# Patient Record
Sex: Female | Born: 1935 | Race: White | Hispanic: No | Smoking: Never smoker
Health system: Southern US, Community
[De-identification: ages and names within clinical notes are randomized; demographics above are authoritative.]

## PROBLEM LIST (undated history)

## (undated) DIAGNOSIS — I10 Essential (primary) hypertension: Secondary | ICD-10-CM

## (undated) DIAGNOSIS — C50919 Malignant neoplasm of unspecified site of unspecified female breast: Secondary | ICD-10-CM

## (undated) DIAGNOSIS — F329 Major depressive disorder, single episode, unspecified: Secondary | ICD-10-CM

## (undated) DIAGNOSIS — E049 Nontoxic goiter, unspecified: Secondary | ICD-10-CM

## (undated) DIAGNOSIS — K219 Gastro-esophageal reflux disease without esophagitis: Secondary | ICD-10-CM

## (undated) DIAGNOSIS — F32A Depression, unspecified: Secondary | ICD-10-CM

## (undated) DIAGNOSIS — M1990 Unspecified osteoarthritis, unspecified site: Secondary | ICD-10-CM

## (undated) DIAGNOSIS — I4891 Unspecified atrial fibrillation: Secondary | ICD-10-CM

## (undated) DIAGNOSIS — N952 Postmenopausal atrophic vaginitis: Secondary | ICD-10-CM

## (undated) DIAGNOSIS — N813 Complete uterovaginal prolapse: Secondary | ICD-10-CM

## (undated) DIAGNOSIS — G459 Transient cerebral ischemic attack, unspecified: Secondary | ICD-10-CM

## (undated) DIAGNOSIS — N8110 Cystocele, unspecified: Secondary | ICD-10-CM

## (undated) DIAGNOSIS — E785 Hyperlipidemia, unspecified: Secondary | ICD-10-CM

## (undated) DIAGNOSIS — E039 Hypothyroidism, unspecified: Secondary | ICD-10-CM

## (undated) DIAGNOSIS — N390 Urinary tract infection, site not specified: Secondary | ICD-10-CM

## (undated) DIAGNOSIS — IMO0001 Reserved for inherently not codable concepts without codable children: Secondary | ICD-10-CM

## (undated) DIAGNOSIS — M810 Age-related osteoporosis without current pathological fracture: Secondary | ICD-10-CM

## (undated) DIAGNOSIS — T8859XA Other complications of anesthesia, initial encounter: Secondary | ICD-10-CM

## (undated) DIAGNOSIS — J189 Pneumonia, unspecified organism: Secondary | ICD-10-CM

## (undated) DIAGNOSIS — Z923 Personal history of irradiation: Secondary | ICD-10-CM

## (undated) DIAGNOSIS — Z803 Family history of malignant neoplasm of breast: Secondary | ICD-10-CM

## (undated) DIAGNOSIS — K635 Polyp of colon: Secondary | ICD-10-CM

## (undated) DIAGNOSIS — I499 Cardiac arrhythmia, unspecified: Secondary | ICD-10-CM

## (undated) DIAGNOSIS — IMO0002 Reserved for concepts with insufficient information to code with codable children: Secondary | ICD-10-CM

## (undated) HISTORY — PX: BREAST SURGERY: SHX581

## (undated) HISTORY — DX: Polyp of colon: K63.5

## (undated) HISTORY — PX: CATARACT EXTRACTION W/ INTRAOCULAR LENS  IMPLANT, BILATERAL: SHX1307

## (undated) HISTORY — DX: Hyperlipidemia, unspecified: E78.5

## (undated) HISTORY — DX: Transient cerebral ischemic attack, unspecified: G45.9

## (undated) HISTORY — PX: APPENDECTOMY: SHX54

## (undated) HISTORY — DX: Complete uterovaginal prolapse: N81.3

## (undated) HISTORY — DX: Reserved for concepts with insufficient information to code with codable children: IMO0002

## (undated) HISTORY — DX: Urinary tract infection, site not specified: N39.0

## (undated) HISTORY — DX: Nontoxic goiter, unspecified: E04.9

## (undated) HISTORY — DX: Gastro-esophageal reflux disease without esophagitis: K21.9

## (undated) HISTORY — DX: Postmenopausal atrophic vaginitis: N95.2

## (undated) HISTORY — DX: Reserved for inherently not codable concepts without codable children: IMO0001

## (undated) HISTORY — DX: Cystocele, unspecified: N81.10

## (undated) HISTORY — PX: EYE SURGERY: SHX253

## (undated) HISTORY — DX: Family history of malignant neoplasm of breast: Z80.3

## (undated) HISTORY — PX: DILATION AND CURETTAGE OF UTERUS: SHX78

## (undated) HISTORY — PX: COLONOSCOPY: SHX174

---
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
## 2004-09-14 ENCOUNTER — Ambulatory Visit: Payer: Self-pay | Admitting: Internal Medicine

## 2004-10-17 DIAGNOSIS — Z923 Personal history of irradiation: Secondary | ICD-10-CM

## 2004-10-17 DIAGNOSIS — C50919 Malignant neoplasm of unspecified site of unspecified female breast: Secondary | ICD-10-CM

## 2004-10-17 HISTORY — DX: Malignant neoplasm of unspecified site of unspecified female breast: C50.919

## 2004-10-17 HISTORY — PX: BREAST BIOPSY: SHX20

## 2004-10-17 HISTORY — PX: BREAST LUMPECTOMY: SHX2

## 2004-10-17 HISTORY — DX: Personal history of irradiation: Z92.3

## 2004-12-15 ENCOUNTER — Ambulatory Visit: Payer: Self-pay | Admitting: Unknown Physician Specialty

## 2005-07-06 ENCOUNTER — Ambulatory Visit: Payer: Self-pay | Admitting: Internal Medicine

## 2005-07-14 ENCOUNTER — Ambulatory Visit: Payer: Self-pay | Admitting: Internal Medicine

## 2005-08-18 ENCOUNTER — Ambulatory Visit: Payer: Self-pay | Admitting: Internal Medicine

## 2005-09-06 ENCOUNTER — Ambulatory Visit: Payer: Self-pay | Admitting: Internal Medicine

## 2005-10-12 ENCOUNTER — Ambulatory Visit: Payer: Self-pay | Admitting: General Surgery

## 2005-10-27 ENCOUNTER — Ambulatory Visit: Payer: Self-pay | Admitting: Oncology

## 2005-11-02 ENCOUNTER — Ambulatory Visit: Payer: Self-pay | Admitting: General Surgery

## 2005-11-17 ENCOUNTER — Ambulatory Visit: Payer: Self-pay | Admitting: Radiation Oncology

## 2005-12-15 ENCOUNTER — Ambulatory Visit: Payer: Self-pay | Admitting: Radiation Oncology

## 2006-01-15 ENCOUNTER — Ambulatory Visit: Payer: Self-pay | Admitting: Radiation Oncology

## 2006-02-14 ENCOUNTER — Ambulatory Visit: Payer: Self-pay | Admitting: Radiation Oncology

## 2006-04-07 ENCOUNTER — Ambulatory Visit: Payer: Self-pay | Admitting: Unknown Physician Specialty

## 2006-04-10 ENCOUNTER — Ambulatory Visit: Payer: Self-pay | Admitting: General Surgery

## 2006-04-18 ENCOUNTER — Ambulatory Visit: Payer: Self-pay | Admitting: Unknown Physician Specialty

## 2006-04-26 ENCOUNTER — Ambulatory Visit: Payer: Self-pay | Admitting: Oncology

## 2006-07-05 ENCOUNTER — Ambulatory Visit: Payer: Self-pay | Admitting: Radiation Oncology

## 2006-09-22 ENCOUNTER — Ambulatory Visit: Payer: Self-pay | Admitting: Oncology

## 2006-09-29 ENCOUNTER — Ambulatory Visit: Payer: Self-pay | Admitting: Oncology

## 2006-10-17 ENCOUNTER — Ambulatory Visit: Payer: Self-pay | Admitting: Oncology

## 2006-11-17 ENCOUNTER — Ambulatory Visit: Payer: Self-pay | Admitting: Oncology

## 2007-03-18 ENCOUNTER — Ambulatory Visit: Payer: Self-pay | Admitting: Oncology

## 2007-03-23 ENCOUNTER — Ambulatory Visit: Payer: Self-pay | Admitting: General Surgery

## 2007-04-11 ENCOUNTER — Ambulatory Visit: Payer: Self-pay | Admitting: Oncology

## 2007-04-17 ENCOUNTER — Ambulatory Visit: Payer: Self-pay | Admitting: Oncology

## 2007-09-17 ENCOUNTER — Ambulatory Visit: Payer: Self-pay | Admitting: Oncology

## 2007-09-24 ENCOUNTER — Ambulatory Visit: Payer: Self-pay | Admitting: Oncology

## 2007-09-27 ENCOUNTER — Ambulatory Visit: Payer: Self-pay | Admitting: Oncology

## 2007-10-18 ENCOUNTER — Ambulatory Visit: Payer: Self-pay | Admitting: Oncology

## 2007-11-18 ENCOUNTER — Ambulatory Visit: Payer: Self-pay | Admitting: Oncology

## 2008-01-16 ENCOUNTER — Ambulatory Visit: Payer: Self-pay | Admitting: Oncology

## 2008-02-15 ENCOUNTER — Ambulatory Visit: Payer: Self-pay | Admitting: Oncology

## 2008-03-17 ENCOUNTER — Ambulatory Visit: Payer: Self-pay | Admitting: Oncology

## 2008-04-03 ENCOUNTER — Ambulatory Visit: Payer: Self-pay | Admitting: Oncology

## 2008-04-16 ENCOUNTER — Ambulatory Visit: Payer: Self-pay | Admitting: Oncology

## 2008-08-17 ENCOUNTER — Ambulatory Visit: Payer: Self-pay | Admitting: Oncology

## 2008-09-16 ENCOUNTER — Ambulatory Visit: Payer: Self-pay | Admitting: Oncology

## 2008-09-22 ENCOUNTER — Ambulatory Visit: Payer: Self-pay | Admitting: General Surgery

## 2008-10-02 ENCOUNTER — Ambulatory Visit: Payer: Self-pay | Admitting: Oncology

## 2008-10-17 ENCOUNTER — Ambulatory Visit: Payer: Self-pay | Admitting: Oncology

## 2009-01-12 ENCOUNTER — Ambulatory Visit: Payer: Self-pay | Admitting: Internal Medicine

## 2009-02-18 ENCOUNTER — Ambulatory Visit: Payer: Self-pay | Admitting: Unknown Physician Specialty

## 2009-03-17 ENCOUNTER — Ambulatory Visit: Payer: Self-pay | Admitting: Oncology

## 2009-04-06 ENCOUNTER — Ambulatory Visit: Payer: Self-pay | Admitting: Oncology

## 2009-04-16 ENCOUNTER — Ambulatory Visit: Payer: Self-pay | Admitting: Oncology

## 2009-09-16 ENCOUNTER — Ambulatory Visit: Payer: Self-pay | Admitting: Oncology

## 2009-09-23 ENCOUNTER — Ambulatory Visit: Payer: Self-pay | Admitting: Internal Medicine

## 2009-09-28 ENCOUNTER — Ambulatory Visit: Payer: Self-pay | Admitting: Oncology

## 2009-10-17 ENCOUNTER — Ambulatory Visit: Payer: Self-pay | Admitting: Oncology

## 2010-03-17 ENCOUNTER — Ambulatory Visit: Payer: Self-pay | Admitting: Oncology

## 2010-03-22 ENCOUNTER — Ambulatory Visit: Payer: Self-pay | Admitting: Oncology

## 2010-04-16 ENCOUNTER — Ambulatory Visit: Payer: Self-pay | Admitting: Oncology

## 2010-09-27 ENCOUNTER — Ambulatory Visit: Payer: Self-pay | Admitting: Internal Medicine

## 2010-12-20 ENCOUNTER — Ambulatory Visit: Payer: Self-pay | Admitting: Oncology

## 2011-01-16 ENCOUNTER — Ambulatory Visit: Payer: Self-pay | Admitting: Oncology

## 2011-07-21 ENCOUNTER — Ambulatory Visit: Payer: Self-pay | Admitting: Oncology

## 2011-07-22 LAB — CANCER ANTIGEN 27.29: CA 27.29: <3.5 U/mL (ref 0.0–38.6)

## 2011-08-18 ENCOUNTER — Ambulatory Visit: Payer: Self-pay | Admitting: Oncology

## 2011-10-31 ENCOUNTER — Ambulatory Visit: Payer: Self-pay | Admitting: Oncology

## 2011-11-15 ENCOUNTER — Ambulatory Visit: Payer: Self-pay | Admitting: Internal Medicine

## 2012-01-19 ENCOUNTER — Ambulatory Visit: Payer: Self-pay | Admitting: Oncology

## 2012-01-19 LAB — COMPREHENSIVE METABOLIC PANEL
Albumin: 3.5 g/dL (ref 3.4–5.0)
Alkaline Phosphatase: 89 U/L (ref 50–136)
BUN: 14 mg/dL (ref 7–18)
Bilirubin,Total: 0.3 mg/dL (ref 0.2–1.0)
Calcium, Total: 8.6 mg/dL (ref 8.5–10.1)
Creatinine: 1.01 mg/dL (ref 0.60–1.30)
EGFR (African American): >60
EGFR (Non-African Amer.): 56 — ABNORMAL LOW
Osmolality: 284 (ref 275–301)
Potassium: 4.5 mmol/L (ref 3.5–5.1)
SGOT(AST): 14 U/L — ABNORMAL LOW (ref 15–37)
SGPT (ALT): 20 U/L
Sodium: 143 mmol/L (ref 136–145)
Total Protein: 7.2 g/dL (ref 6.4–8.2)

## 2012-01-19 LAB — CBC CANCER CENTER
Basophil %: 0.7 %
Eosinophil #: 0.3 x10 3/mm (ref 0.0–0.7)
Eosinophil %: 5.2 %
Lymphocyte #: 1.3 x10 3/mm (ref 1.0–3.6)
Lymphocyte %: 27.2 %
MCH: 32.4 pg (ref 26.0–34.0)
MCHC: 33.7 g/dL (ref 32.0–36.0)
RDW: 14.2 % (ref 11.5–14.5)
WBC: 4.8 x10 3/mm (ref 3.6–11.0)

## 2012-01-20 LAB — CANCER ANTIGEN 27.29: CA 27.29: 4.1 U/mL (ref 0.0–38.6)

## 2012-02-15 ENCOUNTER — Ambulatory Visit: Payer: Self-pay | Admitting: Oncology

## 2012-11-09 ENCOUNTER — Ambulatory Visit: Payer: Self-pay

## 2012-12-25 ENCOUNTER — Ambulatory Visit: Payer: Self-pay | Admitting: General Practice

## 2012-12-25 ENCOUNTER — Emergency Department: Payer: Self-pay | Admitting: Emergency Medicine

## 2013-04-14 ENCOUNTER — Observation Stay: Payer: Self-pay | Admitting: Internal Medicine

## 2013-04-14 LAB — CBC
HCT: 44.2 % (ref 35.0–47.0)
HGB: 14.9 g/dL (ref 12.0–16.0)
MCH: 31.6 pg (ref 26.0–34.0)
MCHC: 33.8 g/dL (ref 32.0–36.0)
MCV: 94 fL (ref 80–100)
Platelet: 207 10*3/uL (ref 150–440)
RBC: 4.72 10*6/uL (ref 3.80–5.20)
RDW: 14 % (ref 11.5–14.5)
WBC: 6.6 10*3/uL (ref 3.6–11.0)

## 2013-04-14 LAB — COMPREHENSIVE METABOLIC PANEL
Albumin: 3.4 g/dL (ref 3.4–5.0)
Alkaline Phosphatase: 93 U/L (ref 50–136)
Anion Gap: 5 — ABNORMAL LOW (ref 7–16)
BUN: 15 mg/dL (ref 7–18)
Bilirubin,Total: 0.4 mg/dL (ref 0.2–1.0)
Calcium, Total: 9.5 mg/dL (ref 8.5–10.1)
Chloride: 108 mmol/L — ABNORMAL HIGH (ref 98–107)
SGPT (ALT): 16 U/L (ref 12–78)

## 2013-04-14 LAB — PROTIME-INR
INR: 0.9
Prothrombin Time: 12.7 secs (ref 11.5–14.7)

## 2013-04-14 LAB — TROPONIN I: Troponin-I: <0.02 ng/mL

## 2013-04-14 LAB — APTT: Activated PTT: 31.7 secs (ref 23.6–35.9)

## 2013-04-14 LAB — MAGNESIUM: Magnesium: 1.7 mg/dL — ABNORMAL LOW

## 2013-04-15 LAB — CBC WITH DIFFERENTIAL/PLATELET
Basophil #: 0.1 10*3/uL (ref 0.0–0.1)
Eosinophil %: 6.3 %
HGB: 11.9 g/dL — ABNORMAL LOW (ref 12.0–16.0)
Lymphocyte %: 42.5 %
MCH: 31.2 pg (ref 26.0–34.0)
MCV: 94 fL (ref 80–100)
Monocyte #: 0.4 x10 3/mm (ref 0.2–0.9)
Neutrophil #: 2 10*3/uL (ref 1.4–6.5)
RBC: 3.8 10*6/uL (ref 3.80–5.20)
RDW: 14.4 % (ref 11.5–14.5)
WBC: 4.7 10*3/uL (ref 3.6–11.0)

## 2013-04-15 LAB — BASIC METABOLIC PANEL
Calcium, Total: 7.9 mg/dL — ABNORMAL LOW (ref 8.5–10.1)
Co2: 25 mmol/L (ref 21–32)
EGFR (African American): >60
Potassium: 4 mmol/L (ref 3.5–5.1)
Sodium: 145 mmol/L (ref 136–145)

## 2013-04-15 LAB — LIPID PANEL
Cholesterol: 137 mg/dL (ref 0–200)
HDL Cholesterol: 38 mg/dL — ABNORMAL LOW (ref 40–60)
Ldl Cholesterol, Calc: 82 mg/dL (ref 0–100)
Triglycerides: 85 mg/dL (ref 0–200)
VLDL Cholesterol, Calc: 17 mg/dL (ref 5–40)

## 2013-04-15 LAB — CK TOTAL AND CKMB (NOT AT ARMC): CK, Total: 30 U/L (ref 21–215)

## 2013-11-21 ENCOUNTER — Ambulatory Visit: Payer: Self-pay

## 2014-02-28 DIAGNOSIS — Z8719 Personal history of other diseases of the digestive system: Secondary | ICD-10-CM | POA: Insufficient documentation

## 2014-04-01 DIAGNOSIS — M810 Age-related osteoporosis without current pathological fracture: Secondary | ICD-10-CM | POA: Insufficient documentation

## 2014-04-01 DIAGNOSIS — E010 Iodine-deficiency related diffuse (endemic) goiter: Secondary | ICD-10-CM | POA: Insufficient documentation

## 2014-04-01 DIAGNOSIS — C50919 Malignant neoplasm of unspecified site of unspecified female breast: Secondary | ICD-10-CM | POA: Insufficient documentation

## 2014-04-01 DIAGNOSIS — K219 Gastro-esophageal reflux disease without esophagitis: Secondary | ICD-10-CM | POA: Insufficient documentation

## 2014-04-01 DIAGNOSIS — E785 Hyperlipidemia, unspecified: Secondary | ICD-10-CM | POA: Insufficient documentation

## 2014-04-01 DIAGNOSIS — G939 Disorder of brain, unspecified: Secondary | ICD-10-CM | POA: Insufficient documentation

## 2014-04-01 DIAGNOSIS — I6782 Cerebral ischemia: Secondary | ICD-10-CM | POA: Insufficient documentation

## 2014-04-03 ENCOUNTER — Ambulatory Visit: Payer: Self-pay | Admitting: Unknown Physician Specialty

## 2014-04-08 LAB — PATHOLOGY REPORT

## 2014-11-24 ENCOUNTER — Ambulatory Visit: Payer: Self-pay | Admitting: Internal Medicine

## 2015-02-06 NOTE — H&P (Signed)
PATIENT NAME:  Sheryl Michael, Sheryl Michael MR#:  [id number] DATE OF BIRTH:  [birth year]  DATE OF ADMISSION:  04/14/2013  REFERRING PHYSICIAN:  Dr. Renard Hamper.    PRIMARY CARE PHYSICIAN: Dr.  Ola Spurr.   CHIEF COMPLAINT: Irregular heart rhythm.  HISTORY OF PRESENT ILLNESS:  The patient is a pleasant 81 year old female with hypothyroidism, who presents with above chief complaint. The patient stated that couple of weeks ago she had bronchitis symptoms with runny nose, itchy eyes, watery and productive cough and was on antibiotics for a week or so. Most of the symptoms are gone, but she still has a cough with occasional whitish sputum. She was doing fine until last night, where she developed a racy heart with palpitations and some chest pressure. The pressure did not radiate. The symptoms persisted and this morning, she came in. She was noted to be in atrial flutter with RVR, rate of 130s with variable AV block and some PVCs. The patient was given diltiazem 10 mg IV x 1 and then required another 5 mg of IV x 1 and  drip was ordered, but the patient converted to normal sinus rhythm. The patient has  no chest pressure. Of note, the patient also had positive for D-dimer and had a CT PE protocol done, which was negative. Hospitalist services were contacted for further evaluation and management. Her heart rate in the high 60s and low 70s and pressures were 110s.   PAST MEDICAL HISTORY: Hypothyroidism, history of breast cancer in remission.   PAST SURGICAL HISTORY: Hysterectomy.   FAMILY HISTORY: Breast cancer, a sister with myocardial infarction, another sister with diabetes and cancer.   SOCIAL HISTORY: No tobacco, alcohol or drug use.   OUTPATIENT MEDICATIONS: She takes Synthroid 50 mcg daily, Caltrate 600+ D, 1 tablet once a day.   ALLERGIES: CODEINE AND PENICILLIN.   REVIEW OF SYSTEMS:  CONSTITUTIONAL: No fever, had some fatigue and weakness earlier this morning.  EYES: No blurry vision or double vision.  ENT: No  tinnitus, hearing loss or discharge.  RESPIRATORY: Positive for a cough, which is biting with some whitish sputum at times. No wheezing and had some shortness of breath today.  CARDIOVASCULAR: Had chest  pressure. No orthopnea. Had a bout of possible palpitations three weeks ago, this resolve. No high blood pressure. No abdominal pain.  GASTROINTESTINAL: No nausea, vomiting, diarrhea, hematemesis, melena or ulcers.  GENITOURINARY: Denies dysuria, hematuria, or frequency.   HEMATOLOGY/LYMPHATICS: No anemia or easy bruising.  SKIN: No rashes.  MUSCULOSKELETAL: Denies arthritis or gout.  [place]:  Denies numbness or weakness. Has history of transient ischemic attack last year while she was vacationing in [place].  PSYCHIATRIC: No anxiety or insomnia.   PHYSICAL EXAMINATION:  VITAL SIGNS:  Temperature on arrival was not recorded, but pulse rate was 138, respiratory rate 22, blood pressure 127/62, oxygen saturation 96% on room air.  GENERAL: The patient is a Caucasian female lying in bed in no obvious distress, talking in full sentences.  HEENT: Normocephalic, atraumatic. Pupils are equal and reactive. Anicteric sclerae. Extraocular muscles intact. Moist mucous membranes.  NECK: Supple. No thyroid tenderness. No cervical lymphadenopathy. No JVD.  CARDIOVASCULAR: S1, S2 irregularly irregular. No murmurs, rubs or gallops.  LUNGS: Clear to auscultation without wheezing, rhonchi or rales.  ABDOMEN: Soft, nontender, nondistended. Positive bowel sounds in all quadrants.  EXTREMITIES: No significant lower extremity edema, but there are some varicosities bilateral lower extremities.  SKIN: No obvious rashes.   NEUROLOGIC: Cranial nerves II through XII grossly intact. Strength  is 5/5 in all extremities.  Sensation intact to light touch.  PSYCHIATRIC: Awake, alert, oriented x 3. Pleasant, cooperative.   LABORATORY AND RADIOLOGICAL DATA: D-dimer positive at 1.51, INR 0.9. WBCs at 6.6, hemoglobin 14.9,  platelets 207, TSH 2.5. Troponin negative. LFTs within normal limits. Magnesium 1.7, BUN 15, creatinine 1.02, sodium 142, potassium 4.   First EKG as above with a rate of 136 some nonspecific interventricular block. A second EKG after patient converted shows sinus tach, and under monitor, she was a normal sinus rhythm, rate is 71.   ASSESSMENT AND PLAN: We have a 81 year old female with a history of hypothyroidism with possible history of transient ischemic attack last year and an episode of palpitations about 3 weeks ago, presents with atrial flutter with rapid ventricular response, currently back to sinus rhythm. The patient likely has paroxysmal flutter/fibrillation. She has possibly experienced a transient ischemic attack last year involving the right upper extremity, which initially she did not state. The patient did convert with diltiazem IV x 2 doses and at this point, sinus rate 60s to 70s and blood pressure in the 110s. Admit her to telemetry for observation as she has converted. Given the heart rate on the lower side as well with blood pressure on lower side, I would consider adding low-dose beta blocker, if it tolerates later. I would start the patient on a full dose aspirin as patient deferred full anticoagulation at this point. I urged her that she had a history of transient ischemic attack in the past as well as palpitations, a few weeks ago, which could have been flutter/fibrillation slash. However, she will think about it. At this point, I would start the patient on a full dose aspirin and consider beta blocker lower dose  if her blood pressure tolerates. Obtain serial echocardiograms obtain serial EKGs and obtain a cardiology consult. Her TSH is within normal limits. She had a positive D-dimer, but has no pulmonary embolus per pulmonary embolus protocol CT. She has had a bout of bronchitis, which is biting and I do not think that is the cause. Her magnesium is on the lower side, and it has been  repleted. I will check in the morning. But continue the TSH, start her on heparin for deep vein thrombosis prophylaxis.   CODE STATUS:  The patient is a full code.   TOTAL TIME SPENT: 45 minutes.     ____________________________ Vivien Presto, MD sa:cc D: 04/14/2013 14:01:29 ET T: 04/14/2013 15:41:47 ET JOB#: [id number]  cc: Vivien Presto, MD, <Dictator> Cheral Marker. Ola Spurr, MD Karel Jarvis [facility] MD ELECTRONICALLY SIGNED 04/26/2013 15:05

## 2015-02-06 NOTE — Discharge Summary (Signed)
PATIENT NAME:  Sheryl Michael, Sheryl Michael MR#:  [id number] DATE OF BIRTH:  03/24/1931  DATE OF ADMISSION:  04/14/2013 DATE OF DISCHARGE:  04/15/2013  DISCHARGE DIAGNOSES: 1.  Atrial flutter.  2.  Hypothyroidism.  3.  History of breast cancer.   DISCHARGE MEDICATIONS: 1.  Synthroid 50 mcg daily.  2.  Aspirin 81 mg daily.  3.  Bisoprolol 2.5 mg daily.   REASON FOR ADMISSION: A 81 year old female who presents with a-flutter. Please see H and P for HPI, past medical history and physical exam.   HOSPITAL COURSE: The patient was admitted, converted into normal sinus rhythm with diltiazem IV x 1. She remained in normal rhythm. She is asymptomatic.  Chest CT negative for PE. Cardiac enzymes normal. It appears that she got volume depleted while working out in the garden and that is likely what triggered it. She has had this in the past. She will be on bisoprolol for now.  Ultimately could wean that off.  Taught Valsalva maneuver.  Echo pending. She has no interested in Coumadin or other anticoagulant therapy and thus agrees to take an aspirin daily. Follow up with Dr. Ola Spurr in 2 weeks.  ____________________________ Rusty Aus, MD mfm:sb D: 04/15/2013 07:37:17 ET T: 04/15/2013 08:05:50 ET JOB#: [id number]  cc: Rusty Aus, MD, <Dictator> Cyler Kappes Roselee Culver MD ELECTRONICALLY SIGNED 04/15/2013 8:15

## 2015-08-17 DIAGNOSIS — Z87898 Personal history of other specified conditions: Secondary | ICD-10-CM | POA: Insufficient documentation

## 2015-08-17 DIAGNOSIS — Z85828 Personal history of other malignant neoplasm of skin: Secondary | ICD-10-CM | POA: Insufficient documentation

## 2015-10-07 ENCOUNTER — Encounter: Payer: Self-pay | Admitting: Obstetrics and Gynecology

## 2015-10-07 ENCOUNTER — Ambulatory Visit (INDEPENDENT_AMBULATORY_CARE_PROVIDER_SITE_OTHER): Payer: Medicare Other | Admitting: Obstetrics and Gynecology

## 2015-10-07 VITALS — BP 90/53 | HR 80 | Ht 60.0 in | Wt 116.7 lb

## 2015-10-07 DIAGNOSIS — K222 Esophageal obstruction: Secondary | ICD-10-CM | POA: Insufficient documentation

## 2015-10-07 DIAGNOSIS — K635 Polyp of colon: Secondary | ICD-10-CM | POA: Insufficient documentation

## 2015-10-07 DIAGNOSIS — N813 Complete uterovaginal prolapse: Secondary | ICD-10-CM

## 2015-10-07 DIAGNOSIS — N8110 Cystocele, unspecified: Secondary | ICD-10-CM | POA: Diagnosis not present

## 2015-10-07 DIAGNOSIS — IMO0002 Reserved for concepts with insufficient information to code with codable children: Secondary | ICD-10-CM

## 2015-10-07 DIAGNOSIS — Q393 Congenital stenosis and stricture of esophagus: Secondary | ICD-10-CM | POA: Insufficient documentation

## 2015-10-07 NOTE — Progress Notes (Signed)
Chief complaint: 1.  Pelvic organ prolapse.  The patient is an 81 year old white female, menopausal, using no hormone replacement therapy, para 3003, status post spontaneous vaginal delivery 3, who has known third-degree cystocele and uterine procidentia.  Last year.  Pessary trial was attempted but unsuccessful; 2 done at pessaries and a #3 ring with support were not able to be maintained.  Gellhorn pessary was attempted as well, but was not successful due to patient discomfort and intolerance with insertion.  Patient is now interested in surgical repair if possible.  Surgical risk factors include history of TIAs/.  Patient has no significant cardiovascular disease.She does have a history of cancer.  Past medical history, past surgical history, problem list, medications, and allergies are reviewed.  OBJECTIVE: BP 90/53 mmHg  Pulse 80  Ht 5' (1.524 m)  Wt 116 lb 11.2 oz (52.935 kg)  BMI 22.79 kg/m2 Pleasant white female in no distress.  Alert and oriented. Abdomen: Soft, nontender, without organomegaly. Pelvic exam: External genitalia-normal BUS-normal. Vagina-third degree cystocele; no rectocele. Cervix-no motion tenderness; third-degree prolapse. Uterus-small, mobile, nontender, mid position, procidentia noted with Valsalva. Bimanual-no adnexal masses or tenderness. Rectovaginal-external exam normal.  ASSESSMENT: 1.  Uterine procidentia. 2.  Cystocele, third-degree. 3.  Failed extensive pessary trial.  PLAN: 1.  Recommend [facility] BSO with anterior colporrhaphy. 2.  Surgical comorbidity Risks were reviewed, Including DVT, section, cardiovascular risks, general anesthesia risks with neurologic function, etc.  Multiple questions were answered.  Patient understands she may have an option of having general anesthesia versus spinal. 3.  Patient will discuss surgery with family and will contact us if she would desires to proceed with surgical intervention.  Alanda Slim Josphine Laffey, MD  A  total of 25 minutes were spent face-to-face with the patient during this encounter and over half of that time involved counseling and coordination of care.  Note: This dictation was prepared with Dragon dictation along with smaller phrase technology. Any transcriptional errors that result from this process are unintentional.

## 2015-10-07 NOTE — Patient Instructions (Signed)
1.  Patient is to call us when she would like to schedule surgery.  Recommendation is for [facility] BSO with anterior colporrhaphy.  No mesh will be used in the surgical repair.

## 2015-10-20 ENCOUNTER — Telehealth: Payer: Self-pay | Admitting: Obstetrics and Gynecology

## 2015-10-20 NOTE — Telephone Encounter (Signed)
Patient called wanting to speak with you regarding something she states she forgot to discuss something at her last appointment. She can be reached at [phone]

## 2015-10-21 NOTE — Telephone Encounter (Signed)
Pt wanted her daughter Georgeanna Lea) to contact office with  Several questions about her surgery. Advised pt I would make a note in her chart that it was ok to discuss her information with daughter. Neoma Laming will call me back with her questions. Will send to mad if I can't answer.

## 2015-11-10 ENCOUNTER — Telehealth: Payer: Self-pay | Admitting: Obstetrics and Gynecology

## 2015-11-10 NOTE — Telephone Encounter (Signed)
Her mother is supposed to be having surgery and her daughter wants to ask some questions. She is coming to stay with her sister and wanted to ask a few things. She is having a hysterectomy and bladder sling.

## 2015-11-11 ENCOUNTER — Telehealth: Payer: Self-pay | Admitting: Obstetrics and Gynecology

## 2015-11-11 ENCOUNTER — Other Ambulatory Visit: Payer: Self-pay | Admitting: Internal Medicine

## 2015-11-11 DIAGNOSIS — Z1231 Encounter for screening mammogram for malignant neoplasm of breast: Secondary | ICD-10-CM

## 2015-11-11 NOTE — Telephone Encounter (Signed)
Pt said she was ready to set her surgery up and wanted to talk to you first.

## 2015-11-11 NOTE — Telephone Encounter (Signed)
Answers daughters questions. Advised to contact Kristal Cox to set up surgery date.

## 2015-11-17 ENCOUNTER — Telehealth: Payer: Self-pay | Admitting: Obstetrics and Gynecology

## 2015-11-17 NOTE — Telephone Encounter (Signed)
PT CALLED AND SHE IS REQUESTING A CALL BACK FROM YOU SHE WOULD LIKE TO TALK TO YOU ABOUT SOMETHING.

## 2015-11-18 NOTE — Telephone Encounter (Signed)
Pt wants to confirm her surgery is scheduled for 12/21/15- her daughter lives on [address] and she is booking her flight. Aware I will send message to [facility] to confirm.

## 2015-11-19 NOTE — Telephone Encounter (Signed)
Spoke with patient and gave her surgery date as well as pre op date and time. K[facility]

## 2015-11-26 ENCOUNTER — Other Ambulatory Visit: Payer: Self-pay | Admitting: Internal Medicine

## 2015-11-26 ENCOUNTER — Ambulatory Visit
Admission: RE | Admit: 2015-11-26 | Discharge: 2015-11-26 | Disposition: A | Discharge status: Home / Self Care | Payer: Medicare Other | Source: Ambulatory Visit | Attending: Internal Medicine | Admitting: Internal Medicine

## 2015-11-26 DIAGNOSIS — Z1231 Encounter for screening mammogram for malignant neoplasm of breast: Secondary | ICD-10-CM

## 2015-11-26 HISTORY — DX: Malignant neoplasm of unspecified site of unspecified female breast: C50.919

## 2015-12-15 ENCOUNTER — Encounter
Admission: RE | Admit: 2015-12-15 | Discharge: 2015-12-15 | Disposition: A | Discharge status: Home / Self Care | Payer: Medicare Other | Source: Ambulatory Visit | Attending: Obstetrics and Gynecology | Admitting: Obstetrics and Gynecology

## 2015-12-15 ENCOUNTER — Encounter: Payer: Self-pay | Admitting: Obstetrics and Gynecology

## 2015-12-15 ENCOUNTER — Ambulatory Visit (INDEPENDENT_AMBULATORY_CARE_PROVIDER_SITE_OTHER): Payer: Medicare Other | Admitting: Obstetrics and Gynecology

## 2015-12-15 VITALS — BP 139/62 | HR 57 | Ht 61.0 in | Wt 121.8 lb

## 2015-12-15 DIAGNOSIS — Z01818 Encounter for other preprocedural examination: Secondary | ICD-10-CM

## 2015-12-15 DIAGNOSIS — Z01810 Encounter for preprocedural cardiovascular examination: Secondary | ICD-10-CM | POA: Insufficient documentation

## 2015-12-15 DIAGNOSIS — IMO0002 Reserved for concepts with insufficient information to code with codable children: Secondary | ICD-10-CM

## 2015-12-15 DIAGNOSIS — N813 Complete uterovaginal prolapse: Secondary | ICD-10-CM

## 2015-12-15 DIAGNOSIS — Z01812 Encounter for preprocedural laboratory examination: Secondary | ICD-10-CM | POA: Insufficient documentation

## 2015-12-15 DIAGNOSIS — N8110 Cystocele, unspecified: Secondary | ICD-10-CM

## 2015-12-15 HISTORY — DX: Major depressive disorder, single episode, unspecified: F32.9

## 2015-12-15 HISTORY — DX: Depression, unspecified: F32.A

## 2015-12-15 LAB — ABO/RH: ABO/RH(D): B POS

## 2015-12-15 LAB — CBC WITH DIFFERENTIAL/PLATELET
BASOS PCT: 3 %
Basophils Absolute: 0.1 10*3/uL (ref 0–0.1)
EOS ABS: 0.2 10*3/uL (ref 0–0.7)
EOS PCT: 5 %
HCT: 43.7 % (ref 35.0–47.0)
Hemoglobin: 14.5 g/dL (ref 12.0–16.0)
LYMPHS ABS: 1 10*3/uL (ref 1.0–3.6)
Lymphocytes Relative: 23 %
MCH: 31.4 pg (ref 26.0–34.0)
MCHC: 33.1 g/dL (ref 32.0–36.0)
MCV: 95.1 fL (ref 80.0–100.0)
MONO ABS: 0.3 10*3/uL (ref 0.2–0.9)
MONOS PCT: 7 %
Neutro Abs: 2.7 10*3/uL (ref 1.4–6.5)
Neutrophils Relative %: 62 %
Platelets: 189 10*3/uL (ref 150–440)
RBC: 4.6 MIL/uL (ref 3.80–5.20)
RDW: 13.8 % (ref 11.5–14.5)
WBC: 4.4 10*3/uL (ref 3.6–11.0)

## 2015-12-15 LAB — BASIC METABOLIC PANEL
Anion gap: 8 (ref 5–15)
BUN: 15 mg/dL (ref 6–20)
CALCIUM: 9.5 mg/dL (ref 8.9–10.3)
CO2: 29 mmol/L (ref 22–32)
CREATININE: 0.85 mg/dL (ref 0.44–1.00)
Chloride: 104 mmol/L (ref 101–111)
GFR calc non Af Amer: >60 mL/min (ref >60)
GLUCOSE: 81 mg/dL (ref 65–99)
Potassium: 4.2 mmol/L (ref 3.5–5.1)
Sodium: 141 mmol/L (ref 135–145)

## 2015-12-15 LAB — RAPID HIV SCREEN (HIV 1/2 AB+AG)
HIV 1/2 ANTIBODIES: NONREACTIVE
HIV-1 P24 ANTIGEN - HIV24: NONREACTIVE

## 2015-12-15 LAB — TYPE AND SCREEN
ABO/RH(D): B POS
Antibody Screen: NEGATIVE

## 2015-12-15 NOTE — Patient Instructions (Signed)
  Your procedure is scheduled on: 12/21/15 Mon Report to Day Surgery.2nd floor medical  To find out your arrival time please call [phone] between 1PM - 3PM on 12/18/15 Fri  Remember: Instructions that are not followed completely may result in serious medical risk, up to and including death, or upon the discretion of your surgeon and anesthesiologist your surgery may need to be rescheduled.    __x__ 1. Do not eat food or drink liquids after midnight. No gum chewing or hard candies.     ____ 2. No Alcohol for 24 hours before or after surgery.   ____ 3. Bring all medications with you on the day of surgery if instructed.    _x___ 4. Notify your doctor if there is any change in your medical condition     (cold, fever, infections).     Do not wear jewelry, make-up, hairpins, clips or nail polish.  Do not wear lotions, powders, or perfumes. You may wear deodorant.  Do not shave 48 hours prior to surgery. Men may shave face and neck.  Do not bring valuables to the hospital.    [facility] is not responsible for any belongings or valuables.               Contacts, dentures or bridgework may not be worn into surgery.  Leave your suitcase in the car. After surgery it may be brought to your room.  For patients admitted to the hospital, discharge time is determined by your                treatment team.   Patients discharged the day of surgery will not be allowed to drive home.   Please read over the following fact sheets that you were given:      _x___ Take these medicines the morning of surgery with A SIP OF WATER:    1. escitalopram (LEXAPRO) 10 MG tablet  2. levothyroxine (SYNTHROID, LEVOTHROID) 50 MCG tablet  3. omeprazole (PRILOSEC) 20 MG capsule  4.  5.  6.  ____ Fleet Enema (as directed)   __x__ Use CHG Soap as directed  ____ Use inhalers on the day of surgery  ____ Stop metformin 2 days prior to surgery    ____ Take 1/2 of usual insulin dose the night before surgery and  none on the morning of surgery.   ____ Stop Coumadin/Plavix/aspirin on   __x__ Stop Anti-inflammatories on No ibuprofen or aspirin, etc 1 week before surgery may use Tylenol as needed   ____ Stop supplements until after surgery.    ____ Bring C-Pap to the hospital.

## 2015-12-15 NOTE — H&P (Signed)
Subjective: PREOPERATIVE HISTORY AND PHYSICAL    Date of surgery: 12/21/2015 Diagnoses: 1. Uterine procidentia 2. Third-degree cystocele   Patient is a 82 y.o. P3003.female scheduled for [facility] BSO with anterior colporrhaphy, and cystoscopy. Indications for procedure are uterine procidentia with third-degree cystocele Patient does not have incontinence symptoms. Multiple pessaries unsuccessful in managing symptomatology.   Pertinent Gynecological History: Menses: post-menopausal    Menstrual History: OB History    No data available      Menarche age:NA  No LMP recorded. Patient is postmenopausal.    Past Medical History  Diagnosis Date  . Recurrent UTI   . Female bladder prolapse   . TIA (transient ischemic attack)   . Goiter   . Reflux   . Colon polyp   . Cystocele   . Vaginal atrophy   . Procidentia of uterus   . Hyperlipemia   . Breast cancer [facility]) 2006    right breast ca with lumpectomy and rad tx    Past Surgical History  Procedure Laterality Date  . Dilation and curettage of uterus    . Appendectomy    . Breast surgery Left     OB History  No data available  P3003 SVD x 3  Social History   Social History  . Marital Status: Widowed    Spouse Name: N/A  . Number of Children: N/A  . Years of Education: N/A   Social History Main Topics  . Smoking status: Never Smoker   . Smokeless tobacco: None  . Alcohol Use: No  . Drug Use: No  . Sexual Activity: No   Other Topics Concern  . None   Social History Narrative    Family History  Problem Relation Age of Onset  . Diabetes Sister   . Breast cancer Sister     late 51's  . Diabetes Brother      (Not in a hospital admission)  Allergies  Allergen Reactions  . Iodinated Diagnostic Agents Anaphylaxis  . Codeine Nausea And Vomiting  . Penicillin V Potassium Other (See Comments)  . Phenobarbital Other (See Comments)    Review of Systems Constitutional: No recent  fever/chills/sweats Respiratory: No recent cough/bronchitis Cardiovascular: No chest pain Gastrointestinal: No recent nausea/vomiting/diarrhea Genitourinary: No UTI symptoms Hematologic/lymphatic:No history of coagulopathy or recent blood thinner use    Objective:    BP 139/62 mmHg  Pulse 57  Ht 5\' 1"  (1.549 m)  Wt 121 lb 12.8 oz (55.248 kg)  BMI 23.03 kg/m2  General:   Normal  Skin:   normal  HEENT:  Normal  Neck:  Supple without Adenopathy or Thyromegaly  Lungs:   Heart:              Breasts:   Abdomen:  Pelvis:  M/S   Extremeties:  Neuro:    clear to auscultation bilaterally   Normal without murmur   Not Examined   soft, non-tender; bowel sounds normal; no masses,  no organomegaly   Exam deferred to OR;   Pelvic exam: (10/07/2015) External genitalia-normal BUS-normal. Vagina-third degree cystocele; no rectocele. Cervix-no motion tenderness; third-degree prolapse. Uterus-small, mobile, nontender, mid position, procidentia noted with Valsalva. Bimanual-no adnexal masses or tenderness. Rectovaginal-external exam normal.    No CVAT  Warm/Dry   Normal          Assessment:    1. Uterine procidentia 2. Third degree cystocele 3. Failed extensive pessary trial   Plan:     1. [facility] BSO with anterior colporrhaphy and cystoscopy  Preoperative  counseling: The patient is to undergo [facility] BSO with anterior colporrhaphy and cystoscopy for management of uterine procidentia with third-degree cystocele. She is understanding of the planned procedures and is aware of and is accepting of all surgical risks which include but are not limited to bleeding, infection, pelvic organ injury with need for repair, blood clot disorders, anesthesia risks, etc. All questions have been answered. Informed consent is given. Patient is ready and willing to proceed with surgery as scheduled.  Brayton Mars, MD  Note: This dictation was prepared with Dragon dictation along with smaller  phrase technology. Any transcriptional errors that result from this process are unintentional.

## 2015-12-15 NOTE — Patient Instructions (Signed)
1. Return in 1 week after surgery for postop check 

## 2015-12-15 NOTE — Progress Notes (Signed)
Subjective: PREOPERATIVE HISTORY AND PHYSICAL    Date of surgery: 12/21/2015 Diagnoses: 1. Uterine procidentia 2. Third-degree cystocele   Patient is a 82 y.o. P3003.female scheduled for [facility] BSO with anterior colporrhaphy, and cystoscopy. Indications for procedure are uterine procidentia with third-degree cystocele Patient does not have incontinence symptoms. Multiple pessaries unsuccessful in managing symptomatology.   Pertinent Gynecological History: Menses: post-menopausal    Menstrual History: OB History    No data available      Menarche age:NA  No LMP recorded. Patient is postmenopausal.    Past Medical History  Diagnosis Date  . Recurrent UTI   . Female bladder prolapse   . TIA (transient ischemic attack)   . Goiter   . Reflux   . Colon polyp   . Cystocele   . Vaginal atrophy   . Procidentia of uterus   . Hyperlipemia   . Breast cancer [facility]) 2006    right breast ca with lumpectomy and rad tx    Past Surgical History  Procedure Laterality Date  . Dilation and curettage of uterus    . Appendectomy    . Breast surgery Left     OB History  No data available  P3003 SVD x 3  Social History   Social History  . Marital Status: Widowed    Spouse Name: N/A  . Number of Children: N/A  . Years of Education: N/A   Social History Main Topics  . Smoking status: Never Smoker   . Smokeless tobacco: None  . Alcohol Use: No  . Drug Use: No  . Sexual Activity: No   Other Topics Concern  . None   Social History Narrative    Family History  Problem Relation Age of Onset  . Diabetes Sister   . Breast cancer Sister     late 17's  . Diabetes Brother      (Not in a hospital admission)  Allergies  Allergen Reactions  . Iodinated Diagnostic Agents Anaphylaxis  . Codeine Nausea And Vomiting  . Penicillin V Potassium Other (See Comments)  . Phenobarbital Other (See Comments)    Review of Systems Constitutional: No recent  fever/chills/sweats Respiratory: No recent cough/bronchitis Cardiovascular: No chest pain Gastrointestinal: No recent nausea/vomiting/diarrhea Genitourinary: No UTI symptoms Hematologic/lymphatic:No history of coagulopathy or recent blood thinner use    Objective:    BP 139/62 mmHg  Pulse 57  Ht 5\' 1"  (1.549 m)  Wt 121 lb 12.8 oz (55.248 kg)  BMI 23.03 kg/m2  General:   Normal  Skin:   normal  HEENT:  Normal  Neck:  Supple without Adenopathy or Thyromegaly  Lungs:   Heart:              Breasts:   Abdomen:  Pelvis:  M/S   Extremeties:  Neuro:    clear to auscultation bilaterally   Normal without murmur   Not Examined   soft, non-tender; bowel sounds normal; no masses,  no organomegaly   Exam deferred to OR;   Pelvic exam: (10/07/2015) External genitalia-normal BUS-normal. Vagina-third degree cystocele; no rectocele. Cervix-no motion tenderness; third-degree prolapse. Uterus-small, mobile, nontender, mid position, procidentia noted with Valsalva. Bimanual-no adnexal masses or tenderness. Rectovaginal-external exam normal.    No CVAT  Warm/Dry   Normal          Assessment:    1. Uterine procidentia 2. Third degree cystocele 3. Failed extensive pessary trial   Plan:     1. [facility] BSO with anterior colporrhaphy and cystoscopy  Preoperative  counseling: The patient is to undergo [facility] BSO with anterior colporrhaphy and cystoscopy for management of uterine procidentia with third-degree cystocele. She is understanding of the planned procedures and is aware of and is accepting of all surgical risks which include but are not limited to bleeding, infection, pelvic organ injury with need for repair, blood clot disorders, anesthesia risks, etc. All questions have been answered. Informed consent is given. Patient is ready and willing to proceed with surgery as scheduled.  Brayton Mars, MD  Note: This dictation was prepared with Dragon dictation along with smaller  phrase technology. Any transcriptional errors that result from this process are unintentional.

## 2015-12-16 LAB — RPR: RPR: NONREACTIVE

## 2015-12-21 ENCOUNTER — Encounter
Admission: RE | Disposition: A | Discharge status: Home / Self Care | Payer: Self-pay | Source: Ambulatory Visit | Attending: Obstetrics and Gynecology

## 2015-12-21 ENCOUNTER — Ambulatory Visit: Payer: Medicare Other | Admitting: Certified Registered Nurse Anesthetist

## 2015-12-21 ENCOUNTER — Encounter: Payer: Self-pay | Admitting: *Deleted

## 2015-12-21 ENCOUNTER — Observation Stay
Admission: RE | Admit: 2015-12-21 | Discharge: 2015-12-22 | Disposition: A | Discharge status: Home / Self Care | Payer: Medicare Other | Source: Ambulatory Visit | Attending: Obstetrics and Gynecology | Admitting: Obstetrics and Gynecology

## 2015-12-21 DIAGNOSIS — Z90710 Acquired absence of both cervix and uterus: Secondary | ICD-10-CM

## 2015-12-21 DIAGNOSIS — IMO0002 Reserved for concepts with insufficient information to code with codable children: Secondary | ICD-10-CM

## 2015-12-21 DIAGNOSIS — Z87892 Personal history of anaphylaxis: Secondary | ICD-10-CM | POA: Diagnosis not present

## 2015-12-21 DIAGNOSIS — N839 Noninflammatory disorder of ovary, fallopian tube and broad ligament, unspecified: Secondary | ICD-10-CM

## 2015-12-21 DIAGNOSIS — Z780 Asymptomatic menopausal state: Secondary | ICD-10-CM | POA: Insufficient documentation

## 2015-12-21 DIAGNOSIS — Z853 Personal history of malignant neoplasm of breast: Secondary | ICD-10-CM | POA: Diagnosis not present

## 2015-12-21 DIAGNOSIS — N814 Uterovaginal prolapse, unspecified: Secondary | ICD-10-CM

## 2015-12-21 DIAGNOSIS — Z880 Allergy status to penicillin: Secondary | ICD-10-CM | POA: Diagnosis not present

## 2015-12-21 DIAGNOSIS — Z885 Allergy status to narcotic agent status: Secondary | ICD-10-CM | POA: Diagnosis not present

## 2015-12-21 DIAGNOSIS — N813 Complete uterovaginal prolapse: Principal | ICD-10-CM | POA: Insufficient documentation

## 2015-12-21 DIAGNOSIS — K219 Gastro-esophageal reflux disease without esophagitis: Secondary | ICD-10-CM | POA: Insufficient documentation

## 2015-12-21 DIAGNOSIS — Z833 Family history of diabetes mellitus: Secondary | ICD-10-CM | POA: Diagnosis not present

## 2015-12-21 DIAGNOSIS — Z803 Family history of malignant neoplasm of breast: Secondary | ICD-10-CM | POA: Diagnosis not present

## 2015-12-21 DIAGNOSIS — Z86010 Personal history of colonic polyps: Secondary | ICD-10-CM | POA: Diagnosis not present

## 2015-12-21 DIAGNOSIS — N800 Endometriosis of uterus: Secondary | ICD-10-CM | POA: Insufficient documentation

## 2015-12-21 DIAGNOSIS — N888 Other specified noninflammatory disorders of cervix uteri: Secondary | ICD-10-CM | POA: Diagnosis not present

## 2015-12-21 DIAGNOSIS — N838 Other noninflammatory disorders of ovary, fallopian tube and broad ligament: Secondary | ICD-10-CM | POA: Insufficient documentation

## 2015-12-21 DIAGNOSIS — Z9049 Acquired absence of other specified parts of digestive tract: Secondary | ICD-10-CM | POA: Diagnosis not present

## 2015-12-21 DIAGNOSIS — Z888 Allergy status to other drugs, medicaments and biological substances status: Secondary | ICD-10-CM | POA: Insufficient documentation

## 2015-12-21 DIAGNOSIS — Z9889 Other specified postprocedural states: Secondary | ICD-10-CM | POA: Insufficient documentation

## 2015-12-21 DIAGNOSIS — Z8673 Personal history of transient ischemic attack (TIA), and cerebral infarction without residual deficits: Secondary | ICD-10-CM | POA: Diagnosis not present

## 2015-12-21 DIAGNOSIS — Z91041 Radiographic dye allergy status: Secondary | ICD-10-CM | POA: Insufficient documentation

## 2015-12-21 DIAGNOSIS — E785 Hyperlipidemia, unspecified: Secondary | ICD-10-CM | POA: Insufficient documentation

## 2015-12-21 DIAGNOSIS — N8110 Cystocele, unspecified: Secondary | ICD-10-CM | POA: Diagnosis present

## 2015-12-21 HISTORY — PX: VAGINAL HYSTERECTOMY: SHX2639

## 2015-12-21 HISTORY — PX: CYSTOCELE REPAIR: SHX163

## 2015-12-21 SURGERY — HYSTERECTOMY, VAGINAL
Anesthesia: General | Site: Vagina | Wound class: Clean Contaminated

## 2015-12-21 MED ORDER — CEFAZOLIN SODIUM-DEXTROSE 2-3 GM-% IV SOLR
INTRAVENOUS | Status: AC
  Filled 2015-12-21: qty 50

## 2015-12-21 MED ORDER — KETOROLAC TROMETHAMINE 30 MG/ML IJ SOLN: 15.0000 mg | Freq: Four times a day (QID) | INTRAMUSCULAR | Status: DC

## 2015-12-21 MED ORDER — SIMETHICONE 80 MG PO CHEW: 80.0000 mg | CHEWABLE_TABLET | Freq: Four times a day (QID) | ORAL | Status: DC | PRN

## 2015-12-21 MED ORDER — BISACODYL 10 MG RE SUPP: 10.0000 mg | Freq: Every day | RECTAL | Status: DC | PRN

## 2015-12-21 MED ORDER — FENTANYL CITRATE (PF) 100 MCG/2ML IJ SOLN
25.0000 ug | INTRAMUSCULAR | Status: DC | PRN
  Administered 2015-12-21 (×2): 25 ug via INTRAVENOUS

## 2015-12-21 MED ORDER — FENTANYL CITRATE (PF) 100 MCG/2ML IJ SOLN
INTRAMUSCULAR | Status: AC
  Administered 2015-12-21: 25 ug via INTRAVENOUS
  Filled 2015-12-21: qty 2

## 2015-12-21 MED ORDER — ESTROGENS, CONJUGATED 0.625 MG/GM VA CREA
TOPICAL_CREAM | VAGINAL | Status: AC
  Filled 2015-12-21: qty 30

## 2015-12-21 MED ORDER — LIDOCAINE HCL (CARDIAC) 20 MG/ML IV SOLN
INTRAVENOUS | Status: DC | PRN
  Administered 2015-12-21: 60 mg via INTRAVENOUS

## 2015-12-21 MED ORDER — CLINDAMYCIN PHOSPHATE 900 MG/50ML IV SOLN
INTRAVENOUS | Status: AC
  Filled 2015-12-21: qty 50

## 2015-12-21 MED ORDER — BELLADONNA ALKALOIDS-OPIUM 16.2-60 MG RE SUPP
RECTAL | Status: AC
  Filled 2015-12-21: qty 1

## 2015-12-21 MED ORDER — FLUORESCEIN SODIUM 10 % IJ SOLN
INTRAMUSCULAR | Status: AC
  Filled 2015-12-21: qty 5

## 2015-12-21 MED ORDER — GLYCOPYRROLATE 0.2 MG/ML IJ SOLN
INTRAMUSCULAR | Status: DC | PRN
  Administered 2015-12-21: 0.1 mg via INTRAVENOUS

## 2015-12-21 MED ORDER — DEXAMETHASONE SODIUM PHOSPHATE 10 MG/ML IJ SOLN
INTRAMUSCULAR | Status: DC | PRN
  Administered 2015-12-21: 5 mg via INTRAVENOUS

## 2015-12-21 MED ORDER — LACTATED RINGERS IV SOLN
INTRAVENOUS | Status: DC
  Administered 2015-12-21 (×2): via INTRAVENOUS

## 2015-12-21 MED ORDER — ROCURONIUM BROMIDE 100 MG/10ML IV SOLN
INTRAVENOUS | Status: DC | PRN
  Administered 2015-12-21: 10 mg via INTRAVENOUS
  Administered 2015-12-21 (×2): 5 mg via INTRAVENOUS
  Administered 2015-12-21: 30 mg via INTRAVENOUS

## 2015-12-21 MED ORDER — ONDANSETRON HCL 4 MG/2ML IJ SOLN: 4.0000 mg | Freq: Once | INTRAMUSCULAR | Status: DC | PRN

## 2015-12-21 MED ORDER — OXYCODONE-ACETAMINOPHEN 5-325 MG PO TABS: 1.0000 | ORAL_TABLET | ORAL | Status: DC | PRN

## 2015-12-21 MED ORDER — GENTAMICIN SULFATE 40 MG/ML IJ SOLN: INTRAVENOUS | Status: DC

## 2015-12-21 MED ORDER — ONDANSETRON HCL 4 MG/2ML IJ SOLN
INTRAMUSCULAR | Status: DC | PRN
  Administered 2015-12-21: 4 mg via INTRAVENOUS

## 2015-12-21 MED ORDER — ACETAMINOPHEN 325 MG PO TABS: 650.0000 mg | ORAL_TABLET | ORAL | Status: DC | PRN

## 2015-12-21 MED ORDER — KETOROLAC TROMETHAMINE 30 MG/ML IJ SOLN
15.0000 mg | Freq: Four times a day (QID) | INTRAMUSCULAR | Status: DC
  Administered 2015-12-21 – 2015-12-22 (×4): 15 mg via INTRAVENOUS
  Filled 2015-12-21 (×4): qty 1

## 2015-12-21 MED ORDER — PHENYLEPHRINE HCL 10 MG/ML IJ SOLN
INTRAMUSCULAR | Status: DC | PRN
  Administered 2015-12-21: 100 ug via INTRAVENOUS

## 2015-12-21 MED ORDER — LACTATED RINGERS IV SOLN
INTRAVENOUS | Status: DC
Start: 2015-12-21 — End: 2015-12-22
  Administered 2015-12-21 – 2015-12-22 (×2): via INTRAVENOUS

## 2015-12-21 MED ORDER — LACTATED RINGERS IV SOLN: INTRAVENOUS | Status: DC

## 2015-12-21 MED ORDER — ACETAMINOPHEN 10 MG/ML IV SOLN
INTRAVENOUS | Status: DC | PRN
  Administered 2015-12-21: 1000 mg via INTRAVENOUS

## 2015-12-21 MED ORDER — ESTROGENS, CONJUGATED 0.625 MG/GM VA CREA
TOPICAL_CREAM | VAGINAL | Status: DC | PRN
  Administered 2015-12-21: 1 via VAGINAL

## 2015-12-21 MED ORDER — MORPHINE SULFATE (PF) 2 MG/ML IV SOLN: 1.0000 mg | INTRAVENOUS | Status: DC | PRN

## 2015-12-21 MED ORDER — CEFAZOLIN SODIUM-DEXTROSE 2-3 GM-% IV SOLR: 2.0000 g | INTRAVENOUS | Status: DC

## 2015-12-21 MED ORDER — PROPOFOL 10 MG/ML IV BOLUS
INTRAVENOUS | Status: DC | PRN
  Administered 2015-12-21: 100 mg via INTRAVENOUS

## 2015-12-21 MED ORDER — GENTAMICIN SULFATE 40 MG/ML IJ SOLN: 80.0000 mg | Freq: Once | INTRAVENOUS | Status: DC

## 2015-12-21 MED ORDER — FLUORESCEIN SODIUM 10 % IJ SOLN
INTRAMUSCULAR | Status: DC | PRN
  Administered 2015-12-21: .5 mL via INTRAVENOUS

## 2015-12-21 MED ORDER — FENTANYL CITRATE (PF) 100 MCG/2ML IJ SOLN
INTRAMUSCULAR | Status: DC | PRN
  Administered 2015-12-21: 25 ug via INTRAVENOUS
  Administered 2015-12-21: 50 ug via INTRAVENOUS
  Administered 2015-12-21: 25 ug via INTRAVENOUS
  Administered 2015-12-21: 50 ug via INTRAVENOUS

## 2015-12-21 MED ORDER — DOCUSATE SODIUM 100 MG PO CAPS
100.0000 mg | ORAL_CAPSULE | Freq: Two times a day (BID) | ORAL | Status: DC
  Administered 2015-12-21 – 2015-12-22 (×2): 100 mg via ORAL
  Filled 2015-12-21 (×2): qty 1

## 2015-12-21 MED ORDER — ACETAMINOPHEN 10 MG/ML IV SOLN
INTRAVENOUS | Status: AC
  Filled 2015-12-21: qty 100

## 2015-12-21 MED ORDER — CLINDAMYCIN PHOSPHATE 900 MG/50ML IV SOLN: 900.0000 mg | Freq: Once | INTRAVENOUS | Status: DC

## 2015-12-21 MED ORDER — SUCCINYLCHOLINE CHLORIDE 20 MG/ML IJ SOLN
INTRAMUSCULAR | Status: DC | PRN
  Administered 2015-12-21: 100 mg via INTRAVENOUS

## 2015-12-21 MED ORDER — SUGAMMADEX SODIUM 200 MG/2ML IV SOLN
INTRAVENOUS | Status: DC | PRN
  Administered 2015-12-21: 109.8 mg via INTRAVENOUS

## 2015-12-21 MED ORDER — GENTAMICIN IN SALINE 1.6-0.9 MG/ML-% IV SOLN
80.0000 mg | Freq: Once | INTRAVENOUS | Status: AC
  Administered 2015-12-21: 80 mg via INTRAVENOUS
  Filled 2015-12-21: qty 50

## 2015-12-21 MED ORDER — CLINDAMYCIN PHOSPHATE 900 MG/50ML IV SOLN
900.0000 mg | Freq: Once | INTRAVENOUS | Status: AC
  Administered 2015-12-21: 900 mg via INTRAVENOUS

## 2015-12-21 SURGICAL SUPPLY — 35 items
BAG URO DRAIN 2000ML W/SPOUT (MISCELLANEOUS) ×4 IMPLANT
BLADE CLIPPER SURG (BLADE) ×4 IMPLANT
CANISTER SUCT 1200ML W/VALVE (MISCELLANEOUS) ×4 IMPLANT
CATH FOLEY 2WAY  5CC 16FR (CATHETERS) ×2
CATH URTH 16FR FL 2W BLN LF (CATHETERS) ×2 IMPLANT
DRAPE PERI LITHO V/GYN (MISCELLANEOUS) ×4 IMPLANT
DRAPE SHEET LG 3/4 BI-LAMINATE (DRAPES) ×4 IMPLANT
DRAPE UNDER BUTTOCK W/FLU (DRAPES) ×4 IMPLANT
ELECT REM PT RETURN 9FT ADLT (ELECTROSURGICAL) ×4
ELECTRODE REM PT RTRN 9FT ADLT (ELECTROSURGICAL) ×2 IMPLANT
GAUZE PACK 2X3YD (MISCELLANEOUS) ×4 IMPLANT
GLOVE BIO SURGEON STRL SZ8 (GLOVE) ×16 IMPLANT
GLOVE INDICATOR 8.0 STRL GRN (GLOVE) ×16 IMPLANT
GOWN STRL REUS W/ TWL LRG LVL3 (GOWN DISPOSABLE) ×4 IMPLANT
GOWN STRL REUS W/ TWL XL LVL3 (GOWN DISPOSABLE) ×4 IMPLANT
GOWN STRL REUS W/TWL LRG LVL3 (GOWN DISPOSABLE) ×4
GOWN STRL REUS W/TWL XL LVL3 (GOWN DISPOSABLE) ×4
KIT RM TURNOVER CYSTO AR (KITS) ×4 IMPLANT
LABEL OR SOLS (LABEL) ×4 IMPLANT
NS IRRIG 500ML POUR BTL (IV SOLUTION) ×4 IMPLANT
PACK BASIN MINOR ARMC (MISCELLANEOUS) ×4 IMPLANT
PAD OB MATERNITY 4.3X12.25 (PERSONAL CARE ITEMS) ×4 IMPLANT
PAD PREP 24X41 OB/GYN DISP (PERSONAL CARE ITEMS) ×4 IMPLANT
SET CYSTO W/LG BORE CLAMP LF (SET/KITS/TRAYS/PACK) ×4 IMPLANT
SOL PREP PVP 2OZ (MISCELLANEOUS)
SOLUTION PREP PVP 2OZ (MISCELLANEOUS) IMPLANT
SUT CHROMIC 0 CT 1 (SUTURE) ×8 IMPLANT
SUT CHROMIC 1-0 (SUTURE) ×4 IMPLANT
SUT CHROMIC 2 0 CT 1 (SUTURE) ×28 IMPLANT
SUT VIC AB 0 CT1 27 (SUTURE) ×6
SUT VIC AB 0 CT1 27XCR 8 STRN (SUTURE) ×6 IMPLANT
SUT VIC AB 0 CT1 36 (SUTURE) ×8 IMPLANT
SUT VIC AB 0 CT2 27 (SUTURE) ×8 IMPLANT
SUT VIC AB 2-0 UR6 27 (SUTURE) ×8 IMPLANT
SYRINGE 10CC LL (SYRINGE) ×4 IMPLANT

## 2015-12-21 NOTE — Transfer of Care (Signed)
Immediate Anesthesia Transfer of Care Note  Patient: Sheryl Michael  Procedure(s) Performed: Procedure(s): [facility] BSO (Bilateral) ANTERIOR REPAIR (CYSTOCELE) (N/A)  Patient Location: PACU  Anesthesia Type:General  Level of Consciousness: sedated  Airway & Oxygen Therapy: Patient Spontanous Breathing and Patient connected to face mask oxygen  Post-op Assessment: Report given to RN and Post -op Vital signs reviewed and stable  Post vital signs: Reviewed and stable  Last Vitals:  Filed Vitals:   12/21/15 0612 12/21/15 0956  BP: 163/61 153/86  Pulse: 62 75  Temp: 36.6 C 36.5 C  Resp: 16 15    Complications: No apparent anesthesia complications

## 2015-12-21 NOTE — OR Nursing (Signed)
Ice applied to IV site attempts on left hand and wrist - bruising noted. Pt and family aware

## 2015-12-21 NOTE — Anesthesia Preprocedure Evaluation (Signed)
Anesthesia Evaluation  Patient identified by MRN, date of birth, ID band Patient awake    Reviewed: Allergy & Precautions, H&P , NPO status , Patient's Chart, lab work & pertinent test results, reviewed documented beta blocker date and time   Airway Mallampati: III  TM Distance: >3 FB Neck ROM: full    Dental  (+) Teeth Intact   Pulmonary neg pulmonary ROS,    Pulmonary exam normal        Cardiovascular negative cardio ROS Normal cardiovascular exam Rhythm:regular Rate:Normal     Neuro/Psych PSYCHIATRIC DISORDERS TIAnegative neurological ROS  negative psych ROS   GI/Hepatic negative GI ROS, Neg liver ROS, GERD  ,  Endo/Other  negative endocrine ROS  Renal/GU negative Renal ROS  negative genitourinary   Musculoskeletal   Abdominal   Peds  Hematology negative hematology ROS (+)   Anesthesia Other Findings Past Medical History:   Recurrent UTI                                                Female bladder prolapse                                      TIA (transient ischemic attack)                              Goiter                                                       Reflux                                                       Colon polyp                                                  Cystocele                                                    Vaginal atrophy                                              Procidentia of uterus                                        Hyperlipemia  Breast cancer ([place])                             2006           Comment:right breast ca with lumpectomy and rad tx   Depression                                                 Past Surgical History:   DILATION AND CURETTAGE OF UTERUS                              APPENDECTOMY                                                  BREAST SURGERY                                  Right             BMI    Body Mass Index   22.87 kg/m 2     Reproductive/Obstetrics negative OB ROS                             Anesthesia Physical Anesthesia Plan  ASA: III  Anesthesia Plan: General ETT   Post-op Pain Management:    Induction:   Airway Management Planned:   Additional Equipment:   Intra-op Plan:   Post-operative Plan:   Informed Consent: I have reviewed the patients History and Physical, chart, labs and discussed the procedure including the risks, benefits and alternatives for the proposed anesthesia with the patient or authorized representative who has indicated his/her understanding and acceptance.   Dental Advisory Given  Plan Discussed with: CRNA  Anesthesia Plan Comments:         Anesthesia Quick Evaluation

## 2015-12-21 NOTE — Interval H&P Note (Signed)
History and Physical Interval Note:  12/21/2015 7:28 AM  Sheryl Michael  has presented today for surgery, with the diagnosis of UTERINE PROCIDENTIA,CYSTOCELE  The various methods of treatment have been discussed with the patient and family. After consideration of risks, benefits and other options for treatment, the patient has consented to  Procedure(s): TVH BSO (Bilateral) ANTERIOR REPAIR (CYSTOCELE) (N/A) as a surgical intervention .  The patient's history has been reviewed, patient examined, no change in status, stable for surgery.  I have reviewed the patient's chart and labs.  Questions were answered to the patient's satisfaction.     Hassell Done A Lindaann Gradilla

## 2015-12-21 NOTE — H&P (View-Only) (Signed)
Subjective: PREOPERATIVE HISTORY AND PHYSICAL    Date of surgery: 12/21/2015 Diagnoses: 1. Uterine procidentia 2. Third-degree cystocele   Patient is a 82 y.o. P3003.female scheduled for [facility] BSO with anterior colporrhaphy, and cystoscopy. Indications for procedure are uterine procidentia with third-degree cystocele Patient does not have incontinence symptoms. Multiple pessaries unsuccessful in managing symptomatology.   Pertinent Gynecological History: Menses: post-menopausal    Menstrual History: OB History    No data available      Menarche age:NA  No LMP recorded. Patient is postmenopausal.    Past Medical History  Diagnosis Date  . Recurrent UTI   . Female bladder prolapse   . TIA (transient ischemic attack)   . Goiter   . Reflux   . Colon polyp   . Cystocele   . Vaginal atrophy   . Procidentia of uterus   . Hyperlipemia   . Breast cancer [facility]) 2006    right breast ca with lumpectomy and rad tx    Past Surgical History  Procedure Laterality Date  . Dilation and curettage of uterus    . Appendectomy    . Breast surgery Left     OB History  No data available  P3003 SVD x 3  Social History   Social History  . Marital Status: Widowed    Spouse Name: N/A  . Number of Children: N/A  . Years of Education: N/A   Social History Main Topics  . Smoking status: Never Smoker   . Smokeless tobacco: None  . Alcohol Use: No  . Drug Use: No  . Sexual Activity: No   Other Topics Concern  . None   Social History Narrative    Family History  Problem Relation Age of Onset  . Diabetes Sister   . Breast cancer Sister     late 99's  . Diabetes Brother      (Not in a hospital admission)  Allergies  Allergen Reactions  . Iodinated Diagnostic Agents Anaphylaxis  . Codeine Nausea And Vomiting  . Penicillin V Potassium Other (See Comments)  . Phenobarbital Other (See Comments)    Review of Systems Constitutional: No recent  fever/chills/sweats Respiratory: No recent cough/bronchitis Cardiovascular: No chest pain Gastrointestinal: No recent nausea/vomiting/diarrhea Genitourinary: No UTI symptoms Hematologic/lymphatic:No history of coagulopathy or recent blood thinner use    Objective:    BP 139/62 mmHg  Pulse 57  Ht 5\' 1"  (1.549 m)  Wt 121 lb 12.8 oz (55.248 kg)  BMI 23.03 kg/m2  General:   Normal  Skin:   normal  HEENT:  Normal  Neck:  Supple without Adenopathy or Thyromegaly  Lungs:   Heart:              Breasts:   Abdomen:  Pelvis:  M/S   Extremeties:  Neuro:    clear to auscultation bilaterally   Normal without murmur   Not Examined   soft, non-tender; bowel sounds normal; no masses,  no organomegaly   Exam deferred to OR;   Pelvic exam: (10/07/2015) External genitalia-normal BUS-normal. Vagina-third degree cystocele; no rectocele. Cervix-no motion tenderness; third-degree prolapse. Uterus-small, mobile, nontender, mid position, procidentia noted with Valsalva. Bimanual-no adnexal masses or tenderness. Rectovaginal-external exam normal.    No CVAT  Warm/Dry   Normal          Assessment:    1. Uterine procidentia 2. Third degree cystocele 3. Failed extensive pessary trial   Plan:     1. TVH BSO with anterior colporrhaphy and cystoscopy  Preoperative  counseling: The patient is to undergo TVH BSO with anterior colporrhaphy and cystoscopy for management of uterine procidentia with third-degree cystocele. She is understanding of the planned procedures and is aware of and is accepting of all surgical risks which include but are not limited to bleeding, infection, pelvic organ injury with need for repair, blood clot disorders, anesthesia risks, etc. All questions have been answered. Informed consent is given. Patient is ready and willing to proceed with surgery as scheduled.  Brayton Mars, MD  Note: This dictation was prepared with Dragon dictation along with smaller  phrase technology. Any transcriptional errors that result from this process are unintentional.

## 2015-12-21 NOTE — Anesthesia Procedure Notes (Signed)
Procedure Name: Intubation Performed by: Rai Sinagra Pre-anesthesia Checklist: Patient identified, Patient being monitored, Timeout performed, Emergency Drugs available and Suction available Patient Re-evaluated:Patient Re-evaluated prior to inductionOxygen Delivery Method: Circle system utilized Preoxygenation: Pre-oxygenation with 100% oxygen Intubation Type: IV induction Ventilation: Mask ventilation without difficulty Laryngoscope Size: Mac and 3 Grade View: Grade II Tube type: Oral Tube size: 7.0 mm Number of attempts: 1 Airway Equipment and Method: Stylet Placement Confirmation: ETT inserted through vocal cords under direct vision,  positive ETCO2 and breath sounds checked- equal and bilateral Secured at: 21 cm Tube secured with: Tape Dental Injury: Teeth and Oropharynx as per pre-operative assessment        

## 2015-12-21 NOTE — Op Note (Signed)
OPERATIVE NOTE:  ZELENE WHITBY PROCEDURE DATE: 12/21/2015   PREOPERATIVE DIAGNOSIS:  1. Third-degree cystocele 2. Uterine prolapse POSTOPERATIVE DIAGNOSIS:  1. Third-degree cystocele 2. Uterine prolapse 3. Patent ureters on cystoscopy PROCEDURE:  1. Transvaginal hysterectomy left salpingo-oophorectomy 2. Anterior colporrhaphy 3. Cystoscopy  SURGEON:  Brayton Mars, MD ASSISTANTS: Dr. Marcelline Mates and PA-S Olene Floss  ANESTHESIA: General INDICATIONS: 82 y.o. No obstetric history on file. Patient has symptomatic third-degree cystocele and uterine prolapse who desires definitive surgical intervention. Pessary trial was declined.  FINDINGS:   1. Normal-appearing uterus; atrophic left tube and ovary; right ovary and tube were not visualized 2. Third degree cystocele 3. Cystoscopy demonstrated patent ureters bilaterally   I/O's: Total I/O In: 1000 [I.V.:1000] Out: 650 [Urine:500; Blood:150] COUNTS:  YES SPECIMENS: Cervix uterus and left fallopian tube and ovary ANTIBIOTIC PROPHYLAXIS:Gentamicin and Cleocin COMPLICATIONS: None immediate  PROCEDURE IN DETAIL: Patient was brought to the operating room versus placed in supine position general endotracheal anesthesia was induced without difficulty. She was placed in the dorsal lithotomy position using the candycane stirrups. A Hibiclens perineal intravaginal prep and drape performed in standard fashion. Foley catheter was placed and was draining clear yellow urine. Weighted speculum was placed in the vagina. Double-tooth tenaculum was placed on the cervix. Posterior colpotomy was made with Mayo scissors. Uterosacral ligaments were clamped cut and stick tied using 0 Vicryl suture. The posterior cuff was run using 0 chromic suture in a simple running baseball stitch. The anterior vaginal mucosa was then circumcised with the Bovie cautery. The vagina and bladder was dissected off of the cervix through sharp and blunt dissection. Eventually  the anterior cul-de-sac was entered. The cardinal broad ligament complexes were then sequentially clamped cut and stick tied using 0 Vicryl suture. Eventually the utero-ovarian ligaments were clamped cut and stick tied using 0 Vicryl suture. These were tagged. The left adnexal structures were then visualized. The infundibulopelvic ligament was clamped cut and stick tied using 0 Vicryl suture. The right adnexal structures could not be visualized as they were atrophic and high within the abdominal cavity. Decision was made to not remove tube and ovary. The peritoneum was then reapproximated using 0 Vicryl suture in a pursestring manner. Anterior colporrhaphy was then performed in standard fashion. The angles of the vagina and bladder were grasped with Geraldine Contras clamps. The vagina was undermined with scissors and incised in the midline. This was carried out sequentially up to within 3 cm of the urethral meatus. Allis Adair clamps were used to help facilitate exposure. The perivesical fascia was dissected from the vagina through sharp and blunt dissection. Once the bladder was adequately mobilized vertical mattress sutures of 2-0 Vicryl were used to reduce the cystocele. Once complete, intravaginal pessary was trimmed. The vagina was then reapproximated in the midline using 2-0 chromic simple interrupted sutures. Following completion of the anterior colporrhaphy cystoscopy was performed. 30 scope was used along with lactated Ringer's as irrigant. 0.5 cc of fluorescein dye was injected intravenously. Inspection of the bladder demonstrated normal-appearing bladder mucosa without evidence of suture or laceration. The ureters were noted to have appropriate reflux of fluorescein dye bilaterally. Upon completion of the procedure the cystoscope was removed, the vagina was packed with Kerlix gauze with Premarin cream. The Foley catheter was placed. Patient was then awakened extubated and taken in satisfactory  condition  Estimated blood loss 100 cc IV fluids-see anesthesia flow sheet Urine output-see anesthesia flow sheet WAS needles and sponge counts were verified as correct.  Hassell Done  Carron Curie, MD, ACOG [facility]. Has. The adenoids

## 2015-12-22 DIAGNOSIS — N813 Complete uterovaginal prolapse: Secondary | ICD-10-CM | POA: Diagnosis not present

## 2015-12-22 DIAGNOSIS — Z90710 Acquired absence of both cervix and uterus: Secondary | ICD-10-CM

## 2015-12-22 LAB — HEMOGLOBIN: Hemoglobin: 11.8 g/dL — ABNORMAL LOW (ref 12.0–16.0)

## 2015-12-22 LAB — SURGICAL PATHOLOGY

## 2015-12-22 MED ORDER — IBUPROFEN 600 MG PO TABS: 600.0000 mg | ORAL_TABLET | Freq: Three times a day (TID) | ORAL | Status: DC

## 2015-12-22 MED ORDER — LEVOTHYROXINE SODIUM 50 MCG PO TABS
50.0000 ug | ORAL_TABLET | Freq: Every day | ORAL | Status: DC
  Administered 2015-12-22: 50 ug via ORAL
  Filled 2015-12-22: qty 1

## 2015-12-22 MED ORDER — ESCITALOPRAM OXALATE 10 MG PO TABS
10.0000 mg | ORAL_TABLET | Freq: Every day | ORAL | Status: DC
  Administered 2015-12-22: 10 mg via ORAL
  Filled 2015-12-22: qty 1

## 2015-12-22 MED ORDER — OXYCODONE-ACETAMINOPHEN 5-325 MG PO TABS: 1.0000 | ORAL_TABLET | ORAL | Status: DC | PRN

## 2015-12-22 MED ORDER — DOCUSATE SODIUM 100 MG PO CAPS: 100.0000 mg | ORAL_CAPSULE | Freq: Two times a day (BID) | ORAL | Status: DC

## 2015-12-22 NOTE — Discharge Summary (Signed)
Physician Discharge Summary  Patient ID: Sheryl Michael MRN: [id number] DOB/AGE: [birth year] 80 y.o.  Admit date: 12/21/2015 Discharge date: 12/22/2015  Admission Diagnoses: Pelvic Organ Prolapse  Discharge Diagnoses:  Pelvic Organ Prolapse  Operative Procedures: [facility] anterior Repair Hospital Course: Uncomplicated .   Significant Diagnostic Studies:  Lab Results  Component Value Date   HGB 11.8* 12/22/2015   HGB 14.5 12/15/2015   HGB 11.9* 04/15/2013   Lab Results  Component Value Date   HCT 43.7 12/15/2015   HCT 35.7 04/15/2013   HCT 44.2 04/14/2013   CBC Latest Ref Rng 12/22/2015 12/15/2015 04/15/2013  WBC 3.6 - 11.0 K/uL - 4.4 4.7  Hemoglobin 12.0 - 16.0 g/dL 11.8(L) 14.5 11.9(L)  Hematocrit 35.0 - 47.0 % - 43.7 35.7  Platelets 150 - 440 K/uL - 189 163     Discharged Condition: good  Discharge Exam: Blood pressure 98/40, pulse 66, temperature 98.2 F (36.8 C), temperature source Oral, resp. rate 18, height 5\' 1"  (1.549 m), weight 121 lb (54.885 kg), SpO2 95 %. Incision/Wound: None  Disposition: Final discharge disposition not confirmed  Discharge Instructions    Discharge patient    Complete by:  As directed             Medication List    TAKE these medications        calcium carbonate 1500 (600 Ca) MG Tabs tablet  Commonly known as:  OSCAL  Take by mouth 2 (two) times daily with a meal.     docusate sodium 100 MG capsule  Commonly known as:  COLACE  Take 1 capsule (100 mg total) by mouth 2 (two) times daily.     escitalopram 10 MG tablet  Commonly known as:  LEXAPRO  Take 10 mg by mouth.     ibuprofen 600 MG tablet  Commonly known as:  ADVIL,MOTRIN  Take 1 tablet (600 mg total) by mouth 3 (three) times daily.     levothyroxine 50 MCG tablet  Commonly known as:  SYNTHROID, LEVOTHROID  Take 50 mcg by mouth daily before breakfast.     omeprazole 20 MG capsule  Commonly known as:  PRILOSEC  Take 20 mg by mouth daily.     oxyCODONE-acetaminophen  5-325 MG tablet  Commonly known as:  PERCOCET/ROXICET  Take 1-2 tablets by mouth every 4 (four) hours as needed (moderate to severe pain (when tolerating fluids)).           Follow-up Information    Follow up with Brayton Mars, MD In 1 week.   Specialties:  Obstetrics and Gynecology, Radiology   Why:  Post Op Check   Contact information:   [address] [address] [phone]       Signed: Alanda Slim Kevia Zaucha 12/22/2015, 2:02 PM

## 2015-12-22 NOTE — Anesthesia Postprocedure Evaluation (Signed)
Anesthesia Post Note  Patient: Sheryl Michael  Procedure(s) Performed: Procedure(s) (LRB): TVH BSO (Bilateral) ANTERIOR REPAIR (CYSTOCELE) (N/A)  Patient location during evaluation: PACU Anesthesia Type: General Level of consciousness: awake and alert Pain management: pain level controlled Vital Signs Assessment: post-procedure vital signs reviewed and stable Respiratory status: spontaneous breathing, nonlabored ventilation, respiratory function stable and patient connected to nasal cannula oxygen Cardiovascular status: blood pressure returned to baseline and stable Postop Assessment: no signs of nausea or vomiting Anesthetic complications: no    Last Vitals:  Filed Vitals:   12/22/15 0932 12/22/15 1119  BP: 127/80 98/40  Pulse: 78 66  Temp:  36.7 C  Resp:  18    Last Pain:  Filed Vitals:   12/22/15 1427  PainSc: 0-No pain                 Molli Barrows

## 2015-12-22 NOTE — Progress Notes (Signed)
Carlene Coria, RN of bp

## 2015-12-22 NOTE — Progress Notes (Signed)
D/C order from MD.  Reviewed d/c instructions and prescriptions with patient and answered any questions.  Patient d/c home via wheelchair by nursing/auxillary. 

## 2015-12-22 NOTE — Progress Notes (Signed)
1 Day Post-Op Procedure(s) (LRB): TVHLSO ANTERIOR REPAIR (CYSTOCELE) (N/A)  Subjective: Patient reports tolerating PO and + flatus.    Objective: I have reviewed patient's vital signs, intake and output, medications and labs.  General: alert and cooperative Resp: clear to auscultation bilaterally Cardio: regular rate and rhythm, S1, S2 normal, no murmur, click, rub or gallop GI: soft, non-tender; bowel sounds normal; no masses,  no organomegaly Extremities: extremities normal, atraumatic, no cyanosis or edema Vaginal Bleeding: minimal  Assessment:  1. Postop day 1 status post TVH LSO anterior colporrhaphy-normal  Plan: Advance diet Encourage ambulation Advance to PO medication Discharge home later today     Hassell Done A Erienne Spelman 12/22/2015, 8:20 AM

## 2015-12-23 ENCOUNTER — Telehealth: Payer: Self-pay | Admitting: Obstetrics and Gynecology

## 2015-12-23 NOTE — Telephone Encounter (Signed)
She wants to change her pharmacy in her chart to [facility] from now on. She is going to pick up what Dr Tennis Must [facility] to [facility] this one time but change everyhting else as of 3/8 to [facility].

## 2015-12-23 NOTE — Telephone Encounter (Signed)
Pharmacy changed to [facility].

## 2015-12-29 ENCOUNTER — Ambulatory Visit (INDEPENDENT_AMBULATORY_CARE_PROVIDER_SITE_OTHER): Payer: Medicare Other | Admitting: Obstetrics and Gynecology

## 2015-12-29 ENCOUNTER — Encounter: Payer: Self-pay | Admitting: Obstetrics and Gynecology

## 2015-12-29 VITALS — BP 121/62 | HR 63 | Ht 61.0 in | Wt 121.0 lb

## 2015-12-29 DIAGNOSIS — B029 Zoster without complications: Secondary | ICD-10-CM

## 2015-12-29 DIAGNOSIS — Z09 Encounter for follow-up examination after completed treatment for conditions other than malignant neoplasm: Secondary | ICD-10-CM

## 2015-12-29 MED ORDER — VALACYCLOVIR HCL 1 G PO TABS: 1000.0000 mg | ORAL_TABLET | Freq: Three times a day (TID) | ORAL | Status: DC

## 2015-12-29 NOTE — Progress Notes (Signed)
GYN ENCOUNTER NOTE  Subjective:       Sheryl Michael is a 82 y.o. No obstetric history on file. female is here for gynecologic evaluation of the following issues:  1. Vesicular Rash  2. 1 week post [facility] and anterior repair  82 year old female presents 1 week post op TVHLSO with an anterior repair. Denies urinary symptoms. Having good bowel movements. Eating and drinking ok. Has taken 2 Aleve after surgery for pain, no narcotic use. Denies fevers. Presents with healing vesicular rash on left mons pubis x 3 days.    Gynecologic History No LMP recorded. Patient is postmenopausal. Contraception: post menopausal status   Obstetric History OB History  No data available    Past Medical History  Diagnosis Date  . Recurrent UTI   . Female bladder prolapse   . TIA (transient ischemic attack)   . Goiter   . Reflux   . Colon polyp   . Cystocele   . Vaginal atrophy   . Procidentia of uterus   . Hyperlipemia   . Breast cancer ([place]) 2006    right breast ca with lumpectomy and rad tx  . Depression     Past Surgical History  Procedure Laterality Date  . Dilation and curettage of uterus    . Appendectomy    . Breast surgery Right   . Vaginal hysterectomy Bilateral 12/21/2015    Procedure: TVH BSO;  Surgeon: Brayton Mars, MD;  Location: [facility] ORS;  Service: Gynecology;  Laterality: Bilateral;  . Cystocele repair N/A 12/21/2015    Procedure: ANTERIOR REPAIR (CYSTOCELE);  Surgeon: Brayton Mars, MD;  Location: [facility] ORS;  Service: Gynecology;  Laterality: N/A;    Current Outpatient Prescriptions on File Prior to Visit  Medication Sig Dispense Refill  . calcium carbonate (OSCAL) 1500 (600 CA) MG TABS tablet Take by mouth 2 (two) times daily with a meal.    . docusate sodium (COLACE) 100 MG capsule Take 1 capsule (100 mg total) by mouth 2 (two) times daily. 10 capsule 0  . escitalopram (LEXAPRO) 10 MG tablet Take 10 mg by mouth.    Marland Kitchen ibuprofen (ADVIL,MOTRIN) 600 MG  tablet Take 1 tablet (600 mg total) by mouth 3 (three) times daily. 30 tablet 0  . levothyroxine (SYNTHROID, LEVOTHROID) 50 MCG tablet Take 50 mcg by mouth daily before breakfast.    . omeprazole (PRILOSEC) 20 MG capsule Take 20 mg by mouth daily.     No current facility-administered medications on file prior to visit.    Allergies  Allergen Reactions  . Iodinated Diagnostic Agents Anaphylaxis  . Codeine Nausea And Vomiting  . Phenobarbital Other (See Comments)  . Penicillin V Potassium Itching and Rash    Social History   Social History  . Marital Status: Widowed    Spouse Name: N/A  . Number of Children: N/A  . Years of Education: N/A   Occupational History  . Not on file.   Social History Main Topics  . Smoking status: Never Smoker   . Smokeless tobacco: Not on file  . Alcohol Use: No  . Drug Use: No  . Sexual Activity: No   Other Topics Concern  . Not on file   Social History Narrative    Family History  Problem Relation Age of Onset  . Diabetes Sister   . Breast cancer Sister     late 53's  . Diabetes Brother     The following portions of the patient's  history were reviewed and updated as appropriate: allergies, current medications, past family history, past medical history, past social history, past surgical history and problem list.  Review of Systems Review of Systems - Dermatological ROS: positive for rash Review of Systems - General ROS: negative for - chills, fatigue, fever, hot flashes, malaise or night sweats Hematological and Lymphatic ROS: negative for - bleeding problems or swollen lymph nodes Gastrointestinal ROS: negative for - abdominal pain, blood in stools, change in bowel habits and nausea/vomiting Musculoskeletal ROS: negative for - joint pain, muscle pain or muscular weakness Genito-Urinary ROS: negative for - change in menstrual cycle, dysmenorrhea, dyspareunia, dysuria, genital discharge, genital ulcers, hematuria, incontinence,  irregular/heavy menses, nocturia or pelvic pain  Objective:   BP 121/62 mmHg  Pulse 63  Ht 5\' 1"  (1.549 m)  Wt 121 lb (54.885 kg)  BMI 22.87 kg/m2 CONSTITUTIONAL: Well-developed, well-nourished female in no acute distress.  PELVIC:  External Genitalia: Healing, erythematous, crusting, vesicular rash on left mons consistent with Herpes Zoster  Pathology: Showed Adenomyosis     Assessment:   1. Shingles Erythematous vesicular rash on Left Mons Pubis  2. Postop check Status post TVH LSO with anterior colporrhaphy     Plan:   1. Valtrex 1g TID x 7 days  2. Follow- Up in 5 weeks  3.  Continue with routine postop precautions  Luz Lex PA-S Brayton Mars, MD   I have seen, interviewed, and examined the patient in conjunction with the [facility].A. student and affirm the diagnosis and management plan. [name], MD, FACOG   Note: This dictation was prepared with Dragon dictation along with smaller phrase technology. Any transcriptional errors that result from this process are unintentional.

## 2015-12-29 NOTE — Patient Instructions (Signed)
1. Valtrex 1 g 3 times a day for 7 days to treat shingles 2. Return in 5 weeks for final postop check 3. Continue with limited lifting postoperatively as discussed

## 2016-01-08 ENCOUNTER — Telehealth: Payer: Self-pay

## 2016-01-08 NOTE — Telephone Encounter (Signed)
Pt states she is constipated. No fever. No N/V/D. Taking colace bid. Currently not taking any pain meds. Advised she may want to add a third stool softener. Increase h20, fruits, and veggies. Try apple juice and prune juice. May want to add suppository for immediate relief if needed. If pt develops fever or n/v she will need to let us know.

## 2016-03-09 ENCOUNTER — Ambulatory Visit: Payer: Medicare Other | Admitting: Obstetrics and Gynecology

## 2016-03-15 ENCOUNTER — Encounter: Payer: Self-pay | Admitting: Obstetrics and Gynecology

## 2016-03-15 ENCOUNTER — Ambulatory Visit (INDEPENDENT_AMBULATORY_CARE_PROVIDER_SITE_OTHER): Payer: Medicare Other | Admitting: Obstetrics and Gynecology

## 2016-03-15 VITALS — BP 127/67 | HR 72 | Ht 61.0 in | Wt 125.9 lb

## 2016-03-15 DIAGNOSIS — N8110 Cystocele, unspecified: Secondary | ICD-10-CM

## 2016-03-15 DIAGNOSIS — N813 Complete uterovaginal prolapse: Secondary | ICD-10-CM

## 2016-03-15 DIAGNOSIS — Z09 Encounter for follow-up examination after completed treatment for conditions other than malignant neoplasm: Secondary | ICD-10-CM

## 2016-03-15 DIAGNOSIS — Z90710 Acquired absence of both cervix and uterus: Secondary | ICD-10-CM

## 2016-03-15 DIAGNOSIS — IMO0002 Reserved for concepts with insufficient information to code with codable children: Secondary | ICD-10-CM

## 2016-03-15 NOTE — Progress Notes (Signed)
Chief complaint: 1. Final postop check 2. 6 weeks status post TVH LSO anterior colporrhaphy  Patient is doing well postoperatively. Bowel and bladder function are normal. She is not experiencing any pelvic pain. No vaginal bleeding or discharge have been noted.  OBJECTIVE: BP 127/67 mmHg  Pulse 72  Ht 5\' 1"  (1.549 m)  Wt 125 lb 14.4 oz (57.108 kg)  BMI 23.80 kg/m2 Pleasant white female in no acute distress. Normal ambulation. Abdomen: Soft, nontender without organomegaly Pelvic exam: Surgeon Terry-normal BUS-normal Vagina-well-healed incisions; first to second-degree cystocele; vaginal cuff intact Cervix-surgically absent Uterus-surgically absent Bimanual-no palpable masses or tenderness Rectovaginal-normal external exam  ASSESSMENT: 1. Normal 6 week postop check status post TVH LSO and anterior colporrhaphy for uterine procidentia and third-degree cystocele  PLAN: 1. Resume all activities without restriction 2. Return in 1 year for follow-up or as needed  Brayton Mars, MD  Note: This dictation was prepared with Dragon dictation along with smaller phrase technology. Any transcriptional errors that result from this process are unintentional.

## 2016-03-15 NOTE — Patient Instructions (Signed)
1.  Resume all activities without restriction 2.  Return in 1 year for follow-up 

## 2016-10-13 ENCOUNTER — Other Ambulatory Visit: Payer: Self-pay | Admitting: Internal Medicine

## 2016-10-13 ENCOUNTER — Ambulatory Visit
Admission: RE | Admit: 2016-10-13 | Discharge: 2016-10-13 | Disposition: A | Discharge status: Home / Self Care | Payer: Medicare Other | Source: Ambulatory Visit | Attending: Internal Medicine | Admitting: Internal Medicine

## 2016-10-13 DIAGNOSIS — R42 Dizziness and giddiness: Secondary | ICD-10-CM

## 2016-10-13 DIAGNOSIS — R270 Ataxia, unspecified: Secondary | ICD-10-CM

## 2016-10-19 ENCOUNTER — Other Ambulatory Visit: Payer: Self-pay | Admitting: Internal Medicine

## 2016-10-19 DIAGNOSIS — Z1231 Encounter for screening mammogram for malignant neoplasm of breast: Secondary | ICD-10-CM

## 2016-11-28 ENCOUNTER — Ambulatory Visit
Admission: RE | Admit: 2016-11-28 | Discharge: 2016-11-28 | Disposition: A | Discharge status: Home / Self Care | Payer: Medicare Other | Source: Ambulatory Visit | Attending: Internal Medicine | Admitting: Internal Medicine

## 2016-11-28 DIAGNOSIS — Z1231 Encounter for screening mammogram for malignant neoplasm of breast: Secondary | ICD-10-CM | POA: Insufficient documentation

## 2016-12-05 DIAGNOSIS — M7581 Other shoulder lesions, right shoulder: Secondary | ICD-10-CM | POA: Insufficient documentation

## 2016-12-20 ENCOUNTER — Encounter: Payer: Self-pay | Admitting: Physical Therapy

## 2016-12-20 ENCOUNTER — Ambulatory Visit: Payer: Medicare Other | Attending: Surgery | Admitting: Physical Therapy

## 2016-12-20 VITALS — BP 121/52 | HR 55

## 2016-12-20 DIAGNOSIS — G8929 Other chronic pain: Secondary | ICD-10-CM

## 2016-12-20 DIAGNOSIS — M25511 Pain in right shoulder: Secondary | ICD-10-CM | POA: Diagnosis not present
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
## 2016-12-22 ENCOUNTER — Encounter: Payer: Self-pay | Admitting: Physical Therapy

## 2016-12-22 ENCOUNTER — Ambulatory Visit: Payer: Medicare Other | Admitting: Physical Therapy

## 2016-12-22 VITALS — BP 113/59 | HR 63

## 2016-12-22 DIAGNOSIS — M25511 Pain in right shoulder: Principal | ICD-10-CM

## 2016-12-22 DIAGNOSIS — G8929 Other chronic pain: Secondary | ICD-10-CM
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
## 2016-12-22 NOTE — Patient Instructions (Addendum)
Pectoral Stretch, Supine on Foam Roller    Lie on back and place rolled up towel between your shoulder blades and allow arms to spread wide. Hold 2 minutes. Do 2 sessions per day.  This should be a painfree stretch to open your chest, discontinue this stretch if it becomes painful.  Copyright  VHI. All rights reserved.

## 2016-12-27 ENCOUNTER — Encounter: Payer: Self-pay | Admitting: Physical Therapy

## 2016-12-27 ENCOUNTER — Ambulatory Visit: Payer: Medicare Other | Admitting: Physical Therapy

## 2016-12-27 DIAGNOSIS — M25511 Pain in right shoulder: Secondary | ICD-10-CM | POA: Diagnosis not present

## 2016-12-27 DIAGNOSIS — G8929 Other chronic pain: Secondary | ICD-10-CM
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
## 2016-12-27 NOTE — Patient Instructions (Addendum)
(  Clinic) Retraction: Row - Bilateral ([place])    Facing pulley, arms reaching forward, pull hands toward stomach, pinching shoulder blades together. Repeat ____ times per set. Do ____ sets per session. Do ____ sessions per week. Use ____ lb weights.  Copyright  VHI. All rights reserved.

## 2016-12-29 ENCOUNTER — Ambulatory Visit: Payer: Medicare Other | Admitting: Physical Therapy

## 2016-12-29 ENCOUNTER — Encounter: Payer: Self-pay | Admitting: Physical Therapy

## 2016-12-29 DIAGNOSIS — M25511 Pain in right shoulder: Secondary | ICD-10-CM | POA: Diagnosis not present

## 2016-12-29 DIAGNOSIS — G8929 Other chronic pain: Secondary | ICD-10-CM
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
## 2017-01-03 ENCOUNTER — Encounter: Payer: Self-pay | Admitting: Physical Therapy

## 2017-01-03 ENCOUNTER — Ambulatory Visit: Payer: Medicare Other | Admitting: Physical Therapy

## 2017-01-03 DIAGNOSIS — M25511 Pain in right shoulder: Secondary | ICD-10-CM | POA: Diagnosis not present

## 2017-01-03 DIAGNOSIS — G8929 Other chronic pain: Secondary | ICD-10-CM
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
## 2017-01-05 ENCOUNTER — Encounter: Payer: Self-pay | Admitting: Physical Therapy

## 2017-01-05 ENCOUNTER — Ambulatory Visit: Payer: Medicare Other | Admitting: Physical Therapy

## 2017-01-05 DIAGNOSIS — G8929 Other chronic pain: Secondary | ICD-10-CM

## 2017-01-05 DIAGNOSIS — M25511 Pain in right shoulder: Principal | ICD-10-CM
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
## 2017-01-10 ENCOUNTER — Encounter: Payer: Self-pay | Admitting: Physical Therapy

## 2017-01-10 ENCOUNTER — Ambulatory Visit: Payer: Medicare Other | Admitting: Physical Therapy

## 2017-01-10 DIAGNOSIS — M25511 Pain in right shoulder: Principal | ICD-10-CM

## 2017-01-10 DIAGNOSIS — G8929 Other chronic pain: Secondary | ICD-10-CM
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
## 2017-01-12 ENCOUNTER — Encounter: Payer: Self-pay | Admitting: Physical Therapy

## 2017-01-12 ENCOUNTER — Ambulatory Visit: Payer: Medicare Other | Admitting: Physical Therapy

## 2017-01-12 DIAGNOSIS — M25511 Pain in right shoulder: Principal | ICD-10-CM

## 2017-01-12 DIAGNOSIS — G8929 Other chronic pain: Secondary | ICD-10-CM
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
## 2017-01-16 ENCOUNTER — Encounter: Payer: Self-pay | Admitting: Physical Therapy

## 2017-01-16 ENCOUNTER — Ambulatory Visit: Payer: Medicare Other | Attending: Surgery | Admitting: Physical Therapy

## 2017-01-16 DIAGNOSIS — M25511 Pain in right shoulder: Secondary | ICD-10-CM | POA: Diagnosis not present

## 2017-01-16 DIAGNOSIS — G8929 Other chronic pain: Secondary | ICD-10-CM | POA: Insufficient documentation
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
## 2017-01-18 ENCOUNTER — Ambulatory Visit: Payer: Medicare Other | Admitting: Physical Therapy

## 2017-01-18 ENCOUNTER — Encounter: Payer: Self-pay | Admitting: Physical Therapy

## 2017-01-18 DIAGNOSIS — M25511 Pain in right shoulder: Secondary | ICD-10-CM | POA: Diagnosis not present

## 2017-01-18 DIAGNOSIS — G8929 Other chronic pain: Secondary | ICD-10-CM
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
## 2017-01-23 ENCOUNTER — Ambulatory Visit: Payer: Medicare Other | Attending: Surgery | Admitting: Physical Therapy

## 2017-01-23 ENCOUNTER — Encounter: Payer: Self-pay | Admitting: Physical Therapy

## 2017-01-23 DIAGNOSIS — G8929 Other chronic pain: Secondary | ICD-10-CM | POA: Insufficient documentation

## 2017-01-23 DIAGNOSIS — M25511 Pain in right shoulder: Secondary | ICD-10-CM | POA: Insufficient documentation
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
## 2017-01-25 ENCOUNTER — Ambulatory Visit: Payer: Medicare Other | Admitting: Physical Therapy

## 2017-01-25 ENCOUNTER — Encounter: Payer: Self-pay | Admitting: Physical Therapy

## 2017-01-25 DIAGNOSIS — M25511 Pain in right shoulder: Principal | ICD-10-CM

## 2017-01-25 DIAGNOSIS — G8929 Other chronic pain: Secondary | ICD-10-CM

## 2017-01-25 NOTE — Therapy (Addendum)
[place] [facility] MAIN REHAB SERVICES [address] [place], [place], [address] Phone: [phone]   Fax:  [phone]  Physical Therapy Treatment  Patient Details  Name: Sheryl Michael MRN: [id number] Date of Birth: 02/21/1931 Referring Provider: Dr. Poggi  Encounter Date: 01/25/2017      PT End of Session - 01/25/17 1023    Visit Number 12   Number of Visits 17   Date for PT Re-Evaluation 02/14/17   Authorization Type g codes 12/10   PT Start Time 1015   PT Stop Time 1100   PT Time Calculation (min) 45 min   Activity Tolerance Patient tolerated treatment well;No increased pain;Patient limited by fatigue   Behavior During Therapy [facility] for tasks assessed/performed      Past Medical History:  Diagnosis Date  . Breast cancer ([facility]) 2006   right breast ca with lumpectomy and rad tx  . Colon polyp   . Cystocele   . Depression   . Female bladder prolapse   . Goiter   . Hyperlipemia   . Procidentia of uterus   . Recurrent UTI   . Reflux   . TIA (transient ischemic attack)   . Vaginal atrophy     Past Surgical History:  Procedure Laterality Date  . APPENDECTOMY    . BREAST BIOPSY Right 2006   breast cancer  . BREAST SURGERY Right   . CYSTOCELE REPAIR N/A 12/21/2015   Procedure: ANTERIOR REPAIR (CYSTOCELE);  Surgeon: Martin A Defrancesco, MD;  Location: [facility] ORS;  Service: Gynecology;  Laterality: N/A;  . DILATION AND CURETTAGE OF UTERUS    . VAGINAL HYSTERECTOMY Bilateral 12/21/2015   Procedure: TVH BSO;  Surgeon: Martin A Defrancesco, MD;  Location: [facility] ORS;  Service: Gynecology;  Laterality: Bilateral;    There were no vitals filed for this visit.      Subjective Assessment - 01/25/17 1018    Subjective Patient arrives with no pain in her right arm unless she puts weight bearing on it or pressure to  use it.    Currently in Pain? No/denies   Pain Score 0-No pain   Multiple Pain Sites No        Therapeutic exercise: UBE x 5  minutes ( not billed), MH to thoracic musculature , cues for correct technique and posture AROM right shoulder flex, abd , IR/ER, horizontal add x 10,cues for correct technique and posture Scapula retraction x 20 x 2 with YTB,cues for correct technique and posture Scapula protraction x 20 x 2 with YTB,cues for correct technique and posture Scapula depression x 20 x 2,cues for correct technique and posture Standing arm ranger flex x 40, abd x 40 , horizontal abd x 40,cues for correct technique and posture Finger ladder x 10 Ball over the doorway x 10,cues for correct technique and posture Matrix scapula PNF D1, D2 with 7. 5 lbs x 10 x 2,cues for correct technique and posture    Treatment: STM to right shoulder scapula musculature including upper trap, supraspinatus, infraspinatus , teres minor  PA glides grade 2 and 3 T1-T12 x 30 bouts x 3  Patient has no reports of pain following therapy.                      PT Education - 01/25/17 1023    Education provided Yes   Education Details heat for shoulder and exercise progression   Person(s) Educated Patient   Methods Explanation   Comprehension Verbalized understanding               PT Long Term Goals - 01/18/17 1028      PT LONG TERM GOAL #1   Title Pt will report no pain with daily activities (i.e. reaching up to cabinet, showering, etc)   Baseline intermittent pain and no pain at times   Time 8   Period Weeks   Status Partially Met     PT LONG TERM GOAL #2   Title Pt will improve Quick Dash score by at least 8 pts to demonstrate improved functional use of RUE   Baseline 27.27   Time 8   Period Weeks   Status Partially Met     PT LONG TERM GOAL #3   Title Pt will improve R shoulder strength to 5/5 throughout with MMT to demonstrate improved strength and functoinal use of RUE   Baseline 4+/5   Time 8   Period Weeks   Status Partially Met     PT LONG TERM GOAL #4   Title Pt will demonstrate negative  Neers, Hawkins-Kennedy, and Horizontal Adduction tests for improved functional use of RUE and decreased pain   Baseline negative neers, + hawkins -kennedy and + horozontal add test   Time 8   Status Partially Met     PT LONG TERM GOAL #5   Title Pt's R shoulder AROM will be painfree and WNL to demonstrate improved functional use of RUE   Baseline painfree AROM right shoulder   Time 8   Period Weeks   Status Achieved               Plan - 01/25/17 1024    Clinical Impression Statement Patient demonstrates difficulty with right UE exercises that have weight bearing or a larger weight due to pain. She is able to perform moderate weights in open chain and closed chain patterns without increased pain.Strength of Damien Fusi is 4+/5.  Patient will continue to benefit from skilled PT to improve strength and decrease pain in right shoulder.    Rehab Potential Good   Clinical Impairments Affecting Rehab Potential (-) Chronic nature of RUE pain (+) positive results from prior therapy   PT Frequency 2x / week   PT Duration 8 weeks   PT Treatment/Interventions ADLs/Self Care Home Management;Aquatic Therapy;Cryotherapy;Electrical Stimulation;Iontophoresis 11m/ml Dexamethasone;Moist Heat;Ultrasound;Functional mobility training;Therapeutic activities;Therapeutic exercise;Neuromuscular re-education;Patient/family education;Manual techniques;Passive range of motion;Dry needling;Taping   PT Next Visit Plan Initiate RUE strengthening program; Assess HEP and progress mobility and posture   PT Home Exercise Plan UT stretch in sitting; repeated seated thoracic extension   Consulted and Agree with Plan of Care Patient      Patient will benefit from skilled therapeutic intervention in order to improve the following deficits and impairments:  Decreased range of motion, Decreased strength, Hypomobility, Increased fascial restricitons, Increased muscle spasms, Impaired perceived functional ability, Impaired  flexibility, Pain, Impaired UE functional use, Improper body mechanics, Postural dysfunction  Visit Diagnosis: Chronic right shoulder pain     Problem List Patient Active Problem List   Diagnosis Date Noted  . Shingles 12/29/2015  . S/P VH (vaginal hysterectomy) 12/22/2015  . Cystocele 12/21/2015  . Colon polyp 10/07/2015  . Bladder cystocele 10/07/2015  . Lower esophageal ring 10/07/2015  . H/O neoplasm 08/17/2015  . Malignant neoplasm of breast ([place] 04/01/2014  . Acid reflux 04/01/2014  . Big thyroid 04/01/2014  . HLD (hyperlipidemia) 04/01/2014  . OP (osteoporosis) 04/01/2014  . Temporary cerebral vascular dysfunction 04/01/2014  . H/O gastrointestinal disease 02/28/2014   KAlanson Puls PT, DPT MCall[place] KMinette HeadlandS  01/25/2017, 10:27 AM  [place] MAIN [facility] SERVICES [address] [place], [place], [address] Phone: [phone]   Fax:  [phone]  Name: Sheryl Michael MRN: [id number] Date of Birth: 1931/05/15

## 2017-01-30 ENCOUNTER — Encounter: Payer: Self-pay | Admitting: Physical Therapy

## 2017-01-30 ENCOUNTER — Ambulatory Visit: Payer: Medicare Other | Admitting: Physical Therapy

## 2017-01-30 DIAGNOSIS — M25511 Pain in right shoulder: Principal | ICD-10-CM

## 2017-01-30 DIAGNOSIS — G8929 Other chronic pain: Secondary | ICD-10-CM
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
## 2017-02-01 ENCOUNTER — Ambulatory Visit: Payer: Medicare Other | Attending: Surgery | Admitting: Physical Therapy

## 2017-02-01 ENCOUNTER — Encounter: Payer: Self-pay | Admitting: Physical Therapy

## 2017-02-01 DIAGNOSIS — M25511 Pain in right shoulder: Secondary | ICD-10-CM | POA: Insufficient documentation

## 2017-02-01 DIAGNOSIS — G8929 Other chronic pain: Secondary | ICD-10-CM | POA: Diagnosis present
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
## 2017-02-06 ENCOUNTER — Encounter: Payer: Self-pay | Admitting: Physical Therapy

## 2017-02-06 ENCOUNTER — Ambulatory Visit: Payer: Medicare Other | Admitting: Physical Therapy

## 2017-02-06 DIAGNOSIS — G8929 Other chronic pain: Secondary | ICD-10-CM

## 2017-02-06 DIAGNOSIS — M25511 Pain in right shoulder: Secondary | ICD-10-CM | POA: Diagnosis not present
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
## 2017-02-08 ENCOUNTER — Encounter: Payer: Self-pay | Admitting: Physical Therapy

## 2017-02-08 ENCOUNTER — Ambulatory Visit: Payer: Medicare Other | Admitting: Physical Therapy

## 2017-02-08 DIAGNOSIS — M25511 Pain in right shoulder: Secondary | ICD-10-CM | POA: Diagnosis not present

## 2017-02-08 DIAGNOSIS — G8929 Other chronic pain: Secondary | ICD-10-CM
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
## 2017-03-07 ENCOUNTER — Other Ambulatory Visit: Payer: Self-pay | Admitting: Internal Medicine

## 2017-03-07 DIAGNOSIS — N644 Mastodynia: Secondary | ICD-10-CM

## 2017-03-10 ENCOUNTER — Other Ambulatory Visit: Payer: Self-pay | Admitting: Internal Medicine

## 2017-03-10 ENCOUNTER — Ambulatory Visit
Admission: RE | Admit: 2017-03-10 | Discharge: 2017-03-10 | Disposition: A | Discharge status: Home / Self Care | Payer: Medicare Other | Source: Ambulatory Visit | Attending: Internal Medicine | Admitting: Internal Medicine

## 2017-03-10 DIAGNOSIS — N644 Mastodynia: Secondary | ICD-10-CM

## 2017-04-14 ENCOUNTER — Encounter: Payer: Self-pay | Admitting: Emergency Medicine

## 2017-04-14 ENCOUNTER — Emergency Department: Payer: Medicare Other

## 2017-04-14 ENCOUNTER — Emergency Department
Admission: EM | Admit: 2017-04-14 | Discharge: 2017-04-14 | Disposition: A | Discharge status: Home / Self Care | Payer: Medicare Other | Attending: Emergency Medicine | Admitting: Emergency Medicine

## 2017-04-14 DIAGNOSIS — Z853 Personal history of malignant neoplasm of breast: Secondary | ICD-10-CM | POA: Insufficient documentation

## 2017-04-14 DIAGNOSIS — R55 Syncope and collapse: Secondary | ICD-10-CM | POA: Insufficient documentation

## 2017-04-14 DIAGNOSIS — R609 Edema, unspecified: Secondary | ICD-10-CM

## 2017-04-14 DIAGNOSIS — Z79899 Other long term (current) drug therapy: Secondary | ICD-10-CM | POA: Insufficient documentation

## 2017-04-14 DIAGNOSIS — R2242 Localized swelling, mass and lump, left lower limb: Secondary | ICD-10-CM | POA: Insufficient documentation

## 2017-04-14 LAB — CBC
HEMATOCRIT: 42.5 % (ref 35.0–47.0)
Hemoglobin: 14.2 g/dL (ref 12.0–16.0)
MCH: 32.3 pg (ref 26.0–34.0)
MCHC: 33.3 g/dL (ref 32.0–36.0)
MCV: 97 fL (ref 80.0–100.0)
PLATELETS: 186 10*3/uL (ref 150–440)
RBC: 4.38 MIL/uL (ref 3.80–5.20)
RDW: 14.5 % (ref 11.5–14.5)
WBC: 6.1 10*3/uL (ref 3.6–11.0)

## 2017-04-14 LAB — BASIC METABOLIC PANEL
Anion gap: 7 (ref 5–15)
BUN: 21 mg/dL — AB (ref 6–20)
CO2: 29 mmol/L (ref 22–32)
Calcium: 8.9 mg/dL (ref 8.9–10.3)
Chloride: 106 mmol/L (ref 101–111)
Creatinine, Ser: 1.21 mg/dL — ABNORMAL HIGH (ref 0.44–1.00)
GFR calc Af Amer: 47 mL/min — ABNORMAL LOW (ref >60)
GFR, EST NON AFRICAN AMERICAN: 40 mL/min — AB (ref >60)
Glucose, Bld: 71 mg/dL (ref 65–99)
POTASSIUM: 4.4 mmol/L (ref 3.5–5.1)
SODIUM: 142 mmol/L (ref 135–145)

## 2017-04-14 LAB — TROPONIN I

## 2017-04-14 MED ORDER — SODIUM CHLORIDE 0.9 % IV BOLUS (SEPSIS)
500.0000 mL | Freq: Once | INTRAVENOUS | Status: AC
  Administered 2017-04-14: 500 mL via INTRAVENOUS

## 2017-04-14 NOTE — ED Provider Notes (Signed)
[facility] Emergency Department Provider Note   ____________________________________________   I have reviewed the triage vital signs and the nursing notes.   HISTORY  Chief Complaint Loss of Consciousness   History limited by: Not Limited   HPI Sheryl Michael is a 83 y.o. female who presents to the emergency department today after having a syncopal episode. Patient is also complaining of left ankle cramping and swelling. The patient states she has a history of intermittent left ankle cramping. This has been going on for quite some time. However today she noticed some associated swelling to her left ankle. The cramping was bad this morning. She went to go to the kitchen when she started feeling lightheaded. She felt like her vision was going back and she sat down. Her head on the table. She then passed out. She is unsure how long she was out for. The patient denies any concurrent chest pain, palpitations or skipped beats. Patient denies any recent illness.   Past Medical History:  Diagnosis Date  . Breast cancer ([place]) 2006   right breast ca with lumpectomy and rad tx  . Colon polyp   . Cystocele   . Depression   . Female bladder prolapse   . Goiter   . Hyperlipemia   . Procidentia of uterus   . Recurrent UTI   . Reflux   . TIA (transient ischemic attack)   . Vaginal atrophy     Patient Active Problem List   Diagnosis Date Noted  . Shingles 12/29/2015  . S/P VH (vaginal hysterectomy) 12/22/2015  . Cystocele 12/21/2015  . Colon polyp 10/07/2015  . Bladder cystocele 10/07/2015  . Lower esophageal ring 10/07/2015  . H/O neoplasm 08/17/2015  . Malignant neoplasm of breast ([place]) 04/01/2014  . Acid reflux 04/01/2014  . Big thyroid 04/01/2014  . HLD (hyperlipidemia) 04/01/2014  . OP (osteoporosis) 04/01/2014  . Temporary cerebral vascular dysfunction 04/01/2014  . H/O gastrointestinal disease 02/28/2014    Past Surgical History:  Procedure  Laterality Date  . APPENDECTOMY    . BREAST BIOPSY Right 2006   breast cancer  . BREAST SURGERY Right   . CYSTOCELE REPAIR N/A 12/21/2015   Procedure: ANTERIOR REPAIR (CYSTOCELE);  Surgeon: Brayton Mars, MD;  Location: [facility] ORS;  Service: Gynecology;  Laterality: N/A;  . DILATION AND CURETTAGE OF UTERUS    . VAGINAL HYSTERECTOMY Bilateral 12/21/2015   Procedure: TVH BSO;  Surgeon: Brayton Mars, MD;  Location: [facility] ORS;  Service: Gynecology;  Laterality: Bilateral;    Prior to Admission medications   Medication Sig Start Date End Date Taking? Authorizing Provider  calcium carbonate (OSCAL) 1500 (600 CA) MG TABS tablet Take by mouth 2 (two) times daily with a meal.    [provider]  escitalopram (LEXAPRO) 10 MG tablet Take 10 mg by mouth.    [provider]  levothyroxine (SYNTHROID, LEVOTHROID) 50 MCG tablet Take 50 mcg by mouth daily before breakfast.    [provider]  omeprazole (PRILOSEC) 20 MG capsule Take 20 mg by mouth daily.    [provider]    Allergies Iodinated diagnostic agents; Codeine; Phenobarbital; and Penicillin v potassium  Family History  Problem Relation Age of Onset  . Diabetes Sister   . Breast cancer Sister        late 23's  . Diabetes Brother     Social History Social History  Substance Use Topics  . Smoking status: Never Smoker  . Smokeless tobacco: Never  Used  . Alcohol use No    Review of Systems Constitutional: No fever/chills Eyes: No visual changes. ENT: No sore throat. Cardiovascular: Denies chest pain. Respiratory: Denies shortness of breath. Gastrointestinal: No abdominal pain.  No nausea, no vomiting.  No diarrhea.   Genitourinary: Negative for dysuria. Musculoskeletal: Positive for left ankle cramping and swelling. Skin: Negative for rash. Neurological: Negative for headaches, focal weakness or numbness.  ____________________________________________   PHYSICAL EXAM:  VITAL  SIGNS: ED Triage Vitals  Enc Vitals Group     BP 04/14/17 1233 (!) 116/41     Pulse Rate 04/14/17 1233 (!) 59     Resp 04/14/17 1233 16     Temp 04/14/17 1233 98.5 F (36.9 C)     Temp Source 04/14/17 1233 Oral     SpO2 04/14/17 1233 99 %     Weight 04/14/17 1229 125 lb (56.7 kg)   Constitutional: Alert and oriented. Well appearing and in no distress. Eyes: Conjunctivae are normal.  ENT   Head: Normocephalic and atraumatic.   Nose: No congestion/rhinnorhea.   Mouth/Throat: Mucous membranes are moist.   Neck: No stridor. Hematological/Lymphatic/Immunilogical: No cervical lymphadenopathy. Cardiovascular: Normal rate, regular rhythm.  No murmurs, rubs, or gallops.  Respiratory: Normal respiratory effort without tachypnea nor retractions. Breath sounds are clear and equal bilaterally. No wheezes/rales/rhonchi. Gastrointestinal: Soft and non tender. No rebound. No guarding.  Genitourinary: Deferred Musculoskeletal: Normal range of motion in all extremities. No lower extremity edema. Neurologic:  Normal speech and language. No gross focal neurologic deficits are appreciated.  Skin:  Skin is warm, dry and intact. No rash noted. Psychiatric: Mood and affect are normal. Speech and behavior are normal. Patient exhibits appropriate insight and judgment.  ____________________________________________    LABS (pertinent positives/negatives)  Labs Reviewed  BASIC METABOLIC PANEL - Abnormal; Notable for the following:       Result Value   BUN 21 (*)    Creatinine, Ser 1.21 (*)    GFR calc non Af Amer 40 (*)    GFR calc Af Amer 47 (*)    All other components within normal limits  CBC  TROPONIN I  TROPONIN I  URINALYSIS, COMPLETE (UACMP) WITH MICROSCOPIC  CBG MONITORING, ED     ____________________________________________   EKG  I, Nance Pear, attending physician, personally viewed and interpreted this EKG  EKG Time: 1236 Rate: 59 Rhythm: sinus  bradycardia Axis: normal Intervals: qtc 453 QRS: narrow ST changes: no st elevation Impression: abnormal ekg  ____________________________________________    RADIOLOGY  [place] LE left  IMPRESSION: No evidence of DVT within the left lower extremity.  ____________________________________________   PROCEDURES  Procedures  ____________________________________________   INITIAL IMPRESSION / ASSESSMENT AND PLAN / ED COURSE  Pertinent labs & imaging results that were available during my care of the patient were reviewed by me and considered in my medical decision making (see chart for details).  ----------------------------------------- 3:00 PM on 04/14/2017 -----------------------------------------  Patient presented to the emergency department today because of concerns for syncopal episode. Also some concerns for left ankle cramping and swelling. Given history of syncopal episodes would have some concern for ACS. Additionally would have concerns for possible electrolyte abnormality or potentially given left leg swelling and pain PE. Will get ultrasound to evaluate for DVT. Will plan on initial troponin likely secondary to initial negative.  OBSERVATION CARE: This patient is being placed under observation care for the following reasons: Syncope with repeat testing to rule out ischemia    ----------------------------------------- 4:28  PM on 04/14/2017 -----------------------------------------  Patient states that she continues to feel okay. No new pain.  ----------------------------------------- 6:45 PM on 04/14/2017 -----------------------------------------   END OF OBSERVATION STATUS: After an appropriate period of observation, this patient is being discharged due to the following reason(s):  Repeat negative troponin. Patient continues to feel well. Discussed return precautions with patient and importance of following up with primary care physician.    ____________________________________________   FINAL CLINICAL IMPRESSION(S) / ED DIAGNOSES  Final diagnoses:  Swelling  Syncope, unspecified syncope type     Note: This dictation was prepared with Dragon dictation. Any transcriptional errors that result from this process are unintentional     Nance Pear, MD 04/14/17 1845

## 2017-04-14 NOTE — Discharge Instructions (Signed)
Please seek medical attention for any high fevers, chest pain, shortness of breath, change in behavior, persistent vomiting, bloody stool or any other new or concerning symptoms.  

## 2017-04-14 NOTE — ED Triage Notes (Signed)
Pt to ED via [place] with daughter. Pt states that this morning when she woke up she was having cramps in her left leg, when she went into the kitchen to get something for the cramps, she started feeling sick on her stomach and then everything started going black. Pt states that she sat down and when she came to what she was holding in her hand had fallen in the floor.  Pt denies any other symptoms. States that she feels ok now. Has never had a hx/o of syncope.

## 2017-04-14 NOTE — ED Notes (Signed)
Waiting on U/S

## 2017-04-14 NOTE — ED Notes (Signed)
No complaints. Dr. Archie Balboa into see patient, spoke with patient and daughter

## 2017-05-17 ENCOUNTER — Ambulatory Visit: Payer: Medicare Other | Attending: Surgery | Admitting: Physical Therapy

## 2017-05-17 ENCOUNTER — Encounter: Payer: Self-pay | Admitting: Physical Therapy

## 2017-05-17 DIAGNOSIS — M6281 Muscle weakness (generalized): Secondary | ICD-10-CM | POA: Insufficient documentation

## 2017-05-17 DIAGNOSIS — M25511 Pain in right shoulder: Secondary | ICD-10-CM | POA: Insufficient documentation

## 2017-05-17 DIAGNOSIS — G8929 Other chronic pain: Secondary | ICD-10-CM | POA: Diagnosis present

## 2017-05-17 NOTE — Therapy (Addendum)
[place] MAIN [facility] SERVICES [address] [place], [place], [address] Phone: [phone]   Fax:  [phone]  Physical Therapy Evaluation  Patient Details  Name: Sheryl Michael MRN: [id number] Date of Birth: [birth year] Referring Provider: Corky Mull   Encounter Date: 05/17/80      PT End of Session - 05/17/17 0762    Visit Number 1   Number of Visits 17   Date for PT Re-Evaluation 06/17/17   Authorization Type 1/10 g codes   PT Start Time 0800   PT Stop Time 0900   PT Time Calculation (min) 60 min   Activity Tolerance Patient limited by pain   Behavior During Therapy [facility] for tasks assessed/performed      Past Medical History:  Diagnosis Date  . Breast cancer ([place]) 2006   right breast ca with lumpectomy and rad tx  . Colon polyp   . Cystocele   . Depression   . Female bladder prolapse   . Goiter   . Hyperlipemia   . Procidentia of uterus   . Recurrent UTI   . Reflux   . TIA (transient ischemic attack)   . Vaginal atrophy     Past Surgical History:  Procedure Laterality Date  . APPENDECTOMY    . BREAST BIOPSY Right 2006   breast cancer  . BREAST SURGERY Right   . CYSTOCELE REPAIR N/A 12/21/2015   Procedure: ANTERIOR REPAIR (CYSTOCELE);  Surgeon: Brayton Mars, MD;  Location: [facility] ORS;  Service: Gynecology;  Laterality: N/A;  . DILATION AND CURETTAGE OF UTERUS    . VAGINAL HYSTERECTOMY Bilateral 12/21/2015   Procedure: TVH BSO;  Surgeon: Brayton Mars, MD;  Location: [facility] ORS;  Service: Gynecology;  Laterality: Bilateral;    There were no vitals filed for this visit.       Subjective Assessment - 05/17/17 0806    Subjective Patient describes aching/throbbing 2/10 pain in R shoulder that increases with movement to 8/10 pain.     Pertinent History is experiencing pain and stiffness in her R shoulder due to falling 2-3 weeks ago. The MD visit resulted in referral for physical therapy after x ray was  negative for fractures. patient states her R shoulder is feeling worse; history of R rotator cuff tendonitis that was resolved with 2 months of physical therapy earlier this year. Patient completed PT for R shoulder pain from 3/18 - 4/18 with good results and describes 0/10 pain once discharged.  Patient recalls falling on her R shoulder 2-3 weeks ago when walking her daughter's dog due to foot getting caught on cement threshold. Patient describes hitting her head but suffered no further injuries; R shoulder is now worse than before. Patient describes tingling when R shoulder is over-worked and complains of occasional aching 1/10 pain in R upper trap and neck. Patient went to MD and MD referred her to physical therapy.    Limitations Reading;Sitting   How long can you sit comfortably? Unlimited   How long can you stand comfortably? Unlimted   How long can you walk comfortably? Unlimited   Diagnostic tests X-ray: negative for fractures   Patient Stated Goals Decrease pain, to be able to raise R hand above head, and return to working in the yard   Currently in Pain? Yes   Pain Score 7    Pain Location Shoulder   Pain Orientation Right   Pain Descriptors / Indicators Aching;Throbbing   Pain Type Acute pain  Pain Radiating Towards R tricep   Pain Onset 1 to 4 weeks ago   Pain Frequency Constant   Aggravating Factors  Lifting, reaching, raising arm overhead   Pain Relieving Factors rest and heating pad   Effect of Pain on Daily Activities Unable to perform recreational activities and reaching overhead   Multiple Pain Sites Yes   Pain Score 1   Pain Location Neck   Pain Orientation Right   Pain Descriptors / Indicators Aching;Throbbing      PAIN:  2/10 in R shoulder during rest. 8/10 with overhead movement Pain with flexion, abduction, scaption, and IR    Palpation: Moderate trigger point in R upper trap and insertion point of supraspinatus,  Moderate stiffness in anterior direction of  C4-C7 and T2-T5; tenderness decreased with more mobs. No significant tenderness in paraspinals of thoracic and cervical region, rhomboids, bicep/bicep tendon, and infraspinatus.  POSTURE: Rounded shoulders bilaterally and forward head. Easily corrected    AROM: L Shoulder:  flex 145 degrees abd 150 degrees ER with elbow flexed 90 degrees WFL Ext WFL  R Shoulder: flex 100 degrees; pain at end range abd 5 degrees; pain ER with elbow flexed 90 degrees WFL; no pain Ext WFL; no pain  Cervical AROM: Lat flexion bilaterally 5 degrees Ext= 50 degrees Flex = 60 degrees Rot bilaterally WFL    PROM: R:  flex: 145; pain at end range abd 90; pain throughout motion Inferior glide = moderately stiff; no pain Posterior glide = mildly stiff; no pain   STRENGTH:  Graded on a 0-5 scale   Muscle Group Left Right  Shoulder flex 4+/5 3/5  Shoulder Abd 4+/5 2/5  Shoulder Ext 4+/5 4+/5  Shoulder IR/ER 4+/5 3+/5 4+/5  Elbow Flex 4-/5 Flex 4-/5  Wrist/hand 4+/5 4+/5                                       SENSATION: Intact bilaterally   SPECIAL TESTS: R shoulder Hawkins positive Empty can negative: unable to fully test due to pain with scaption  Sacpular assist test: negative (still painful)   FUNCTIONAL MOBILITY: Unable to perform hand behind back. Cannot lift overhead past 90 degrees   OUTCOME MEASURES: TEST Outcome Interpretation      Quick DASH 68% 68% disabled : 0% is WNL                            [facility] PT Assessment - 05/17/17 0818      Assessment   Medical Diagnosis R shoulder pain   Referring Provider Corky Mull    Onset Date/Surgical Date 05/17/17   Hand Dominance Right   Prior Therapy yes, R shoulder tendonitis     Precautions   Precautions None     Balance Screen   Has the patient fallen in the past 6 months Yes   How many times? 2   Has the patient had a decrease in activity level because of a fear of falling?  Yes   Is the patient  reluctant to leave their home because of a fear of falling?  Yes     [place] residence   Living Arrangements Alone   Available Help at Discharge Family;Friend(s)   Type of [place] to enter   Entrance Stairs-Number of Steps 3  Entrance Stairs-Rails Can reach both   Home Layout One level   Home Equipment None     Prior Function   Level of Independence Independent   Vocation Retired   Leisure gardening, exercise, walker      Cognition   Overall Cognitive Status Within Functional Limits for tasks assessed      Treatment: Cervical stretching upper traps bilaterally with 30 sec hold x 3 reps No reports of increased pain following treatment      .                  PT Education - 05/17/17 [phone]    Education provided Yes   Education Details On symptom reduction, POC, and decreasing neck stiffness   Person(s) Educated Patient   Methods Explanation;Demonstration   Comprehension Verbalized understanding;Returned demonstration             PT Long Term Goals - 05/17/17 0941      PT LONG TERM GOAL #1   Title Pt will report no pain with daily activities (i.e. reaching up to cabinet, showering, etc)   Baseline 8/10 pain when reaching overhead   Time 8   Period Weeks   Status New   Target Date 07/12/17     PT LONG TERM GOAL #2   Title Pt will improve Quick Dash score by at least 8 pts to demonstrate improved functional use of RUE   Baseline 68%    Time 8   Period Weeks   Status New   Target Date 07/12/17     PT LONG TERM GOAL #3   Title Pt will improve R shoulder strength to 5/5 throughout with MMT to demonstrate improved strength and functoinal use of RUE   Baseline Abduction = 2/5  flexion = 3/5 IR = 3+/5   Time 8   Period Weeks   Status New   Target Date 07/12/17     PT LONG TERM GOAL #4   Title Pt's R shoulder AROM will be painfree and WNL to demonstrate improved functional use of  RUE   Baseline R flexion painful = 100 degrees (L = 145 degrees) R abduction painful = 5 degrees (L abduction = 150 degrees)   Time 8   Period Weeks   Status New   Target Date 07/12/17     PT LONG TERM GOAL #5   Title --   Baseline --   Time --   Period --   Status --   Target Date --                Plan - 05/17/17 0921    Clinical Impression Statement Patient is an 83 year old female who is experiencing pain and stiffness in her R shoulder due to falling 2-3 weeks ago.  Patient has constant aching 2/10 pain that increases to 8/10 with overhead movement; especially during eccentric lowering.  Patient shows severe limitations in right shoulder AROM abduction and flexion  due to pain in rotator cuff. Patient has + Hawkins test in right shoulder, and has increased R scapular winging when resting and during scapular retraction. ROM reveals lateral flexion of cervical spine severely limited with trigger point in R upper trap.  Patient has moderate stiffness and pain during passive acessory motions in cervical and thoracic spine.  Patient would benefit from benefit from skilled physical therapy to improve ROM and reduce pain in R shoulder to increase ability to perform overhead activities without difficulty.    History  and Personal Factors relevant to plan of care: This patient presents with 2 personal factors/ comorbidities; age and falls and 3 body elements including body structures and functions, activity limitations and or participation restrictions; decreased ROM, increased pain, decreased strength. Patient's condition is evolving.   Clinical Presentation Evolving   Clinical Presentation due to: R shoulder pain getting worse and pain with all overhead movements   Clinical Decision Making Moderate   Rehab Potential Good   Clinical Impairments Affecting Rehab Potential Decreased ROM and decreased strength   PT Frequency 2x / week   PT Duration 8 weeks   PT Treatment/Interventions  ADLs/Self Care Home Management;Biofeedback;Cryotherapy;Electrical Stimulation;Moist Heat;Gait training;Stair training;Functional mobility training;Therapeutic activities;Therapeutic exercise;Neuromuscular re-education;Patient/family education;Energy conservation;Manual techniques;Passive range of motion;Dry needling   PT Next Visit Plan Implement ROM exercises and manual therapy   PT Home Exercise Plan UT stretch and avoiding aggravating factors   Consulted and Agree with Plan of Care Patient      Patient will benefit from skilled therapeutic intervention in order to improve the following deficits and impairments:  Decreased range of motion, Impaired tone, Decreased activity tolerance, Pain, Hypomobility, Impaired flexibility, Improper body mechanics, Decreased mobility, Decreased strength  Visit Diagnosis: Chronic right shoulder pain      G-Codes - 31-May-2017 1153    Functional Limitation Mobility: Walking and moving around   Mobility: Walking and Moving Around Current Status [phone]) At least 60 percent but less than 80 percent impaired, limited or restricted   Mobility: Walking and Moving Around Goal Status [phone]) At least 40 percent but less than 60 percent impaired, limited or restricted       Problem List Patient Active Problem List   Diagnosis Date Noted  . Shingles 12/29/2015  . S/P VH (vaginal hysterectomy) 12/22/2015  . Cystocele 12/21/2015  . Colon polyp 10/07/2015  . Bladder cystocele 10/07/2015  . Lower esophageal ring 10/07/2015  . H/O neoplasm 08/17/2015  . Malignant neoplasm of breast ([place]) 04/01/2014  . Acid reflux 04/01/2014  . Big thyroid 04/01/2014  . HLD (hyperlipidemia) 04/01/2014  . OP (osteoporosis) 04/01/2014  . Temporary cerebral vascular dysfunction 04/01/2014  . H/O gastrointestinal disease 02/28/2014  This entire session was performed under direct supervision and direction of a licensed Chiropractor . I have personally read, edited  and approve of the note as written. [facility] [address], [place], [place] DPT 05-31-17, 12:08 PM  [place] MAIN [facility] SERVICES [address] [place], [place], [address] Phone: [phone]   Fax:  [phone]  Name: MILAGROS MIDDENDORF MRN: [id number] Date of Birth: Mar 04, 1931

## 2017-05-22 ENCOUNTER — Encounter: Payer: Self-pay | Admitting: Physical Therapy

## 2017-05-22 ENCOUNTER — Ambulatory Visit: Payer: Medicare Other | Admitting: Physical Therapy

## 2017-05-22 DIAGNOSIS — M25511 Pain in right shoulder: Secondary | ICD-10-CM | POA: Diagnosis not present

## 2017-05-22 DIAGNOSIS — M6281 Muscle weakness (generalized): Secondary | ICD-10-CM

## 2017-05-22 DIAGNOSIS — G8929 Other chronic pain: Secondary | ICD-10-CM

## 2017-05-22 NOTE — Therapy (Signed)
[place] MAIN [facility] SERVICES [address] [place], [place], [address] Phone: [phone]   Fax:  [phone]  Physical Therapy Treatment  Patient Details  Name: Sheryl Michael MRN: [id number] Date of Birth: 1931-06-22 Referring Provider: Corky Mull   Encounter Date: 05/22/2017      PT End of Session - 05/22/17 0855    Visit Number 2   Number of Visits 17   Date for PT Re-Evaluation 06/17/17   Authorization Type 2/10 g codes   PT Start Time 0845   PT Stop Time 0930   PT Time Calculation (min) 45 min   Activity Tolerance Patient limited by pain   Behavior During Therapy [facility] for tasks assessed/performed      Past Medical History:  Diagnosis Date  . Breast cancer ([place]) 2006   right breast ca with lumpectomy and rad tx  . Colon polyp   . Cystocele   . Depression   . Female bladder prolapse   . Goiter   . Hyperlipemia   . Procidentia of uterus   . Recurrent UTI   . Reflux   . TIA (transient ischemic attack)   . Vaginal atrophy     Past Surgical History:  Procedure Laterality Date  . APPENDECTOMY    . BREAST BIOPSY Right 2006   breast cancer  . BREAST SURGERY Right   . CYSTOCELE REPAIR N/A 12/21/2015   Procedure: ANTERIOR REPAIR (CYSTOCELE);  Surgeon: Brayton Mars, MD;  Location: [facility] ORS;  Service: Gynecology;  Laterality: N/A;  . DILATION AND CURETTAGE OF UTERUS    . VAGINAL HYSTERECTOMY Bilateral 12/21/2015   Procedure: TVH BSO;  Surgeon: Brayton Mars, MD;  Location: [facility] ORS;  Service: Gynecology;  Laterality: Bilateral;    There were no vitals filed for this visit.      Subjective Assessment - 05/22/17 0852    Subjective Patient says her shoulder still hurts with movement and his having more discomfot in her R upper trap/neck.   Pertinent History is experiencing pain and stiffness in her R shoulder due to falling 2-3 weeks ago. The MD visit resulted in referral for physical therapy after x ray was  negative for fractures. patient states her R shoulder is feeling worse; history of R rotator cuff tendonitis that was resolved with 2 months of physical therapy earlier this year. Patient completed PT for R shoulder pain from 3/18 - 4/18 with good results and describes 0/10 pain once discharged.  Patient recalls falling on her R shoulder 2-3 weeks ago when walking her daughter's dog due to foot getting caught on cement threshold. Patient describes hitting her head but suffered no further injuries; R shoulder is now worse than before. Patient describes tingling when R shoulder is over-worked and complains of occasional aching 1/10 pain in R upper trap and neck. Patient went to MD and MD referred her to physical therapy.    Limitations Reading;Sitting   How long can you sit comfortably? Unlimited   How long can you stand comfortably? Unlimted   How long can you walk comfortably? Unlimited   Diagnostic tests X-ray: negative for fractures   Patient Stated Goals Decrease pain, to be able to raise R hand above head, and return to working in the yard   Currently in Pain? No/denies   Pain Score 0-No pain       Treatment:  Heat pack on R shoulder in sitting  STM and contract-relax to R upper trap in supine  8 minutes. STM to distal pec major fibers with shoulder at ~90 degrees abduction 5 minutes. Significant trigger points.  PROM abduction and flexion 15 repetitions in pain-free ROM. Patient able to get to ~145 degrees of flexion and ~90 degrees of abduction AAROM flexion with stick 10x. Painful at beginning of ROM and end of ROM.  PNF for scapular retraction and depression in sidelying. Isometric and eccentric control of scapula; resulted in good fatigue of scapular retractors. AAROM with stick retested with scap cueing and resulted in good pain relief.   Pec doorway stretch 3 x 30 seconds. Patient instructed to relax pec muscles and to "open up the collar bones". Moderate stretch felt. Postual  correction (anatomical position) against door 10 x 10 seconds. Good fatigue of posterior musculature and mild stretch of pec major/minor.  HEP given; doorway pec stretch and postural correction understood well. Good pain relief and significant gains in ROM today after STM and scapular re-training.                            PT Education - 05/22/17 0854    Education provided Yes   Education Details Continue to move shoulder with good mechanics   Person(s) Educated Patient   Methods Explanation   Comprehension Verbalized understanding             PT Long Term Goals - 05/17/17 0941      PT LONG TERM GOAL #1   Title Pt will report no pain with daily activities (i.e. reaching up to cabinet, showering, etc)   Baseline 8/10 pain when reaching overhead   Time 8   Period Weeks   Status New   Target Date 07/12/17     PT LONG TERM GOAL #2   Title Pt will improve Quick Dash score by at least 8 pts to demonstrate improved functional use of RUE   Baseline 68%    Time 8   Period Weeks   Status New   Target Date 07/12/17     PT LONG TERM GOAL #3   Title Pt will improve R shoulder strength to 5/5 throughout with MMT to demonstrate improved strength and functoinal use of RUE   Baseline Abduction = 2/5  flexion = 3/5 IR = 3+/5   Time 8   Period Weeks   Status New   Target Date 07/12/17     PT LONG TERM GOAL #4   Title Pt's R shoulder AROM will be painfree and WNL to demonstrate improved functional use of RUE   Baseline R flexion painful = 100 degrees (L = 145 degrees) R abduction painful = 5 degrees (L abduction = 150 degrees)   Time 8   Period Weeks   Status New   Target Date 07/12/17     PT LONG TERM GOAL #5   Title --   Baseline --   Time --   Period --   Status --   Target Date --               Plan - 05/22/17 6767    Clinical Impression Statement Patient displays impaired scapulo-humeral rhythm that is causing impingement symptoms with  overhead ROM. Patient was instructed on proper scapular stabilization techniques and resulted in increased ROM and decreased pain.  Patient described good fatigue in middle trap and rhomboids with exercises. Sginificant trigger points in R upper trap and R pec major reduced with STM and amnual techniques. Good  increase in ROM and decreased pain today. Patient will continue to benefit from skilled physical therapy to increase ROM of R shoulder to improve functional mobility to reduce difficulty with overhead movements.    Rehab Potential Good   Clinical Impairments Affecting Rehab Potential Decreased ROM and decreased strength   PT Frequency 2x / week   PT Duration 8 weeks   PT Treatment/Interventions ADLs/Self Care Home Management;Biofeedback;Cryotherapy;Electrical Stimulation;Moist Heat;Gait training;Stair training;Functional mobility training;Therapeutic activities;Therapeutic exercise;Neuromuscular re-education;Patient/family education;Energy conservation;Manual techniques;Passive range of motion;Dry needling   PT Next Visit Plan manual therapy, scap stability, and ROM   PT Home Exercise Plan pec doorway stretch and postural correction exercise   Consulted and Agree with Plan of Care Patient      Patient will benefit from skilled therapeutic intervention in order to improve the following deficits and impairments:  Decreased range of motion, Impaired tone, Decreased activity tolerance, Pain, Hypomobility, Impaired flexibility, Improper body mechanics, Decreased mobility, Decreased strength  Visit Diagnosis: Chronic right shoulder pain  Muscle weakness (generalized)     Problem List Patient Active Problem List   Diagnosis Date Noted  . Shingles 12/29/2015  . S/P VH (vaginal hysterectomy) 12/22/2015  . Cystocele 12/21/2015  . Colon polyp 10/07/2015  . Bladder cystocele 10/07/2015  . Lower esophageal ring 10/07/2015  . H/O neoplasm 08/17/2015  . Malignant neoplasm of breast ([place])  04/01/2014  . Acid reflux 04/01/2014  . Big thyroid 04/01/2014  . HLD (hyperlipidemia) 04/01/2014  . OP (osteoporosis) 04/01/2014  . Temporary cerebral vascular dysfunction 04/01/2014  . H/O gastrointestinal disease 02/28/2014  This entire session was performed under direct supervision and direction of a licensed Chiropractor . I have personally read, edited and approve of the note as written. Sheliah Plane SPT  [place], [place], DPT 05/22/2017, 9:48 AM  [place] MAIN [facility] SERVICES [address] [place], [place], [address] Phone: [phone]   Fax:  [phone]  Name: Sheryl Michael MRN: [id number] Date of Birth: 03/25/1931

## 2017-05-24 ENCOUNTER — Ambulatory Visit: Payer: Medicare Other

## 2017-05-24 DIAGNOSIS — M25511 Pain in right shoulder: Principal | ICD-10-CM

## 2017-05-24 DIAGNOSIS — G8929 Other chronic pain: Secondary | ICD-10-CM

## 2017-05-24 DIAGNOSIS — M6281 Muscle weakness (generalized): Secondary | ICD-10-CM

## 2017-05-24 NOTE — Therapy (Signed)
[place] MAIN [facility] SERVICES [address] [place], [place], [address] Phone: [phone]   Fax:  [phone]  Physical Therapy Treatment  Patient Details  Name: Sheryl Michael MRN: [id number] Date of Birth: [birth year] Referring Provider: Corky Mull   Encounter Date: 05/24/2017      PT End of Session - 05/24/17 0935    Visit Number 3   Number of Visits 17   Date for PT Re-Evaluation 07/12/17   Authorization Type 3/10 g codes  07/03/2017 revuew goals`   PT Start Time 0900   PT Stop Time 0945   PT Time Calculation (min) 45 min   Activity Tolerance Patient limited by pain   Behavior During Therapy [facility] for tasks assessed/performed      Past Medical History:  Diagnosis Date  . Breast cancer ([place]) 2006   right breast ca with lumpectomy and rad tx  . Colon polyp   . Cystocele   . Depression   . Female bladder prolapse   . Goiter   . Hyperlipemia   . Procidentia of uterus   . Recurrent UTI   . Reflux   . TIA (transient ischemic attack)   . Vaginal atrophy     Past Surgical History:  Procedure Laterality Date  . APPENDECTOMY    . BREAST BIOPSY Right 2006   breast cancer  . BREAST SURGERY Right   . CYSTOCELE REPAIR N/A 12/21/2015   Procedure: ANTERIOR REPAIR (CYSTOCELE);  Surgeon: Brayton Mars, MD;  Location: [facility] ORS;  Service: Gynecology;  Laterality: N/A;  . DILATION AND CURETTAGE OF UTERUS    . VAGINAL HYSTERECTOMY Bilateral 12/21/2015   Procedure: TVH BSO;  Surgeon: Brayton Mars, MD;  Location: [facility] ORS;  Service: Gynecology;  Laterality: Bilateral;    There were no vitals filed for this visit.      Subjective Assessment - 05/24/17 0901    Subjective Patient is now having difficulty falling asleep now due to pain   Pertinent History is experiencing pain and stiffness in her R shoulder due to falling 2-3 weeks ago. The MD visit resulted in referral for physical therapy after x ray was negative for fractures.  patient states her R shoulder is feeling worse; history of R rotator cuff tendonitis that was resolved with 2 months of physical therapy earlier this year. Patient completed PT for R shoulder pain from 3/18 - 4/18 with good results and describes 0/10 pain once discharged.  Patient recalls falling on her R shoulder 2-3 weeks ago when walking her daughter's dog due to foot getting caught on cement threshold. Patient describes hitting her head but suffered no further injuries; R shoulder is now worse than before. Patient describes tingling when R shoulder is over-worked and complains of occasional aching 1/10 pain in R upper trap and neck. Patient went to MD and MD referred her to physical therapy.    Limitations Reading;Sitting   How long can you sit comfortably? Unlimited   How long can you stand comfortably? Unlimted   How long can you walk comfortably? Unlimited   Diagnostic tests X-ray: negative for fractures   Patient Stated Goals Decrease pain, to be able to raise R hand above head, and return to working in the yard   Currently in Pain? Yes   Pain Score 2    Pain Location Shoulder   Pain Orientation Right   Pain Descriptors / Indicators Aching   Pain Type Acute pain   Pain Onset 1  to 4 weeks ago   Pain Frequency Constant     Heat pack on R shoulder in sitting   manual Cervical L side bending with PT overpressure at shoulder and head to promote prolonged cervical stretch 2x60 seconds STM to subscap and distal pec major fibers with hand on forehead 6 minutes   PROM abduction and flexion 8 repetitions x 20 second holds in pain-free ROM. Patient able to get to ~148 degrees of flexion and ~130 degrees of abduction Grade I AP, PA, and inferior mobilizations 3x30 seconds each position, relieved pain especially AP  TherEx AAROM flexion with stick 10x. Required cues to "turn off" right musculature to allow R arm " along for the ride" PNF for scapular retraction and depression in sidelying.  Isometric and eccentric control of scapula; resulted in good fatigue of scapular retractors. Supine robber stretch , 2x 30 seconds  Seated posture: scapular retractions against towel 10x Pec doorway stretch 3 x 30 seconds. Patient instructed to relax pec muscles and to "open up the collar bones". Moderate stretch felt. Cues to maintain stretch by counting abc's Postural correction (anatomical position) against door 3 x 20 seconds. Good fatigue of posterior musculature and mild stretch of pec major/minor.   Next session: upper and middle trap STM requested by pt.             PT Education - 05/24/17 0906    Education provided Yes   Education Details utilizing ice after exercise when soreness/pain is increased   Person(s) Educated Patient   Methods Explanation   Comprehension Verbalized understanding             PT Long Term Goals - 05/17/17 0941      PT LONG TERM GOAL #1   Title Pt will report no pain with daily activities (i.e. reaching up to cabinet, showering, etc)   Baseline 8/10 pain when reaching overhead   Time 8   Period Weeks   Status New   Target Date 07/12/17     PT LONG TERM GOAL #2   Title Pt will improve Quick Dash score by at least 8 pts to demonstrate improved functional use of RUE   Baseline 68%    Time 8   Period Weeks   Status New   Target Date 07/12/17     PT LONG TERM GOAL #3   Title Pt will improve R shoulder strength to 5/5 throughout with MMT to demonstrate improved strength and functoinal use of RUE   Baseline Abduction = 2/5  flexion = 3/5 IR = 3+/5   Time 8   Period Weeks   Status New   Target Date 07/12/17     PT LONG TERM GOAL #4   Title Pt's R shoulder AROM will be painfree and WNL to demonstrate improved functional use of RUE   Baseline R flexion painful = 100 degrees (L = 145 degrees) R abduction painful = 5 degrees (L abduction = 150 degrees)   Time 8   Period Weeks   Status New   Target Date 07/12/17     PT LONG TERM GOAL  #5   Title --   Baseline --   Time --   Period --   Status --   Target Date --               Plan - 05/24/17 1309    Clinical Impression Statement Patient is challenged by [facility] due to difficulty controlling UE musculature. Pain elicited when anterior  musculature is contracted. Tight pectoral region musculature and subscap musculature relieved with STM and decreased discomfort of shoulder. Patient will continue to benefit from skilled physical therapy to increase ROM of R shoulder to improve functional mobility to reduce difficulty with overhead movement.    Rehab Potential Good   Clinical Impairments Affecting Rehab Potential Decreased ROM and decreased strength   PT Frequency 2x / week   PT Duration 8 weeks   PT Treatment/Interventions ADLs/Self Care Home Management;Biofeedback;Cryotherapy;Electrical Stimulation;Moist Heat;Gait training;Stair training;Functional mobility training;Therapeutic activities;Therapeutic exercise;Neuromuscular re-education;Patient/family education;Energy conservation;Manual techniques;Passive range of motion;Dry needling   PT Next Visit Plan manual therapy, scap stability, and ROM   PT Home Exercise Plan pec doorway stretch and postural correction exercise   Consulted and Agree with Plan of Care Patient      Patient will benefit from skilled therapeutic intervention in order to improve the following deficits and impairments:  Decreased range of motion, Impaired tone, Decreased activity tolerance, Pain, Hypomobility, Impaired flexibility, Improper body mechanics, Decreased mobility, Decreased strength  Visit Diagnosis: Chronic right shoulder pain  Muscle weakness (generalized)     Problem List Patient Active Problem List   Diagnosis Date Noted  . Shingles 12/29/2015  . S/P VH (vaginal hysterectomy) 12/22/2015  . Cystocele 12/21/2015  . Colon polyp 10/07/2015  . Bladder cystocele 10/07/2015  . Lower esophageal ring 10/07/2015  . H/O neoplasm  08/17/2015  . Malignant neoplasm of breast ([place]) 04/01/2014  . Acid reflux 04/01/2014  . Big thyroid 04/01/2014  . HLD (hyperlipidemia) 04/01/2014  . OP (osteoporosis) 04/01/2014  . Temporary cerebral vascular dysfunction 04/01/2014  . H/O gastrointestinal disease 02/28/2014   Janna Arch, PT, DPT   Janna Arch 05/24/2017, 1:10 PM  [place] MAIN [facility] SERVICES [address] [place], [place], [address] Phone: [phone]   Fax:  [phone]  Name: Sheryl Michael MRN: [id number] Date of Birth: [birth year]

## 2017-05-29 ENCOUNTER — Encounter: Payer: Self-pay | Admitting: Physical Therapy

## 2017-05-29 ENCOUNTER — Ambulatory Visit: Payer: Medicare Other | Admitting: Physical Therapy

## 2017-05-29 DIAGNOSIS — G8929 Other chronic pain: Secondary | ICD-10-CM

## 2017-05-29 DIAGNOSIS — M25511 Pain in right shoulder: Secondary | ICD-10-CM | POA: Diagnosis not present

## 2017-05-29 DIAGNOSIS — M6281 Muscle weakness (generalized): Secondary | ICD-10-CM

## 2017-05-29 NOTE — Therapy (Signed)
[place] MAIN [facility] SERVICES [address] [place], [place], [address] Phone: [phone]   Fax:  [phone]  Physical Therapy Treatment  Patient Details  Name: Sheryl Michael MRN: [id number] Date of Birth: September 02, 1931 Referring Provider: Corky Mull   Encounter Date: 05/29/2017      PT End of Session - 05/29/17 0857    Visit Number 4   Number of Visits 17   Date for PT Re-Evaluation 07/12/17   Authorization Type 3/10 g codes  06-19-2017 review goals`   PT Start Time 0845   PT Stop Time 0930   PT Time Calculation (min) 45 min   Activity Tolerance Patient limited by pain   Behavior During Therapy [facility] for tasks assessed/performed      Past Medical History:  Diagnosis Date  . Breast cancer ([place]) 2006   right breast ca with lumpectomy and rad tx  . Colon polyp   . Cystocele   . Depression   . Female bladder prolapse   . Goiter   . Hyperlipemia   . Procidentia of uterus   . Recurrent UTI   . Reflux   . TIA (transient ischemic attack)   . Vaginal atrophy     Past Surgical History:  Procedure Laterality Date  . APPENDECTOMY    . BREAST BIOPSY Right 2006   breast cancer  . BREAST SURGERY Right   . CYSTOCELE REPAIR N/A 12/21/2015   Procedure: ANTERIOR REPAIR (CYSTOCELE);  Surgeon: Brayton Mars, MD;  Location: [facility] ORS;  Service: Gynecology;  Laterality: N/A;  . DILATION AND CURETTAGE OF UTERUS    . VAGINAL HYSTERECTOMY Bilateral 12/21/2015   Procedure: TVH BSO;  Surgeon: Brayton Mars, MD;  Location: [facility] ORS;  Service: Gynecology;  Laterality: Bilateral;    There were no vitals filed for this visit.      Subjective Assessment - 05/29/17 0856    Subjective Patient describes more pain in her upper back and neck area   Pertinent History is experiencing pain and stiffness in her R shoulder due to falling 2-3 weeks ago. The MD visit resulted in referral for physical therapy after x ray was negative for fractures.  patient states her R shoulder is feeling worse; history of R rotator cuff tendonitis that was resolved with 2 months of physical therapy earlier this year. Patient completed PT for R shoulder pain from 3/18 - 4/18 with good results and describes 0/10 pain once discharged.  Patient recalls falling on her R shoulder 2-3 weeks ago when walking her daughter's dog due to foot getting caught on cement threshold. Patient describes hitting her head but suffered no further injuries; R shoulder is now worse than before. Patient describes tingling when R shoulder is over-worked and complains of occasional aching 1/10 pain in R upper trap and neck. Patient went to MD and MD referred her to physical therapy.    Limitations Reading;Sitting   How long can you sit comfortably? Unlimited   How long can you stand comfortably? Unlimted   How long can you walk comfortably? Unlimited   Diagnostic tests X-ray: negative for fractures   Patient Stated Goals Decrease pain, to be able to raise R hand above head, and return to working in the yard   Currently in Pain? Yes   Pain Score 3    Pain Location Shoulder   Pain Orientation Right   Pain Descriptors / Indicators Aching   Pain Type Acute pain   Pain Radiating Towards  R tricep   Pain Onset 1 to 4 weeks ago   Pain Frequency Constant   Multiple Pain Sites No         Treatment:  Hot pack to posterior R shoulder, R upper trap, and R scapular muscles in prone 8 minutes. Patient describes moderate muscle pain relief afterwards. STM to R upper trap and middle trap in prone 8 minutes. Patient describes her shoulder feeling much more mobile after STM. Grade ll inferior glides with flexion bias 2 x 45 seconds. Grade ll inferior glides with abduction bias 2 x 45 seconds.  PROM R shoulder flexion 10 x 5 seconds. Patient is able to get 155 degrees. PROM R shoulder abduction 10 x 5 seconds. Patient is able to get 115 degrees.  AAROM pulleys 10 x 10 seconds. Patient is  able to get comparable ROM to L shoulder without pain. Instruction to perform eccentric control was well understood and patient did not experience pain during exercise.                         PT Education - 05/29/17 0857    Education provided Yes   Education Details Continuing to improve ROM to improve ability to raise R hand overhead   Person(s) Educated Patient   Methods Explanation   Comprehension Verbalized understanding             PT Long Term Goals - 05/17/17 0941      PT LONG TERM GOAL #1   Title Pt will report no pain with daily activities (i.e. reaching up to cabinet, showering, etc)   Baseline 8/10 pain when reaching overhead   Time 8   Period Weeks   Status New   Target Date 07/12/17     PT LONG TERM GOAL #2   Title Pt will improve Quick Dash score by at least 8 pts to demonstrate improved functional use of RUE   Baseline 68%    Time 8   Period Weeks   Status New   Target Date 07/12/17     PT LONG TERM GOAL #3   Title Pt will improve R shoulder strength to 5/5 throughout with MMT to demonstrate improved strength and functoinal use of RUE   Baseline Abduction = 2/5  flexion = 3/5 IR = 3+/5   Time 8   Period Weeks   Status New   Target Date 07/12/17     PT LONG TERM GOAL #4   Title Pt's R shoulder AROM will be painfree and WNL to demonstrate improved functional use of RUE   Baseline R flexion painful = 100 degrees (L = 145 degrees) R abduction painful = 5 degrees (L abduction = 150 degrees)   Time 8   Period Weeks   Status New   Target Date 07/12/17     PT LONG TERM GOAL #5   Title --   Baseline --   Time --   Period --   Status --   Target Date --               Plan - 05/29/17 1216    Clinical Impression Statement Patient struggles with muscle guarding during PROM but improvement noted since last session.  STM to middle trap and R upper trap significantly decreased R shoulder stiffness and pain.  Mobilizations  resulted in mild improvement in ROM and patient described them feeling good.  Patient instructed on AAROM pulley exercise and was able to  achieve full flexion ROM.  Patient will continue to benefit from skilled physical therapy to improve ROM of R shoulder and increase stability of rotator cuff to perform ADLs without difficulty.    Rehab Potential Good   Clinical Impairments Affecting Rehab Potential Decreased ROM and decreased strength   PT Frequency 2x / week   PT Duration 8 weeks   PT Treatment/Interventions ADLs/Self Care Home Management;Biofeedback;Cryotherapy;Electrical Stimulation;Moist Heat;Gait training;Stair training;Functional mobility training;Therapeutic activities;Therapeutic exercise;Neuromuscular re-education;Patient/family education;Energy conservation;Manual techniques;Passive range of motion;Dry needling   PT Next Visit Plan manual therapy, scap stability, and ROM   PT Home Exercise Plan Pulleys, pec doorway, and postural correction   Consulted and Agree with Plan of Care Patient      Patient will benefit from skilled therapeutic intervention in order to improve the following deficits and impairments:  Decreased range of motion, Impaired tone, Decreased activity tolerance, Pain, Hypomobility, Impaired flexibility, Improper body mechanics, Decreased mobility, Decreased strength  Visit Diagnosis: Chronic right shoulder pain  Muscle weakness (generalized)     Problem List Patient Active Problem List   Diagnosis Date Noted  . Shingles 12/29/2015  . S/P VH (vaginal hysterectomy) 12/22/2015  . Cystocele 12/21/2015  . Colon polyp 10/07/2015  . Bladder cystocele 10/07/2015  . Lower esophageal ring 10/07/2015  . H/O neoplasm 08/17/2015  . Malignant neoplasm of breast ([place]) 04/01/2014  . Acid reflux 04/01/2014  . Big thyroid 04/01/2014  . HLD (hyperlipidemia) 04/01/2014  . OP (osteoporosis) 04/01/2014  . Temporary cerebral vascular dysfunction 04/01/2014  . H/O  gastrointestinal disease 02/28/2014  This entire session was performed under direct supervision and direction of a licensed Chiropractor . I have personally read, edited and approve of the note as written. [facility] [address], [place], [place] DPT 05/29/2017, 1:48 PM  [place] MAIN [facility] SERVICES [address] [place], [place], [address] Phone: [phone]   Fax:  [phone]  Name: LYVIA MONDESIR MRN: [id number] Date of Birth: Apr 14, 1931

## 2017-05-30 ENCOUNTER — Observation Stay
Admission: EM | Admit: 2017-05-30 | Discharge: 2017-05-31 | Disposition: A | Discharge status: Home / Self Care | Payer: Medicare Other | Attending: Internal Medicine | Admitting: Internal Medicine

## 2017-05-30 ENCOUNTER — Emergency Department: Payer: Medicare Other

## 2017-05-30 DIAGNOSIS — Z923 Personal history of irradiation: Secondary | ICD-10-CM | POA: Diagnosis not present

## 2017-05-30 DIAGNOSIS — R7989 Other specified abnormal findings of blood chemistry: Secondary | ICD-10-CM

## 2017-05-30 DIAGNOSIS — K219 Gastro-esophageal reflux disease without esophagitis: Secondary | ICD-10-CM | POA: Insufficient documentation

## 2017-05-30 DIAGNOSIS — Z853 Personal history of malignant neoplasm of breast: Secondary | ICD-10-CM | POA: Diagnosis not present

## 2017-05-30 DIAGNOSIS — Z79899 Other long term (current) drug therapy: Secondary | ICD-10-CM | POA: Insufficient documentation

## 2017-05-30 DIAGNOSIS — Z8673 Personal history of transient ischemic attack (TIA), and cerebral infarction without residual deficits: Secondary | ICD-10-CM | POA: Diagnosis not present

## 2017-05-30 DIAGNOSIS — I4891 Unspecified atrial fibrillation: Secondary | ICD-10-CM | POA: Diagnosis not present

## 2017-05-30 DIAGNOSIS — R002 Palpitations: Secondary | ICD-10-CM

## 2017-05-30 DIAGNOSIS — R0600 Dyspnea, unspecified: Secondary | ICD-10-CM | POA: Diagnosis not present

## 2017-05-30 DIAGNOSIS — E039 Hypothyroidism, unspecified: Secondary | ICD-10-CM | POA: Insufficient documentation

## 2017-05-30 DIAGNOSIS — M7989 Other specified soft tissue disorders: Secondary | ICD-10-CM | POA: Diagnosis not present

## 2017-05-30 DIAGNOSIS — R42 Dizziness and giddiness: Secondary | ICD-10-CM | POA: Insufficient documentation

## 2017-05-30 DIAGNOSIS — I499 Cardiac arrhythmia, unspecified: Secondary | ICD-10-CM | POA: Diagnosis present

## 2017-05-30 DIAGNOSIS — F329 Major depressive disorder, single episode, unspecified: Secondary | ICD-10-CM | POA: Insufficient documentation

## 2017-05-30 DIAGNOSIS — R000 Tachycardia, unspecified: Secondary | ICD-10-CM | POA: Diagnosis present

## 2017-05-30 LAB — TSH
TSH: 1.999 u[IU]/mL (ref 0.350–4.500)
TSH: 3.625 u[IU]/mL (ref 0.350–4.500)

## 2017-05-30 LAB — URINALYSIS, COMPLETE (UACMP) WITH MICROSCOPIC
BILIRUBIN URINE: NEGATIVE
Bacteria, UA: NONE SEEN
GLUCOSE, UA: NEGATIVE mg/dL
HGB URINE DIPSTICK: NEGATIVE
KETONES UR: NEGATIVE mg/dL
LEUKOCYTES UA: NEGATIVE
Nitrite: NEGATIVE
PROTEIN: NEGATIVE mg/dL
RBC / HPF: NONE SEEN RBC/hpf (ref 0–5)
Specific Gravity, Urine: 1.004 — ABNORMAL LOW (ref 1.005–1.030)
pH: 7 (ref 5.0–8.0)

## 2017-05-30 LAB — COMPREHENSIVE METABOLIC PANEL
ALT: 19 U/L (ref 14–54)
ANION GAP: 11 (ref 5–15)
AST: 26 U/L (ref 15–41)
Albumin: 3.8 g/dL (ref 3.5–5.0)
Alkaline Phosphatase: 68 U/L (ref 38–126)
BUN: 15 mg/dL (ref 6–20)
CALCIUM: 9.4 mg/dL (ref 8.9–10.3)
CHLORIDE: 106 mmol/L (ref 101–111)
CO2: 25 mmol/L (ref 22–32)
CREATININE: 0.91 mg/dL (ref 0.44–1.00)
GFR, EST NON AFRICAN AMERICAN: 56 mL/min — AB (ref >60)
Glucose, Bld: 79 mg/dL (ref 65–99)
Potassium: 3.6 mmol/L (ref 3.5–5.1)
Sodium: 142 mmol/L (ref 135–145)
Total Bilirubin: 0.8 mg/dL (ref 0.3–1.2)
Total Protein: 7.4 g/dL (ref 6.5–8.1)

## 2017-05-30 LAB — TROPONIN I
Troponin I: <0.03 ng/mL (ref <0.03)
Troponin I: <0.03 ng/mL (ref <0.03)

## 2017-05-30 LAB — CBC
HCT: 44.8 % (ref 35.0–47.0)
Hemoglobin: 15.1 g/dL (ref 12.0–16.0)
MCH: 32 pg (ref 26.0–34.0)
MCHC: 33.7 g/dL (ref 32.0–36.0)
MCV: 95.1 fL (ref 80.0–100.0)
PLATELETS: 219 10*3/uL (ref 150–440)
RBC: 4.71 MIL/uL (ref 3.80–5.20)
RDW: 14 % (ref 11.5–14.5)
WBC: 6.8 10*3/uL (ref 3.6–11.0)

## 2017-05-30 LAB — LIPASE, BLOOD: LIPASE: 52 U/L — AB (ref 11–51)

## 2017-05-30 LAB — FIBRIN DERIVATIVES D-DIMER (ARMC ONLY): Fibrin derivatives D-dimer (ARMC): 826.67 — ABNORMAL HIGH (ref 0.00–499.00)

## 2017-05-30 MED ORDER — DOCUSATE SODIUM 100 MG PO CAPS
100.0000 mg | ORAL_CAPSULE | Freq: Two times a day (BID) | ORAL | Status: DC
  Administered 2017-05-30 – 2017-05-31 (×2): 100 mg via ORAL
  Filled 2017-05-30 (×2): qty 1

## 2017-05-30 MED ORDER — DILTIAZEM HCL 25 MG/5ML IV SOLN
15.0000 mg | Freq: Once | INTRAVENOUS | Status: AC
  Administered 2017-05-30: 15 mg via INTRAVENOUS
  Filled 2017-05-30: qty 5

## 2017-05-30 MED ORDER — ACETAMINOPHEN 325 MG PO TABS: 650.0000 mg | ORAL_TABLET | Freq: Four times a day (QID) | ORAL | Status: DC | PRN

## 2017-05-30 MED ORDER — SODIUM CHLORIDE 0.9 % IV SOLN: 250.0000 mL | INTRAVENOUS | Status: DC | PRN

## 2017-05-30 MED ORDER — APIXABAN 2.5 MG PO TABS
2.5000 mg | ORAL_TABLET | Freq: Two times a day (BID) | ORAL | Status: DC
Start: 2017-05-30 — End: 2017-05-31
  Administered 2017-05-30 – 2017-05-31 (×2): 2.5 mg via ORAL
  Filled 2017-05-30 (×2): qty 1

## 2017-05-30 MED ORDER — SODIUM CHLORIDE 0.9% FLUSH: 3.0000 mL | INTRAVENOUS | Status: DC | PRN

## 2017-05-30 MED ORDER — SODIUM CHLORIDE 0.9 % IV BOLUS (SEPSIS)
1000.0000 mL | Freq: Once | INTRAVENOUS | Status: AC
  Administered 2017-05-30: 1000 mL via INTRAVENOUS

## 2017-05-30 MED ORDER — LEVOTHYROXINE SODIUM 50 MCG PO TABS
50.0000 ug | ORAL_TABLET | Freq: Every day | ORAL | Status: DC
  Administered 2017-05-31: 50 ug via ORAL
  Filled 2017-05-30: qty 1

## 2017-05-30 MED ORDER — ONDANSETRON HCL 4 MG PO TABS: 4.0000 mg | ORAL_TABLET | Freq: Four times a day (QID) | ORAL | Status: DC | PRN

## 2017-05-30 MED ORDER — APIXABAN 5 MG PO TABS
ORAL_TABLET | ORAL | Status: AC
  Administered 2017-05-30: 2.5 mg via ORAL
  Filled 2017-05-30: qty 1

## 2017-05-30 MED ORDER — ONDANSETRON HCL 4 MG/2ML IJ SOLN: 4.0000 mg | Freq: Four times a day (QID) | INTRAMUSCULAR | Status: DC | PRN

## 2017-05-30 MED ORDER — SODIUM CHLORIDE 0.9% FLUSH: 3.0000 mL | Freq: Two times a day (BID) | INTRAVENOUS | Status: DC

## 2017-05-30 MED ORDER — ASPIRIN 81 MG PO CHEW
81.0000 mg | CHEWABLE_TABLET | Freq: Every day | ORAL | Status: DC
Start: 2017-05-30 — End: 2017-05-31
  Administered 2017-05-31: 81 mg via ORAL
  Filled 2017-05-30: qty 1

## 2017-05-30 MED ORDER — PANTOPRAZOLE SODIUM 40 MG PO TBEC
40.0000 mg | DELAYED_RELEASE_TABLET | Freq: Every day | ORAL | Status: DC
  Administered 2017-05-31: 40 mg via ORAL
  Filled 2017-05-30: qty 1

## 2017-05-30 MED ORDER — ESCITALOPRAM OXALATE 10 MG PO TABS
10.0000 mg | ORAL_TABLET | Freq: Every day | ORAL | Status: DC
  Filled 2017-05-30: qty 1

## 2017-05-30 MED ORDER — SODIUM CHLORIDE 0.9% FLUSH
3.0000 mL | Freq: Two times a day (BID) | INTRAVENOUS | Status: DC
  Administered 2017-05-30 – 2017-05-31 (×2): 3 mL via INTRAVENOUS

## 2017-05-30 MED ORDER — DILTIAZEM HCL ER COATED BEADS 120 MG PO CP24
120.0000 mg | ORAL_CAPSULE | Freq: Every day | ORAL | Status: DC
  Administered 2017-05-31: 120 mg via ORAL
  Filled 2017-05-30: qty 1

## 2017-05-30 MED ORDER — ACETAMINOPHEN 650 MG RE SUPP: 650.0000 mg | Freq: Four times a day (QID) | RECTAL | Status: DC | PRN

## 2017-05-30 NOTE — ED Notes (Signed)
Pt up to the bathroom without assistance. Pt denies dizziness. No distress noted.

## 2017-05-30 NOTE — H&P (Signed)
[place] [facility] [place] NAME: Sheryl Michael    MR#:  [id number]  DATE OF BIRTH:  [birth year]  DATE OF ADMISSION:  05/30/2017  PRIMARY CARE PHYSICIAN: Idelle Crouch, MD   REQUESTING/REFERRING PHYSICIAN:   CHIEF COMPLAINT:   Chief Complaint  Patient presents with  . Tachycardia    HISTORY OF PRESENT ILLNESS: Sheryl Michael  is a 83 y.o. female with a known history of Hypothyroidism, breast carcinoma, bladder prolapse, hyperlipidemia, osteoporosis, reflux, stroke, who presented to the hospital with complaints of palpitations. In emergency room, she was noted to have new onset atrial fibrillation, heart rate was 160. She was given 15 mg of intravenous diltiazem, after which her heart rate slowed down to 90s and now converted into sinus rhythm. She admitted to weakness for the past one week, feeling somewhat dizzy and short of breath. She thought it was panic attacks. When the patient's granddaughter, she hasn't felt well for the past 3 weeks, she fell down 2 weeks ago injured her right shoulder for which he is undergoing physical therapy. Of the past one week. She is be more short of breath, weak and somewhat dizzy, presyncopal. She thought she had indigestion on and off today. Her cardiac enzymes remained stable.   PAST MEDICAL HISTORY:   Past Medical History:  Diagnosis Date  . Breast cancer ([place]) 2006   right breast ca with lumpectomy and rad tx  . Colon polyp   . Cystocele   . Depression   . Female bladder prolapse   . Goiter   . Hyperlipemia   . Procidentia of uterus   . Recurrent UTI   . Reflux   . TIA (transient ischemic attack)   . Vaginal atrophy     PAST SURGICAL HISTORY: Past Surgical History:  Procedure Laterality Date  . APPENDECTOMY    . BREAST BIOPSY Right 2006   breast cancer  . BREAST SURGERY Right   . CYSTOCELE REPAIR N/A 12/21/2015   Procedure: ANTERIOR REPAIR (CYSTOCELE);  Surgeon: Brayton Mars, MD;   Location: [facility] ORS;  Service: Gynecology;  Laterality: N/A;  . DILATION AND CURETTAGE OF UTERUS    . VAGINAL HYSTERECTOMY Bilateral 12/21/2015   Procedure: TVH BSO;  Surgeon: Brayton Mars, MD;  Location: [facility] ORS;  Service: Gynecology;  Laterality: Bilateral;    SOCIAL HISTORY:  Social History  Substance Use Topics  . Smoking status: Never Smoker  . Smokeless tobacco: Never Used  . Alcohol use No    FAMILY HISTORY:  Family History  Problem Relation Age of Onset  . Diabetes Sister   . Breast cancer Sister        late 50's  . Diabetes Brother     DRUG ALLERGIES:  Allergies  Allergen Reactions  . Iodinated Diagnostic Agents Anaphylaxis  . Codeine Nausea And Vomiting  . Phenobarbital Other (See Comments)  . Penicillin V Potassium Itching and Rash    Has patient had a PCN reaction causing immediate rash, facial/tongue/throat swelling, SOB or lightheadedness with hypotension: No Has patient had a PCN reaction causing severe rash involving mucus membranes or skin necrosis: No Has patient had a PCN reaction that required hospitalization: No Has patient had a PCN reaction occurring within the last 10 years: No If all of the above answers are "NO", then may proceed with Cephalosporin use.     Review of Systems  Constitutional: Positive for malaise/fatigue. Negative for chills, fever and weight loss.  HENT:  Negative for congestion.   Eyes: Negative for blurred vision and double vision.  Respiratory: Positive for shortness of breath. Negative for cough, sputum production and wheezing.   Cardiovascular: Positive for chest pain, palpitations and leg swelling. Negative for orthopnea and PND.  Gastrointestinal: Negative for abdominal pain, blood in stool, constipation, diarrhea, nausea and vomiting.  Genitourinary: Negative for dysuria, frequency, hematuria and urgency.  Musculoskeletal: Positive for joint pain. Negative for falls.  Neurological: Positive for weakness. Negative  for dizziness, tremors, focal weakness and headaches.  Endo/Heme/Allergies: Does not bruise/bleed easily.  Psychiatric/Behavioral: Negative for depression. The patient does not have insomnia.     MEDICATIONS AT HOME:  Prior to Admission medications   Medication Sig Start Date End Date Taking? Authorizing Provider  aspirin 81 MG chewable tablet Chew by mouth daily.   Yes [provider]  calcium carbonate (OSCAL) 1500 (600 CA) MG TABS tablet Take by mouth 2 (two) times daily with a meal.   Yes [provider]  escitalopram (LEXAPRO) 10 MG tablet Take 10 mg by mouth.   Yes [provider]  levothyroxine (SYNTHROID, LEVOTHROID) 50 MCG tablet Take 50 mcg by mouth daily before breakfast.   Yes [provider]  omeprazole (PRILOSEC) 20 MG capsule Take 20 mg by mouth daily.   Yes [provider]      PHYSICAL EXAMINATION:   VITAL SIGNS: Blood pressure 103/62, pulse 84, temperature 97.8 F (36.6 C), temperature source Oral, resp. rate 13, height 5\' 1"  (1.549 m), weight 56.7 kg (125 lb), SpO2 100 %.  GENERAL:  83 y.o.-year-old patient lying in the bed with no acute distress.  EYES: Pupils equal, round, reactive to light and accommodation. No scleral icterus. Extraocular muscles intact.  HEENT: Head atraumatic, normocephalic. Oropharynx and nasopharynx clear.  NECK:  Supple, no jugular venous distention. No thyroid enlargement, no tenderness.  LUNGS: Normal breath sounds bilaterally, no wheezing, rales,rhonchi or crepitation. No use of accessory muscles of respiration.  CARDIOVASCULAR: S1, S2 , Irregularly irregular. No murmurs, rubs, or gallops.  ABDOMEN: Soft, nontender, nondistended. Bowel sounds present. No organomegaly or mass.  EXTREMITIES: No pedal edema, cyanosis, or clubbing.  NEUROLOGIC: Cranial nerves II through XII are intact. Muscle strength 5/5 in all extremities. Sensation intact. Gait not checked.  PSYCHIATRIC: The patient is alert and  oriented x 3.  SKIN: No obvious rash, lesion, or ulcer.   LABORATORY PANEL:   CBC  Recent Labs Lab 05/30/17 1532  WBC 6.8  HGB 15.1  HCT 44.8  PLT 219  MCV 95.1  MCH 32.0  MCHC 33.7  RDW 14.0   ------------------------------------------------------------------------------------------------------------------  Chemistries   Recent Labs Lab 05/30/17 1532  NA 142  K 3.6  CL 106  CO2 25  GLUCOSE 79  BUN 15  CREATININE 0.91  CALCIUM 9.4  AST 26  ALT 19  ALKPHOS 68  BILITOT 0.8   ------------------------------------------------------------------------------------------------------------------  Cardiac Enzymes  Recent Labs Lab 05/30/17 1532  TROPONINI <0.03   ------------------------------------------------------------------------------------------------------------------  RADIOLOGY: Dg Chest Portable 1 View  Result Date: 05/30/2017 CLINICAL DATA:  Tachycardia EXAM: PORTABLE CHEST 1 VIEW COMPARISON:  04/14/2013 FINDINGS: The heart size and mediastinal contours are within normal limits. Both lungs are clear. The visualized skeletal structures are unremarkable. IMPRESSION: No active disease. Electronically Signed   By: Inez Catalina M.D.   On: 05/30/2017 15:51    EKG: Orders placed or performed during the hospital encounter of 05/30/17  . EKG 12-Lead  . EKG 12-Lead  . EKG 12-Lead  .  EKG 12-Lead   EKG in the emergency room revealed A. fib at rate of 157 bpm, right bundle branch block, nonspecific ST-T changes. Repeated EKG revealed A. fib at rate of 99 bpm, premature ventricular complexes, prolonged QTC to 555 ms, no acute ST-T changes   IMPRESSION AND PLAN:  Active Problems:   Atrial fibrillation with RVR (HCC)   Dyspnea  #1. A. fib, RVR, converted to sinus rhythm on Cardizem, continue Cardizem CD, follow cardiac enzymes 3, get TSH, cardiology consultation, get echocardiogram, initiate patient on Eliquis #2. Dyspnea of unclear etiology, could be related  to her facial fibrillation, RVR, however, cannot rule out pulmonary embolism in view of prior history of breast carcinoma, get d-dimer, CT angiogram of the chest if needed, chest x-ray revealed no active disease #3. Lower extremity swelling, get Doppler ultrasound to rule out DVT #4. History of hypothyroidism, check TSH, continue Synthroid  All the records are reviewed and case discussed with ED provider. Management plans discussed with the patient, family and they are in agreement.  CODE STATUS: Code Status History    Date Active Date Inactive Code Status Order ID Comments User Context   12/21/2015 11:32 AM 12/22/2015  6:07 PM Full Code [id number]  Defrancesco, Alanda Slim, MD Inpatient       TOTAL TIME TAKING CARE OF THIS PATIENT: 55 minutes.    Theodoro Grist M.D on 05/30/2017 at 6:50 PM  Between 7am to 6pm - Pager - [phone] After 6pm go to [email or web address] - password [email or web address] [facility]  Office  [phone]  CC: Primary care physician; Idelle Crouch, MD

## 2017-05-30 NOTE — ED Notes (Signed)
MD at bedside with pt and family. Pt is alert and oriented x 4.

## 2017-05-30 NOTE — ED Triage Notes (Signed)
Pt here from [facility] for rapid HR, reports she thought she was having an anxiety attack, went to PCP for check. Pt with no history of afib.

## 2017-05-30 NOTE — ED Provider Notes (Signed)
[facility] Emergency Department Provider Note   ____________________________________________   I have reviewed the triage vital signs and the nursing notes.   HISTORY  Chief Complaint Tachycardia   History limited by: Not Limited   HPI Sheryl Michael is a 83 y.o. female who presents to the emergency department today because of concerns for palpitations and feeling flushed. She states the symptoms been going on for 2 days. She says she did have a similar episode not too long ago however was much shorter lived. Today it has been constant. She has not had any chest pain with this. She has had shortness of breath. States that a number of years ago she was having issues with abnormal heart rhythms but it resolved on her own. She states that she has recently come off her Lexapro but denies any other medication changes. She denies any recent illness.   Past Medical History:  Diagnosis Date  . Breast cancer ([place]) 2006   right breast ca with lumpectomy and rad tx  . Colon polyp   . Cystocele   . Depression   . Female bladder prolapse   . Goiter   . Hyperlipemia   . Procidentia of uterus   . Recurrent UTI   . Reflux   . TIA (transient ischemic attack)   . Vaginal atrophy     Patient Active Problem List   Diagnosis Date Noted  . Shingles 12/29/2015  . S/P VH (vaginal hysterectomy) 12/22/2015  . Cystocele 12/21/2015  . Colon polyp 10/07/2015  . Bladder cystocele 10/07/2015  . Lower esophageal ring 10/07/2015  . H/O neoplasm 08/17/2015  . Malignant neoplasm of breast ([place]) 04/01/2014  . Acid reflux 04/01/2014  . Big thyroid 04/01/2014  . HLD (hyperlipidemia) 04/01/2014  . OP (osteoporosis) 04/01/2014  . Temporary cerebral vascular dysfunction 04/01/2014  . H/O gastrointestinal disease 02/28/2014    Past Surgical History:  Procedure Laterality Date  . APPENDECTOMY    . BREAST BIOPSY Right 2006   breast cancer  . BREAST SURGERY Right   . CYSTOCELE  REPAIR N/A 12/21/2015   Procedure: ANTERIOR REPAIR (CYSTOCELE);  Surgeon: Brayton Mars, MD;  Location: [facility] ORS;  Service: Gynecology;  Laterality: N/A;  . DILATION AND CURETTAGE OF UTERUS    . VAGINAL HYSTERECTOMY Bilateral 12/21/2015   Procedure: TVH BSO;  Surgeon: Brayton Mars, MD;  Location: [facility] ORS;  Service: Gynecology;  Laterality: Bilateral;    Prior to Admission medications   Medication Sig Start Date End Date Taking? Authorizing Provider  calcium carbonate (OSCAL) 1500 (600 CA) MG TABS tablet Take by mouth 2 (two) times daily with a meal.    [provider]  escitalopram (LEXAPRO) 10 MG tablet Take 10 mg by mouth.    [provider]  levothyroxine (SYNTHROID, LEVOTHROID) 50 MCG tablet Take 50 mcg by mouth daily before breakfast.    [provider]  omeprazole (PRILOSEC) 20 MG capsule Take 20 mg by mouth daily.    [provider]    Allergies Iodinated diagnostic agents; Codeine; Phenobarbital; and Penicillin v potassium  Family History  Problem Relation Age of Onset  . Diabetes Sister   . Breast cancer Sister        late 27's  . Diabetes Brother     Social History Social History  Substance Use Topics  . Smoking status: Never Smoker  . Smokeless tobacco: Never Used  . Alcohol use No    Review of Systems Constitutional: No fever/chills Eyes:  No visual changes. ENT: No sore throat. Cardiovascular: Denies chest pain. Positive for palpitations Respiratory: Positive for shortness of breath. Gastrointestinal: No abdominal pain.  No nausea, no vomiting.  No diarrhea.   Genitourinary: Negative for dysuria. Musculoskeletal: Negative for back pain. Skin: Negative for rash. Neurological: Negative for headaches, focal weakness or numbness.  ____________________________________________   PHYSICAL EXAM:  VITAL SIGNS: ED Triage Vitals  Enc Vitals Group     BP 05/30/17 1531 124/76     Pulse Rate 05/30/17 1531 (!) 160      Resp 05/30/17 1531 16     Temp 05/30/17 1531 97.8 F (36.6 C)     Temp Source 05/30/17 1531 Oral     SpO2 05/30/17 1531 98 %     Weight 05/30/17 1532 125 lb (56.7 kg)     Height 05/30/17 1532 5\' 1"  (1.549 m)     Head Circumference --      Peak Flow --     Constitutional: Alert and oriented. Well appearing and in no distress. Eyes: Conjunctivae are normal.  ENT   Head: Normocephalic and atraumatic.   Nose: No congestion/rhinnorhea.   Mouth/Throat: Mucous membranes are moist.   Neck: No stridor. Hematological/Lymphatic/Immunilogical: No cervical lymphadenopathy. Cardiovascular: Tachycardic, irregularly irregular rhythm.  No murmurs, rubs, or gallops.  Respiratory: Normal respiratory effort without tachypnea nor retractions. Breath sounds are clear and equal bilaterally. No wheezes/rales/rhonchi. Gastrointestinal: Soft and non tender. No rebound. No guarding.  Genitourinary: Deferred Musculoskeletal: Normal range of motion in all extremities. No lower extremity edema. Neurologic:  Normal speech and language. No gross focal neurologic deficits are appreciated.  Skin:  Skin is warm, dry and intact. No rash noted. Psychiatric: Mood and affect are normal. Speech and behavior are normal. Patient exhibits appropriate insight and judgment.  ____________________________________________    LABS (pertinent positives/negatives)  Labs Reviewed  LIPASE, BLOOD - Abnormal; Notable for the following:       Result Value   Lipase 52 (*)    All other components within normal limits  COMPREHENSIVE METABOLIC PANEL - Abnormal; Notable for the following:    GFR calc non Af Amer 56 (*)    All other components within normal limits  URINALYSIS, COMPLETE (UACMP) WITH MICROSCOPIC - Abnormal; Notable for the following:    Color, Urine STRAW (*)    APPearance CLEAR (*)    Specific Gravity, Urine 1.004 (*)    Squamous Epithelial / LPF 0-5 (*)    All other components within normal limits   CBC  TROPONIN I  TSH     ____________________________________________   EKG  I, Nance Pear, attending physician, personally viewed and interpreted this EKG  EKG Time: 1524 Rate: 157 Rhythm: atrial fibrillation with RVR Axis: normal Intervals: qtc 452 QRS: RBBB ST changes: no st elevation Impression: abnormal ekg  I, Nance Pear, attending physician, personally viewed and interpreted this EKG  EKG Time: 1605 Rate: 99 Rhythm: atrial fibrillation Axis: normal Intervals: qtc 555 QRS: narrow ST changes: no st elevation Impression: abnormal ekg   ____________________________________________    RADIOLOGY  CXR IMPRESSION: No active disease.   ____________________________________________   PROCEDURES  Procedures  CRITICAL CARE Performed by: Nance Pear   Total critical care time: 30 minutes  Critical care time was exclusive of separately billable procedures and treating other patients.  Critical care was necessary to treat or prevent imminent or life-threatening deterioration.  Critical care was time spent personally by me on the following activities: development of treatment plan with patient  and/or surrogate as well as nursing, discussions with consultants, evaluation of patient's response to treatment, examination of patient, obtaining history from patient or surrogate, ordering and performing treatments and interventions, ordering and review of laboratory studies, ordering and review of radiographic studies, pulse oximetry and re-evaluation of patient's condition.  ____________________________________________   INITIAL IMPRESSION / ASSESSMENT AND PLAN / ED COURSE  Pertinent labs & imaging results that were available during my care of the patient were reviewed by me and considered in my medical decision making (see chart for details).  Patient presented to the emergency department today because of concerns for palpitations. Initial  heart rate was in the 150s and 160s. This was atrial fibrillation with RVR. Patient without any history of atrial fibrillation. Patient's heart rate did improve after IV diltiazem. Initial blood work without concerning findings. Given new onset A. fib will plan on admission to hospital service for further workup and evaluation.  ____________________________________________   FINAL CLINICAL IMPRESSION(S) / ED DIAGNOSES  Final diagnoses:  Palpitations  Atrial fibrillation with RVR ([place])     Note: This dictation was prepared with Dragon dictation. Any transcriptional errors that result from this process are unintentional     Nance Pear, MD 05/30/17 [phone]

## 2017-05-30 NOTE — ED Notes (Signed)
Admitting MD at bedside with pt and pts family.  

## 2017-05-30 NOTE — ED Notes (Signed)
Pts family out getting dinner at this time. Pt requested waiting for food to return in order to take pill with food.

## 2017-05-31 ENCOUNTER — Inpatient Hospital Stay
Admit: 2017-05-31 | Discharge: 2017-05-31 | Disposition: A | Discharge status: Home / Self Care | Payer: Medicare Other | Attending: Internal Medicine | Admitting: Internal Medicine

## 2017-05-31 ENCOUNTER — Inpatient Hospital Stay: Payer: Medicare Other

## 2017-05-31 ENCOUNTER — Ambulatory Visit: Payer: Medicare Other | Admitting: Physical Therapy

## 2017-05-31 DIAGNOSIS — I499 Cardiac arrhythmia, unspecified: Secondary | ICD-10-CM | POA: Diagnosis present

## 2017-05-31 LAB — BASIC METABOLIC PANEL
Anion gap: 8 (ref 5–15)
BUN: 14 mg/dL (ref 6–20)
CALCIUM: 8.9 mg/dL (ref 8.9–10.3)
CO2: 26 mmol/L (ref 22–32)
CREATININE: 0.86 mg/dL (ref 0.44–1.00)
Chloride: 107 mmol/L (ref 101–111)
Glucose, Bld: 89 mg/dL (ref 65–99)
Potassium: 4.1 mmol/L (ref 3.5–5.1)
SODIUM: 141 mmol/L (ref 135–145)

## 2017-05-31 LAB — CBC
HCT: 40.7 % (ref 35.0–47.0)
Hemoglobin: 13.6 g/dL (ref 12.0–16.0)
MCH: 32 pg (ref 26.0–34.0)
MCHC: 33.4 g/dL (ref 32.0–36.0)
MCV: 95.9 fL (ref 80.0–100.0)
PLATELETS: 192 10*3/uL (ref 150–440)
RBC: 4.24 MIL/uL (ref 3.80–5.20)
RDW: 14.2 % (ref 11.5–14.5)
WBC: 5.5 10*3/uL (ref 3.6–11.0)

## 2017-05-31 LAB — TROPONIN I: Troponin I: <0.03 ng/mL (ref <0.03)

## 2017-05-31 LAB — ECHOCARDIOGRAM COMPLETE
HEIGHTINCHES: 60 in
Weight: 1992.96 oz

## 2017-05-31 LAB — GLUCOSE, CAPILLARY: GLUCOSE-CAPILLARY: 190 mg/dL — AB (ref 65–99)

## 2017-05-31 MED ORDER — DILTIAZEM HCL ER COATED BEADS 120 MG PO CP24: 120.0000 mg | ORAL_CAPSULE | Freq: Every day | ORAL | 0 refills | Status: DC

## 2017-05-31 MED ORDER — APIXABAN 2.5 MG PO TABS: 2.5000 mg | ORAL_TABLET | Freq: Two times a day (BID) | ORAL | 0 refills | Status: DC

## 2017-05-31 NOTE — Consult Note (Signed)
Reason for Consult: Atrial fibrillation tachycardia Referring Physician: Georgie Chard primary, Dr Desma Maxim Sheryl Michael is an 83 y.o. female.  HPI: 82 year old white female history of hypothyroidism previous breast cancer hyperlipidemia osteoporosis reportedly had an episode of palpitations tachycardia over the last several months. Symptoms have been waxing and waning she's had episodes of syncope lightheadedness vertigo and what she thought were panic attacks. The day of admission she started having racing of her heart and what she felt was probably contacts patient and her vitals taken and realized that her heart rate was fast and a blood pressure hemolysis she was brought to the emergency room and found to be in rapid atrial fibrillation. Denied any chest pain had some shortness of breath denies any previous history of atrial fibrillation does have a history of CVA 3 years ago. The patient subsequently converted to sinus rhythm after Cardizem was given. Patient was placed on Eliquis 2.5 mg twice a day and his coagulation  Past Medical History:  Diagnosis Date  . Breast cancer ([place]) 2006   right breast ca with lumpectomy and rad tx  . Colon polyp   . Cystocele   . Depression   . Female bladder prolapse   . Goiter   . Hyperlipemia   . Procidentia of uterus   . Recurrent UTI   . Reflux   . TIA (transient ischemic attack)   . Vaginal atrophy     Past Surgical History:  Procedure Laterality Date  . APPENDECTOMY    . BREAST BIOPSY Right 2006   breast cancer  . BREAST SURGERY Right   . CYSTOCELE REPAIR N/A 12/21/2015   Procedure: ANTERIOR REPAIR (CYSTOCELE);  Surgeon: Brayton Mars, MD;  Location: [facility] ORS;  Service: Gynecology;  Laterality: N/A;  . DILATION AND CURETTAGE OF UTERUS    . VAGINAL HYSTERECTOMY Bilateral 12/21/2015   Procedure: TVH BSO;  Surgeon: Brayton Mars, MD;  Location: [facility] ORS;  Service: Gynecology;  Laterality: Bilateral;    Family History  Problem  Relation Age of Onset  . Diabetes Sister   . Breast cancer Sister        late 58's  . Diabetes Brother     Social History:  reports that she has never smoked. She has never used smokeless tobacco. She reports that she does not drink alcohol or use drugs.  Allergies:  Allergies  Allergen Reactions  . Iodinated Diagnostic Agents Anaphylaxis  . Codeine Nausea And Vomiting  . Phenobarbital Other (See Comments)  . Penicillin V Potassium Itching and Rash    Has patient had a PCN reaction causing immediate rash, facial/tongue/throat swelling, SOB or lightheadedness with hypotension: No Has patient had a PCN reaction causing severe rash involving mucus membranes or skin necrosis: No Has patient had a PCN reaction that required hospitalization: No Has patient had a PCN reaction occurring within the last 10 years: No If all of the above answers are "NO", then may proceed with Cephalosporin use.     Medications: I have reviewed the patient's current medications.  Results for orders placed or performed during the hospital encounter of 05/30/17 (from the past 48 hour(s))  Lipase, blood     Status: Abnormal   Collection Time: 05/30/17  3:32 PM  Result Value Ref Range   Lipase 52 (H) 11 - 51 U/L  Comprehensive metabolic panel     Status: Abnormal   Collection Time: 05/30/17  3:32 PM  Result Value Ref Range   Sodium 142 135 -  145 mmol/L   Potassium 3.6 3.5 - 5.1 mmol/L   Chloride 106 101 - 111 mmol/L   CO2 25 22 - 32 mmol/L   Glucose, Bld 79 65 - 99 mg/dL   BUN 15 6 - 20 mg/dL   Creatinine, Ser 0.91 0.44 - 1.00 mg/dL   Calcium 9.4 8.9 - 10.3 mg/dL   Total Protein 7.4 6.5 - 8.1 g/dL   Albumin 3.8 3.5 - 5.0 g/dL   AST 26 15 - 41 U/L   ALT 19 14 - 54 U/L   Alkaline Phosphatase 68 38 - 126 U/L   Total Bilirubin 0.8 0.3 - 1.2 mg/dL   GFR calc non Af Amer 56 (L) >60 mL/min   GFR calc Af Amer >60 >60 mL/min    Comment: (NOTE) The eGFR has been calculated using the CKD EPI equation. This  calculation has not been validated in all clinical situations. eGFR's persistently <60 mL/min signify possible Chronic Kidney Disease.    Anion gap 11 5 - 15  CBC     Status: None   Collection Time: 05/30/17  3:32 PM  Result Value Ref Range   WBC 6.8 3.6 - 11.0 K/uL   RBC 4.71 3.80 - 5.20 MIL/uL   Hemoglobin 15.1 12.0 - 16.0 g/dL   HCT 44.8 35.0 - 47.0 %   MCV 95.1 80.0 - 100.0 fL   MCH 32.0 26.0 - 34.0 pg   MCHC 33.7 32.0 - 36.0 g/dL   RDW 14.0 11.5 - 14.5 %   Platelets 219 150 - 440 K/uL  Urinalysis, Complete w Microscopic     Status: Abnormal   Collection Time: 05/30/17  3:32 PM  Result Value Ref Range   Color, Urine STRAW (A) YELLOW   APPearance CLEAR (A) CLEAR   Specific Gravity, Urine 1.004 (L) 1.005 - 1.030   pH 7.0 5.0 - 8.0   Glucose, UA NEGATIVE NEGATIVE mg/dL   Hgb urine dipstick NEGATIVE NEGATIVE   Bilirubin Urine NEGATIVE NEGATIVE   Ketones, ur NEGATIVE NEGATIVE mg/dL   Protein, ur NEGATIVE NEGATIVE mg/dL   Nitrite NEGATIVE NEGATIVE   Leukocytes, UA NEGATIVE NEGATIVE   RBC / HPF NONE SEEN 0 - 5 RBC/hpf   WBC, UA 0-5 0 - 5 WBC/hpf   Bacteria, UA NONE SEEN NONE SEEN   Squamous Epithelial / LPF 0-5 (A) NONE SEEN  Troponin I     Status: None   Collection Time: 05/30/17  3:32 PM  Result Value Ref Range   Troponin I <0.03 <0.03 ng/mL  TSH     Status: None   Collection Time: 05/30/17  3:32 PM  Result Value Ref Range   TSH 1.999 0.350 - 4.500 uIU/mL    Comment: Performed by a 3rd Generation assay with a functional sensitivity of <=0.01 uIU/mL.  Fibrin derivatives D-Dimer ([facility] only)     Status: Abnormal   Collection Time: 05/30/17  3:32 PM  Result Value Ref Range   Fibrin derivatives D-dimer ([facility]) 826.67 (H) 0.00 - 499.00    Comment: (NOTE) <> Exclusion of Venous Thromboembolism (VTE) - OUTPATIENT ONLY   (Emergency Department or [place])   0-499 ng/ml (FEU): With a low to intermediate pretest probability                      for VTE this test result excludes  the diagnosis                      of  VTE.   >499 ng/ml (FEU) : VTE not excluded; additional work up for VTE is                      required. <> Testing on Inpatients and Evaluation of Disseminated Intravascular   Coagulation ([facility]) Reference Range:   0-499 ng/ml (FEU)   TSH     Status: None   Collection Time: 05/30/17  8:48 PM  Result Value Ref Range   TSH 3.625 0.350 - 4.500 uIU/mL    Comment: Performed by a 3rd Generation assay with a functional sensitivity of <=0.01 uIU/mL.  Troponin I     Status: None   Collection Time: 05/30/17  8:48 PM  Result Value Ref Range   Troponin I <0.03 <0.03 ng/mL  Troponin I     Status: None   Collection Time: 05/31/17  2:29 AM  Result Value Ref Range   Troponin I <0.03 <0.03 ng/mL  Troponin I     Status: None   Collection Time: 05/31/17  8:36 AM  Result Value Ref Range   Troponin I <0.03 <0.03 ng/mL  Basic metabolic panel     Status: None   Collection Time: 05/31/17  8:36 AM  Result Value Ref Range   Sodium 141 135 - 145 mmol/L   Potassium 4.1 3.5 - 5.1 mmol/L   Chloride 107 101 - 111 mmol/L   CO2 26 22 - 32 mmol/L   Glucose, Bld 89 65 - 99 mg/dL   BUN 14 6 - 20 mg/dL   Creatinine, Ser 0.86 0.44 - 1.00 mg/dL   Calcium 8.9 8.9 - 10.3 mg/dL   GFR calc non Af Amer >60 >60 mL/min   GFR calc Af Amer >60 >60 mL/min    Comment: (NOTE) The eGFR has been calculated using the CKD EPI equation. This calculation has not been validated in all clinical situations. eGFR's persistently <60 mL/min signify possible Chronic Kidney Disease.    Anion gap 8 5 - 15  CBC     Status: None   Collection Time: 05/31/17  8:36 AM  Result Value Ref Range   WBC 5.5 3.6 - 11.0 K/uL   RBC 4.24 3.80 - 5.20 MIL/uL   Hemoglobin 13.6 12.0 - 16.0 g/dL   HCT 40.7 35.0 - 47.0 %   MCV 95.9 80.0 - 100.0 fL   MCH 32.0 26.0 - 34.0 pg   MCHC 33.4 32.0 - 36.0 g/dL   RDW 14.2 11.5 - 14.5 %   Platelets 192 150 - 440 K/uL  Glucose, capillary     Status: Abnormal   Collection  Time: 05/31/17  9:09 AM  Result Value Ref Range   Glucose-Capillary 190 (H) 65 - 99 mg/dL    Dg Chest Portable 1 View  Result Date: 05/30/2017 CLINICAL DATA:  Tachycardia EXAM: PORTABLE CHEST 1 VIEW COMPARISON:  04/14/2013 FINDINGS: The heart size and mediastinal contours are within normal limits. Both lungs are clear. The visualized skeletal structures are unremarkable. IMPRESSION: No active disease. Electronically Signed   By: Inez Catalina M.D.   On: 05/30/2017 15:51    Review of Systems  Constitutional: Positive for malaise/fatigue.  HENT: Positive for congestion.   Eyes: Negative.   Respiratory: Positive for shortness of breath.   Cardiovascular: Positive for palpitations.  Gastrointestinal: Negative.   Genitourinary: Negative.   Musculoskeletal: Positive for myalgias.  Skin: Negative.   Neurological: Positive for dizziness, loss of consciousness and weakness.  Endo/Heme/Allergies: Negative.   Psychiatric/Behavioral: Negative.  Blood pressure (!) 146/50, pulse 63, temperature 97.9 F (36.6 C), temperature source Oral, resp. rate 17, height 5' (1.524 m), weight 56.5 kg (124 lb 9 oz), SpO2 100 %. Physical Exam  Nursing note and vitals reviewed. Constitutional: She is oriented to person, place, and time. She appears well-developed and well-nourished.  HENT:  Head: Normocephalic and atraumatic.  Eyes: Pupils are equal, round, and reactive to light. Conjunctivae and EOM are normal.  Neck: Normal range of motion. Neck supple.  Cardiovascular: Normal rate and regular rhythm.   Murmur heard. Respiratory: Effort normal and breath sounds normal.  GI: Soft. Bowel sounds are normal.  Musculoskeletal: Normal range of motion.  Neurological: She is alert and oriented to person, place, and time. She has normal reflexes.  Skin: Skin is warm and dry.  Psychiatric: She has a normal mood and affect.    Assessment/Plan: Atrial fibrillation Lightheaded Vertigo History of CVA Shortness  of breath History of breast cancer Thyroid disease . PLAN Agree with admission to telemetry Rule out for myocardial infarction with enzymes and EKG Agree with Cardizem for rate control Eliquis for anticoagulation  Low-dose aspirin 81 mg a day in conjunction with Eliquis for anticoagulation  Agree with echocardiogram for assessment of LV function and valvular structures Recommend functional study probably Myoview can be done as an outpatient Follow-up with cardiology one to 2 weeks  Garland Hincapie D Scarleth Brame 05/31/2017, 11:56 AM

## 2017-05-31 NOTE — Care Management (Signed)
Provided patient with Eliquis 30 day trial coupon. Contacted [employer] for her pharmacy benefit for this medication.  Patient does not think she has met her out of pocket deductible for medications. Informed copay will be will be 25% of the cost of the medication which could be between 50 - 70 dollars

## 2017-05-31 NOTE — Care Management CC44 (Signed)
Condition Code 44 Documentation Completed  Patient Details  Name: Sheryl Michael MRN: [id number] Date of Birth: 1930/12/18   Condition Code 44 given:  Yes Patient signature on Condition Code 44 notice:  Yes Documentation of 2 MD's agreement:  Yes Code 44 added to claim:  Yes    Katrina Stack, RN 05/31/2017, 1:51 PM

## 2017-05-31 NOTE — Discharge Instructions (Signed)
Heart healthy diet. °Fall precaution. °

## 2017-05-31 NOTE — Care Management Obs Status (Signed)
[place] NOTIFICATION   Patient Details  Name: Sheryl Michael MRN: [id number] Date of Birth: 04-Sep-1931   Medicare Observation Status Notification Given:  Yes Notice signed, one given to patient and the other to HIM for scanning   Katrina Stack, RN 05/31/2017, 1:51 PM

## 2017-05-31 NOTE — Care Management (Addendum)
Informed patient is to discharge home on new Eliquis due to new onset atrial fib.  Left message for primary nurse to notify CM when patient arrives back on unit as CM must speak with patient before she discharges

## 2017-05-31 NOTE — Discharge Summary (Signed)
[facility] NAME: Joletta Manner    MR#:  [id number]  DATE OF BIRTH:  Mar 15, 1931  DATE OF ADMISSION:  05/30/2017   ADMITTING PHYSICIAN: Theodoro Grist, MD  DATE OF DISCHARGE: 05/31/2017  1:53 PM  PRIMARY CARE PHYSICIAN: Idelle Crouch, MD   ADMISSION DIAGNOSIS:  Palpitations [R00.2] Atrial fibrillation with RVR ([place]) [I48.91] DISCHARGE DIAGNOSIS:  Active Problems:   Atrial fibrillation with RVR ([place])   Dyspnea   Arrhythmia  SECONDARY DIAGNOSIS:   Past Medical History:  Diagnosis Date  . Breast cancer ([place]) 2006   right breast ca with lumpectomy and rad tx  . Colon polyp   . Cystocele   . Depression   . Female bladder prolapse   . Goiter   . Hyperlipemia   . Procidentia of uterus   . Recurrent UTI   . Reflux   . TIA (transient ischemic attack)   . Vaginal atrophy    HOSPITAL COURSE:   #1. A. fib, RVR, converted to sinus rhythm on Cardizem, started Eliquis. continue Cardizem CD and Eliquis and ASA, follow-up as outpatient in 1 week per Dr. Clayborn Bigness.  Echocardiogram is unremarkable.  #2. Dyspnea, could be related to her facial fibrillation, RVR, improved. Mild elevated d-dimer, the patient is allergy to contrast, unable to get CT angiogram of the chest. Venous ultrasound of lower extremity doesn't show any DVT.  #3. Lower extremity swelling, get Doppler ultrasound: no DVT #4. History of hypothyroidism, continue Synthroid  DISCHARGE CONDITIONS:  Stable, discharged to home today. CONSULTS OBTAINED:  Treatment Team:  Yolonda Kida, MD DRUG ALLERGIES:   Allergies  Allergen Reactions  . Iodinated Diagnostic Agents Anaphylaxis  . Codeine Nausea And Vomiting  . Phenobarbital Other (See Comments)  . Penicillin V Potassium Itching and Rash    Has patient had a PCN reaction causing immediate rash, facial/tongue/throat swelling, SOB or lightheadedness with hypotension: No Has patient had a PCN reaction  causing severe rash involving mucus membranes or skin necrosis: No Has patient had a PCN reaction that required hospitalization: No Has patient had a PCN reaction occurring within the last 10 years: No If all of the above answers are "NO", then may proceed with Cephalosporin use.    DISCHARGE MEDICATIONS:   Allergies as of 05/31/2017      Reactions   Iodinated Diagnostic Agents Anaphylaxis   Codeine Nausea And Vomiting   Phenobarbital Other (See Comments)   Penicillin V Potassium Itching, Rash   Has patient had a PCN reaction causing immediate rash, facial/tongue/throat swelling, SOB or lightheadedness with hypotension: No Has patient had a PCN reaction causing severe rash involving mucus membranes or skin necrosis: No Has patient had a PCN reaction that required hospitalization: No Has patient had a PCN reaction occurring within the last 10 years: No If all of the above answers are "NO", then may proceed with Cephalosporin use.      Medication List    TAKE these medications   apixaban 2.5 MG Tabs tablet Commonly known as:  ELIQUIS Take 1 tablet (2.5 mg total) by mouth 2 (two) times daily.   aspirin 81 MG chewable tablet Chew by mouth daily.   calcium carbonate 1500 (600 Ca) MG Tabs tablet Commonly known as:  OSCAL Take by mouth 2 (two) times daily with a meal.   diltiazem 120 MG 24 hr capsule Commonly known as:  CARDIZEM CD Take 1 capsule (120 mg total) by mouth daily.   escitalopram  10 MG tablet Commonly known as:  LEXAPRO Take 10 mg by mouth.   levothyroxine 50 MCG tablet Commonly known as:  SYNTHROID, LEVOTHROID Take 50 mcg by mouth daily before breakfast.   omeprazole 20 MG capsule Commonly known as:  PRILOSEC Take 20 mg by mouth daily.        DISCHARGE INSTRUCTIONS:  See AVS.  If you experience worsening of your admission symptoms, develop shortness of breath, life threatening emergency, suicidal or homicidal thoughts you must seek medical attention  immediately by calling 911 or calling your MD immediately  if symptoms less severe.  You Must read complete instructions/literature along with all the possible adverse reactions/side effects for all the Medicines you take and that have been prescribed to you. Take any new Medicines after you have completely understood and accpet all the possible adverse reactions/side effects.   Please note  You were cared for by a hospitalist during your hospital stay. If you have any questions about your discharge medications or the care you received while you were in the hospital after you are discharged, you can call the unit and asked to speak with the hospitalist on call if the hospitalist that took care of you is not available. Once you are discharged, your primary care physician will handle any further medical issues. Please note that NO REFILLS for any discharge medications will be authorized once you are discharged, as it is imperative that you return to your primary care physician (or establish a relationship with a primary care physician if you do not have one) for your aftercare needs so that they can reassess your need for medications and monitor your lab values.    On the day of Discharge:  VITAL SIGNS:  Blood pressure (!) 146/50, pulse 63, temperature 97.9 F (36.6 C), temperature source Oral, resp. rate 17, height 5' (1.524 m), weight 124 lb 9 oz (56.5 kg), SpO2 100 %. PHYSICAL EXAMINATION:  GENERAL:  83 y.o.-year-old patient lying in the bed with no acute distress.  EYES: Pupils equal, round, reactive to light and accommodation. No scleral icterus. Extraocular muscles intact.  HEENT: Head atraumatic, normocephalic. Oropharynx and nasopharynx clear.  NECK:  Supple, no jugular venous distention. No thyroid enlargement, no tenderness.  LUNGS: Normal breath sounds bilaterally, no wheezing, rales,rhonchi or crepitation. No use of accessory muscles of respiration.  CARDIOVASCULAR: S1, S2 normal. No  murmurs, rubs, or gallops.  ABDOMEN: Soft, non-tender, non-distended. Bowel sounds present. No organomegaly or mass.  EXTREMITIES: No pedal edema, cyanosis, or clubbing.  NEUROLOGIC: Cranial nerves II through XII are intact. Muscle strength 5/5 in all extremities. Sensation intact. Gait not checked.  PSYCHIATRIC: The patient is alert and oriented x 3.  SKIN: No obvious rash, lesion, or ulcer.  DATA REVIEW:   CBC  Recent Labs Lab 05/31/17 0836  WBC 5.5  HGB 13.6  HCT 40.7  PLT 192    Chemistries   Recent Labs Lab 05/30/17 1532 05/31/17 0836  NA 142 141  K 3.6 4.1  CL 106 107  CO2 25 26  GLUCOSE 79 89  BUN 15 14  CREATININE 0.91 0.86  CALCIUM 9.4 8.9  AST 26  --   ALT 19  --   ALKPHOS 68  --   BILITOT 0.8  --      Microbiology Results  No results found for this or any previous visit.  RADIOLOGY:  US Venous Img Lower Bilateral  Result Date: 05/31/2017 CLINICAL DATA:  Left ankle swelling for the past  month. The patient fell approximately 3 weeks ago. History of breast malignancy and anticoagulant therapy. Positive D-dimer. EXAM: BILATERAL LOWER EXTREMITY VENOUS DOPPLER ULTRASOUND TECHNIQUE: Gray-scale sonography with graded compression, as well as color Doppler and duplex ultrasound were performed to evaluate the lower extremity deep venous systems from the level of the common femoral vein and including the common femoral, femoral, profunda femoral, popliteal and calf veins including the posterior tibial, peroneal and gastrocnemius veins when visible. The superficial great saphenous vein was also interrogated. Spectral Doppler was utilized to evaluate flow at rest and with distal augmentation maneuvers in the common femoral, femoral and popliteal veins. COMPARISON:  None. FINDINGS: RIGHT LOWER EXTREMITY Common Femoral Vein: No evidence of thrombus. Normal compressibility, respiratory phasicity and response to augmentation. Saphenofemoral Junction: No evidence of thrombus.  Normal compressibility and flow on color Doppler imaging. Profunda Femoral Vein: No evidence of thrombus. Normal compressibility and flow on color Doppler imaging. Femoral Vein: No evidence of thrombus. Normal compressibility, respiratory phasicity and response to augmentation. Popliteal Vein: No evidence of thrombus. Normal compressibility, respiratory phasicity and response to augmentation. Calf Veins: No evidence of thrombus. Normal compressibility and flow on color Doppler imaging. Superficial Great Saphenous Vein: No evidence of thrombus. Normal compressibility and flow on color Doppler imaging. Venous Reflux:  None. Other Findings:  None. LEFT LOWER EXTREMITY Common Femoral Vein: No evidence of thrombus. Normal compressibility, respiratory phasicity and response to augmentation. Saphenofemoral Junction: No evidence of thrombus. Normal compressibility and flow on color Doppler imaging. Profunda Femoral Vein: No evidence of thrombus. Normal compressibility and flow on color Doppler imaging. Femoral Vein: No evidence of thrombus. Normal compressibility, respiratory phasicity and response to augmentation. Popliteal Vein: No evidence of thrombus. Normal compressibility, respiratory phasicity and response to augmentation. Calf Veins: No evidence of thrombus. Normal compressibility and flow on color Doppler imaging. Superficial Great Saphenous Vein: No evidence of thrombus. Normal compressibility and flow on color Doppler imaging. Venous Reflux:  None. Other Findings:  None. IMPRESSION: No evidence of DVT within either lower extremity. Electronically Signed   By: David  Martinique M.D.   On: 05/31/2017 12:03   Dg Chest Portable 1 View  Result Date: 05/30/2017 CLINICAL DATA:  Tachycardia EXAM: PORTABLE CHEST 1 VIEW COMPARISON:  04/14/2013 FINDINGS: The heart size and mediastinal contours are within normal limits. Both lungs are clear. The visualized skeletal structures are unremarkable. IMPRESSION: No active disease.  Electronically Signed   By: Inez Catalina M.D.   On: 05/30/2017 15:51     Management plans discussed with the patient, her grandson and they are in agreement.  CODE STATUS: Full Code   TOTAL TIME TAKING CARE OF THIS PATIENT: 33 minutes.    Demetrios Loll M.D on 05/31/2017 at 3:24 PM  Between 7am to 6pm - Pager - [phone]  After 6pm go to [email or web address] - Proofreader  [facility]  Office  [phone]  CC: Primary care physician; Idelle Crouch, MD   Note: This dictation was prepared with Dragon dictation along with smaller phrase technology. Any transcriptional errors that result from this process are unintentional.

## 2017-05-31 NOTE — Progress Notes (Signed)
*  PRELIMINARY RESULTS* Echocardiogram 2D Echocardiogram has been performed.  Sherrie Sport 05/31/2017, 9:30 AM

## 2017-06-06 ENCOUNTER — Ambulatory Visit: Payer: Medicare Other | Admitting: Physical Therapy

## 2017-06-06 ENCOUNTER — Encounter: Payer: Self-pay | Admitting: Physical Therapy

## 2017-06-06 DIAGNOSIS — M25511 Pain in right shoulder: Principal | ICD-10-CM

## 2017-06-06 DIAGNOSIS — G8929 Other chronic pain: Secondary | ICD-10-CM

## 2017-06-06 DIAGNOSIS — M6281 Muscle weakness (generalized): Secondary | ICD-10-CM

## 2017-06-06 NOTE — Therapy (Signed)
[place] MAIN [facility] SERVICES [address] [place], [place], [address] Phone: [phone]   Fax:  [phone]  Physical Therapy Treatment  Patient Details  Name: Sheryl Michael MRN: [id number] Date of Birth: 06-13-1931 Referring Provider: Corky Mull   Encounter Date: 06/06/2017      PT End of Session - 06/06/17 0812    Visit Number 5   Number of Visits 17   Date for PT Re-Evaluation 07/12/17   Authorization Type 4/10 g codes  06/20/17 review goals`   PT Start Time 0800   PT Stop Time 0845   PT Time Calculation (min) 45 min   Activity Tolerance Patient limited by pain   Behavior During Therapy [facility] for tasks assessed/performed      Past Medical History:  Diagnosis Date  . Breast cancer ([place]) 2006   right breast ca with lumpectomy and rad tx  . Colon polyp   . Cystocele   . Depression   . Female bladder prolapse   . Goiter   . Hyperlipemia   . Procidentia of uterus   . Recurrent UTI   . Reflux   . TIA (transient ischemic attack)   . Vaginal atrophy     Past Surgical History:  Procedure Laterality Date  . APPENDECTOMY    . BREAST BIOPSY Right 2006   breast cancer  . BREAST SURGERY Right   . CYSTOCELE REPAIR N/A 12/21/2015   Procedure: ANTERIOR REPAIR (CYSTOCELE);  Surgeon: Brayton Mars, MD;  Location: [facility] ORS;  Service: Gynecology;  Laterality: N/A;  . DILATION AND CURETTAGE OF UTERUS    . VAGINAL HYSTERECTOMY Bilateral 12/21/2015   Procedure: TVH BSO;  Surgeon: Brayton Mars, MD;  Location: [facility] ORS;  Service: Gynecology;  Laterality: Bilateral;    There were no vitals filed for this visit.      Subjective Assessment - 06/06/17 0810    Subjective Pain states that the STM to her R upper trap and middle trap/rhomboid really helps decrease shoulder pain. Patient denies pain this morning but is nervous from recent hospital trip for heart condition   Pertinent History is experiencing pain and stiffness in her R  shoulder due to falling 2-3 weeks ago. The MD visit resulted in referral for physical therapy after x ray was negative for fractures. patient states her R shoulder is feeling worse; history of R rotator cuff tendonitis that was resolved with 2 months of physical therapy earlier this year. Patient completed PT for R shoulder pain from 3/18 - 4/18 with good results and describes 0/10 pain once discharged.  Patient recalls falling on her R shoulder 2-3 weeks ago when walking her daughter's dog due to foot getting caught on cement threshold. Patient describes hitting her head but suffered no further injuries; R shoulder is now worse than before. Patient describes tingling when R shoulder is over-worked and complains of occasional aching 1/10 pain in R upper trap and neck. Patient went to MD and MD referred her to physical therapy.    Limitations Reading;Sitting   How long can you sit comfortably? Unlimited   How long can you stand comfortably? Unlimted   How long can you walk comfortably? Unlimited   Diagnostic tests X-ray: negative for fractures   Patient Stated Goals Decrease pain, to be able to raise R hand above head, and return to working in the yard   Currently in Pain? No/denies   Pain Score 0-No pain   Multiple Pain Sites No  Treatment:  Hot pack to posterior R shoulder in prone  Ther-ex Pec stretch in doorway 5 x 15 seconds. Patient shows good recall of exercise; able to retract scapulae together to increase stretch PROM abduction with swiss ball 10 x 10 seconds. Patient sit on stool adjacent to table with swiss ball on it. Patient instructed to put R hand on top of ball and to lean forward to challenge abduction. Good stretch felt here. PROM of ER in supine 10 x 10 seconds. ER challenged in scaption; almost able to reach full ROM without pain. Serratus punches in supine 15x. Patient is able to perform good ROM here without increasing pain; good fatigue felt here Pulleys 10 x 10  seconds. Patient is able to perform full ROM pain-free.  Neuromuscular Re-education Scapular PNF in sidelying 10x. Patient is able to demonstrate good ability to retract and depress scapula against resistance   Manual Therapy  STM to R upper trap, R rhomboids and R levator scap 8 minutes. Mild decrease in muscular tone afterwards. Grade ll-lll inferior mobs with abduction bias 3 x 45. Patient is able to tolerate mobs well and states, "It feels good".                       PT Education - 06/06/17 [phone]    Education provided Yes   Education Details Continue to improve ROM by decreasing muscle gaurding    Person(s) Educated Patient;Child(ren)   Methods Explanation   Comprehension Verbalized understanding             PT Long Term Goals - 05/17/17 0941      PT LONG TERM GOAL #1   Title Pt will report no pain with daily activities (i.e. reaching up to cabinet, showering, etc)   Baseline 8/10 pain when reaching overhead   Time 8   Period Weeks   Status New   Target Date 07/12/17     PT LONG TERM GOAL #2   Title Pt will improve Quick Dash score by at least 8 pts to demonstrate improved functional use of RUE   Baseline 68%    Time 8   Period Weeks   Status New   Target Date 07/12/17     PT LONG TERM GOAL #3   Title Pt will improve R shoulder strength to 5/5 throughout with MMT to demonstrate improved strength and functoinal use of RUE   Baseline Abduction = 2/5  flexion = 3/5 IR = 3+/5   Time 8   Period Weeks   Status New   Target Date 07/12/17     PT LONG TERM GOAL #4   Title Pt's R shoulder AROM will be painfree and WNL to demonstrate improved functional use of RUE   Baseline R flexion painful = 100 degrees (L = 145 degrees) R abduction painful = 5 degrees (L abduction = 150 degrees)   Time 8   Period Weeks   Status New   Target Date 07/12/17     PT LONG TERM GOAL #5   Title --   Baseline --   Time --   Period --   Status --   Target Date --                Plan - 06/06/17 2992    Clinical Impression Statement Patient shows good improvement in muscle tone and ability to avoid muscle gaurding during R shoulder movements.  Patient is able to achieve full flexion pain-free AROM  but still experiences pain at ~90 degrees of abduction.  Patient will continue to benefit from skilled physical therapy to improve ROM of R shoulder and increase stability of rotator cuff musculature to perform ADLs without difficulty.   Rehab Potential Good   Clinical Impairments Affecting Rehab Potential Decreased ROM and decreased strength   PT Frequency 2x / week   PT Duration 8 weeks   PT Treatment/Interventions ADLs/Self Care Home Management;Biofeedback;Cryotherapy;Electrical Stimulation;Moist Heat;Gait training;Stair training;Functional mobility training;Therapeutic activities;Therapeutic exercise;Neuromuscular re-education;Patient/family education;Energy conservation;Manual techniques;Passive range of motion;Dry needling   PT Next Visit Plan Challenge abduction ROM    PT Home Exercise Plan doorway pec stretch and PROM abduction   Consulted and Agree with Plan of Care Patient      Patient will benefit from skilled therapeutic intervention in order to improve the following deficits and impairments:  Decreased range of motion, Impaired tone, Decreased activity tolerance, Pain, Hypomobility, Impaired flexibility, Improper body mechanics, Decreased mobility, Decreased strength  Visit Diagnosis: Chronic right shoulder pain  Muscle weakness (generalized)     Problem List Patient Active Problem List   Diagnosis Date Noted  . Arrhythmia 05/31/2017  . Atrial fibrillation with RVR ([place]) 05/30/2017  . Dyspnea 05/30/2017  . Shingles 12/29/2015  . S/P VH (vaginal hysterectomy) 12/22/2015  . Cystocele 12/21/2015  . Colon polyp 10/07/2015  . Bladder cystocele 10/07/2015  . Lower esophageal ring 10/07/2015  . H/O neoplasm 08/17/2015  . Malignant  neoplasm of breast ([place]) 04/01/2014  . Acid reflux 04/01/2014  . Big thyroid 04/01/2014  . HLD (hyperlipidemia) 04/01/2014  . OP (osteoporosis) 04/01/2014  . Temporary cerebral vascular dysfunction 04/01/2014  . H/O gastrointestinal disease 02/28/2014  This entire session was performed under direct supervision and direction of a licensed Chiropractor . I have personally read, edited and approve of the note as written. [facility] [address] [place], [place] DPT 06/06/2017, [place] MAIN [facility] SERVICES [address] [place], [place], [address] Phone: [phone]   Fax:  [phone]  Name: Sheryl Michael MRN: [id number] Date of Birth: 17-Jun-1931

## 2017-06-08 ENCOUNTER — Encounter: Payer: Self-pay | Admitting: Physical Therapy

## 2017-06-08 ENCOUNTER — Ambulatory Visit: Payer: Medicare Other | Admitting: Physical Therapy

## 2017-06-08 DIAGNOSIS — M25511 Pain in right shoulder: Principal | ICD-10-CM

## 2017-06-08 DIAGNOSIS — G8929 Other chronic pain: Secondary | ICD-10-CM

## 2017-06-08 DIAGNOSIS — M6281 Muscle weakness (generalized): Secondary | ICD-10-CM

## 2017-06-08 NOTE — Therapy (Signed)
[place] MAIN [facility] SERVICES [address] [place], [place], [address] Phone: [phone]   Fax:  [phone]  Physical Therapy Treatment  Patient Details  Name: Sheryl Michael MRN: [id number] Date of Birth: 01/27/1931 Referring Provider: Corky Mull   Encounter Date: 06/08/2017      PT End of Session - 06/08/17 0813    Visit Number 6   Number of Visits 17   Date for PT Re-Evaluation 07/12/17   Authorization Type 5/10 g codes  2017/06/24 review goals`   PT Start Time 0800   PT Stop Time 0845   PT Time Calculation (min) 45 min   Activity Tolerance Patient limited by pain   Behavior During Therapy [facility] for tasks assessed/performed      Past Medical History:  Diagnosis Date  . Breast cancer ([place]) 2006   right breast ca with lumpectomy and rad tx  . Colon polyp   . Cystocele   . Depression   . Female bladder prolapse   . Goiter   . Hyperlipemia   . Procidentia of uterus   . Recurrent UTI   . Reflux   . TIA (transient ischemic attack)   . Vaginal atrophy     Past Surgical History:  Procedure Laterality Date  . APPENDECTOMY    . BREAST BIOPSY Right 2006   breast cancer  . BREAST SURGERY Right   . CYSTOCELE REPAIR N/A 12/21/2015   Procedure: ANTERIOR REPAIR (CYSTOCELE);  Surgeon: Brayton Mars, MD;  Location: [facility] ORS;  Service: Gynecology;  Laterality: N/A;  . DILATION AND CURETTAGE OF UTERUS    . VAGINAL HYSTERECTOMY Bilateral 12/21/2015   Procedure: TVH BSO;  Surgeon: Brayton Mars, MD;  Location: [facility] ORS;  Service: Gynecology;  Laterality: Bilateral;    There were no vitals filed for this visit.      Subjective Assessment - 06/08/17 0812    Subjective Patient states being very sore in her R arm yesterday and did not have time to perform HEP. She feels good today.   Pertinent History is experiencing pain and stiffness in her R shoulder due to falling 2-3 weeks ago. The MD visit resulted in referral for physical  therapy after x ray was negative for fractures. patient states her R shoulder is feeling worse; history of R rotator cuff tendonitis that was resolved with 2 months of physical therapy earlier this year. Patient completed PT for R shoulder pain from 3/18 - 4/18 with good results and describes 0/10 pain once discharged.  Patient recalls falling on her R shoulder 2-3 weeks ago when walking her daughter's dog due to foot getting caught on cement threshold. Patient describes hitting her head but suffered no further injuries; R shoulder is now worse than before. Patient describes tingling when R shoulder is over-worked and complains of occasional aching 1/10 pain in R upper trap and neck. Patient went to MD and MD referred her to physical therapy.    Limitations Reading;Sitting   How long can you sit comfortably? Unlimited   How long can you stand comfortably? Unlimted   How long can you walk comfortably? Unlimited   Diagnostic tests X-ray: negative for fractures   Patient Stated Goals Decrease pain, to be able to raise R hand above head, and return to working in the yard   Currently in Pain? No/denies   Pain Score 0-No pain   Multiple Pain Sites No     Treatment:  Manual Therapy STM to R  levator scap and R upper trap 8 minutes. Significant reduction in muscle tone of R upper trap palpated today. Trigger point on R levator scap reduced; good pain relief  Hot pack to posterior shoulder in prone 8 minutes  Ther-ex Scapular retractions in prone 10x. Patient instructed to bring scapulae down and back while elevating B shoulders off of the table. Patient demonstrates good control once cue  Unilateral rows in prone 2 lbs 10x. Patient shows good sequencing of muscle control: scapular retracts before rowing motion begins. 2 lbs dumbbell tolerated well.  Pulleys for flexion 10x. Patient displays full ROM in flexion here; instructed to lean trunk forward for increased stretch. PROM abduction with swiss ball  on table 10x. Patient instructed to place R arm on swiss ball and to lean forward to challenge abduction  PROM internal rotation with strap across R shoulder 10x. Patient showed increased AROM after this exercise. Mild discomfort here t end ROM. Wall lift-offs bilaterally 10x.  Patient instructed to stand close to wall and slide hands up wall with thumb back. Once top ROM was reached, patient elevated hands off of the wall and held for 5 seconds to challenge scapular and shoulder stability. Mild elevation of R scap observed; mild success with scap depression after VCs and tactile cues.                              PT Education - 06/08/17 [phone]    Education provided Yes   Education Details Continue working on scapular control and muscle gaurding    Person(s) Educated Patient;Child(ren)   Methods Explanation   Comprehension Verbalized understanding             PT Long Term Goals - 05/17/17 0941      PT LONG TERM GOAL #1   Title Pt will report no pain with daily activities (i.e. reaching up to cabinet, showering, etc)   Baseline 8/10 pain when reaching overhead   Time 8   Period Weeks   Status New   Target Date 07/12/17     PT LONG TERM GOAL #2   Title Pt will improve Quick Dash score by at least 8 pts to demonstrate improved functional use of RUE   Baseline 68%    Time 8   Period Weeks   Status New   Target Date 07/12/17     PT LONG TERM GOAL #3   Title Pt will improve R shoulder strength to 5/5 throughout with MMT to demonstrate improved strength and functoinal use of RUE   Baseline Abduction = 2/5  flexion = 3/5 IR = 3+/5   Time 8   Period Weeks   Status New   Target Date 07/12/17     PT LONG TERM GOAL #4   Title Pt's R shoulder AROM will be painfree and WNL to demonstrate improved functional use of RUE   Baseline R flexion painful = 100 degrees (L = 145 degrees) R abduction painful = 5 degrees (L abduction = 150 degrees)   Time 8   Period  Weeks   Status New   Target Date 07/12/17     PT LONG TERM GOAL #5   Title --   Baseline --   Time --   Period --   Status --   Target Date --               Plan - 06/08/17 0845    Clinical  Impression Statement Patient continues to show improvement with scapular control and R shoulder mobility. Significant reduction of R upper trap tone palpated today; patient still has trigger points on R levator scap. New therex were tolerated well with minimal discomfort, but no pain.  Patient educated on soreness being normal with good understanding. Patient will continue to benefit from skilled physical therapy to improve R shoulder mobility and stability to increase ability to perform overhead ability.    Rehab Potential Good   Clinical Impairments Affecting Rehab Potential Decreased ROM and decreased strength   PT Frequency 2x / week   PT Duration 8 weeks   PT Treatment/Interventions ADLs/Self Care Home Management;Biofeedback;Cryotherapy;Electrical Stimulation;Moist Heat;Gait training;Stair training;Functional mobility training;Therapeutic activities;Therapeutic exercise;Neuromuscular re-education;Patient/family education;Energy conservation;Manual techniques;Passive range of motion;Dry needling   PT Next Visit Plan Continue to challenge abduction ROM and overall strength of shoulder girdle   PT Home Exercise Plan doorway pec stretch and PROM abduction   Consulted and Agree with Plan of Care Patient      Patient will benefit from skilled therapeutic intervention in order to improve the following deficits and impairments:  Decreased range of motion, Impaired tone, Decreased activity tolerance, Pain, Hypomobility, Impaired flexibility, Improper body mechanics, Decreased mobility, Decreased strength  Visit Diagnosis: Chronic right shoulder pain  Muscle weakness (generalized)     Problem List Patient Active Problem List   Diagnosis Date Noted  . Arrhythmia 05/31/2017  . Atrial  fibrillation with RVR ([place]) 05/30/2017  . Dyspnea 05/30/2017  . Shingles 12/29/2015  . S/P VH (vaginal hysterectomy) 12/22/2015  . Cystocele 12/21/2015  . Colon polyp 10/07/2015  . Bladder cystocele 10/07/2015  . Lower esophageal ring 10/07/2015  . H/O neoplasm 08/17/2015  . Malignant neoplasm of breast ([place]) 04/01/2014  . Acid reflux 04/01/2014  . Big thyroid 04/01/2014  . HLD (hyperlipidemia) 04/01/2014  . OP (osteoporosis) 04/01/2014  . Temporary cerebral vascular dysfunction 04/01/2014  . H/O gastrointestinal disease 02/28/2014  This entire session was performed under direct supervision and direction of a licensed Chiropractor . I have personally read, edited and approve of the note as written. [facility] [address] [place], [place] DPT 06/08/2017, 1:35 PM  [place] MAIN [facility] SERVICES [address] [place], [place], [address] Phone: [phone]   Fax:  [phone]  Name: Sheryl Michael MRN: [id number] Date of Birth: April 22, 1931

## 2017-06-13 ENCOUNTER — Encounter: Payer: Self-pay | Admitting: Physical Therapy

## 2017-06-13 ENCOUNTER — Ambulatory Visit: Payer: Medicare Other | Admitting: Physical Therapy

## 2017-06-13 DIAGNOSIS — M25511 Pain in right shoulder: Secondary | ICD-10-CM | POA: Diagnosis not present

## 2017-06-13 DIAGNOSIS — M6281 Muscle weakness (generalized): Secondary | ICD-10-CM

## 2017-06-13 DIAGNOSIS — G8929 Other chronic pain: Secondary | ICD-10-CM

## 2017-06-13 NOTE — Therapy (Signed)
Sheryl Michael [facility] SERVICES [address] [place], [place], [address] Phone: [phone]   Fax:  [phone]  Physical Therapy Treatment  Patient Details  Name: Sheryl Michael MRN: [id number] Date of Birth: 12/26/1930 Referring Provider: Corky Mull   Encounter Date: 06/13/2017      PT End of Session - 06/13/17 0808    Visit Number 7   Number of Visits 17   Date for PT Re-Evaluation 07/12/17   Authorization Type 7/10 g codes  07-10-2017 review goals`   PT Start Time 0800   PT Stop Time 0845   PT Time Calculation (min) 45 min   Activity Tolerance Patient limited by pain   Behavior During Therapy [facility] for tasks assessed/performed      Past Medical History:  Diagnosis Date  . Breast cancer ([place]) 2006   right breast ca with lumpectomy and rad tx  . Colon polyp   . Cystocele   . Depression   . Female bladder prolapse   . Goiter   . Hyperlipemia   . Procidentia of uterus   . Recurrent UTI   . Reflux   . TIA (transient ischemic attack)   . Vaginal atrophy     Past Surgical History:  Procedure Laterality Date  . APPENDECTOMY    . BREAST BIOPSY Right 2006   breast cancer  . BREAST SURGERY Right   . CYSTOCELE REPAIR N/A 12/21/2015   Procedure: ANTERIOR REPAIR (CYSTOCELE);  Surgeon: Brayton Mars, MD;  Location: [facility] ORS;  Service: Gynecology;  Laterality: N/A;  . DILATION AND CURETTAGE OF UTERUS    . VAGINAL HYSTERECTOMY Bilateral 12/21/2015   Procedure: TVH BSO;  Surgeon: Brayton Mars, MD;  Location: [facility] ORS;  Service: Gynecology;  Laterality: Bilateral;    There were no vitals filed for this visit.      Subjective Assessment - 06/13/17 0807    Subjective Patient states that she is doing her exercises once in a while.. She feels good today, no pain.    Pertinent History is experiencing pain and stiffness in her R shoulder due to falling 2-3 weeks ago. The MD visit resulted in referral for physical therapy after x  ray was negative for fractures. patient states her R shoulder is feeling worse; history of R rotator cuff tendonitis that was resolved with 2 months of physical therapy earlier this year. Patient completed PT for R shoulder pain from 3/18 - 4/18 with good results and describes 0/10 pain once discharged.  Patient recalls falling on her R shoulder 2-3 weeks ago when walking her daughter's dog due to foot getting caught on cement threshold. Patient describes hitting her head but suffered no further injuries; R shoulder is now worse than before. Patient describes tingling when R shoulder is over-worked and complains of occasional aching 1/10 pain in R upper trap and neck. Patient went to MD and MD referred her to physical therapy.    Limitations Reading;Sitting   How long can you sit comfortably? Unlimited   How long can you stand comfortably? Unlimted   How long can you walk comfortably? Unlimited   Diagnostic tests X-ray: negative for fractures   Patient Stated Goals Decrease pain, to be able to raise R hand above head, and return to working in the yard   Currently in Pain? No/denies   Pain Score 0-No pain   Pain Onset 1 to 4 weeks ago   Multiple Pain Sites No  Treatment: Ther-ex Scapular retractions in prone 10x. Patient instructed to bring scapulae down and back while elevating B shoulders off of the table. Patient demonstrates good control with cue   Unilateral rows in prone 2 lbs 10x. Patient shows good sequencing of muscle control: scapular retracts before rowing motion begins. 2 lbs dumbbell tolerated well.   Bench press with 1 lb x 20  sidelying ER with pillow underarm for 30 deg abd and 2 lbs x 20 x 2  sidelying abd without weight x 20   Pulleys for flexion 10x. Patient displays full ROM in flexion here; instructed to lean trunk forward for increased stretch.  PROM abduction with swiss ball on table 10x. Patient instructed to place R arm on swiss ball and to lean forward to  challenge abduction   Wall lift-offs bilaterally 10x.  Patient instructed to stand close to wall and slide hands up wall with thumb back. Once top ROM was reached, patient elevated hands off of the wall and held for 5 seconds to challenge scapular and shoulder stability. Mild elevation of R scap observed; mild success with scap depression after VCs and tactile cues.   Patient has no pain following therapy today                          PT Education - 06/13/17 0808    Education provided Yes   Education Details exercise technique   Person(s) Educated Patient   Methods Explanation;Demonstration   Comprehension Verbalized understanding;Returned demonstration             PT Long Term Goals - 06/13/17 0811      PT LONG TERM GOAL #1   Title Pt will report no pain with daily activities (i.e. reaching up to cabinet, showering, etc)   Baseline 2/10 pain when reaching overhead   Time 8   Period Weeks   Status New     PT LONG TERM GOAL #2   Title Pt will improve Quick Dash score by at least 8 pts to demonstrate improved functional use of RUE   Baseline 68%    Time 8   Period Weeks   Status On-going     PT LONG TERM GOAL #3   Title Pt will improve R shoulder strength to 5/5 throughout with MMT to demonstrate improved strength and functoinal use of RUE   Baseline Abduction = 2/5  flexion = 3/5 IR = 3+/5, abd 3/5, flex 3+/5, IR -4/5   Time 8   Period Weeks   Status Partially Met     PT LONG TERM GOAL #4   Title Pt's R shoulder AROM will be painfree and WNL to demonstrate improved functional use of RUE   Baseline R flexion painful = 140 degrees (L = 145 degrees) R abduction painful = 142 degrees (L abduction = 150 degrees)   Time 8   Period Weeks   Status Partially Met     PT LONG TERM GOAL #5   Title Pt's R shoulder AROM will be painfree and WNL to demonstrate improved functional use of RUE   Baseline stiffness with AROM   Time 8   Period Weeks   Status  Partially Met               Plan - 06/13/17 0809    Clinical Impression Statement Patient struggles with muscle guarding during PROM but improvement noted since last session.  STM to middle trap and R upper trap  significantly decreased R shoulder stiffness and pain.  Mobilizations resulted in mild improvement in ROM and patient described them feeling good.  Patient instructed on AAROM pulley exercise and was able to achieve full flexion ROM.  Patient will continue to benefit from skilled physical therapy to improve ROM of R shoulder and increase stability of rotator cuff to perform ADLs without difficulty.   Rehab Potential Good   Clinical Impairments Affecting Rehab Potential Decreased ROM and decreased strength   PT Frequency 2x / week   PT Duration 8 weeks   PT Treatment/Interventions ADLs/Self Care Home Management;Biofeedback;Cryotherapy;Electrical Stimulation;Moist Heat;Gait training;Stair training;Functional mobility training;Therapeutic activities;Therapeutic exercise;Neuromuscular re-education;Patient/family education;Energy conservation;Manual techniques;Passive range of motion;Dry needling   PT Next Visit Plan Continue to challenge abduction ROM and overall strength of shoulder girdle   PT Home Exercise Plan doorway pec stretch and PROM abduction   Consulted and Agree with Plan of Care Patient      Patient will benefit from skilled therapeutic intervention in order to improve the following deficits and impairments:  Decreased range of motion, Impaired tone, Decreased activity tolerance, Pain, Hypomobility, Impaired flexibility, Improper body mechanics, Decreased mobility, Decreased strength  Visit Diagnosis: Chronic right shoulder pain  Muscle weakness (generalized)     Problem List Patient Active Problem List   Diagnosis Date Noted  . Arrhythmia 05/31/2017  . Atrial fibrillation with RVR ([place]) 05/30/2017  . Dyspnea 05/30/2017  . Shingles 12/29/2015  . S/P VH  (vaginal hysterectomy) 12/22/2015  . Cystocele 12/21/2015  . Colon polyp 10/07/2015  . Bladder cystocele 10/07/2015  . Lower esophageal ring 10/07/2015  . H/O neoplasm 08/17/2015  . Malignant neoplasm of breast ([place]) 04/01/2014  . Acid reflux 04/01/2014  . Big thyroid 04/01/2014  . HLD (hyperlipidemia) 04/01/2014  . OP (osteoporosis) 04/01/2014  . Temporary cerebral vascular dysfunction 04/01/2014  . H/O gastrointestinal disease 02/28/2014    Alanson Puls, PT DPT 06/13/2017, 8:32 AM  [facility] SERVICES [address] [place], [place], [address] Phone: [phone]   Fax:  [phone]  Name: Sheryl Michael MRN: [id number] Date of Birth: 1931-08-29

## 2017-06-15 ENCOUNTER — Ambulatory Visit: Payer: Medicare Other | Admitting: Physical Therapy

## 2017-06-20 ENCOUNTER — Encounter: Payer: Self-pay | Admitting: Physical Therapy

## 2017-06-20 ENCOUNTER — Ambulatory Visit: Payer: Medicare Other | Attending: Surgery | Admitting: Physical Therapy

## 2017-06-20 DIAGNOSIS — G8929 Other chronic pain: Secondary | ICD-10-CM

## 2017-06-20 DIAGNOSIS — M6281 Muscle weakness (generalized): Secondary | ICD-10-CM | POA: Insufficient documentation

## 2017-06-20 DIAGNOSIS — M25511 Pain in right shoulder: Secondary | ICD-10-CM | POA: Diagnosis not present

## 2017-06-20 NOTE — Therapy (Signed)
[place] MAIN [facility] SERVICES [address] [place], [place], [address] Phone: [phone]   Fax:  [phone]  Physical Therapy Treatment  Patient Details  Name: Sheryl Michael MRN: [id number] Date of Birth: Aug 16, 1931 Referring Provider: Corky Mull   Encounter Date: 06/20/2017      PT End of Session - 06/20/17 0931    Visit Number 8   Number of Visits 17   Date for PT Re-Evaluation 07/12/17   Authorization Type 8/10 g codes  07-12-2017 review goals`   PT Start Time 0920   PT Stop Time 0945   PT Time Calculation (min) 25 min   Activity Tolerance Patient limited by pain;Patient tolerated treatment well   Behavior During Therapy [facility] for tasks assessed/performed      Past Medical History:  Diagnosis Date  . Breast cancer ([place]) 2006   right breast ca with lumpectomy and rad tx  . Colon polyp   . Cystocele   . Depression   . Female bladder prolapse   . Goiter   . Hyperlipemia   . Procidentia of uterus   . Recurrent UTI   . Reflux   . TIA (transient ischemic attack)   . Vaginal atrophy     Past Surgical History:  Procedure Laterality Date  . APPENDECTOMY    . BREAST BIOPSY Right 2006   breast cancer  . BREAST SURGERY Right   . CYSTOCELE REPAIR N/A 12/21/2015   Procedure: ANTERIOR REPAIR (CYSTOCELE);  Surgeon: Brayton Mars, MD;  Location: [facility] ORS;  Service: Gynecology;  Laterality: N/A;  . DILATION AND CURETTAGE OF UTERUS    . VAGINAL HYSTERECTOMY Bilateral 12/21/2015   Procedure: TVH BSO;  Surgeon: Brayton Mars, MD;  Location: [facility] ORS;  Service: Gynecology;  Laterality: Bilateral;    There were no vitals filed for this visit.      Subjective Assessment - 06/20/17 0927    Subjective Patient states that she  is doing so much better and she is able to perform more activities and she has some miminal pain when she moves it above her shoulder.    Pertinent History is experiencing pain and stiffness in her R  shoulder due to falling 2-3 weeks ago. The MD visit resulted in referral for physical therapy after x ray was negative for fractures. patient states her R shoulder is feeling worse; history of R rotator cuff tendonitis that was resolved with 2 months of physical therapy earlier this year. Patient completed PT for R shoulder pain from 3/18 - 4/18 with good results and describes 0/10 pain once discharged.  Patient recalls falling on her R shoulder 2-3 weeks ago when walking her daughter's dog due to foot getting caught on cement threshold. Patient describes hitting her head but suffered no further injuries; R shoulder is now worse than before. Patient describes tingling when R shoulder is over-worked and complains of occasional aching 1/10 pain in R upper trap and neck. Patient went to MD and MD referred her to physical therapy.    Limitations Reading;Sitting   How long can you sit comfortably? Unlimited   How long can you stand comfortably? Unlimted   How long can you walk comfortably? Unlimited   Diagnostic tests X-ray: negative for fractures   Patient Stated Goals Decrease pain, to be able to raise R hand above head, and return to working in the yard   Currently in Pain? Yes   Pain Score 3    Pain Location  Shoulder   Pain Orientation Right   Pain Descriptors / Indicators Aching   Pain Type Chronic pain   Pain Onset 1 to 4 weeks ago   Pain Frequency Intermittent   Aggravating Factors  lifting, reaching, raising arm above shoulder height   Pain Relieving Factors rest    Effect of Pain on Daily Activities difficult to perform yard work   Multiple Pain Sites No      Treatment: Ther-ex Scapular retractions in prone 10x.with 2 lbs  Patient instructed to bring scapulae down and back while elevating B shoulders off of the table. Patient demonstrates good control with cue   Unilateral rows in prone 2 lbs 10x. Patient shows good sequencing of muscle control: scapular retracts before rowing motion  begins. 2 lbs dumbbell tolerated well.   Bench press with 2 lb x 20  sidelying ER with pillow underarm for 30 deg abd and 2 lbs x 20 x 2  sidelying abd without weight x 20 , with weight x 2 lbs x 20   Wall lift-offs bilaterally 10x. Patient instructed to stand close to wall and slide hands up wall with thumb back.   Matrix with 2. 5  lbs x 20 scapula retraction   Matrix with 2. 5  lbs x 20 D1, D2 x 20  Patient has no pain following therapy today                           PT Education - 06/20/17 0930    Education provided Yes   Education Details exercise technique   Person(s) Educated Patient   Methods Explanation;Demonstration   Comprehension Verbalized understanding;Returned demonstration;Verbal cues required             PT Long Term Goals - 06/13/17 0811      PT LONG TERM GOAL #1   Title Pt will report no pain with daily activities (i.e. reaching up to cabinet, showering, etc)   Baseline 2/10 pain when reaching overhead   Time 8   Period Weeks   Status New     PT LONG TERM GOAL #2   Title Pt will improve Quick Dash score by at least 8 pts to demonstrate improved functional use of RUE   Baseline 68%    Time 8   Period Weeks   Status On-going     PT LONG TERM GOAL #3   Title Pt will improve R shoulder strength to 5/5 throughout with MMT to demonstrate improved strength and functoinal use of RUE   Baseline Abduction = 2/5  flexion = 3/5 IR = 3+/5, abd 3/5, flex 3+/5, IR -4/5   Time 8   Period Weeks   Status Partially Met     PT LONG TERM GOAL #4   Title Pt's R shoulder AROM will be painfree and WNL to demonstrate improved functional use of RUE   Baseline R flexion painful = 140 degrees (L = 145 degrees) R abduction painful = 142 degrees (L abduction = 150 degrees)   Time 8   Period Weeks   Status Partially Met     PT LONG TERM GOAL #5   Title Pt's R shoulder AROM will be painfree and WNL to demonstrate improved functional use  of RUE   Baseline stiffness with AROM   Time 8   Period Weeks   Status Partially Met               Plan - 06/20/17  3979    Clinical Impression Statement Patient continues to have some pain in end range of right shoulder abd and right shoulder flexion. She is able to perform AROM and RROM with light 1 and 2 lb weights She has WFL AROM and will continue to benefit from skiilled PT to improve strength , decrease pain and improve quailty of life.    Rehab Potential Good   Clinical Impairments Affecting Rehab Potential Decreased ROM and decreased strength   PT Frequency 2x / week   PT Duration 8 weeks   PT Treatment/Interventions ADLs/Self Care Home Management;Biofeedback;Cryotherapy;Electrical Stimulation;Moist Heat;Gait training;Stair training;Functional mobility training;Therapeutic activities;Therapeutic exercise;Neuromuscular re-education;Patient/family education;Energy conservation;Manual techniques;Passive range of motion;Dry needling   PT Next Visit Plan Continue to challenge abduction ROM and overall strength of shoulder girdle   PT Home Exercise Plan doorway pec stretch and PROM abduction   Consulted and Agree with Plan of Care Patient      Patient will benefit from skilled therapeutic intervention in order to improve the following deficits and impairments:  Decreased range of motion, Impaired tone, Decreased activity tolerance, Pain, Hypomobility, Impaired flexibility, Improper body mechanics, Decreased mobility, Decreased strength  Visit Diagnosis: Chronic right shoulder pain  Muscle weakness (generalized)     Problem List Patient Active Problem List   Diagnosis Date Noted  . Arrhythmia 05/31/2017  . Atrial fibrillation with RVR ([place]) 05/30/2017  . Dyspnea 05/30/2017  . Shingles 12/29/2015  . S/P VH (vaginal hysterectomy) 12/22/2015  . Cystocele 12/21/2015  . Colon polyp 10/07/2015  . Bladder cystocele 10/07/2015  . Lower esophageal ring 10/07/2015  . H/O  neoplasm 08/17/2015  . Malignant neoplasm of breast ([place]) 04/01/2014  . Acid reflux 04/01/2014  . Big thyroid 04/01/2014  . HLD (hyperlipidemia) 04/01/2014  . OP (osteoporosis) 04/01/2014  . Temporary cerebral vascular dysfunction 04/01/2014  . H/O gastrointestinal disease 02/28/2014    Alanson Puls, PT DPt 06/20/2017, 9:47 AM  [place] MAIN [facility] SERVICES [address] [place], [place], [address] Phone: [phone]   Fax:  [phone]  Name: Sheryl Michael MRN: [id number] Date of Birth: 03/14/1931

## 2017-06-22 ENCOUNTER — Encounter: Payer: Self-pay | Admitting: Physical Therapy

## 2017-06-22 ENCOUNTER — Ambulatory Visit: Payer: Medicare Other | Admitting: Physical Therapy

## 2017-06-22 DIAGNOSIS — M25511 Pain in right shoulder: Secondary | ICD-10-CM | POA: Diagnosis not present

## 2017-06-22 DIAGNOSIS — G8929 Other chronic pain: Secondary | ICD-10-CM

## 2017-06-22 DIAGNOSIS — M6281 Muscle weakness (generalized): Secondary | ICD-10-CM

## 2017-06-22 NOTE — Therapy (Addendum)
[place] MAIN [facility] SERVICES [address] [place], [place], [address] Phone: [phone]   Fax:  [phone]  Physical Therapy Treatment  Patient Details  Name: Sheryl Michael MRN: [id number] Date of Birth: 11/09/1930 Referring Provider: Corky Mull   Encounter Date: 06/22/2017      PT End of Session - 06/22/17 0927    Visit Number 9   Number of Visits 17   Date for PT Re-Evaluation 07/12/17   Authorization Type 9/10 g codes  07-16-17 review goals`   PT Start Time 0916   PT Stop Time 1000   PT Time Calculation (min) 44 min   Activity Tolerance Patient limited by pain;Patient tolerated treatment well   Behavior During Therapy [facility] for tasks assessed/performed      Past Medical History:  Diagnosis Date  . Breast cancer ([place]) 2006   right breast ca with lumpectomy and rad tx  . Colon polyp   . Cystocele   . Depression   . Female bladder prolapse   . Goiter   . Hyperlipemia   . Procidentia of uterus   . Recurrent UTI   . Reflux   . TIA (transient ischemic attack)   . Vaginal atrophy     Past Surgical History:  Procedure Laterality Date  . APPENDECTOMY    . BREAST BIOPSY Right 2006   breast cancer  . BREAST SURGERY Right   . CYSTOCELE REPAIR N/A 12/21/2015   Procedure: ANTERIOR REPAIR (CYSTOCELE);  Surgeon: Brayton Mars, MD;  Location: [facility] ORS;  Service: Gynecology;  Laterality: N/A;  . DILATION AND CURETTAGE OF UTERUS    . VAGINAL HYSTERECTOMY Bilateral 12/21/2015   Procedure: TVH BSO;  Surgeon: Brayton Mars, MD;  Location: [facility] ORS;  Service: Gynecology;  Laterality: Bilateral;    There were no vitals filed for this visit.      Subjective Assessment - 06/22/17 0926    Subjective Patient states that she  is doing so much better and she is able to perform more activities and she has no pain when she moves it above her shoulder.    Pertinent History is experiencing pain and stiffness in her R shoulder due to  falling 2-3 weeks ago. The MD visit resulted in referral for physical therapy after x ray was negative for fractures. patient states her R shoulder is feeling worse; history of R rotator cuff tendonitis that was resolved with 2 months of physical therapy earlier this year. Patient completed PT for R shoulder pain from 3/18 - 4/18 with good results and describes 0/10 pain once discharged.  Patient recalls falling on her R shoulder 2-3 weeks ago when walking her daughter's dog due to foot getting caught on cement threshold. Patient describes hitting her head but suffered no further injuries; R shoulder is now worse than before. Patient describes tingling when R shoulder is over-worked and complains of occasional aching 1/10 pain in R upper trap and neck. Patient went to MD and MD referred her to physical therapy.    Limitations Reading;Sitting   How long can you sit comfortably? Unlimited   How long can you stand comfortably? Unlimted   How long can you walk comfortably? Unlimited   Diagnostic tests X-ray: negative for fractures   Patient Stated Goals Decrease pain, to be able to raise R hand above head, and return to working in the yard   Currently in Pain? No/denies   Pain Score 0-No pain   Pain Onset 1  to 4 weeks ago   Multiple Pain Sites No      Treatment:  Scapular retractions in prone 10x.with 2 lbs  Patient instructed to bring scapulae down and back while elevating B shoulders off of the table. Patient demonstrates good control withcue   Unilateral rows in prone 2 lbs 10x. Patient shows good sequencing of muscle control: scapular retracts before rowing motion begins. 2 lbs dumbbell tolerated well.   Bench press with 3 lb x 20  sidelying ER with pillow underarm for 30 deg abd and 3 lbs x 20 x 2  sidelying abd without weight x 20 , with weight x 3 lbs x 20   Wall lift-offs bilaterally 10x. Patient instructed to stand close to wall and slide hands up wall with thumb back.    Matrix with 2. 5  lbs x 20 scapula retraction   Matrix with 2. 5  lbs x 20 D1, D2 x 20  Patient has no pain following therapy today                           PT Education - 06/22/17 0927    Education provided Yes   Education Details HEP progression   Person(s) Educated Patient   Methods Explanation;Demonstration   Comprehension Verbalized understanding;Returned demonstration             PT Long Term Goals - 06/13/17 0811      PT LONG TERM GOAL #1   Title Pt will report no pain with daily activities (i.e. reaching up to cabinet, showering, etc)   Baseline 2/10 pain when reaching overhead   Time 8   Period Weeks   Status New     PT LONG TERM GOAL #2   Title Pt will improve Quick Dash score by at least 8 pts to demonstrate improved functional use of RUE   Baseline 68%    Time 8   Period Weeks   Status On-going     PT LONG TERM GOAL #3   Title Pt will improve R shoulder strength to 5/5 throughout with MMT to demonstrate improved strength and functoinal use of RUE   Baseline Abduction = 2/5  flexion = 3/5 IR = 3+/5, abd 3/5, flex 3+/5, IR -4/5   Time 8   Period Weeks   Status Partially Met     PT LONG TERM GOAL #4   Title Pt's R shoulder AROM will be painfree and WNL to demonstrate improved functional use of RUE   Baseline R flexion painful = 140 degrees (L = 145 degrees) R abduction painful = 142 degrees (L abduction = 150 degrees)   Time 8   Period Weeks   Status Partially Met     PT LONG TERM GOAL #5   Title Pt's R shoulder AROM will be painfree and WNL to demonstrate improved functional use of RUE   Baseline stiffness with AROM   Time 8   Period Weeks   Status Partially Met               Plan - 06/22/17 8016    Clinical Impression Statement Patient shows good improvement in muscle tone and ability to avoid muscle gaurding during R shoulder movements.  Patient is able to achieve full flexion pain-free AROM without pain  at >90 degrees of abduction.  Patient will continue to benefit from skilled physical therapy to improve ROM of R shoulder and increase stability of rotator cuff musculature  to perform ADLs without difficulty.   Rehab Potential Good   Clinical Impairments Affecting Rehab Potential Decreased ROM and decreased strength   PT Frequency 2x / week   PT Duration 8 weeks   PT Treatment/Interventions ADLs/Self Care Home Management;Biofeedback;Cryotherapy;Electrical Stimulation;Moist Heat;Gait training;Stair training;Functional mobility training;Therapeutic activities;Therapeutic exercise;Neuromuscular re-education;Patient/family education;Energy conservation;Manual techniques;Passive range of motion;Dry needling   PT Next Visit Plan Continue to challenge abduction ROM and overall strength of shoulder girdle   PT Home Exercise Plan doorway pec stretch and PROM abduction   Consulted and Agree with Plan of Care Patient      Patient will benefit from skilled therapeutic intervention in order to improve the following deficits and impairments:  Decreased range of motion, Impaired tone, Decreased activity tolerance, Pain, Hypomobility, Impaired flexibility, Improper body mechanics, Decreased mobility, Decreased strength  Visit Diagnosis: No diagnosis found.     Problem List Patient Active Problem List   Diagnosis Date Noted  . Arrhythmia 05/31/2017  . Atrial fibrillation with RVR ([place]) 05/30/2017  . Dyspnea 05/30/2017  . Shingles 12/29/2015  . S/P VH (vaginal hysterectomy) 12/22/2015  . Cystocele 12/21/2015  . Colon polyp 10/07/2015  . Bladder cystocele 10/07/2015  . Lower esophageal ring 10/07/2015  . H/O neoplasm 08/17/2015  . Malignant neoplasm of breast ([place]) 04/01/2014  . Acid reflux 04/01/2014  . Big thyroid 04/01/2014  . HLD (hyperlipidemia) 04/01/2014  . OP (osteoporosis) 04/01/2014  . Temporary cerebral vascular dysfunction 04/01/2014  . H/O gastrointestinal disease 02/28/2014     Alanson Puls, PT DPt 06/22/2017, 9:29 AM  [place] MAIN [facility] SERVICES [address] [place], [place], [address] Phone: [phone]   Fax:  [phone]  Name: Sheryl Michael MRN: [id number] Date of Birth: Aug 03, 1931

## 2017-06-27 ENCOUNTER — Ambulatory Visit: Payer: Medicare Other | Admitting: Physical Therapy

## 2017-06-27 ENCOUNTER — Encounter: Payer: Self-pay | Admitting: Physical Therapy

## 2017-06-27 DIAGNOSIS — G8929 Other chronic pain: Secondary | ICD-10-CM

## 2017-06-27 DIAGNOSIS — M6281 Muscle weakness (generalized): Secondary | ICD-10-CM

## 2017-06-27 DIAGNOSIS — M25511 Pain in right shoulder: Principal | ICD-10-CM

## 2017-06-27 NOTE — Therapy (Signed)
[place] MAIN [facility] SERVICES [address] [place], [place], [address] Phone: [phone]   Fax:  [phone]  Physical Therapy Treatment  Patient Details  Name: Sheryl Michael MRN: [id number] Date of Birth: 09/03/1931 Referring Provider: Corky Mull   Encounter Date: 06/27/2017      PT End of Session - 06/27/17 0943    Visit Number 10   Number of Visits 17   Date for PT Re-Evaluation 07/12/17   Authorization Type 10/10 g codes  Jul 06, 2017 review goals`   PT Start Time 0937   PT Stop Time 1015   PT Time Calculation (min) 38 min   Activity Tolerance Patient limited by pain;Patient tolerated treatment well   Behavior During Therapy [facility] for tasks assessed/performed      Past Medical History:  Diagnosis Date  . Breast cancer ([place]) 2006   right breast ca with lumpectomy and rad tx  . Colon polyp   . Cystocele   . Depression   . Female bladder prolapse   . Goiter   . Hyperlipemia   . Procidentia of uterus   . Recurrent UTI   . Reflux   . TIA (transient ischemic attack)   . Vaginal atrophy     Past Surgical History:  Procedure Laterality Date  . APPENDECTOMY    . BREAST BIOPSY Right 2006   breast cancer  . BREAST SURGERY Right   . CYSTOCELE REPAIR N/A 12/21/2015   Procedure: ANTERIOR REPAIR (CYSTOCELE);  Surgeon: Brayton Mars, MD;  Location: [facility] ORS;  Service: Gynecology;  Laterality: N/A;  . DILATION AND CURETTAGE OF UTERUS    . VAGINAL HYSTERECTOMY Bilateral 12/21/2015   Procedure: TVH BSO;  Surgeon: Brayton Mars, MD;  Location: [facility] ORS;  Service: Gynecology;  Laterality: Bilateral;    There were no vitals filed for this visit.      Subjective Assessment - 06/27/17 0942    Subjective Patient states that she  is doing so much better and she is able to perform more activities and she has no pain when she moves it above her shoulder.    Pertinent History is experiencing pain and stiffness in her R shoulder due  to falling 2-3 weeks ago. The MD visit resulted in referral for physical therapy after x ray was negative for fractures. patient states her R shoulder is feeling worse; history of R rotator cuff tendonitis that was resolved with 2 months of physical therapy earlier this year. Patient completed PT for R shoulder pain from 3/18 - 4/18 with good results and describes 0/10 pain once discharged.  Patient recalls falling on her R shoulder 2-3 weeks ago when walking her daughter's dog due to foot getting caught on cement threshold. Patient describes hitting her head but suffered no further injuries; R shoulder is now worse than before. Patient describes tingling when R shoulder is over-worked and complains of occasional aching 1/10 pain in R upper trap and neck. Patient went to MD and MD referred her to physical therapy.    Limitations Reading;Sitting   How long can you sit comfortably? Unlimited   How long can you stand comfortably? Unlimted   How long can you walk comfortably? Unlimited   Diagnostic tests X-ray: negative for fractures   Patient Stated Goals Decrease pain, to be able to raise R hand above head, and return to working in the yard   Currently in Pain? No/denies   Pain Score 0-No pain   Pain Onset 1  to 4 weeks ago   Multiple Pain Sites No      Treatment:  UBE x 5 mins L1  Standing scapula retraction 7. 5 lbs x 20   Unilateral rows in prone 2 lbs 20x. Marland Kitchen   Bench press with 3lb x 20  Shoulder protraction with 3 lbs x 20  sidelying flex with 1 lb x 20  Supine 3 lbs horizontal abd./add x 20   Supine flex with 1 lbs bar weight x 20 , (unable to perform with 3 lbs due to increased right shoulder pain )  sidelying ER with pillow underarm for 30 deg abd and 2 lbs x 20 x 2  Prone BUE extension with palms up x 20   sidelying abd x 20 , with weight x 1 lbs x 20   Wall lift-offs bilaterally 10x. Patient instructed to stand close to wall and slide hands up wall with thumb back.    Matrix with 2. 5 lbs x 20 D1, D2 x 20  Patient has no pain following therapy today                           PT Education - 06/27/17 0942    Education provided Yes   Education Details HEP Progression   Person(s) Educated Patient   Methods Explanation;Demonstration;Verbal cues   Comprehension Verbalized understanding;Returned demonstration;Verbal cues required             PT Long Term Goals - 06/27/17 0953      PT LONG TERM GOAL #1   Title Pt will report no pain with daily activities (i.e. reaching up to cabinet, showering, etc)   Baseline 2/10 pain when reaching overhead   Time 8   Period Weeks   Status Partially Met     PT LONG TERM GOAL #2   Title Pt will improve Quick Dash score by at least 8 pts to demonstrate improved functional use of RUE   Baseline 68% , 15. 9 % 06/27/17   Time 8   Period Weeks   Status Partially Met     PT LONG TERM GOAL #3   Title Pt will improve R shoulder strength to 5/5 throughout with MMT to demonstrate improved strength and functoinal use of RUE   Baseline Abduction = 2/5  flexion = 3/5 IR = 3+/5, abd 3/5, flex 3+/5, IR -4/5   Time 8   Period Weeks   Status Partially Met     PT LONG TERM GOAL #4   Title Pt's R shoulder AROM will be painfree and WNL to demonstrate improved functional use of RUE   Baseline R flexion painful = 140 degrees (L = 145 degrees) R abduction painful = 142 degrees (L abduction = 150 degrees)   Time 8   Period Weeks   Status Partially Met     PT LONG TERM GOAL #5   Title Pt's R shoulder AROM will be painfree and WNL to demonstrate improved functional use of RUE   Baseline stiffness with AROM   Time 8   Period Weeks   Status Partially Met               Plan - 06/27/17 0950    Clinical Impression Statement Patient continues to show improvement with ROM and strength of right shoulder. She is having pain during reaching activities above her shoulder that have resistance. She  performs all her exercises with increased wieghts and no pain following  therapy. She will continue to benefit from skilled PT to improve RUE strength and improve ADL.    Rehab Potential Good   Clinical Impairments Affecting Rehab Potential Decreased ROM and decreased strength   PT Frequency 2x / week   PT Duration 8 weeks   PT Treatment/Interventions ADLs/Self Care Home Management;Biofeedback;Cryotherapy;Electrical Stimulation;Moist Heat;Gait training;Stair training;Functional mobility training;Therapeutic activities;Therapeutic exercise;Neuromuscular re-education;Patient/family education;Energy conservation;Manual techniques;Passive range of motion;Dry needling   PT Next Visit Plan Continue to challenge abduction ROM and overall strength of shoulder girdle   PT Home Exercise Plan doorway pec stretch and PROM abduction   Consulted and Agree with Plan of Care Patient      Patient will benefit from skilled therapeutic intervention in order to improve the following deficits and impairments:  Decreased range of motion, Impaired tone, Decreased activity tolerance, Pain, Hypomobility, Impaired flexibility, Improper body mechanics, Decreased mobility, Decreased strength  Visit Diagnosis: Chronic right shoulder pain  Muscle weakness (generalized)       G-Codes - 2017-07-18 0954    Functional Assessment Tool Used (Outpatient Only) quick dash, clinical judgement   Functional Limitation Mobility: Walking and moving around   Mobility: Walking and Moving Around Current Status ([id number]) At least 60 percent but less than 80 percent impaired, limited or restricted   Mobility: Walking and Moving Around Goal Status [phone]) At least 20 percent but less than 40 percent impaired, limited or restricted      Problem List Patient Active Problem List   Diagnosis Date Noted  . Arrhythmia 05/31/2017  . Atrial fibrillation with RVR ([place]) 05/30/2017  . Dyspnea 05/30/2017  . Shingles 12/29/2015  . S/P VH (vaginal  hysterectomy) 12/22/2015  . Cystocele 12/21/2015  . Colon polyp 10/07/2015  . Bladder cystocele 10/07/2015  . Lower esophageal ring 10/07/2015  . H/O neoplasm 08/17/2015  . Malignant neoplasm of breast ([place]) 04/01/2014  . Acid reflux 04/01/2014  . Big thyroid 04/01/2014  . HLD (hyperlipidemia) 04/01/2014  . OP (osteoporosis) 04/01/2014  . Temporary cerebral vascular dysfunction 04/01/2014  . H/O gastrointestinal disease 02/28/2014    Alanson Puls, PT DPT 2017/07/18, 9:57 AM  [place] MAIN [facility] SERVICES [address] [place], [place], [address] Phone: [phone]   Fax:  [phone]  Name: TINYA CADOGAN MRN: [id number] Date of Birth: 23-Sep-1931

## 2017-06-29 ENCOUNTER — Ambulatory Visit: Payer: Medicare Other | Admitting: Physical Therapy

## 2017-06-29 ENCOUNTER — Encounter: Payer: Self-pay | Admitting: Physical Therapy

## 2017-06-29 DIAGNOSIS — M6281 Muscle weakness (generalized): Secondary | ICD-10-CM

## 2017-06-29 DIAGNOSIS — M25511 Pain in right shoulder: Secondary | ICD-10-CM | POA: Diagnosis not present

## 2017-06-29 DIAGNOSIS — G8929 Other chronic pain: Secondary | ICD-10-CM

## 2017-06-29 NOTE — Therapy (Signed)
[place] MAIN [facility] SERVICES [address] [place], [place], [address] Phone: [phone]   Fax:  [phone]  Physical Therapy Treatment  Patient Details  Name: Sheryl Michael MRN: [id number] Date of Birth: March 19, 1931 Referring Provider: Corky Mull   Encounter Date: 06/29/2017      PT End of Session - 06/29/17 0955    Visit Number 11   Number of Visits 17   Date for PT Re-Evaluation 07/12/17   Authorization Type 10/10 g codes  2017/07/10 review goals`   PT Start Time 0938   PT Stop Time 1018   PT Time Calculation (min) 40 min   Activity Tolerance Patient limited by pain;Patient tolerated treatment well   Behavior During Therapy [facility] for tasks assessed/performed      Past Medical History:  Diagnosis Date  . Breast cancer ([place]) 2006   right breast ca with lumpectomy and rad tx  . Colon polyp   . Cystocele   . Depression   . Female bladder prolapse   . Goiter   . Hyperlipemia   . Procidentia of uterus   . Recurrent UTI   . Reflux   . TIA (transient ischemic attack)   . Vaginal atrophy     Past Surgical History:  Procedure Laterality Date  . APPENDECTOMY    . BREAST BIOPSY Right 2006   breast cancer  . BREAST SURGERY Right   . CYSTOCELE REPAIR N/A 12/21/2015   Procedure: ANTERIOR REPAIR (CYSTOCELE);  Surgeon: Brayton Mars, MD;  Location: [facility] ORS;  Service: Gynecology;  Laterality: N/A;  . DILATION AND CURETTAGE OF UTERUS    . VAGINAL HYSTERECTOMY Bilateral 12/21/2015   Procedure: TVH BSO;  Surgeon: Brayton Mars, MD;  Location: [facility] ORS;  Service: Gynecology;  Laterality: Bilateral;    There were no vitals filed for this visit.      Subjective Assessment - 06/29/17 0952    Subjective Patient states that she  is doing so much better and she is able to perform more activities and she has no pain when she moves it above her shoulder. She is able to raise her arm wihtout pain.    Pertinent History is experiencing  pain and stiffness in her R shoulder due to falling 2-3 weeks ago. The MD visit resulted in referral for physical therapy after x ray was negative for fractures. patient states her R shoulder is feeling worse; history of R rotator cuff tendonitis that was resolved with 2 months of physical therapy earlier this year. Patient completed PT for R shoulder pain from 3/18 - 4/18 with good results and describes 0/10 pain once discharged.  Patient recalls falling on her R shoulder 2-3 weeks ago when walking her daughter's dog due to foot getting caught on cement threshold. Patient describes hitting her head but suffered no further injuries; R shoulder is now worse than before. Patient describes tingling when R shoulder is over-worked and complains of occasional aching 1/10 pain in R upper trap and neck. Patient went to MD and MD referred her to physical therapy.    Limitations Reading;Sitting   How long can you sit comfortably? Unlimited   How long can you stand comfortably? Unlimted   How long can you walk comfortably? Unlimited   Diagnostic tests X-ray: negative for fractures   Patient Stated Goals Decrease pain, to be able to raise R hand above head, and return to working in the yard   Currently in Pain? No/denies  Pain Score 0-No pain   Pain Onset 1 to 4 weeks ago   Multiple Pain Sites No      Treatment:  UBE x 5 mins L1  Standing scapula retraction 12.5 lbs x 20   Unilateral rows in standing trunk flex  2 lbs 20x. Marland Kitchen   Bench press with 3lb x 20  Shoulder protraction with 3 lbs x 20  sidelying flex with 1 lb x 20, patient has pain returning to initial position  Supine 3 lbs horizontal abd./add x 20   Supine flex with 1 lbs bar weight x 20 , (unable to perform with 3 lbs due to increased right shoulder pain )  sidelying ER with pillow underarm for 30 deg abd and 2lbs x 20 x 2  sidelying abd x 20 , with weight x 1lbs x 20   Wall lift-offs bilaterally 10x. Patient  instructed to stand close to wall and slide hands up wall with thumb back.   Matrix with 2. 5 lbs x 20 D1, D2 x 20  theraball around the door frame x 10 BUE  Small rainbow ball over head throw x 10  Patient has no pain following therapy today. She needs cues for correct technique and positioning.                             PT Education - 06/29/17 0954    Education provided Yes   Education Details HEP   Person(s) Educated Patient   Methods Explanation;Demonstration   Comprehension Verbalized understanding;Returned demonstration             PT Long Term Goals - 06/27/17 0953      PT LONG TERM GOAL #1   Title Pt will report no pain with daily activities (i.e. reaching up to cabinet, showering, etc)   Baseline 2/10 pain when reaching overhead   Time 8   Period Weeks   Status Partially Met     PT LONG TERM GOAL #2   Title Pt will improve Quick Dash score by at least 8 pts to demonstrate improved functional use of RUE   Baseline 68% , 15. 9 % 06/27/17   Time 8   Period Weeks   Status Partially Met     PT LONG TERM GOAL #3   Title Pt will improve R shoulder strength to 5/5 throughout with MMT to demonstrate improved strength and functoinal use of RUE   Baseline Abduction = 2/5  flexion = 3/5 IR = 3+/5, abd 3/5, flex 3+/5, IR -4/5   Time 8   Period Weeks   Status Partially Met     PT LONG TERM GOAL #4   Title Pt's R shoulder AROM will be painfree and WNL to demonstrate improved functional use of RUE   Baseline R flexion painful = 140 degrees (L = 145 degrees) R abduction painful = 142 degrees (L abduction = 150 degrees)   Time 8   Period Weeks   Status Partially Met     PT LONG TERM GOAL #5   Title Pt's R shoulder AROM will be painfree and WNL to demonstrate improved functional use of RUE   Baseline stiffness with AROM   Time 8   Period Weeks   Status Partially Met               Plan - 06/29/17 0957    Clinical Impression  Statement Patient continues to show improvement with ROM  and strength of right shoulder. She is having pain during reaching activities above her shoulder that have resistance and pain with end range that gets better as she performs increased repetitions.  She performs all her exercises with increased wieghts and no pain following therapy. She will continue to benefit from skilled PT to improve RUE strength and improve ADL.   Rehab Potential Good   Clinical Impairments Affecting Rehab Potential Decreased ROM and decreased strength   PT Frequency 2x / week   PT Duration 8 weeks   PT Treatment/Interventions ADLs/Self Care Home Management;Biofeedback;Cryotherapy;Electrical Stimulation;Moist Heat;Gait training;Stair training;Functional mobility training;Therapeutic activities;Therapeutic exercise;Neuromuscular re-education;Patient/family education;Energy conservation;Manual techniques;Passive range of motion;Dry needling   PT Next Visit Plan Continue to challenge abduction ROM and overall strength of shoulder girdle   PT Home Exercise Plan doorway pec stretch and PROM abduction   Consulted and Agree with Plan of Care Patient      Patient will benefit from skilled therapeutic intervention in order to improve the following deficits and impairments:  Decreased range of motion, Impaired tone, Decreased activity tolerance, Pain, Hypomobility, Impaired flexibility, Improper body mechanics, Decreased mobility, Decreased strength  Visit Diagnosis: Chronic right shoulder pain  Muscle weakness (generalized)     Problem List Patient Active Problem List   Diagnosis Date Noted  . Arrhythmia 05/31/2017  . Atrial fibrillation with RVR ([place]) 05/30/2017  . Dyspnea 05/30/2017  . Shingles 12/29/2015  . S/P VH (vaginal hysterectomy) 12/22/2015  . Cystocele 12/21/2015  . Colon polyp 10/07/2015  . Bladder cystocele 10/07/2015  . Lower esophageal ring 10/07/2015  . H/O neoplasm 08/17/2015  . Malignant  neoplasm of breast ([place]) 04/01/2014  . Acid reflux 04/01/2014  . Big thyroid 04/01/2014  . HLD (hyperlipidemia) 04/01/2014  . OP (osteoporosis) 04/01/2014  . Temporary cerebral vascular dysfunction 04/01/2014  . H/O gastrointestinal disease 02/28/2014    Alanson Puls, PT DPT 06/29/2017, 10:13 AM  [place] MAIN [facility] SERVICES [address] [place], [place], [address] Phone: [phone]   Fax:  [phone]  Name: SAIDA LONON MRN: [id number] Date of Birth: Jul 05, 1931

## 2017-07-04 ENCOUNTER — Ambulatory Visit: Payer: Medicare Other | Admitting: Physical Therapy

## 2017-07-04 ENCOUNTER — Encounter: Payer: Self-pay | Admitting: Physical Therapy

## 2017-07-04 DIAGNOSIS — M6281 Muscle weakness (generalized): Secondary | ICD-10-CM

## 2017-07-04 DIAGNOSIS — G8929 Other chronic pain: Secondary | ICD-10-CM

## 2017-07-04 DIAGNOSIS — M25511 Pain in right shoulder: Principal | ICD-10-CM

## 2017-07-04 NOTE — Therapy (Signed)
[place] MAIN [facility] SERVICES [address] [place], [place], [address] Phone: [phone]   Fax:  [phone]  Physical Therapy Treatment  Patient Details  Name: Sheryl Michael MRN: [id number] Date of Birth: 20-Sep-1931 Referring Provider: Corky Mull   Encounter Date: 07/04/2017      PT End of Session - 07/04/17 0945    Visit Number 12   Number of Visits 17   Date for PT Re-Evaluation 07/12/17   Authorization Type 2/10 g codes  2017-07-15 review goals`   PT Start Time 0932   PT Stop Time 1015   PT Time Calculation (min) 43 min   Activity Tolerance Patient limited by pain;Patient tolerated treatment well   Behavior During Therapy [facility] for tasks assessed/performed      Past Medical History:  Diagnosis Date  . Breast cancer ([place]) 2006   right breast ca with lumpectomy and rad tx  . Colon polyp   . Cystocele   . Depression   . Female bladder prolapse   . Goiter   . Hyperlipemia   . Procidentia of uterus   . Recurrent UTI   . Reflux   . TIA (transient ischemic attack)   . Vaginal atrophy     Past Surgical History:  Procedure Laterality Date  . APPENDECTOMY    . BREAST BIOPSY Right 2006   breast cancer  . BREAST SURGERY Right   . CYSTOCELE REPAIR N/A 12/21/2015   Procedure: ANTERIOR REPAIR (CYSTOCELE);  Surgeon: Brayton Mars, MD;  Location: [facility] ORS;  Service: Gynecology;  Laterality: N/A;  . DILATION AND CURETTAGE OF UTERUS    . VAGINAL HYSTERECTOMY Bilateral 12/21/2015   Procedure: TVH BSO;  Surgeon: Brayton Mars, MD;  Location: [facility] ORS;  Service: Gynecology;  Laterality: Bilateral;    There were no vitals filed for this visit.      Subjective Assessment - 07/04/17 0944    Subjective Patient states that she  is doing so much better and she is able to perform all her home activities wihtout increasing pain.    Pertinent History is experiencing pain and stiffness in her R shoulder due to falling 2-3 weeks ago.  The MD visit resulted in referral for physical therapy after x ray was negative for fractures. patient states her R shoulder is feeling worse; history of R rotator cuff tendonitis that was resolved with 2 months of physical therapy earlier this year. Patient completed PT for R shoulder pain from 3/18 - 4/18 with good results and describes 0/10 pain once discharged.  Patient recalls falling on her R shoulder 2-3 weeks ago when walking her daughter's dog due to foot getting caught on cement threshold. Patient describes hitting her head but suffered no further injuries; R shoulder is now worse than before. Patient describes tingling when R shoulder is over-worked and complains of occasional aching 1/10 pain in R upper trap and neck. Patient went to MD and MD referred her to physical therapy.    Limitations Reading;Sitting   How long can you sit comfortably? Unlimited   How long can you stand comfortably? Unlimted   How long can you walk comfortably? Unlimited   Diagnostic tests X-ray: negative for fractures   Patient Stated Goals Decrease pain, to be able to raise R hand above head, and return to working in the yard   Currently in Pain? No/denies   Pain Score 0-No pain   Pain Onset 1 to 4 weeks ago   Multiple  Pain Sites No       Treatment:  UBE x 5 mins L1,  2.5 mins fwd/ 2.5 mis bwd  Standing scapula retraction 20 lbs x 20 x 2  Unilateral rows in standing trunk flex  2 lbs 20x.2 .   Bench press with 3lb x 20  Shoulder protraction with 3 lbs x 20  sidelying flex with 1 lb x 20, patient has pain returning to initial position  Supine 3 lbs horizontal abd./add x 20   Supine flex with 1 lbs bar weight x 20 , (unable to perform with 3 lbs due to increased right shoulder pain )  sidelying ER with pillow underarm for 30 deg abd and 2lbs x 20 x 2  sidelying abd x 10 , with weight x 1lbs x 10 , patient has pain returning to initial position  Wall lift-offs bilaterally 10x.  Patient instructed to stand close to wall and slide hands up wall with thumb back.   Matrix with 7. 5 lbs x 20 D1, D2 x 20  theraball around the door frame x 10 BUE  Small rainbow ball over head throw x 10  Patient has no pain following therapy today. She needs cues for correct technique and positioning.                            PT Education - 07/04/17 0945    Education provided Yes   Education Details HEP   Person(s) Educated Patient   Methods Explanation;Demonstration   Comprehension Verbalized understanding;Returned demonstration             PT Long Term Goals - 06/27/17 0953      PT LONG TERM GOAL #1   Title Pt will report no pain with daily activities (i.e. reaching up to cabinet, showering, etc)   Baseline 2/10 pain when reaching overhead   Time 8   Period Weeks   Status Partially Met     PT LONG TERM GOAL #2   Title Pt will improve Quick Dash score by at least 8 pts to demonstrate improved functional use of RUE   Baseline 68% , 15. 9 % 06/27/17   Time 8   Period Weeks   Status Partially Met     PT LONG TERM GOAL #3   Title Pt will improve R shoulder strength to 5/5 throughout with MMT to demonstrate improved strength and functoinal use of RUE   Baseline Abduction = 2/5  flexion = 3/5 IR = 3+/5, abd 3/5, flex 3+/5, IR -4/5   Time 8   Period Weeks   Status Partially Met     PT LONG TERM GOAL #4   Title Pt's R shoulder AROM will be painfree and WNL to demonstrate improved functional use of RUE   Baseline R flexion painful = 140 degrees (L = 145 degrees) R abduction painful = 142 degrees (L abduction = 150 degrees)   Time 8   Period Weeks   Status Partially Met     PT LONG TERM GOAL #5   Title Pt's R shoulder AROM will be painfree and WNL to demonstrate improved functional use of RUE   Baseline stiffness with AROM   Time 8   Period Weeks   Status Partially Met               Plan - 07/04/17 0946    Clinical  Impression Statement Patient is progressing with her strength in RUE  and is able to perform ADL's and chores at home without increasing her pain to her right shoulder. She has WNL AROM right shoulder and 3+/5 strength R shoulder abd and flex. She will continue to benefit from skilled PT to imrpove strength without pain behaviors.    Rehab Potential Good   Clinical Impairments Affecting Rehab Potential Decreased ROM and decreased strength   PT Frequency 2x / week   PT Duration 8 weeks   PT Treatment/Interventions ADLs/Self Care Home Management;Biofeedback;Cryotherapy;Electrical Stimulation;Moist Heat;Gait training;Stair training;Functional mobility training;Therapeutic activities;Therapeutic exercise;Neuromuscular re-education;Patient/family education;Energy conservation;Manual techniques;Passive range of motion;Dry needling   PT Next Visit Plan Continue to challenge abduction ROM and overall strength of shoulder girdle   PT Home Exercise Plan doorway pec stretch and PROM abduction   Consulted and Agree with Plan of Care Patient      Patient will benefit from skilled therapeutic intervention in order to improve the following deficits and impairments:  Decreased range of motion, Impaired tone, Decreased activity tolerance, Pain, Hypomobility, Impaired flexibility, Improper body mechanics, Decreased mobility, Decreased strength  Visit Diagnosis: Chronic right shoulder pain  Muscle weakness (generalized)     Problem List Patient Active Problem List   Diagnosis Date Noted  . Arrhythmia 05/31/2017  . Atrial fibrillation with RVR ([place]) 05/30/2017  . Dyspnea 05/30/2017  . Shingles 12/29/2015  . S/P VH (vaginal hysterectomy) 12/22/2015  . Cystocele 12/21/2015  . Colon polyp 10/07/2015  . Bladder cystocele 10/07/2015  . Lower esophageal ring 10/07/2015  . H/O neoplasm 08/17/2015  . Malignant neoplasm of breast ([place]) 04/01/2014  . Acid reflux 04/01/2014  . Big thyroid 04/01/2014  . HLD  (hyperlipidemia) 04/01/2014  . OP (osteoporosis) 04/01/2014  . Temporary cerebral vascular dysfunction 04/01/2014  . H/O gastrointestinal disease 02/28/2014    Alanson Puls, PT DPT 07/04/2017, 10:09 AM  [place] MAIN [facility] SERVICES [address] [place], [place], [address] Phone: [phone]   Fax:  [phone]  Name: Sheryl Michael MRN: [id number] Date of Birth: [birth year]

## 2017-07-06 ENCOUNTER — Encounter: Payer: Self-pay | Admitting: Physical Therapy

## 2017-07-06 ENCOUNTER — Ambulatory Visit: Payer: Medicare Other | Admitting: Physical Therapy

## 2017-07-06 DIAGNOSIS — M25511 Pain in right shoulder: Principal | ICD-10-CM

## 2017-07-06 DIAGNOSIS — G8929 Other chronic pain: Secondary | ICD-10-CM

## 2017-07-06 DIAGNOSIS — M6281 Muscle weakness (generalized): Secondary | ICD-10-CM

## 2017-07-06 NOTE — Therapy (Signed)
[place] MAIN [facility] SERVICES [address] [place], [place], [address] Phone: [phone]   Fax:  [phone]  Physical Therapy Treatment  Patient Details  Name: Sheryl Michael MRN: [id number] Date of Birth: 02/14/1931 Referring Provider: Corky Mull   Encounter Date: 07/06/2017      PT End of Session - 07/06/17 0932    Visit Number 13   Number of Visits 17   Date for PT Re-Evaluation 07/12/17   Authorization Type 3/10 g codes  2017/07/13 review goals`   PT Start Time 0928   PT Stop Time 1010   PT Time Calculation (min) 42 min   Activity Tolerance Patient tolerated treatment well;No increased pain   Behavior During Therapy WFL for tasks assessed/performed      Past Medical History:  Diagnosis Date  . Breast cancer ([place]) 2006   right breast ca with lumpectomy and rad tx  . Colon polyp   . Cystocele   . Depression   . Female bladder prolapse   . Goiter   . Hyperlipemia   . Procidentia of uterus   . Recurrent UTI   . Reflux   . TIA (transient ischemic attack)   . Vaginal atrophy     Past Surgical History:  Procedure Laterality Date  . APPENDECTOMY    . BREAST BIOPSY Right 2006   breast cancer  . BREAST SURGERY Right   . CYSTOCELE REPAIR N/A 12/21/2015   Procedure: ANTERIOR REPAIR (CYSTOCELE);  Surgeon: Brayton Mars, MD;  Location: [facility] ORS;  Service: Gynecology;  Laterality: N/A;  . DILATION AND CURETTAGE OF UTERUS    . VAGINAL HYSTERECTOMY Bilateral 12/21/2015   Procedure: TVH BSO;  Surgeon: Brayton Mars, MD;  Location: [facility] ORS;  Service: Gynecology;  Laterality: Bilateral;    There were no vitals filed for this visit.      Subjective Assessment - 07/06/17 0930    Subjective Pt reports that she feels good today and denies any pain, just mild soreness of R shoulder.  Pt states that she was able to clean her bathroom and clean floors in her home without increase in pain.     Pertinent History is experiencing  pain and stiffness in her R shoulder due to falling 2-3 weeks ago. The MD visit resulted in referral for physical therapy after x ray was negative for fractures. patient states her R shoulder is feeling worse; history of R rotator cuff tendonitis that was resolved with 2 months of physical therapy earlier this year. Patient completed PT for R shoulder pain from 3/18 - 4/18 with good results and describes 0/10 pain once discharged.  Patient recalls falling on her R shoulder 2-3 weeks ago when walking her daughter's dog due to foot getting caught on cement threshold. Patient describes hitting her head but suffered no further injuries; R shoulder is now worse than before. Patient describes tingling when R shoulder is over-worked and complains of occasional aching 1/10 pain in R upper trap and neck. Patient went to MD and MD referred her to physical therapy.    Limitations Reading;Sitting   How long can you sit comfortably? Unlimited   How long can you stand comfortably? Unlimted   How long can you walk comfortably? Unlimited   Diagnostic tests X-ray: negative for fractures   Patient Stated Goals Decrease pain, to be able to raise R hand above head, and return to working in the yard   Currently in Pain? No/denies   Pain  Score 0-No pain   Pain Location Shoulder   Pain Orientation Right   Pain Descriptors / Indicators Sore   Pain Onset 1 to 4 weeks ago      Treatment:  SciFit warmup x 5 min  Supine chest press with 3# rod x 20 reps  Supine scapular protraction press with 3# rod x 20 reps  Supine Horizontal ABD/ADD with 3# rod x 20 reps  Supine shoulder flexion with 1# rod x 20 reps    Sidelying shoulder ABD R UE - 20 reps with no weight, 20 reps with 1#  Sidelying shoulder flexion x 20 R UE with 1#  Sidelying R shoulder ER x 20 with 2# - cues for form and proper shoulder alignment  Scapular retraction on Matrix with 12.5 lbs x 20 - cues for form and speed  D1 pattern with 7.5lbs on  Matrix x 20 reps R Ue - cues for form  D2 pattern with 7.5lbs on Matrix x 20 reps   Tricep pull downs with 7.5lbs on Matrix using tricep rope attachment x 20 reps - cues for technique  Scapular stabilization at wall using foam ball with R UE: 20 reps counterclockwise, clockwise, up/down, side-to-side   UE wall steps with red theraband (Shar[place]ova's) x 20 leading with R UE and 20 reps leading with L UE - cues for form and proper exercise sequence  Lower trap lift offs from wall using red theraband x 20 reps - cues for form and to keep tension in theraband for proper muscle activation   Wall wipes with green swissball x 10 reps each direction - cues for proper sequence                             PT Education - 07/06/17 0932    Education provided Yes   Education Details use of ice/heat and rest to help reduce soreness after housework   Person(s) Educated Patient   Methods Explanation   Comprehension Verbalized understanding             PT Long Term Goals - 06/27/17 0953      PT LONG TERM GOAL #1   Title Pt will report no pain with daily activities (i.e. reaching up to cabinet, showering, etc)   Baseline 2/10 pain when reaching overhead   Time 8   Period Weeks   Status Partially Met     PT LONG TERM GOAL #2   Title Pt will improve Quick Dash score by at least 8 pts to demonstrate improved functional use of RUE   Baseline 68% , 15. 9 % 06/27/17   Time 8   Period Weeks   Status Partially Met     PT LONG TERM GOAL #3   Title Pt will improve R shoulder strength to 5/5 throughout with MMT to demonstrate improved strength and functoinal use of RUE   Baseline Abduction = 2/5  flexion = 3/5 IR = 3+/5, abd 3/5, flex 3+/5, IR -4/5   Time 8   Period Weeks   Status Partially Met     PT LONG TERM GOAL #4   Title Pt's R shoulder AROM will be painfree and WNL to demonstrate improved functional use of RUE   Baseline R flexion painful = 140 degrees (L = 145  degrees) R abduction painful = 142 degrees (L abduction = 150 degrees)   Time 8   Period Weeks   Status Partially Met  PT LONG TERM GOAL #5   Title Pt's R shoulder AROM will be painfree and WNL to demonstrate improved functional use of RUE   Baseline stiffness with AROM   Time 8   Period Weeks   Status Partially Met               Plan - 07/06/17 1009    Clinical Impression Statement Pt continues to progress well through all strength and mobility activities.  New exercises were added today in order to target scapular stabilization muscles in order to further increase overall strength and stability of the R shoulder girdle.  Pt tolerated all exercises well, with decreased need for cues and no increase in pain.  Pt would continue to benefit from skilled therapy services in order to continue strengthening R shoulder musculature and improve overall mobility without increased fatigue and pain.   Rehab Potential Good   Clinical Impairments Affecting Rehab Potential Decreased ROM and decreased strength   PT Frequency 2x / week   PT Duration 8 weeks   PT Treatment/Interventions ADLs/Self Care Home Management;Biofeedback;Cryotherapy;Electrical Stimulation;Moist Heat;Gait training;Stair training;Functional mobility training;Therapeutic activities;Therapeutic exercise;Neuromuscular re-education;Patient/family education;Energy conservation;Manual techniques;Passive range of motion;Dry needling   PT Next Visit Plan Continue to challenge abduction ROM and overall strength of shoulder girdle   PT Home Exercise Plan doorway pec stretch and PROM abduction   Consulted and Agree with Plan of Care Patient      Patient will benefit from skilled therapeutic intervention in order to improve the following deficits and impairments:  Decreased range of motion, Impaired tone, Decreased activity tolerance, Pain, Hypomobility, Impaired flexibility, Improper body mechanics, Decreased mobility, Decreased  strength  Visit Diagnosis: Chronic right shoulder pain  Muscle weakness (generalized)     Problem List Patient Active Problem List   Diagnosis Date Noted  . Arrhythmia 05/31/2017  . Atrial fibrillation with RVR ([place]) 05/30/2017  . Dyspnea 05/30/2017  . Shingles 12/29/2015  . S/P VH (vaginal hysterectomy) 12/22/2015  . Cystocele 12/21/2015  . Colon polyp 10/07/2015  . Bladder cystocele 10/07/2015  . Lower esophageal ring 10/07/2015  . H/O neoplasm 08/17/2015  . Malignant neoplasm of breast ([place]) 04/01/2014  . Acid reflux 04/01/2014  . Big thyroid 04/01/2014  . HLD (hyperlipidemia) 04/01/2014  . OP (osteoporosis) 04/01/2014  . Temporary cerebral vascular dysfunction 04/01/2014  . H/O gastrointestinal disease 02/28/2014   This entire session was performed under direct supervision and direction of a licensed Chiropractor . I have personally read, edited and approve of the note as written. Netta Corrigan, SPT Alanson Puls, PT, DPT 07/06/2017, 10:12 AM  [place] MAIN [facility] SERVICES [address] [place], [place], [address] Phone: [phone]   Fax:  [phone]  Name: Sheryl Michael MRN: [id number] Date of Birth: 10/17/1931

## 2017-07-10 ENCOUNTER — Other Ambulatory Visit: Payer: Self-pay | Admitting: Internal Medicine

## 2017-07-10 DIAGNOSIS — E039 Hypothyroidism, unspecified: Secondary | ICD-10-CM | POA: Insufficient documentation

## 2017-07-10 DIAGNOSIS — R05 Cough: Secondary | ICD-10-CM

## 2017-07-10 DIAGNOSIS — R059 Cough, unspecified: Secondary | ICD-10-CM

## 2017-07-11 ENCOUNTER — Ambulatory Visit: Payer: Medicare Other | Admitting: Physical Therapy

## 2017-07-11 ENCOUNTER — Encounter: Payer: Self-pay | Admitting: Physical Therapy

## 2017-07-11 DIAGNOSIS — G8929 Other chronic pain: Secondary | ICD-10-CM

## 2017-07-11 DIAGNOSIS — M25511 Pain in right shoulder: Principal | ICD-10-CM

## 2017-07-11 DIAGNOSIS — M6281 Muscle weakness (generalized): Secondary | ICD-10-CM

## 2017-07-11 NOTE — Therapy (Addendum)
[place] MAIN [facility] SERVICES [address] [place], [place], [address] Phone: [phone]   Fax:  [phone]  Physical Therapy Treatment  Patient Details  Name: Sheryl Michael MRN: [id number] Date of Birth: [birth year] Referring Provider: Corky Mull   Encounter Date: 07/11/2017      PT End of Session - 07/11/17 0938    Visit Number 14   Number of Visits 17   Date for PT Re-Evaluation 07/12/17   Authorization Type 4/10 g codes  06-25-2017 review goals`   PT Start Time 0930   PT Stop Time 1008   PT Time Calculation (min) 38 min   Activity Tolerance Patient tolerated treatment well;No increased pain   Behavior During Therapy WFL for tasks assessed/performed      Past Medical History:  Diagnosis Date  . Breast cancer ([place]) 2006   right breast ca with lumpectomy and rad tx  . Colon polyp   . Cystocele   . Depression   . Female bladder prolapse   . Goiter   . Hyperlipemia   . Procidentia of uterus   . Recurrent UTI   . Reflux   . TIA (transient ischemic attack)   . Vaginal atrophy     Past Surgical History:  Procedure Laterality Date  . APPENDECTOMY    . BREAST BIOPSY Right 2006   breast cancer  . BREAST SURGERY Right   . CYSTOCELE REPAIR N/A 12/21/2015   Procedure: ANTERIOR REPAIR (CYSTOCELE);  Surgeon: Brayton Mars, MD;  Location: [facility] ORS;  Service: Gynecology;  Laterality: N/A;  . DILATION AND CURETTAGE OF UTERUS    . VAGINAL HYSTERECTOMY Bilateral 12/21/2015   Procedure: TVH BSO;  Surgeon: Brayton Mars, MD;  Location: [facility] ORS;  Service: Gynecology;  Laterality: Bilateral;    There were no vitals filed for this visit.      Subjective Assessment - 07/11/17 0937    Subjective Pt reports that she feels good today and denies any pain, She feels that she is able to do more at her home with less pain.    Pertinent History is experiencing pain and stiffness in her R shoulder due to falling 2-3 weeks ago. The MD  visit resulted in referral for physical therapy after x ray was negative for fractures. patient states her R shoulder is feeling worse; history of R rotator cuff tendonitis that was resolved with 2 months of physical therapy earlier this year. Patient completed PT for R shoulder pain from 3/18 - 4/18 with good results and describes 0/10 pain once discharged.  Patient recalls falling on her R shoulder 2-3 weeks ago when walking her daughter's dog due to foot getting caught on cement threshold. Patient describes hitting her head but suffered no further injuries; R shoulder is now worse than before. Patient describes tingling when R shoulder is over-worked and complains of occasional aching 1/10 pain in R upper trap and neck. Patient went to MD and MD referred her to physical therapy.    Limitations Reading;Sitting   How long can you sit comfortably? Unlimited   How long can you stand comfortably? Unlimted   How long can you walk comfortably? Unlimited   Diagnostic tests X-ray: negative for fractures   Patient Stated Goals Decrease pain, to be able to raise R hand above head, and return to working in the yard   Currently in Pain? No/denies   Pain Score 0-No pain   Pain Onset 1 to 4 weeks ago  Multiple Pain Sites No      Treatment:   UBE warmup x 5 min  Window washes with pillowcase with flex and abd/add x 20 and flex ,cues for proper sequence  Scapular retraction on Matrix with 12.5 lbs x 20 - cues for form and speed  D1 pattern with 7.5lbs on Matrix x 20 reps R Ue - cues for form  D2 pattern with 7.5lbs on Matrix x 20 reps   Tricep pull downs with 7.5lbs on Matrix using tricep rope attachment x 20 reps - cues for technique  Scapular stabilization at wall using thera ball with R UE: 20 reps counterclockwise, clockwise,   UE wall steps with red theraband (Sharapova's) x 20 leading with R UE and 20 reps leading with L UE - cues for form and proper exercise  sequence                                PT Education - 07/11/17 0938    Education provided Yes   Education Details HEP    Person(s) Educated Patient   Methods Explanation   Comprehension Verbalized understanding;Returned demonstration             PT Long Term Goals - 06/27/17 0953      PT LONG TERM GOAL #1   Title Pt will report no pain with daily activities (i.e. reaching up to cabinet, showering, etc)   Baseline 2/10 pain when reaching overhead   Time 8   Period Weeks   Status Partially Met     PT LONG TERM GOAL #2   Title Pt will improve Quick Dash score by at least 8 pts to demonstrate improved functional use of RUE   Baseline 68% , 15. 9 % 06/27/17   Time 8   Period Weeks   Status Partially Met     PT LONG TERM GOAL #3   Title Pt will improve R shoulder strength to 5/5 throughout with MMT to demonstrate improved strength and functoinal use of RUE   Baseline Abduction = 2/5  flexion = 3/5 IR = 3+/5, abd 3/5, flex 3+/5, IR -4/5   Time 8   Period Weeks   Status Partially Met     PT LONG TERM GOAL #4   Title Pt's R shoulder AROM will be painfree and WNL to demonstrate improved functional use of RUE   Baseline R flexion painful = 140 degrees (L = 145 degrees) R abduction painful = 142 degrees (L abduction = 150 degrees)   Time 8   Period Weeks   Status Partially Met     PT LONG TERM GOAL #5   Title Pt's R shoulder AROM will be painfree and WNL to demonstrate improved functional use of RUE   Baseline stiffness with AROM   Time 8   Period Weeks   Status Partially Met               Plan - 07/11/17 0939    Clinical Impression Statement Patient reports no pain and ability to perform more exercises without pain behaviors during treatment. She performs resisted exerciswith weights  and machines with improved ROM and no end range pain. She will continue to benefit from skilled PT to progress HEP and continue to reach goals.    Rehab  Potential Good   PT Frequency 2x / week   PT Duration 8 weeks   PT Treatment/Interventions ADLs/Self Care Home Management;Biofeedback;Cryotherapy;Dealer  Stimulation;Moist Heat;Gait training;Stair training;Functional mobility training;Therapeutic activities;Therapeutic exercise;Neuromuscular re-education;Patient/family education;Energy conservation;Manual techniques;Passive range of motion;Dry needling   PT Next Visit Plan Continue to challenge abduction ROM and overall strength of shoulder girdle   PT Home Exercise Plan doorway pec stretch and PROM abduction   Consulted and Agree with Plan of Care Patient      Patient will benefit from skilled therapeutic intervention in order to improve the following deficits and impairments:  Decreased range of motion, Impaired tone, Decreased activity tolerance, Pain, Hypomobility, Impaired flexibility, Improper body mechanics, Decreased mobility, Decreased strength  Visit Diagnosis: Chronic right shoulder pain  Muscle weakness (generalized)     Problem List Patient Active Problem List   Diagnosis Date Noted  . Arrhythmia 05/31/2017  . Atrial fibrillation with RVR ([place]) 05/30/2017  . Dyspnea 05/30/2017  . Shingles 12/29/2015  . S/P VH (vaginal hysterectomy) 12/22/2015  . Cystocele 12/21/2015  . Colon polyp 10/07/2015  . Bladder cystocele 10/07/2015  . Lower esophageal ring 10/07/2015  . H/O neoplasm 08/17/2015  . Malignant neoplasm of breast ([place]) 04/01/2014  . Acid reflux 04/01/2014  . Big thyroid 04/01/2014  . HLD (hyperlipidemia) 04/01/2014  . OP (osteoporosis) 04/01/2014  . Temporary cerebral vascular dysfunction 04/01/2014  . H/O gastrointestinal disease 02/28/2014    Arelia Sneddon S,PT DPT 07/11/2017, 10:15 AM  [place] MAIN [facility] SERVICES [address] [place], [place], [address] Phone: [phone]   Fax:  [phone]  Name: Sheryl Michael MRN: [id number] Date of Birth:  [birth year]

## 2017-07-13 ENCOUNTER — Ambulatory Visit: Payer: Medicare Other | Admitting: Physical Therapy

## 2017-07-13 ENCOUNTER — Encounter: Payer: Self-pay | Admitting: Physical Therapy

## 2017-07-13 DIAGNOSIS — M6281 Muscle weakness (generalized): Secondary | ICD-10-CM

## 2017-07-13 DIAGNOSIS — G8929 Other chronic pain: Secondary | ICD-10-CM

## 2017-07-13 DIAGNOSIS — M25511 Pain in right shoulder: Secondary | ICD-10-CM | POA: Diagnosis not present

## 2017-07-13 NOTE — Therapy (Signed)
[place] MAIN [facility] SERVICES [address] [place], [place], [address] Phone: [phone]   Fax:  [phone]  Physical Therapy Treatment/ Discharge Summary  Patient Details  Name: Sheryl Michael MRN: [id number] Date of Birth: [birth year] Referring Provider: Corky Mull   Encounter Date: 07/13/2017      PT End of Session - 07/13/17 0950    Visit Number 15   Number of Visits 17   Date for PT Re-Evaluation 07/12/17   Authorization Type 5/10 g codes  2017/06/28 review goals`   PT Start Time 0930   PT Stop Time 1010   PT Time Calculation (min) 40 min   Activity Tolerance Patient tolerated treatment well;No increased pain   Behavior During Therapy WFL for tasks assessed/performed      Past Medical History:  Diagnosis Date  . Breast cancer ([place]) 2006   right breast ca with lumpectomy and rad tx  . Colon polyp   . Cystocele   . Depression   . Female bladder prolapse   . Goiter   . Hyperlipemia   . Procidentia of uterus   . Recurrent UTI   . Reflux   . TIA (transient ischemic attack)   . Vaginal atrophy     Past Surgical History:  Procedure Laterality Date  . APPENDECTOMY    . BREAST BIOPSY Right 2006   breast cancer  . BREAST SURGERY Right   . CYSTOCELE REPAIR N/A 12/21/2015   Procedure: ANTERIOR REPAIR (CYSTOCELE);  Surgeon: Brayton Mars, MD;  Location: [facility] ORS;  Service: Gynecology;  Laterality: N/A;  . DILATION AND CURETTAGE OF UTERUS    . VAGINAL HYSTERECTOMY Bilateral 12/21/2015   Procedure: TVH BSO;  Surgeon: Brayton Mars, MD;  Location: [facility] ORS;  Service: Gynecology;  Laterality: Bilateral;    There were no vitals filed for this visit.      Subjective Assessment - 07/13/17 0949    Subjective Pt reports that she feels good today and denies any pain, She feels that she is able to do more at her home with less pain.    Pertinent History is experiencing pain and stiffness in her R shoulder due to falling 2-3  weeks ago. The MD visit resulted in referral for physical therapy after x ray was negative for fractures. patient states her R shoulder is feeling worse; history of R rotator cuff tendonitis that was resolved with 2 months of physical therapy earlier this year. Patient completed PT for R shoulder pain from 3/18 - 4/18 with good results and describes 0/10 pain once discharged.  Patient recalls falling on her R shoulder 2-3 weeks ago when walking her daughter's dog due to foot getting caught on cement threshold. Patient describes hitting her head but suffered no further injuries; R shoulder is now worse than before. Patient describes tingling when R shoulder is over-worked and complains of occasional aching 1/10 pain in R upper trap and neck. Patient went to MD and MD referred her to physical therapy.    Limitations Reading;Sitting   How long can you sit comfortably? Unlimited   How long can you stand comfortably? Unlimted   How long can you walk comfortably? Unlimited   Diagnostic tests X-ray: negative for fractures   Patient Stated Goals Decrease pain, to be able to raise R hand above head, and return to working in the yard   Currently in Pain? No/denies   Pain Score 0-No pain   Pain Onset 1 to 4 weeks  ago      Treatment:  UBE warmup x 5 min  Supine chest press with 3# rod x 20 reps  Supine scapular protraction press with 3# rod x 20 reps  Supine Horizontal ABD/ADD with 3# rod x 20 reps  Supine shoulder flexion with 1# rod x 20 reps    Sidelying shoulder ABD R UE - 20 reps with no weight, 20 reps with 1#  Sidelying shoulder flexion x 20 R UE with 1#  Scapular retraction on Matrix with 12.5 lbs x 20 - cues for form and speed  D1 pattern with 7.5lbs on Matrix x 20 reps R Ue - cues for form  D2 pattern with 7.5lbs on Matrix x 20 reps   Tricep pull downs with 7.5lbs on Matrix using tricep rope attachment x 20 reps - cues for technique  Scapular stabilization at wall using  foam ball with R UE: 20 reps counterclockwise, clockwise, up/down, side-to-side   UE wall steps with red theraband (Sharapova's) x 20 leading with R UE and 20 reps leading with L UE - cues for form and proper exercise sequence  Lower trap lift offs from wall using red theraband x 20 reps - cues for form and to keep tension in theraband for proper muscle activation   Wall wipes with green swissball x 10 reps each direction - cues for proper sequence                           PT Education - 07/13/17 0950    Education provided Yes   Education Details HEP   Person(s) Educated Patient   Methods Explanation;Demonstration   Comprehension Verbalized understanding;Returned demonstration             PT Long Term Goals - 06/27/17 0953      PT LONG TERM GOAL #1   Title Pt will report no pain with daily activities (i.e. reaching up to cabinet, showering, etc)   Baseline 2/10 pain when reaching overhead   Time 8   Period Weeks   Status Partially Met     PT LONG TERM GOAL #2   Title Pt will improve Quick Dash score by at least 8 pts to demonstrate improved functional use of RUE   Baseline 68% , 15. 9 % 06/27/17   Time 8   Period Weeks   Status Partially Met     PT LONG TERM GOAL #3   Title Pt will improve R shoulder strength to 5/5 throughout with MMT to demonstrate improved strength and functoinal use of RUE   Baseline Abduction = 2/5  flexion = 3/5 IR = 3+/5, abd 3/5, flex 3+/5, IR -4/5   Time 8   Period Weeks   Status Partially Met     PT LONG TERM GOAL #4   Title Pt's R shoulder AROM will be painfree and WNL to demonstrate improved functional use of RUE   Baseline R flexion painful = 140 degrees (L = 145 degrees) R abduction painful = 142 degrees (L abduction = 150 degrees)   Time 8   Period Weeks   Status Partially Met     PT LONG TERM GOAL #5   Title Pt's R shoulder AROM will be painfree and WNL to demonstrate improved functional use of RUE    Baseline stiffness with AROM   Time 8   Period Weeks   Status Partially Met  Plan - 2017-08-12 0951    Clinical Impression Statement Patient reports no pain and is able to perform therapeutic exercises with resistance in open chain and closed chain progressions. She has WNL AROM to right shoulder without pain in end ranges. She has no pain behaviors  with exercises. Patient has a 4.5 % quick dash. She will be discharged to HEP .    Rehab Potential Good   PT Frequency 2x / week   PT Duration 8 weeks   PT Treatment/Interventions ADLs/Self Care Home Management;Biofeedback;Cryotherapy;Electrical Stimulation;Moist Heat;Gait training;Stair training;Functional mobility training;Therapeutic activities;Therapeutic exercise;Neuromuscular re-education;Patient/family education;Energy conservation;Manual techniques;Passive range of motion;Dry needling   PT Next Visit Plan Continue to challenge abduction ROM and overall strength of shoulder girdle   PT Home Exercise Plan doorway pec stretch and PROM abduction   Consulted and Agree with Plan of Care Patient      Patient will benefit from skilled therapeutic intervention in order to improve the following deficits and impairments:  Decreased range of motion, Impaired tone, Decreased activity tolerance, Pain, Hypomobility, Impaired flexibility, Improper body mechanics, Decreased mobility, Decreased strength  Visit Diagnosis: No diagnosis found.       G-Codes - Aug 12, 2017 1003    Functional Assessment Tool Used (Outpatient Only) quick dash, clinical judgement   Functional Limitation Mobility: Walking and moving around   Mobility: Walking and Moving Around Current Status [phone]) At least 60 percent but less than 80 percent impaired, limited or restricted   Mobility: Walking and Moving Around Goal Status [phone]) At least 20 percent but less than 40 percent impaired, limited or restricted   Mobility: Walking and Moving Around Discharge  Status [phone]) At least 1 percent but less than 20 percent impaired, limited or restricted      Problem List Patient Active Problem List   Diagnosis Date Noted  . Arrhythmia 05/31/2017  . Atrial fibrillation with RVR ([place]) 05/30/2017  . Dyspnea 05/30/2017  . Shingles 12/29/2015  . S/P VH (vaginal hysterectomy) 12/22/2015  . Cystocele 12/21/2015  . Colon polyp 10/07/2015  . Bladder cystocele 10/07/2015  . Lower esophageal ring 10/07/2015  . H/O neoplasm 08/17/2015  . Malignant neoplasm of breast ([place]) 04/01/2014  . Acid reflux 04/01/2014  . Big thyroid 04/01/2014  . HLD (hyperlipidemia) 04/01/2014  . OP (osteoporosis) 04/01/2014  . Temporary cerebral vascular dysfunction 04/01/2014  . H/O gastrointestinal disease 02/28/2014    Alanson Puls,  PT DPT 08/12/17, 10:11 AM  [place] MAIN [facility] SERVICES [address] [place], [place], [address] Phone: [phone]   Fax:  [phone]  Name: BARBARANN KELLY MRN: [id number] Date of Birth: 1931/05/28

## 2017-07-20 ENCOUNTER — Ambulatory Visit
Admission: RE | Admit: 2017-07-20 | Discharge: 2017-07-20 | Disposition: A | Discharge status: Home / Self Care | Payer: Medicare Other | Source: Ambulatory Visit | Attending: Internal Medicine | Admitting: Internal Medicine

## 2017-07-20 DIAGNOSIS — J432 Centrilobular emphysema: Secondary | ICD-10-CM | POA: Diagnosis not present

## 2017-07-20 DIAGNOSIS — I700 Atherosclerosis of aorta: Secondary | ICD-10-CM | POA: Diagnosis not present

## 2017-07-20 DIAGNOSIS — R05 Cough: Secondary | ICD-10-CM | POA: Insufficient documentation

## 2017-07-20 DIAGNOSIS — J984 Other disorders of lung: Secondary | ICD-10-CM | POA: Diagnosis not present

## 2017-07-20 DIAGNOSIS — R059 Cough, unspecified: Secondary | ICD-10-CM

## 2017-07-20 DIAGNOSIS — I2510 Atherosclerotic heart disease of native coronary artery without angina pectoris: Secondary | ICD-10-CM | POA: Insufficient documentation

## 2017-07-20 DIAGNOSIS — K449 Diaphragmatic hernia without obstruction or gangrene: Secondary | ICD-10-CM | POA: Insufficient documentation

## 2017-07-20 DIAGNOSIS — R918 Other nonspecific abnormal finding of lung field: Secondary | ICD-10-CM | POA: Diagnosis not present

## 2017-08-25 ENCOUNTER — Emergency Department: Payer: Medicare Other

## 2017-08-25 ENCOUNTER — Encounter: Payer: Self-pay | Admitting: Emergency Medicine

## 2017-08-25 ENCOUNTER — Emergency Department
Admission: EM | Admit: 2017-08-25 | Discharge: 2017-08-25 | Disposition: A | Discharge status: Home / Self Care | Payer: Medicare Other | Attending: Emergency Medicine | Admitting: Emergency Medicine

## 2017-08-25 ENCOUNTER — Other Ambulatory Visit: Payer: Self-pay

## 2017-08-25 DIAGNOSIS — S29019A Strain of muscle and tendon of unspecified wall of thorax, initial encounter: Secondary | ICD-10-CM

## 2017-08-25 DIAGNOSIS — Z79899 Other long term (current) drug therapy: Secondary | ICD-10-CM | POA: Insufficient documentation

## 2017-08-25 DIAGNOSIS — Z7901 Long term (current) use of anticoagulants: Secondary | ICD-10-CM | POA: Insufficient documentation

## 2017-08-25 DIAGNOSIS — Y999 Unspecified external cause status: Secondary | ICD-10-CM | POA: Diagnosis not present

## 2017-08-25 DIAGNOSIS — S29011A Strain of muscle and tendon of front wall of thorax, initial encounter: Secondary | ICD-10-CM | POA: Diagnosis not present

## 2017-08-25 DIAGNOSIS — Y92009 Unspecified place in unspecified non-institutional (private) residence as the place of occurrence of the external cause: Secondary | ICD-10-CM

## 2017-08-25 DIAGNOSIS — W19XXXA Unspecified fall, initial encounter: Secondary | ICD-10-CM

## 2017-08-25 DIAGNOSIS — W109XXA Fall (on) (from) unspecified stairs and steps, initial encounter: Secondary | ICD-10-CM | POA: Insufficient documentation

## 2017-08-25 DIAGNOSIS — Z7982 Long term (current) use of aspirin: Secondary | ICD-10-CM | POA: Diagnosis not present

## 2017-08-25 DIAGNOSIS — Y9301 Activity, walking, marching and hiking: Secondary | ICD-10-CM | POA: Diagnosis not present

## 2017-08-25 DIAGNOSIS — Z8673 Personal history of transient ischemic attack (TIA), and cerebral infarction without residual deficits: Secondary | ICD-10-CM | POA: Insufficient documentation

## 2017-08-25 DIAGNOSIS — Y92008 Other place in unspecified non-institutional (private) residence as the place of occurrence of the external cause: Secondary | ICD-10-CM | POA: Diagnosis not present

## 2017-08-25 DIAGNOSIS — S300XXA Contusion of lower back and pelvis, initial encounter: Secondary | ICD-10-CM

## 2017-08-25 DIAGNOSIS — S3992XA Unspecified injury of lower back, initial encounter: Secondary | ICD-10-CM | POA: Diagnosis present

## 2017-08-25 NOTE — ED Provider Notes (Signed)
[facility] Emergency Department Provider Note ____________________________________________  Time seen: 1407  I have reviewed the triage vital signs and the nursing notes.  HISTORY  Chief Complaint  Fall  HPI Sheryl Michael is a 83 y.o. female presents to the ED company by family member, for evaluation of injury following a mechanical fall.  Patient describes she slipped and fell on wet steps outside of her deck.  She fell, landing on her buttocks and right side.  She presented with tenderness and pain to the right gluteal region, and some tenderness to the right lower rib area.  She denies any head injury, nausea, loss of consciousness, or syncope.  She denies being winded, have a laceration, prescription, or abrasions.  She does note she had a palpable tender knot to the right buttocks immediately following the fall.  She does take a daily blood thinner as well as Cardizem for her a-fib.  Past Medical History:  Diagnosis Date  . Breast cancer ([place]) 2006   right breast ca with lumpectomy and rad tx  . Colon polyp   . Cystocele   . Depression   . Female bladder prolapse   . Goiter   . Hyperlipemia   . Procidentia of uterus   . Recurrent UTI   . Reflux   . TIA (transient ischemic attack)   . Vaginal atrophy     Patient Active Problem List   Diagnosis Date Noted  . Arrhythmia 05/31/2017  . Atrial fibrillation with RVR ([place]) 05/30/2017  . Dyspnea 05/30/2017  . Shingles 12/29/2015  . S/P VH (vaginal hysterectomy) 12/22/2015  . Cystocele 12/21/2015  . Colon polyp 10/07/2015  . Bladder cystocele 10/07/2015  . Lower esophageal ring 10/07/2015  . H/O neoplasm 08/17/2015  . Malignant neoplasm of breast ([place]) 04/01/2014  . Acid reflux 04/01/2014  . Big thyroid 04/01/2014  . HLD (hyperlipidemia) 04/01/2014  . OP (osteoporosis) 04/01/2014  . Temporary cerebral vascular dysfunction 04/01/2014  . H/O gastrointestinal disease 02/28/2014    Past Surgical  History:  Procedure Laterality Date  . APPENDECTOMY    . BREAST BIOPSY Right 2006   breast cancer  . BREAST SURGERY Right   . DILATION AND CURETTAGE OF UTERUS      Prior to Admission medications   Medication Sig Start Date End Date Taking? Authorizing Provider  apixaban (ELIQUIS) 2.5 MG TABS tablet Take 1 tablet (2.5 mg total) by mouth 2 (two) times daily. 05/31/17   Demetrios Loll, MD  aspirin 81 MG chewable tablet Chew by mouth daily.    [provider]  calcium carbonate (OSCAL) 1500 (600 CA) MG TABS tablet Take by mouth 2 (two) times daily with a meal.    [provider]  diltiazem (CARDIZEM CD) 120 MG 24 hr capsule Take 1 capsule (120 mg total) by mouth daily. 06/01/17   Demetrios Loll, MD  escitalopram (LEXAPRO) 10 MG tablet Take 10 mg by mouth.    [provider]  levothyroxine (SYNTHROID, LEVOTHROID) 50 MCG tablet Take 50 mcg by mouth daily before breakfast.    [provider]  omeprazole (PRILOSEC) 20 MG capsule Take 20 mg by mouth daily.    [provider]    Allergies Iodinated diagnostic agents; Codeine; Phenobarbital; and Penicillin v potassium  Family History  Problem Relation Age of Onset  . Diabetes Sister   . Breast cancer Sister        late 60's  . Diabetes Brother     Social History Social History  Tobacco Use  . Smoking status: Never Smoker  . Smokeless tobacco: Never Used  Substance Use Topics  . Alcohol use: No  . Drug use: No    Review of Systems  Constitutional: Negative for fever. Eyes: Negative for visual changes. ENT: Negative for sore throat. Cardiovascular: Negative for chest pain. Respiratory: Negative for shortness of breath. Gastrointestinal: Negative for abdominal pain, vomiting and diarrhea. Genitourinary: Negative for dysuria. Musculoskeletal: Positive for back and right buttock pain. Skin: Negative for rash. Neurological: Negative for headaches, focal weakness or  numbness. ____________________________________________  PHYSICAL EXAM:  VITAL SIGNS: ED Triage Vitals  Enc Vitals Group     BP 08/25/17 1306 (!) 175/46     Pulse Rate 08/25/17 1306 68     Resp 08/25/17 1306 16     Temp 08/25/17 1306 97.8 F (36.6 C)     Temp Source 08/25/17 1306 Oral     SpO2 08/25/17 1306 97 %     Weight 08/25/17 1302 125 lb (56.7 kg)     Height 08/25/17 1302 5' (1.524 m)     Head Circumference --      Peak Flow --      Pain Score 08/25/17 1302 4     Pain Loc --      Pain Edu? --      Excl. in [place]? --     Constitutional: Alert and oriented. Well appearing and in no distress. Head: Normocephalic and atraumatic. Eyes: Conjunctivae are normal. Normal extraocular movements Neck: Supple. No thyromegaly. Normal ROM without crepitus Cardiovascular: Normal rate, regular rhythm. Normal distal pulses. Respiratory: Normal respiratory effort. No wheezes/rales/rhonchi. Gastrointestinal: Soft and nontender. No distention. Musculoskeletal: Normal spinal alignment without midline tenderness, spasm, deformity, or step-off.  Patient transitions from sit to stand without assistance.  She is with some tenderness to palpation over the right but talks at the issue ramus.  Early ecchymosis is noted in the same region.  She is able to demonstrate full lumbar flexion and in extension range.  Left elbow without any obvious deformity, there is some early ecchymosis noted to the anterior forearm.  Nontender with normal range of motion in all extremities.  Neurologic: Cranial nerves II through XII grossly intact.  Normal gait without ataxia. Normal speech and language. No gross focal neurologic deficits are appreciated. Skin:  Skin is warm, dry and intact. No rash noted. Early bruising noted to the right buttock at the ischia ramus.  ____________________________________________   RADIOLOGY  Right Rib Detail/CXR IMPRESSION: No evidence for acute  abnormality.  Lumbar  Spine  IMPRESSION: No evidence for acute  abnormality. ____________________________________________  INITIAL IMPRESSION / ASSESSMENT AND PLAN / ED COURSE  Geriatric patient with ED evaluation of injury sustained following a mechanical fall at home.  Patient presents to the ED with pain to the right lower back, mid back, and buttocks.  She is reassured by her negative x-ray findings.  Her exam is overall benign.  She is inclined to dose over-the-counter Tylenol for any pain relief.  She will follow-up with Dr. Doy Hutching or return to the ED as needed. ____________________________________________  FINAL CLINICAL IMPRESSION(S) / ED DIAGNOSES  Final diagnoses:  Fall in home, initial encounter  Contusion of buttock, initial encounter  Thoracic myofascial strain, initial encounter      Melvenia Needles, PA-C 08/25/17 1549    Carrie Mew, MD 08/25/17 2106

## 2017-08-25 NOTE — ED Triage Notes (Signed)
Pt slipped and fell on buttocks today.  No pain at times. Pain worse when sitting.  Pain is to right buttocks only.  Is on eliquis; no bruising or swelling noted. Did not hit head.  No abrasion or laceration. VSS. Ambulatory without any difficulty.

## 2017-08-25 NOTE — Discharge Instructions (Signed)
Your exam and x-rays are negative for fracture or dislocation. Take Tylenol as needed for pain relief. Follow-up with your provider or return as needed. Apply warm compresses to any sore muscles. Be careful out there.

## 2017-10-16 ENCOUNTER — Emergency Department
Admission: EM | Admit: 2017-10-16 | Discharge: 2017-10-16 | Disposition: A | Discharge status: Home / Self Care | Payer: Medicare Other | Attending: Emergency Medicine | Admitting: Emergency Medicine

## 2017-10-16 ENCOUNTER — Encounter: Payer: Self-pay | Admitting: Emergency Medicine

## 2017-10-16 ENCOUNTER — Emergency Department: Payer: Medicare Other

## 2017-10-16 ENCOUNTER — Other Ambulatory Visit: Payer: Self-pay

## 2017-10-16 DIAGNOSIS — Z7982 Long term (current) use of aspirin: Secondary | ICD-10-CM | POA: Diagnosis not present

## 2017-10-16 DIAGNOSIS — M25561 Pain in right knee: Secondary | ICD-10-CM

## 2017-10-16 DIAGNOSIS — W010XXA Fall on same level from slipping, tripping and stumbling without subsequent striking against object, initial encounter: Secondary | ICD-10-CM | POA: Diagnosis not present

## 2017-10-16 DIAGNOSIS — Y929 Unspecified place or not applicable: Secondary | ICD-10-CM | POA: Diagnosis not present

## 2017-10-16 DIAGNOSIS — Z7901 Long term (current) use of anticoagulants: Secondary | ICD-10-CM | POA: Diagnosis not present

## 2017-10-16 DIAGNOSIS — Z79899 Other long term (current) drug therapy: Secondary | ICD-10-CM | POA: Diagnosis not present

## 2017-10-16 DIAGNOSIS — S8991XA Unspecified injury of right lower leg, initial encounter: Secondary | ICD-10-CM | POA: Diagnosis not present

## 2017-10-16 DIAGNOSIS — M1711 Unilateral primary osteoarthritis, right knee: Secondary | ICD-10-CM | POA: Diagnosis not present

## 2017-10-16 DIAGNOSIS — Y999 Unspecified external cause status: Secondary | ICD-10-CM | POA: Insufficient documentation

## 2017-10-16 DIAGNOSIS — Y9301 Activity, walking, marching and hiking: Secondary | ICD-10-CM | POA: Diagnosis not present

## 2017-10-16 NOTE — ED Provider Notes (Signed)
[facility] Emergency Department Provider Note  ____________________________________________  Time seen: Approximately 4:55 PM  I have reviewed the triage vital signs and the nursing notes.   HISTORY  Chief Complaint Knee Injury    HPI Sheryl Michael is a 83 y.o. female that presents to the emergency department for evaluation of right knee pain for 3 weeks.  Patient states that she fell 3 weeks ago on ice.  She fell again last Sunday on a wet stepping stone.  When she fell, her right leg straightened in her left leg bent.  She has been walking normally since.  Pain is worse with weightbearing.  She does not have any pain while sitting or laying.  Pain is primarily in her knee but occasionally radiates into the top of her chin.  No alleviating measures have been attempted.  She does not smoke.  She is on Eliquis.  No calf pain, numbness, tingling.   Past Medical History:  Diagnosis Date  . Breast cancer ([place]) 2006   right breast ca with lumpectomy and rad tx  . Colon polyp   . Cystocele   . Depression   . Female bladder prolapse   . Goiter   . Hyperlipemia   . Procidentia of uterus   . Recurrent UTI   . Reflux   . TIA (transient ischemic attack)   . Vaginal atrophy     Patient Active Problem List   Diagnosis Date Noted  . Arrhythmia 05/31/2017  . Atrial fibrillation with RVR ([place]) 05/30/2017  . Dyspnea 05/30/2017  . Shingles 12/29/2015  . S/P VH (vaginal hysterectomy) 12/22/2015  . Cystocele 12/21/2015  . Colon polyp 10/07/2015  . Bladder cystocele 10/07/2015  . Lower esophageal ring 10/07/2015  . H/O neoplasm 08/17/2015  . Malignant neoplasm of breast ([place]) 04/01/2014  . Acid reflux 04/01/2014  . Big thyroid 04/01/2014  . HLD (hyperlipidemia) 04/01/2014  . OP (osteoporosis) 04/01/2014  . Temporary cerebral vascular dysfunction 04/01/2014  . H/O gastrointestinal disease 02/28/2014    Past Surgical History:  Procedure Laterality Date  .  APPENDECTOMY    . BREAST BIOPSY Right 2006   breast cancer  . BREAST SURGERY Right   . CYSTOCELE REPAIR N/A 12/21/2015   Procedure: ANTERIOR REPAIR (CYSTOCELE);  Surgeon: Brayton Mars, MD;  Location: [facility] ORS;  Service: Gynecology;  Laterality: N/A;  . DILATION AND CURETTAGE OF UTERUS    . VAGINAL HYSTERECTOMY Bilateral 12/21/2015   Procedure: TVH BSO;  Surgeon: Brayton Mars, MD;  Location: [facility] ORS;  Service: Gynecology;  Laterality: Bilateral;    Prior to Admission medications   Medication Sig Start Date End Date Taking? Authorizing Provider  apixaban (ELIQUIS) 2.5 MG TABS tablet Take 1 tablet (2.5 mg total) by mouth 2 (two) times daily. 05/31/17   Demetrios Loll, MD  aspirin 81 MG chewable tablet Chew by mouth daily.    [provider]  calcium carbonate (OSCAL) 1500 (600 CA) MG TABS tablet Take by mouth 2 (two) times daily with a meal.    [provider]  diltiazem (CARDIZEM CD) 120 MG 24 hr capsule Take 1 capsule (120 mg total) by mouth daily. 06/01/17   Demetrios Loll, MD  escitalopram (LEXAPRO) 10 MG tablet Take 10 mg by mouth.    [provider]  levothyroxine (SYNTHROID, LEVOTHROID) 50 MCG tablet Take 50 mcg by mouth daily before breakfast.    [provider]  omeprazole (PRILOSEC) 20 MG capsule Take 20 mg by mouth daily.  [provider]    Allergies Iodinated diagnostic agents; Codeine; Phenobarbital; and Penicillin v potassium  Family History  Problem Relation Age of Onset  . Diabetes Sister   . Breast cancer Sister        late 71's  . Diabetes Brother     Social History Social History   Tobacco Use  . Smoking status: Never Smoker  . Smokeless tobacco: Never Used  Substance Use Topics  . Alcohol use: No  . Drug use: No     Review of Systems  Constitutional: No fever/chills Cardiovascular: No chest pain. Respiratory:  No SOB. Gastrointestinal: No abdominal pain.  No nausea, no vomiting.  Musculoskeletal:  Positive for knee pain. Skin: Negative for rash, abrasions, lacerations, ecchymosis. Neurological: Negative for headaches, numbness or tingling   ____________________________________________   PHYSICAL EXAM:  VITAL SIGNS: ED Triage Vitals  Enc Vitals Group     BP 10/16/17 1529 (!) 148/50     Pulse Rate 10/16/17 1529 60     Resp 10/16/17 1529 16     Temp 10/16/17 1529 98 F (36.7 C)     Temp Source 10/16/17 1529 Oral     SpO2 10/16/17 1529 98 %     Weight 10/16/17 1530 129 lb (58.5 kg)     Height 10/16/17 1530 5\' 1"  (1.549 m)     Head Circumference --      Peak Flow --      Pain Score 10/16/17 1535 0     Pain Loc --      Pain Edu? --      Excl. in [place]? --      Constitutional: Alert and oriented. Well appearing and in no acute distress. Eyes: Conjunctivae are normal. PERRL. EOMI. Head: Atraumatic. ENT:      Ears:      Nose: No congestion/rhinnorhea.      Mouth/Throat: Mucous membranes are moist.  Neck: No stridor.  Cardiovascular: Normal rate, regular rhythm.  Good peripheral circulation.  Symmetric dorsalis pedis pulses bilaterally. Respiratory: Normal respiratory effort without tachypnea or retractions. Lungs CTAB. Good air entry to the bases with no decreased or absent breath sounds. Musculoskeletal: Full range of motion to all extremities. No gross deformities appreciated.  Full range of motion of knee.  Nontender to palpation. Neurologic:  Normal speech and language. No gross focal neurologic deficits are appreciated.  Skin:  Skin is warm, dry and intact. No rash noted.   ____________________________________________   LABS (all labs ordered are listed, but only abnormal results are displayed)  Labs Reviewed - No data to display ____________________________________________  EKG   ____________________________________________  RADIOLOGY Robinette Haines, personally viewed and evaluated these images (plain radiographs) as part of my medical decision making,  as well as reviewing the written report by the radiologist.  Dg Knee Complete 4 Views Right  Result Date: 10/16/2017 CLINICAL DATA:  Right knee pain after 2 recent falls. EXAM: RIGHT KNEE - COMPLETE 4+ VIEW COMPARISON:  None. FINDINGS: No acute fracture or malalignment. No joint effusion. Mild lateral compartment joint space narrowing. Faint chondrocalcinosis. Osteopenia. Soft tissues are unremarkable. IMPRESSION: No acute osseous abnormality. Mild lateral compartment osteoarthritis. Electronically Signed   By: Titus Dubin M.D.   On: 10/16/2017 16:10    ____________________________________________    PROCEDURES  Procedure(s) performed:    Procedures    Medications - No data to display   ____________________________________________   INITIAL IMPRESSION / ASSESSMENT AND PLAN / ED COURSE  Pertinent labs & imaging results  that were available during my care of the patient were reviewed by me and considered in my medical decision making (see chart for details).  Review of the [place] CSRS was performed in accordance of the [place] prior to dispensing any controlled drugs.   Patient's diagnosis is consistent with osteoarthritis, right knee injury, right knee pain. Vital signs and exam are reassuring. Xray consistent with osteoarthritis. She has a knee brace and walker at home that she will use for assistance with ambulation. Patient is to follow up with PCP as directed. Patient is given ED precautions to return to the ED for any worsening or new symptoms.     ____________________________________________  FINAL CLINICAL IMPRESSION(S) / ED DIAGNOSES  Final diagnoses:  Right knee pain, unspecified chronicity  Injury of right knee, initial encounter  Osteoarthritis of right knee, unspecified osteoarthritis type      NEW MEDICATIONS STARTED DURING THIS VISIT:  ED Discharge Orders    None          This chart was dictated using voice recognition software/Dragon. Despite  best efforts to proofread, errors can occur which can change the meaning. Any change was purely unintentional.    Laban Emperor, PA-C 10/16/17 1746    Darel Hong, MD 10/16/17 1929

## 2017-10-16 NOTE — ED Notes (Signed)
See triage note  States she fell about 3 weeks ago and then fell again last Sunday   conts to have pain to right knee and lower leg

## 2017-10-16 NOTE — ED Triage Notes (Signed)
Pt fell a few weeks ago on ice.  Thinks the right knee was injured then, but then fell again last Sunday a week ago slipped on wet stepping stone. She can bend knee, but she cant straighten.  Hurts to weight bear.

## 2017-10-20 ENCOUNTER — Other Ambulatory Visit: Payer: Self-pay | Admitting: Internal Medicine

## 2017-10-20 DIAGNOSIS — M25561 Pain in right knee: Secondary | ICD-10-CM

## 2017-10-30 ENCOUNTER — Ambulatory Visit
Admission: RE | Admit: 2017-10-30 | Discharge: 2017-10-30 | Disposition: A | Discharge status: Home / Self Care | Payer: Medicare Other | Source: Ambulatory Visit | Attending: Internal Medicine | Admitting: Internal Medicine

## 2017-10-30 DIAGNOSIS — M7121 Synovial cyst of popliteal space [Baker], right knee: Secondary | ICD-10-CM | POA: Diagnosis not present

## 2017-10-30 DIAGNOSIS — X58XXXA Exposure to other specified factors, initial encounter: Secondary | ICD-10-CM | POA: Insufficient documentation

## 2017-10-30 DIAGNOSIS — M25561 Pain in right knee: Secondary | ICD-10-CM | POA: Diagnosis not present

## 2017-10-30 DIAGNOSIS — M23341 Other meniscus derangements, anterior horn of lateral meniscus, right knee: Secondary | ICD-10-CM | POA: Insufficient documentation

## 2017-10-30 DIAGNOSIS — M1711 Unilateral primary osteoarthritis, right knee: Secondary | ICD-10-CM | POA: Diagnosis not present

## 2017-10-30 DIAGNOSIS — S83271A Complex tear of lateral meniscus, current injury, right knee, initial encounter: Secondary | ICD-10-CM | POA: Insufficient documentation

## 2017-11-01 ENCOUNTER — Other Ambulatory Visit: Payer: Self-pay | Admitting: Internal Medicine

## 2017-11-01 DIAGNOSIS — Z1231 Encounter for screening mammogram for malignant neoplasm of breast: Secondary | ICD-10-CM

## 2017-11-29 ENCOUNTER — Ambulatory Visit
Admission: RE | Admit: 2017-11-29 | Discharge: 2017-11-29 | Disposition: A | Discharge status: Home / Self Care | Payer: Medicare Other | Source: Ambulatory Visit | Attending: Internal Medicine | Admitting: Internal Medicine

## 2017-11-29 DIAGNOSIS — Z1231 Encounter for screening mammogram for malignant neoplasm of breast: Secondary | ICD-10-CM | POA: Diagnosis present

## 2018-03-12 DIAGNOSIS — Z1211 Encounter for screening for malignant neoplasm of colon: Secondary | ICD-10-CM | POA: Insufficient documentation

## 2018-04-04 ENCOUNTER — Other Ambulatory Visit: Payer: Self-pay | Admitting: Internal Medicine

## 2018-04-04 DIAGNOSIS — R053 Chronic cough: Secondary | ICD-10-CM

## 2018-04-04 DIAGNOSIS — R05 Cough: Secondary | ICD-10-CM

## 2018-04-04 DIAGNOSIS — R0609 Other forms of dyspnea: Secondary | ICD-10-CM

## 2018-04-11 ENCOUNTER — Ambulatory Visit
Admission: RE | Admit: 2018-04-11 | Discharge: 2018-04-11 | Disposition: A | Discharge status: Home / Self Care | Payer: Medicare Other | Source: Ambulatory Visit | Attending: Internal Medicine | Admitting: Internal Medicine

## 2018-04-11 DIAGNOSIS — X58XXXA Exposure to other specified factors, initial encounter: Secondary | ICD-10-CM | POA: Diagnosis not present

## 2018-04-11 DIAGNOSIS — R05 Cough: Secondary | ICD-10-CM | POA: Diagnosis present

## 2018-04-11 DIAGNOSIS — R053 Chronic cough: Secondary | ICD-10-CM

## 2018-04-11 DIAGNOSIS — R0609 Other forms of dyspnea: Secondary | ICD-10-CM | POA: Insufficient documentation

## 2018-04-11 DIAGNOSIS — I700 Atherosclerosis of aorta: Secondary | ICD-10-CM | POA: Diagnosis not present

## 2018-04-11 DIAGNOSIS — K449 Diaphragmatic hernia without obstruction or gangrene: Secondary | ICD-10-CM | POA: Insufficient documentation

## 2018-04-11 DIAGNOSIS — S2232XA Fracture of one rib, left side, initial encounter for closed fracture: Secondary | ICD-10-CM | POA: Insufficient documentation

## 2018-04-11 DIAGNOSIS — R911 Solitary pulmonary nodule: Secondary | ICD-10-CM | POA: Insufficient documentation

## 2018-07-16 DIAGNOSIS — S46012A Strain of muscle(s) and tendon(s) of the rotator cuff of left shoulder, initial encounter: Secondary | ICD-10-CM | POA: Insufficient documentation

## 2018-07-16 DIAGNOSIS — S76012A Strain of muscle, fascia and tendon of left hip, initial encounter: Secondary | ICD-10-CM | POA: Insufficient documentation

## 2018-07-30 DIAGNOSIS — S63634A Sprain of interphalangeal joint of right ring finger, initial encounter: Secondary | ICD-10-CM | POA: Insufficient documentation

## 2018-08-27 ENCOUNTER — Encounter: Payer: Self-pay | Admitting: Cardiothoracic Surgery

## 2018-08-27 ENCOUNTER — Ambulatory Visit
Admission: RE | Admit: 2018-08-27 | Discharge: 2018-08-27 | Disposition: A | Discharge status: Home / Self Care | Payer: Self-pay | Source: Ambulatory Visit | Attending: Cardiothoracic Surgery | Admitting: Cardiothoracic Surgery

## 2018-08-27 ENCOUNTER — Other Ambulatory Visit: Payer: Self-pay

## 2018-08-27 ENCOUNTER — Ambulatory Visit (INDEPENDENT_AMBULATORY_CARE_PROVIDER_SITE_OTHER): Payer: Medicare Other | Admitting: Cardiothoracic Surgery

## 2018-08-27 ENCOUNTER — Other Ambulatory Visit: Payer: Self-pay | Admitting: Cardiothoracic Surgery

## 2018-08-27 VITALS — BP 199/66 | HR 56 | Temp 97.7°F | Resp 16 | Ht 61.0 in | Wt 129.4 lb

## 2018-08-27 DIAGNOSIS — R918 Other nonspecific abnormal finding of lung field: Secondary | ICD-10-CM

## 2018-08-27 NOTE — Patient Instructions (Addendum)
We will have the disc digitized into our system.  We will see you back  08/31/18 to discuss your recent images.

## 2018-08-27 NOTE — Progress Notes (Signed)
Patient ID: Sheryl Michael, female   DOB: [birth year], 84 y.o.   MRN: [id number]  Chief Complaint  Patient presents with  . New Patient (Initial Visit)    [place] patient- pulmonary nodules    Referred By Dr. Fulton Reek Reason for Referral multiple pulmonary nodules  HPI Location, Quality, Duration, Severity, Timing, Context, Modifying Factors, Associated Signs and Symptoms.  Sheryl Michael is a 84 y.o. female.  She has a past medical history significant for breast cancer treated with surgery and radiation therapy about 10 years ago.  She has had no evidence of recurrence since that time.  The patient's family states that she fell last weekend and had some pain on the right side of her chest.  Shortly thereafter she developed a rash which was actually shingles and a chest x-ray was made.  This did not reveal any evidence of rib fractures but did show some pulmonary nodules.  A subsequent CT scan was performed which revealed multiple pulmonary nodules in the right upper lobe.  The patient has had a left lower lobe pulmonary nodule which is been stable for the last 5 years and was considered benign.  Unfortunately I was unable to pull up the x-rays from the outside facility today and these will need to be digitized.  The patient states that she has had no fevers, chills or cough.  She is a non-smoker.  She does get annual mammograms and breast exams.  There is no family history of lung cancer.   Past Medical History:  Diagnosis Date  . Breast cancer ([place]) 2006   right breast ca with lumpectomy and rad tx  . Colon polyp   . Cystocele   . Depression   . Female bladder prolapse   . Goiter   . Hyperlipemia   . Procidentia of uterus   . Recurrent UTI   . Reflux   . TIA (transient ischemic attack)   . Vaginal atrophy     Past Surgical History:  Procedure Laterality Date  . APPENDECTOMY    . BREAST BIOPSY Right 2006   breast cancer  . BREAST SURGERY Right   . CYSTOCELE REPAIR N/A 12/21/2015    Procedure: ANTERIOR REPAIR (CYSTOCELE);  Surgeon: Brayton Mars, MD;  Location: [facility] ORS;  Service: Gynecology;  Laterality: N/A;  . DILATION AND CURETTAGE OF UTERUS    . VAGINAL HYSTERECTOMY Bilateral 12/21/2015   Procedure: TVH BSO;  Surgeon: Brayton Mars, MD;  Location: [facility] ORS;  Service: Gynecology;  Laterality: Bilateral;    Family History  Problem Relation Age of Onset  . Diabetes Sister   . Breast cancer Sister        late 59's  . Diabetes Brother   . Diabetes Sister     Social History Social History   Tobacco Use  . Smoking status: Never Smoker  . Smokeless tobacco: Never Used  Substance Use Topics  . Alcohol use: No  . Drug use: No    Allergies  Allergen Reactions  . Iodinated Diagnostic Agents Anaphylaxis  . Codeine Nausea And Vomiting  . Erythromycin Ethylsuccinate Other (See Comments)    Unknown   . Phenobarbital Other (See Comments)  . Penicillin V Potassium Itching and Rash    Has patient had a PCN reaction causing immediate rash, facial/tongue/throat swelling, SOB or lightheadedness with hypotension: No Has patient had a PCN reaction causing severe rash involving mucus membranes or skin necrosis: No Has patient had a PCN reaction that required hospitalization: No  Has patient had a PCN reaction occurring within the last 10 years: No If all of the above answers are "NO", then may proceed with Cephalosporin use.     Current Outpatient Medications  Medication Sig Dispense Refill  . atorvastatin (LIPITOR) 10 MG tablet Take by mouth.    . diltiazem (CARDIZEM CD) 120 MG 24 hr capsule Take 1 capsule (120 mg total) by mouth daily. 30 capsule 0  . valACYclovir (VALTREX) 1000 MG tablet Take by mouth.    Marland Kitchen apixaban (ELIQUIS) 2.5 MG TABS tablet Take 1 tablet (2.5 mg total) by mouth 2 (two) times daily. 60 tablet 0  . escitalopram (LEXAPRO) 10 MG tablet Take 10 mg by mouth.    . levothyroxine (SYNTHROID, LEVOTHROID) 50 MCG tablet Take 50 mcg by  mouth daily before breakfast.    . omeprazole (PRILOSEC OTC) 20 MG tablet Take by mouth.    . predniSONE (DELTASONE) 10 MG tablet   0   No current facility-administered medications for this visit.       Review of Systems A complete review of systems was asked and was negative except for the following positive findings thyroid nodule, lung nodule, colon polyps, shingles, arthritis, glasses.  Blood pressure (!) 199/66, pulse (!) 56, temperature 97.7 F (36.5 C), temperature source Temporal, resp. rate 16, height 5\' 1"  (1.549 m), weight 129 lb 6.4 oz (58.7 kg), SpO2 99 %.  Physical Exam CONSTITUTIONAL:  Pleasant, well-developed, well-nourished, and in no acute distress. EYES: Pupils equal and reactive to light, Sclera non-icteric EARS, NOSE, MOUTH AND THROAT:  The oropharynx was clear.  Dentition is good repair.  Oral mucosa pink and moist. LYMPH NODES:  Lymph nodes in the neck and axillae were normal RESPIRATORY:  Lungs were clear.  Normal respiratory effort without pathologic use of accessory muscles of respiration CARDIOVASCULAR: Heart was regular without murmurs.  There were no carotid bruits. GI: The abdomen was soft, nontender, and nondistended. There were no palpable masses. There was no hepatosplenomegaly. There were normal bowel sounds in all quadrants. GU:  Rectal deferred.   MUSCULOSKELETAL:  Normal muscle strength and tone.  No clubbing or cyanosis.   SKIN:  There were no pathologic skin lesions.  There were no nodules on palpation.  There is an obvious dermatomal rash extending from the midline posteriorly around the right chest and over the breast.  This is consistent with shingles.  They are crusted. NEUROLOGIC:  Sensation is normal.  Cranial nerves are grossly intact. PSYCH:  Oriented to person, place and time.  Mood and affect are normal.  Data Reviewed Multiple CT scans the most recent of which was in June 2019.  I have personally reviewed the patient's imaging,  laboratory findings and medical records.    Assessment    Unfortunately I was unable to pull up the x-rays from the outside facility.  However the patient's daughter who is intimately knowledgeable about the results say that there is multiple pulmonary nodules in the right upper lobe consistent with a malignancy.      Plan    I will have the x-rays digitized.  Once they are digitize I will review those and I will have the patient come back to see me in 1 week.  All questions were answered at this point.       Nestor Lewandowsky, MD 08/27/2018, 8:54 AM

## 2018-08-29 ENCOUNTER — Other Ambulatory Visit: Payer: Self-pay | Admitting: Cardiothoracic Surgery

## 2018-08-29 ENCOUNTER — Ambulatory Visit
Admission: RE | Admit: 2018-08-29 | Discharge: 2018-08-29 | Disposition: A | Discharge status: Home / Self Care | Payer: Self-pay | Source: Ambulatory Visit | Attending: Cardiothoracic Surgery | Admitting: Cardiothoracic Surgery

## 2018-08-29 DIAGNOSIS — R918 Other nonspecific abnormal finding of lung field: Secondary | ICD-10-CM

## 2018-08-31 ENCOUNTER — Telehealth: Payer: Self-pay

## 2018-08-31 ENCOUNTER — Other Ambulatory Visit: Payer: Self-pay

## 2018-08-31 ENCOUNTER — Ambulatory Visit (INDEPENDENT_AMBULATORY_CARE_PROVIDER_SITE_OTHER): Payer: Medicare Other | Admitting: Cardiothoracic Surgery

## 2018-08-31 ENCOUNTER — Encounter: Payer: Self-pay | Admitting: Cardiothoracic Surgery

## 2018-08-31 VITALS — BP 155/64 | HR 71 | Temp 97.7°F | Resp 18 | Ht 60.0 in | Wt 129.8 lb

## 2018-08-31 DIAGNOSIS — R918 Other nonspecific abnormal finding of lung field: Secondary | ICD-10-CM | POA: Diagnosis not present

## 2018-08-31 DIAGNOSIS — E041 Nontoxic single thyroid nodule: Secondary | ICD-10-CM | POA: Diagnosis not present

## 2018-08-31 NOTE — Progress Notes (Signed)
  Patient ID: Sheryl Michael, female   DOB: [birth year], 84 y.o.   MRN: [id number]  HISTORY: She returns today in follow-up.  She was seen earlier this week but we are unable to have her x-rays seen.  They have not been digitized and I have been able to review those.  She also had an ultrasound of her thyroid.  She is scheduled to have a thyroid biopsy by her endocrinologist.   Vitals:   08/31/18 1005  BP: (!) 155/64  Pulse: 71  Resp: 18  Temp: 97.7 F (36.5 C)  SpO2: 97%     EXAM:    Resp: Lungs are clear bilaterally.  No respiratory distress, normal effort. Heart:  Regular without murmurs Abd:  Abdomen is soft, non distended and non tender. No masses are palpable.  There is no rebound and no guarding.  Neurological: Alert and oriented to person, place, and time. Coordination normal.  Skin: Skin is warm and dry. No rash noted. No diaphoretic. No erythema. No pallor.  Psychiatric: Normal mood and affect. Normal behavior. Judgment and thought content normal.    ASSESSMENT: I have reviewed the CT scan.  There is a collection of nodules in the right middle lobe which coalesced to form a dominant mass approximately 2 cm in size.  In retrospect back in June there is a tubular area which may represent mucoid impaction in a distal bronchial.  However a malignancy certainly has not been excluded.   PLAN:   I would like to obtain a PET scan and a complete set of pulmonary function studies.  The PET scan will also give [place] additional information regarding her thyroid nodule.  The patient is somewhat concerned that she has multiple physicians looking after and would like to focus on her lung mass at this time.  I think that is reasonable and I explained to her that the PET scan may be helpful in determining what the lung and/or thyroid nodule may be.  I will see her back again in 1 week.    Nestor Lewandowsky, MD

## 2018-08-31 NOTE — Telephone Encounter (Signed)
Message left for the patient and the patient's daughter, Malachy Mood. The patient is scheduled for a Pulmonary Function test at [facility] on 09/05/18 at 8:30 am. She is to arrive there by 8:15 am and may not have any caffeine, may not smoke, or may not use any Bronchodilator inhalers that morning.  She is also scheduled for a PET scan at [facility] on 09/06/18 at 8:30 am. She is to arrive there by 8:00 am and may have nothing by mouth after midnight the night prior. She will need to be seen by Dr Genevive Bi after these tests and can come in on 09/07/18.

## 2018-08-31 NOTE — Patient Instructions (Signed)
We will see you back after you have the Pulmonary Function test and the PET scan.

## 2018-09-03 NOTE — Telephone Encounter (Signed)
Spoke with the patient's daughter, Malachy Mood and all information has been relayed to her.

## 2018-09-05 ENCOUNTER — Ambulatory Visit: Payer: Medicare Other | Attending: Cardiothoracic Surgery

## 2018-09-05 DIAGNOSIS — R918 Other nonspecific abnormal finding of lung field: Secondary | ICD-10-CM | POA: Insufficient documentation

## 2018-09-05 LAB — BLOOD GAS, ARTERIAL
Acid-Base Excess: 0.1 mmol/L (ref 0.0–2.0)
Bicarbonate: 23.6 mmol/L (ref 20.0–28.0)
FIO2: 0.21
O2 Saturation: 97.2 %
PH ART: 7.45 (ref 7.350–7.450)
PO2 ART: 89 mmHg (ref 83.0–108.0)
Patient temperature: 37
pCO2 arterial: 34 mmHg (ref 32.0–48.0)

## 2018-09-06 ENCOUNTER — Ambulatory Visit
Admission: RE | Admit: 2018-09-06 | Discharge: 2018-09-06 | Disposition: A | Discharge status: Home / Self Care | Payer: Medicare Other | Source: Ambulatory Visit | Attending: Cardiothoracic Surgery | Admitting: Cardiothoracic Surgery

## 2018-09-06 DIAGNOSIS — I700 Atherosclerosis of aorta: Secondary | ICD-10-CM | POA: Insufficient documentation

## 2018-09-06 DIAGNOSIS — R918 Other nonspecific abnormal finding of lung field: Secondary | ICD-10-CM | POA: Diagnosis not present

## 2018-09-06 DIAGNOSIS — E041 Nontoxic single thyroid nodule: Secondary | ICD-10-CM | POA: Diagnosis not present

## 2018-09-06 LAB — GLUCOSE, CAPILLARY: Glucose-Capillary: 87 mg/dL (ref 70–99)

## 2018-09-06 MED ORDER — FLUDEOXYGLUCOSE F - 18 (FDG) INJECTION
6.7000 | Freq: Once | INTRAVENOUS | Status: AC | PRN
  Administered 2018-09-06: 6.68 via INTRAVENOUS

## 2018-09-07 ENCOUNTER — Other Ambulatory Visit: Payer: Self-pay

## 2018-09-07 ENCOUNTER — Encounter: Payer: Self-pay | Admitting: Cardiothoracic Surgery

## 2018-09-07 ENCOUNTER — Ambulatory Visit (INDEPENDENT_AMBULATORY_CARE_PROVIDER_SITE_OTHER): Payer: Medicare Other | Admitting: Cardiothoracic Surgery

## 2018-09-07 DIAGNOSIS — R918 Other nonspecific abnormal finding of lung field: Secondary | ICD-10-CM

## 2018-09-07 MED ORDER — CIPROFLOXACIN HCL 500 MG PO TABS: 500.0000 mg | ORAL_TABLET | Freq: Two times a day (BID) | ORAL | 0 refills | Status: AC

## 2018-09-07 NOTE — Patient Instructions (Addendum)
Please pick up your Antibiotic at the pharmacy. We will schedule an appointment with [facility] Pulmonary medicine.   We will follow up with you after you see Pulmonary medicine.

## 2018-09-07 NOTE — Progress Notes (Signed)
  Patient ID: Sheryl Michael, female   DOB: [birth year], 84 y.o.   MRN: [id number]  HISTORY: She returns today in follow-up.  She denies any fevers or chills.  She did describe to me an episode about a month ago where she had some ulcers in her mouth and was treated with an unknown antibiotic which caused some swelling on the right side of her neck.  This only lasted for short time and then she had the shingles.  Whether or not the 2 are related is unclear.  In any event she had a PET scan made yesterday which I have reviewed with the patient and her family.   There were no vitals filed for this visit.   EXAM:    Resp: Lungs are clear bilaterally.  No respiratory distress, normal effort. Heart:  Regular without murmurs Abd:  Abdomen is soft, non distended and non tender. No masses are palpable.  There is no rebound and no guarding.  Neurological: Alert and oriented to person, place, and time. Coordination normal.  Skin: Skin is warm and dry. No rash noted. No diaphoretic. No erythema. No pallor.  Psychiatric: Normal mood and affect. Normal behavior. Judgment and thought content normal.    ASSESSMENT: The PET scan shows a 3 to 4 cm right middle lobe mass.  Upon further inspection the right middle lobe did have an endobronchial tumor that was present back in 2018 and is unchanged compared to the CT scan on June 2019.  Therefore I think that this is most likely to represent an endobronchial tumor with postobstructive pneumonia.  I discussed this finding with the radiologist Dr. Kerby Moors who agrees that in retrospect there was an endobronchial component and they will modify their radiology reports to reflect this.   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  I will give the patient 10 days worth of oral Cipro as she is allergic to erythromycin and penicillins.  I will refer her for an endobronchial biopsy by way of navigational bronchoscopy.  I will follow-up  with her after that.    Nestor Lewandowsky, MD

## 2018-09-10 ENCOUNTER — Other Ambulatory Visit: Payer: Self-pay

## 2018-09-10 ENCOUNTER — Ambulatory Visit (INDEPENDENT_AMBULATORY_CARE_PROVIDER_SITE_OTHER): Payer: Medicare Other | Admitting: Surgery

## 2018-09-10 ENCOUNTER — Encounter: Payer: Self-pay | Admitting: Surgery

## 2018-09-10 VITALS — BP 157/65 | HR 63 | Temp 97.9°F | Resp 16 | Ht 61.0 in | Wt 129.0 lb

## 2018-09-10 DIAGNOSIS — J181 Lobar pneumonia, unspecified organism: Secondary | ICD-10-CM | POA: Diagnosis not present

## 2018-09-10 DIAGNOSIS — J189 Pneumonia, unspecified organism: Secondary | ICD-10-CM

## 2018-09-10 MED ORDER — CEFPODOXIME PROXETIL 200 MG PO TABS: 200.0000 mg | ORAL_TABLET | Freq: Two times a day (BID) | ORAL | 0 refills | Status: DC

## 2018-09-10 MED ORDER — DOXYCYCLINE HYCLATE 100 MG PO CAPS: 100.0000 mg | ORAL_CAPSULE | Freq: Two times a day (BID) | ORAL | 0 refills | Status: DC

## 2018-09-10 NOTE — Progress Notes (Signed)
Outpatient Surgical Follow Up  09/10/2018  Sheryl Michael is an 84 y.o. female.   Chief Complaint  Patient presents with  . New Patient (Initial Visit)    Lung mass    HPI: Patient is an 84 year old female of Dr. Genevive Bi with a right middle lobe endobronchial mass with postobstructive pneumonia.  She was seen last week by Dr. Crist Infante and was prescribed Cipro.  She developed hives on the skin of her chest and lower extremity.  There is some itching.  Of note she is recently recovering from shingles as well within her chest wall. She denies any fevers any chills any chest pain.  No evidence of respiratory compromise.  No dyspnea. Recently reviewed the CT PET personally showing the evidence of an endobronchial mass in the right middle lobe.  Past Medical History:  Diagnosis Date  . Breast cancer ([place]) 2006   right breast ca with lumpectomy and rad tx  . Colon polyp   . Cystocele   . Depression   . Female bladder prolapse   . Goiter   . Hyperlipemia   . Procidentia of uterus   . Recurrent UTI   . Reflux   . TIA (transient ischemic attack)   . Vaginal atrophy     Past Surgical History:  Procedure Laterality Date  . APPENDECTOMY    . BREAST BIOPSY Right 2006   breast cancer  . BREAST SURGERY Right   . CYSTOCELE REPAIR N/A 12/21/2015   Procedure: ANTERIOR REPAIR (CYSTOCELE);  Surgeon: Brayton Mars, MD;  Location: [facility] ORS;  Service: Gynecology;  Laterality: N/A;  . DILATION AND CURETTAGE OF UTERUS    . VAGINAL HYSTERECTOMY Bilateral 12/21/2015   Procedure: TVH BSO;  Surgeon: Brayton Mars, MD;  Location: [facility] ORS;  Service: Gynecology;  Laterality: Bilateral;    Family History  Problem Relation Age of Onset  . Diabetes Sister   . Breast cancer Sister        late 53's  . Diabetes Brother   . Diabetes Sister     Social History:  reports that she has never smoked. She has never used smokeless tobacco. She reports that she does not drink alcohol or use  drugs.  Allergies:  Allergies  Allergen Reactions  . Iodinated Diagnostic Agents Anaphylaxis  . Codeine Nausea And Vomiting  . Erythromycin Ethylsuccinate Other (See Comments)    Unknown   . Phenobarbital Other (See Comments)  . Penicillin V Potassium Itching and Rash    Has patient had a PCN reaction causing immediate rash, facial/tongue/throat swelling, SOB or lightheadedness with hypotension: No Has patient had a PCN reaction causing severe rash involving mucus membranes or skin necrosis: No Has patient had a PCN reaction that required hospitalization: No Has patient had a PCN reaction occurring within the last 10 years: No If all of the above answers are "NO", then may proceed with Cephalosporin use.     Medications reviewed.    ROS Full ROS performed and is otherwise negative other than what is stated in HPI   BP (!) 157/65   Pulse 63   Temp 97.9 F (36.6 C) (Temporal)   Resp 16   Ht 5\' 1"  (1.549 m)   Wt 129 lb (58.5 kg)   SpO2 98%   BMI 24.37 kg/m   Physical Exam  Constitutional: She is oriented to person, place, and time. She appears well-developed and well-nourished.  Neck: Normal range of motion. No tracheal deviation present. No thyromegaly present.  Cardiovascular:  Normal rate and regular rhythm.  Pulmonary/Chest: Effort normal and breath sounds normal. No stridor. No respiratory distress.  Abdominal: Soft. She exhibits no distension. There is no tenderness. There is no guarding.  Musculoskeletal: Normal range of motion. She exhibits no edema.  Neurological: She is alert and oriented to person, place, and time. She displays normal reflexes. No cranial nerve deficit. She exhibits normal muscle tone. Coordination normal.  Skin: Skin is warm and dry. Capillary refill takes less than 2 seconds.  Psychiatric: She has a normal mood and affect. Her behavior is normal. Judgment and thought content normal.  Nursing note and vitals  reviewed.   Assessment/Plan: Post obstructive pneumonia in a 84 yo female with multiple med allegies. Mild skin allergic reaction to cipro. Change to 3rd gen cephalosporin and Doxi. F/W w Dr. Genevive Bi in 2 weeks. No need for hospitalization at this time.  Greater than 50% of the 25 minutes  visit was spent in counseling/coordination of care   Caroleen Hamman, MD [place] Surgeon

## 2018-09-10 NOTE — Patient Instructions (Addendum)
Please stop taking the Cipro.   Please pick up your new medications at the pharmacy today.  We will see you back in the office after your lung biopsy is done.

## 2018-09-18 ENCOUNTER — Ambulatory Visit: Payer: Medicare Other

## 2018-09-18 ENCOUNTER — Ambulatory Visit: Payer: Medicare Other | Admitting: Internal Medicine

## 2018-09-18 ENCOUNTER — Encounter: Payer: Self-pay | Admitting: Internal Medicine

## 2018-09-18 VITALS — BP 92/60 | HR 128 | Wt 131.0 lb

## 2018-09-18 DIAGNOSIS — R9389 Abnormal findings on diagnostic imaging of other specified body structures: Secondary | ICD-10-CM

## 2018-09-18 NOTE — Patient Instructions (Signed)
Discuss case with Dr. Genevive Bi  Plan for Cardiology Evaluation with Dr Clayborn Bigness prior to procedure   The Risks and Benefits of the Bronchoscopy procedure with Bronchoscopy  were explained to patient/family.  I have discussed the risk for Acute Bleeding, increased chance of Infection, increased chance of Respiratory Failure and Cardiac Arrest, increased chance of pneumothorax and collapsed lung, as well as increased Stroke and Death.  I have also explained to avoid all types of NSAIDs 1 week prior to procedure date  to decrease chance of bleeding, and also to avoid food and drinks the midnight prior to procedure.  The procedure consists of a video camera with a light source to be placed and inserted  into the lungs to  look for abnormal tissue and to obtain tissue samples by using needle and biopsy tools.  The patient/family understand the risks and benefits and have agreed to proceed with procedure.

## 2018-09-18 NOTE — Progress Notes (Signed)
Name: Sheryl Michael MRN: [id number] DOB: 06/24/1931     CONSULTATION DATE: 09/18/2018 REFERRING MD : Dr. Genevive Bi  CHIEF COMPLAINT: Abnormal CT chest  STUDIES:      08/27/2018   CT chest Independently reviewed by Me today There is a right middle lobe opacification Likely post inflammatory Tory process and infection   04/11/2018 CT chest independently reviewed by me today It seems to be an endobronchial lesion in the right middle lobe airway  CT chest 2014 There is a endobronchial narrowing of the right middle lobe airway   HISTORY OF PRESENT ILLNESS: 84 year old pleasant white female seen today for abnormal CT chest Patient has a history of A. fib on oral anticoagulation with Eliquis and over the last several months she has been falling a lot patient and it ended up getting several CT scans-Which led to diagnosis of incidental finding of a right middle lobe endobronchial lesion   the most recent CT chest was 08/27/18 Patient subsequently ended up getting a PET scan a week later 09/06/18 which showed interval growth and progression of the abnormality in RML  CT chest 04/11/2018 shows a endobronchial lesion which is narrowing the airway on the right middle lobe CT chest 2014 shows similar endobronchial lesion which is narrowing the airway on the right middle lobe   After review of CT scans over the last several years there is a CT scan in 2014 which shows an endobronchial lesion at that time which may correlate to a tortuous airway or just narrowing airway from fibrosis  On the most recent CT scan of her chest in November There is seems to be a postobstructive process and infection at the level of the right middle lobe  Patient was seen by Dr. Genevive Bi and was prescribed several antibiotics She denies having any productive cough or hemoptysis No shortness of breath no chest pain no palpitations  I have reviewed his CT scans with the patient and the family  Patient was sent to me  for evaluation for electromagnetic navigation bronchoscopy and airway examination   The Risks and Benefits of the Bronchoscopy procedure with ENB were explained to patient/family.  I have discussed the risk for Acute Bleeding, increased chance of Infection, increased chance of Respiratory Failure and Cardiac Arrest, increased chance of pneumothorax and collapsed lung, as well as increased Stroke and Death.  I have also explained to avoid all types of NSAIDs 1 week prior to procedure date  to decrease chance of bleeding, and also to avoid food and drinks the midnight prior to procedure.  The procedure consists of a video camera with a light source to be placed and inserted  into the lungs to  look for abnormal tissue and to obtain tissue samples by using needle and biopsy tools.  The patient/family understand the risks and benefits and have agreed to proceed with procedure.  Patient is on oral anticoagulation with Eliquis in the setting of A. fib Patient will have increased risk of stroke if she were to stop this medicine and I have explained this to the Patient she will need cardiology clearance prior to procedure   PAST MEDICAL HISTORY :   has a past medical history of Breast cancer ([place]) (2006), Colon polyp, Cystocele, Depression, Female bladder prolapse, Goiter, Hyperlipemia, Procidentia of uterus, Recurrent UTI, Reflux, TIA (transient ischemic attack), and Vaginal atrophy.  has a past surgical history that includes Dilation and curettage of uterus; Appendectomy; Breast surgery (Right); Vaginal hysterectomy (Bilateral, 12/21/2015); Cystocele repair (N/A,  12/21/2015); and Breast biopsy (Right, 2006). Prior to Admission medications   Medication Sig Start Date End Date Taking? Authorizing Provider  apixaban (ELIQUIS) 2.5 MG TABS tablet Take 1 tablet (2.5 mg total) by mouth 2 (two) times daily. 05/31/17  Yes Demetrios Loll, MD  atorvastatin (LIPITOR) 10 MG tablet Take by mouth. 07/04/12  Yes [provider]  cefpodoxime (VANTIN) 200 MG tablet Take 1 tablet (200 mg total) by mouth 2 (two) times daily. 09/10/18  Yes Pabon, Diego F, MD  diltiazem (CARDIZEM CD) 120 MG 24 hr capsule Take 1 capsule (120 mg total) by mouth daily. 06/01/17  Yes Demetrios Loll, MD  escitalopram (LEXAPRO) 10 MG tablet Take 10 mg by mouth.   Yes [provider]  levothyroxine (SYNTHROID, LEVOTHROID) 50 MCG tablet Take 50 mcg by mouth daily before breakfast.   Yes [provider]  omeprazole (PRILOSEC OTC) 20 MG tablet Take by mouth.   Yes [provider]   Allergies  Allergen Reactions  . Iodinated Diagnostic Agents Anaphylaxis  . Ciprofloxacin     Rash  . Codeine Nausea And Vomiting  . Erythromycin Ethylsuccinate Other (See Comments)    Unknown   . Phenobarbital Other (See Comments)  . Penicillin V Potassium Itching and Rash    Has patient had a PCN reaction causing immediate rash, facial/tongue/throat swelling, SOB or lightheadedness with hypotension: No Has patient had a PCN reaction causing severe rash involving mucus membranes or skin necrosis: No Has patient had a PCN reaction that required hospitalization: No Has patient had a PCN reaction occurring within the last 10 years: No If all of the above answers are "NO", then may proceed with Cephalosporin use.     FAMILY HISTORY:  family history includes Breast cancer in her sister; Diabetes in her brother, sister, and sister. SOCIAL HISTORY:  reports that she has never smoked. She has never used smokeless tobacco. She reports that she does not drink alcohol or use drugs.  REVIEW OF SYSTEMS:   Constitutional: Negative for fever, chills, weight loss, malaise/fatigue and diaphoresis.  HENT: Negative for hearing loss, ear pain, nosebleeds, congestion, sore throat, neck pain, tinnitus and ear discharge.   Eyes: Negative for blurred vision, double vision, photophobia, pain, discharge and redness.  Respiratory: Negative for  cough, hemoptysis, sputum production, shortness of breath, wheezing and stridor.   Cardiovascular: Negative for chest pain, palpitations, orthopnea, claudication, leg swelling and PND.  Gastrointestinal: Negative for heartburn, nausea, vomiting, abdominal pain, diarrhea, constipation, blood in stool and melena.  Genitourinary: Negative for dysuria, urgency, frequency, hematuria and flank pain.  Musculoskeletal: Negative for myalgias, back pain, joint pain and falls.  Skin: Negative for itching and rash.  Neurological: Negative for dizziness, tingling, tremors, sensory change, speech change, focal weakness, seizures, loss of consciousness, weakness and headaches.  Endo/Heme/Allergies: Negative for environmental allergies and polydipsia. Does not bruise/bleed easily.  ALL OTHER ROS ARE NEGATIVE   BP 92/60 (BP Location: Left Arm, Cuff Size: Normal)   Pulse (!) 128   Wt 131 lb (59.4 kg)   SpO2 97%   BMI 24.75 kg/m    Physical Examination:   GENERAL:NAD, no fevers, chills, no weakness no fatigue HEAD: Normocephalic, atraumatic.  EYES: Pupils equal, round, reactive to light. Extraocular muscles intact. No scleral icterus.  MOUTH: Moist mucosal membrane.   EAR, NOSE, THROAT: Clear without exudates. No external lesions.  NECK: Supple. No thyromegaly. No nodules. No JVD.  PULMONARY:CTA B/L no wheezes, no crackles, no rhonchi CARDIOVASCULAR: S1 and S2.  Regular rate and rhythm. No murmurs, rubs, or gallops. No edema.  GASTROINTESTINAL: Soft, nontender, nondistended. No masses. Positive bowel sounds.  MUSCULOSKELETAL: No swelling, clubbing, or edema. Range of motion full in all extremities.  NEUROLOGIC: Cranial nerves II through XII are intact. No gross focal neurological deficits.  SKIN: No ulceration, lesions, rashes, or cyanosis. Skin warm and dry. Turgor intact.  PSYCHIATRIC: Mood, affect within normal limits. The patient is awake, alert and oriented x 3. Insight, judgment intact.       ASSESSMENT / PLAN: 84 year old pleasant white female seen today for abnormal CT chest with a right middle lobe opacification which seems to be a postobstructive process of inflammation and infection related to a probable underlying endobronchial lesion which could be chronic in the setting of previous CT scans that show the same lesion  The possibility of malignancy is probable but less likely at this time since I have reviewed his CT scans over the last 5 years which showed this right middle lobe endobronchial lesion which could be related to airway fibrosis or tortuous airway  At this time I would recommend discussing case with Dr. Genevive Bi and patient needs to have a preop cardiology clearance and evaluation for her oral anticoagulation and then will proceed with planning for electromagnetic navigation bronchoscopy   Patient/Family are satisfied with Plan of action and management. All questions answered  Corrin Parker, M.D.  [facility]  Medical Director [place] Director [facility] Cardio-Pulmonary Department

## 2018-09-24 ENCOUNTER — Telehealth: Payer: Self-pay | Admitting: Internal Medicine

## 2018-09-24 NOTE — Telephone Encounter (Signed)
Patient waiting on an answer about scheduling biopsy .

## 2018-09-25 ENCOUNTER — Telehealth: Payer: Self-pay | Admitting: *Deleted

## 2018-09-25 ENCOUNTER — Telehealth: Payer: Self-pay | Admitting: Cardiothoracic Surgery

## 2018-09-25 NOTE — Telephone Encounter (Signed)
Patient advised.

## 2018-09-25 NOTE — Telephone Encounter (Signed)
Call from an unidentified caller wanting a return call to discuss patient follow up on her lungs and treatment plans

## 2018-09-25 NOTE — Telephone Encounter (Signed)
I discussed case with Dr Genevive Bi Will repeat CT chest in 3 months(Feb 2020)

## 2018-09-25 NOTE — Telephone Encounter (Signed)
Patient will follow up in three month with a Ct scan with Dr.Kasa per phone note.

## 2018-09-25 NOTE — Telephone Encounter (Signed)
Patient's daughter is calling and asking for Sheryl Michael to give her a call would like to know some information about her mother please call patient and advise. She can be reached at [phone]

## 2018-09-26 ENCOUNTER — Encounter: Payer: Self-pay | Admitting: *Deleted

## 2018-09-26 NOTE — Progress Notes (Signed)
Patient was referred to Dr. Mortimer Fries for a lung biopsy. She was placed in his recalls for a CT scan in 3 months.   Per Dr. Zoila Shutter note, he spoke with Dr. Genevive Bi.   Note to Dr. Genevive Bi to see if we need to put patient in recalls for March 2020 after patient has CT scan and follows up with Dr. Mortimer Fries.

## 2018-09-28 ENCOUNTER — Telehealth: Payer: Self-pay | Admitting: Internal Medicine

## 2018-09-28 ENCOUNTER — Other Ambulatory Visit: Payer: Self-pay | Admitting: *Deleted

## 2018-09-28 DIAGNOSIS — R918 Other nonspecific abnormal finding of lung field: Secondary | ICD-10-CM

## 2018-09-28 NOTE — Telephone Encounter (Signed)
Returned call to patient and explained CT chest will be done in 3 mos with a f/u appt back with Dr. Mortimer Fries.

## 2018-09-28 NOTE — Telephone Encounter (Signed)
Patient calling Would like to know a time frame of when to have her CT in February Her family is wanting to go somewhere around that time Please call to discuss

## 2018-10-25 NOTE — Progress Notes (Signed)
I should see her after Dr. Mortimer Fries sees her in March 2020.  No scans needed for my visit.

## 2018-11-07 ENCOUNTER — Ambulatory Visit
Admission: RE | Admit: 2018-11-07 | Discharge: 2018-11-07 | Disposition: A | Discharge status: Home / Self Care | Payer: Medicare Other | Source: Ambulatory Visit | Attending: Internal Medicine | Admitting: Internal Medicine

## 2018-11-07 DIAGNOSIS — R918 Other nonspecific abnormal finding of lung field: Secondary | ICD-10-CM | POA: Diagnosis present

## 2018-11-20 ENCOUNTER — Ambulatory Visit (INDEPENDENT_AMBULATORY_CARE_PROVIDER_SITE_OTHER): Payer: Medicare Other | Admitting: Internal Medicine

## 2018-11-20 ENCOUNTER — Encounter: Payer: Self-pay | Admitting: Internal Medicine

## 2018-11-20 VITALS — BP 108/68 | HR 62 | Ht 61.0 in | Wt 129.6 lb

## 2018-11-20 DIAGNOSIS — R9389 Abnormal findings on diagnostic imaging of other specified body structures: Secondary | ICD-10-CM | POA: Diagnosis not present

## 2018-11-20 NOTE — Progress Notes (Signed)
Name: Sheryl Michael MRN: [id number] DOB: [birth year]     CONSULTATION DATE: 09/18/2018 REFERRING MD : Dr. Genevive Bi  CHIEF COMPLAINT: Abnormal CT chest  STUDIES:      08/27/2018   CT chest Independently reviewed by Me today There is a right middle lobe opacification Likely post inflammatory Tory process and infection   CT chest 2014 There is a endobronchial narrowing of the right middle lobe airway   04/11/2018 CT chest independently reviewed  It seems to be an endobronchial lesion in the right middle lobe airway -shows a endobronchial lesion which is narrowing the airway on the right middle lobe CT chest 2014 shows similar endobronchial lesion which is narrowing the airway on the right middle lobe   After review of CT scans over the last several years there is a CT scan in 2014 which shows an endobronchial lesion at that time which may correlate to a tortuous airway or just narrowing airway from fibrosis  There is seems to be a postobstructive process and infection at the level of the right middle lobe the most recent CT chest was 08/27/18 Patient subsequently ended up getting a PET scan a week later 09/06/18 which showed interval growth and progression of the abnormality in RML  HISTORY OF PRESENT ILLNESS: Patient is here for follow up abnormal CT chest persistent RML opacity that has increased in size Infection/inflammtyion or malignancy is possibility  Patient has non productive cough No fevers, chills No NVD  No SOB or DOE No chest pain  I have reviewed CT scans with patient and family Current CT 11/07/2018 still has persistent RML opacity with increased growth  I strongly recommend END and air way examination   The Risks and Benefits of the Bronchoscopy procedure with ENB were explained to patient/family.  I have discussed the risk for Acute Bleeding, increased chance of Infection, increased chance of Respiratory Failure and Cardiac Arrest, increased chance of  pneumothorax and collapsed lung, as well as increased Stroke and Death.  I have also explained to avoid all types of NSAIDs 1 week prior to procedure date  to decrease chance of bleeding, and also to avoid food and drinks the midnight prior to procedure.  The procedure consists of a video camera with a light source to be placed and inserted  into the lungs to  look for abnormal tissue and to obtain tissue samples by using needle and biopsy tools.  The patient/family understand the risks and benefits and have agreed to proceed with procedure.  Patient is on oral anticoagulation with Eliquis in setting of Afib Patient will have increased risk of stroke is she were to stop this medicine and will need pre-op cardiology assessment prior to procedure     Review of Systems:  Gen:  Denies  fever, sweats, chills weigh loss  HEENT: Denies blurred vision, double vision, ear pain, eye pain, hearing loss, nose bleeds, sore throat Cardiac:  No dizziness, chest pain or heaviness, chest tightness,edema, No JVD Resp:   + cough, -sputum production, -shortness of breath,-wheezing, -hemoptysis,  Gi: Denies swallowing difficulty, stomach pain, nausea or vomiting, diarrhea, constipation, bowel incontinence Gu:  Denies bladder incontinence, burning urine Ext:   Denies Joint pain, stiffness or swelling Skin: Denies  skin rash, easy bruising or bleeding or hives Endoc:  Denies polyuria, polydipsia , polyphagia or weight change Psych:   Denies depression, insomnia or hallucinations  Other:  All other systems negative    Ht 5\' 1"  (1.549 m)   Wt  129 lb 9.6 oz (58.8 kg)   BMI 24.49 kg/m  BP 108/68 (BP Location: Left Arm, Cuff Size: Normal)   Pulse 62   Ht 5\' 1"  (1.549 m)   Wt 129 lb 9.6 oz (58.8 kg)   SpO2 97%   BMI 24.49 kg/m    Physical Examination:   GENERAL:NAD, no fevers, chills, no weakness no fatigue HEAD: Normocephalic, atraumatic.  EYES: PERLA, EOMI No scleral icterus.  MOUTH: Moist  mucosal membrane.  EAR, NOSE, THROAT: Clear without exudates. No external lesions.  NECK: Supple. No thyromegaly.  No JVD.  PULMONARY: CTA B/L no wheezing, rhonchi, crackles CARDIOVASCULAR: S1 and S2. Regular rate and rhythm. No murmurs GASTROINTESTINAL: Soft, nontender, nondistended. Positive bowel sounds.  MUSCULOSKELETAL: No swelling, clubbing, or edema.  NEUROLOGIC: No gross focal neurological deficits. 5/5 strength all extremities SKIN: No ulceration, lesions, rashes, or cyanosis.  PSYCHIATRIC: Insight, judgment intact. -depression -anxiety ALL OTHER ROS ARE NEGATIVE       ASSESSMENT / PLAN:   85 yo pleasant white female with abnromal CT chest with persisitent RML opacity which seems to be post obstructive process of inflammation/infection with probable underlying endobronchial lesion endobronchial lesion which could be related to airway fibrosis or tortuous airway which seems to be chronic  Its a possibility that patient may have under atypical myxobacterial infection  And inflammation, underlying malignancy is also a possibility.   I recommend ENB with airway examination with general anesthesia Patient needs Preop-assessment with her cardiologist with regards to oral anticoagulation    Maretta Bees Patricia Pesa, M.D.  [facility]  Medical Director [place] Director [facility] Cardio-Pulmonary Department

## 2018-11-20 NOTE — Patient Instructions (Signed)
Plan for ENB   The Risks and Benefits of the Bronchoscopy procedure with ENB were explained to patient/family.  I have discussed the risk for Acute Bleeding, increased chance of Infection, increased chance of Respiratory Failure and Cardiac Arrest, increased chance of pneumothorax and collapsed lung, as well as increased Stroke and Death.  I have also explained to avoid all types of NSAIDs 1 week prior to procedure date  to decrease chance of bleeding, and also to avoid food and drinks the midnight prior to procedure.  The procedure consists of a video camera with a light source to be placed and inserted  into the lungs to  look for abnormal tissue and to obtain tissue samples by using needle and biopsy tools.  The patient/family understand the risks and benefits and have agreed to proceed with procedure.    Plan for Cardiology Consultation Pre-Op assessment

## 2018-11-23 NOTE — Progress Notes (Signed)
Cardiac Clearance received from Dr. Etta Quill office regarding bronchoscopy. Will notify Dr. Mortimer Fries and will get scanned into chart.

## 2018-11-23 NOTE — Progress Notes (Signed)
Thank you :)

## 2018-11-23 NOTE — H&P (View-Only) (Signed)
Thank you :)

## 2018-11-28 ENCOUNTER — Telehealth: Payer: Self-pay

## 2018-11-28 DIAGNOSIS — R918 Other nonspecific abnormal finding of lung field: Secondary | ICD-10-CM

## 2018-11-28 NOTE — Telephone Encounter (Signed)
LM for patient to call regarding scheduling bronchscopy.

## 2018-11-28 NOTE — Telephone Encounter (Signed)
Spoke with patient, she is aware of Dr. Etta Quill clearance, will proceed with scheduling. I will call her back with all scheduled apts.

## 2018-11-29 NOTE — Telephone Encounter (Signed)
CT Chest Super D procedure code [phone] has been approved. [employer] Authorization # [id number] Valid  11/28/2018 to 12/27/2018.  Called [employer] on 11/29/2018 at 12:04 pm EST spoke with [place]. Procedure code [phone] is a valid and billable code which does not require PA. Call Ref # [id number]. Rhonda J Cobb

## 2018-11-29 NOTE — Telephone Encounter (Signed)
Spoke to patient regarding bronchoscopy. She is aware of all dates and times. Patient is aware to stop her Eliquis 5 days before and restart 3 days after procedure.   Procedure: ENB DX: Right middle lobe mass CPT: 03013 Date: 12/13/18 Super D CT scan 12/13/18 @9 :00 (8:45 arrival)

## 2018-12-04 ENCOUNTER — Encounter
Admission: RE | Admit: 2018-12-04 | Discharge: 2018-12-04 | Disposition: A | Discharge status: Home / Self Care | Payer: Medicare Other | Source: Ambulatory Visit | Attending: Internal Medicine | Admitting: Internal Medicine

## 2018-12-04 ENCOUNTER — Other Ambulatory Visit: Payer: Self-pay

## 2018-12-04 DIAGNOSIS — Z01810 Encounter for preprocedural cardiovascular examination: Secondary | ICD-10-CM | POA: Insufficient documentation

## 2018-12-04 HISTORY — DX: Hypothyroidism, unspecified: E03.9

## 2018-12-04 HISTORY — DX: Unspecified atrial fibrillation: I48.91

## 2018-12-04 HISTORY — DX: Gastro-esophageal reflux disease without esophagitis: K21.9

## 2018-12-04 HISTORY — DX: Age-related osteoporosis without current pathological fracture: M81.0

## 2018-12-04 HISTORY — DX: Unspecified osteoarthritis, unspecified site: M19.90

## 2018-12-04 HISTORY — DX: Pneumonia, unspecified organism: J18.9

## 2018-12-04 NOTE — Pre-Procedure Instructions (Signed)
FAXED AND MESSAGED DR CALLWOOD TO REVIEW PREOP EKG. CLEARANCE ON CHART PRIOR TO EKG

## 2018-12-04 NOTE — Patient Instructions (Signed)
Your procedure is scheduled on: Thursday, Feb. 27 Report to Day Surgery on the 2nd floor of the [facility]. To find out your arrival time, please call [phone] between 1PM - 3PM on: Wednesday, Feb 26  REMEMBER: Instructions that are not followed completely may result in serious medical risk, up to and including death; or upon the discretion of your surgeon and anesthesiologist your surgery may need to be rescheduled.  Do not eat food after midnight the night before surgery.  No gum chewing, lozengers or hard candies.  You may however, drink CLEAR liquids up to 2 hours before you are scheduled to arrive for your surgery. Do not drink anything within 2 hours of the start of your surgery.  Clear liquids include: - water  - apple juice without pulp - gatorade - black coffee or tea (Do NOT add milk or creamers to the coffee or tea) Do NOT drink anything that is not on this list.  No Alcohol for 24 hours before or after surgery.  No Smoking including e-cigarettes for 24 hours prior to surgery.  No chewable tobacco products for at least 6 hours prior to surgery.  No nicotine patches on the day of surgery.  On the morning of surgery brush your teeth with toothpaste and water, you may rinse your mouth with mouthwash if you wish. Do not swallow any toothpaste or mouthwash.  Notify your doctor if there is any change in your medical condition (cold, fever, infection).  Do not wear jewelry, make-up, hairpins, clips or nail polish.  Do not wear lotions, powders, or perfumes.   Do not shave 48 hours prior to surgery.   Contacts and dentures may not be worn into surgery.  Do not bring valuables to the hospital, including drivers license, insurance or credit cards.  [facility] is not responsible for any belongings or valuables.   TAKE THESE MEDICATIONS THE MORNING OF SURGERY:  1.  Tylenol (if needed for pain) 2.  Diltiazem 3.  Lexapro 4.  Synthroid 5.  Prilosec - (take one the  night before and one on the morning of surgery - helps to prevent nausea after surgery.)  Follow recommendations from Cardiologist, Pulmonologist or PCP regarding stopping Eliquis.  Starting Feb. 20 - Stop Anti-inflammatories (NSAIDS) such as Advil, Aleve, Ibuprofen, Motrin, Naproxen, Naprosyn and Aspirin based products such as Excedrin, Goodys Powder, BC Powder. (May take Tylenol or Acetaminophen if needed.)  Starting Feb 20 - Stop ANY OVER THE COUNTER supplements until after surgery.  If you are being discharged the day of surgery, you will not be allowed to drive home. You will need a responsible adult to drive you home and stay with you that night.   If you are taking public transportation, you will need to have a responsible adult with you. Please confirm with your physician that it is acceptable to use public transportation.   Please call [phone] if you have any questions about these instructions.

## 2018-12-05 ENCOUNTER — Telehealth: Payer: Self-pay | Admitting: Internal Medicine

## 2018-12-10 NOTE — Telephone Encounter (Signed)
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

## 2018-12-13 ENCOUNTER — Ambulatory Visit
Admission: RE | Admit: 2018-12-13 | Discharge: 2018-12-13 | Disposition: A | Discharge status: Home / Self Care | Payer: Medicare Other | Source: Ambulatory Visit | Attending: Internal Medicine | Admitting: Internal Medicine

## 2018-12-13 ENCOUNTER — Ambulatory Visit: Payer: Medicare Other | Admitting: Certified Registered"

## 2018-12-13 ENCOUNTER — Encounter: Payer: Self-pay | Admitting: *Deleted

## 2018-12-13 ENCOUNTER — Other Ambulatory Visit: Payer: Self-pay

## 2018-12-13 ENCOUNTER — Encounter
Admission: RE | Disposition: A | Discharge status: Home / Self Care | Payer: Self-pay | Source: Ambulatory Visit | Attending: Internal Medicine

## 2018-12-13 DIAGNOSIS — Z853 Personal history of malignant neoplasm of breast: Secondary | ICD-10-CM | POA: Insufficient documentation

## 2018-12-13 DIAGNOSIS — R599 Enlarged lymph nodes, unspecified: Secondary | ICD-10-CM | POA: Diagnosis not present

## 2018-12-13 DIAGNOSIS — F329 Major depressive disorder, single episode, unspecified: Secondary | ICD-10-CM | POA: Insufficient documentation

## 2018-12-13 DIAGNOSIS — E782 Mixed hyperlipidemia: Secondary | ICD-10-CM | POA: Diagnosis not present

## 2018-12-13 DIAGNOSIS — Z8673 Personal history of transient ischemic attack (TIA), and cerebral infarction without residual deficits: Secondary | ICD-10-CM | POA: Diagnosis not present

## 2018-12-13 DIAGNOSIS — K219 Gastro-esophageal reflux disease without esophagitis: Secondary | ICD-10-CM | POA: Insufficient documentation

## 2018-12-13 DIAGNOSIS — Z79890 Hormone replacement therapy (postmenopausal): Secondary | ICD-10-CM | POA: Insufficient documentation

## 2018-12-13 DIAGNOSIS — R918 Other nonspecific abnormal finding of lung field: Secondary | ICD-10-CM

## 2018-12-13 DIAGNOSIS — Z79899 Other long term (current) drug therapy: Secondary | ICD-10-CM | POA: Diagnosis not present

## 2018-12-13 DIAGNOSIS — Z7901 Long term (current) use of anticoagulants: Secondary | ICD-10-CM | POA: Insufficient documentation

## 2018-12-13 DIAGNOSIS — E039 Hypothyroidism, unspecified: Secondary | ICD-10-CM | POA: Diagnosis not present

## 2018-12-13 DIAGNOSIS — I480 Paroxysmal atrial fibrillation: Secondary | ICD-10-CM | POA: Diagnosis not present

## 2018-12-13 DIAGNOSIS — R911 Solitary pulmonary nodule: Secondary | ICD-10-CM | POA: Diagnosis present

## 2018-12-13 DIAGNOSIS — R9389 Abnormal findings on diagnostic imaging of other specified body structures: Secondary | ICD-10-CM | POA: Diagnosis not present

## 2018-12-13 HISTORY — PX: ELECTROMAGNETIC NAVIGATION BROCHOSCOPY: SHX5369

## 2018-12-13 SURGERY — ELECTROMAGNETIC NAVIGATION BRONCHOSCOPY
Anesthesia: General | Laterality: Right

## 2018-12-13 MED ORDER — PROPOFOL 500 MG/50ML IV EMUL
INTRAVENOUS | Status: AC
  Filled 2018-12-13: qty 50

## 2018-12-13 MED ORDER — DEXAMETHASONE SODIUM PHOSPHATE 10 MG/ML IJ SOLN
INTRAMUSCULAR | Status: DC | PRN
  Administered 2018-12-13 (×2): 4 mg via INTRAVENOUS

## 2018-12-13 MED ORDER — ONDANSETRON HCL 4 MG/2ML IJ SOLN
INTRAMUSCULAR | Status: DC | PRN
  Administered 2018-12-13: 4 mg via INTRAVENOUS

## 2018-12-13 MED ORDER — LIDOCAINE HCL (CARDIAC) PF 100 MG/5ML IV SOSY
PREFILLED_SYRINGE | INTRAVENOUS | Status: DC | PRN
  Administered 2018-12-13: 60 mg via INTRAVENOUS

## 2018-12-13 MED ORDER — SUCCINYLCHOLINE CHLORIDE 20 MG/ML IJ SOLN
INTRAMUSCULAR | Status: AC
  Filled 2018-12-13: qty 1

## 2018-12-13 MED ORDER — SUGAMMADEX SODIUM 200 MG/2ML IV SOLN
INTRAVENOUS | Status: AC
  Filled 2018-12-13: qty 4

## 2018-12-13 MED ORDER — DEXAMETHASONE SODIUM PHOSPHATE 4 MG/ML IJ SOLN
INTRAMUSCULAR | Status: AC
  Filled 2018-12-13: qty 1

## 2018-12-13 MED ORDER — ROCURONIUM BROMIDE 100 MG/10ML IV SOLN
INTRAVENOUS | Status: DC | PRN
  Administered 2018-12-13: 30 mg via INTRAVENOUS

## 2018-12-13 MED ORDER — PROPOFOL 10 MG/ML IV BOLUS
INTRAVENOUS | Status: DC | PRN
  Administered 2018-12-13: 80 mg via INTRAVENOUS

## 2018-12-13 MED ORDER — ROCURONIUM BROMIDE 50 MG/5ML IV SOLN
INTRAVENOUS | Status: AC
  Filled 2018-12-13: qty 1

## 2018-12-13 MED ORDER — PROPOFOL 10 MG/ML IV BOLUS
INTRAVENOUS | Status: AC
  Filled 2018-12-13: qty 20

## 2018-12-13 MED ORDER — ONDANSETRON HCL 4 MG/2ML IJ SOLN
INTRAMUSCULAR | Status: AC
  Filled 2018-12-13: qty 2

## 2018-12-13 MED ORDER — PROPOFOL 500 MG/50ML IV EMUL
INTRAVENOUS | Status: DC | PRN
  Administered 2018-12-13: 100 ug/kg/min via INTRAVENOUS

## 2018-12-13 MED ORDER — FENTANYL CITRATE (PF) 100 MCG/2ML IJ SOLN
INTRAMUSCULAR | Status: DC | PRN
  Administered 2018-12-13: 50 ug via INTRAVENOUS

## 2018-12-13 MED ORDER — FENTANYL CITRATE (PF) 100 MCG/2ML IJ SOLN: 25.0000 ug | INTRAMUSCULAR | Status: DC | PRN

## 2018-12-13 MED ORDER — SUGAMMADEX SODIUM 200 MG/2ML IV SOLN
INTRAVENOUS | Status: DC | PRN
  Administered 2018-12-13: 300 mg via INTRAVENOUS

## 2018-12-13 MED ORDER — PHENYLEPHRINE HCL 10 MG/ML IJ SOLN
INTRAMUSCULAR | Status: DC | PRN
  Administered 2018-12-13 (×2): 100 ug via INTRAVENOUS

## 2018-12-13 MED ORDER — LACTATED RINGERS IV SOLN
INTRAVENOUS | Status: DC
  Administered 2018-12-13: 13:00:00 via INTRAVENOUS

## 2018-12-13 MED ORDER — PHENYLEPHRINE HCL 0.25 % NA SOLN
1.0000 | Freq: Four times a day (QID) | NASAL | Status: DC | PRN
  Filled 2018-12-13: qty 15

## 2018-12-13 MED ORDER — FENTANYL CITRATE (PF) 100 MCG/2ML IJ SOLN
INTRAMUSCULAR | Status: AC
  Filled 2018-12-13: qty 2

## 2018-12-13 MED ORDER — ONDANSETRON HCL 4 MG/2ML IJ SOLN: 4.0000 mg | Freq: Once | INTRAMUSCULAR | Status: DC | PRN

## 2018-12-13 MED ORDER — LIDOCAINE HCL (PF) 1 % IJ SOLN
INTRAMUSCULAR | Status: AC
  Filled 2018-12-13: qty 5

## 2018-12-13 MED ORDER — LIDOCAINE HCL 2 % EX GEL
1.0000 "application " | Freq: Once | CUTANEOUS | Status: DC
  Filled 2018-12-13: qty 4250

## 2018-12-13 NOTE — Anesthesia Post-op Follow-up Note (Signed)
Anesthesia QCDR form completed.        

## 2018-12-13 NOTE — Interval H&P Note (Signed)
History and Physical Interval Note:  12/13/2018 12:48 PM  Sheryl Michael  has presented today for surgery, with the diagnosis of RIGHT MIDDLE LOBE MASS  The various methods of treatment have been discussed with the patient and family. After consideration of risks, benefits and other options for treatment, the patient has consented to  Procedure(s): ELECTROMAGNETIC NAVIGATION BRONCHOSCOPY (Right) as a surgical intervention .  The patient's history has been reviewed, patient examined, no change in status, stable for surgery.  I have reviewed the patient's chart and labs.  Questions were answered to the patient's satisfaction.     The Risks and Benefits of the Bronchoscopy procedure with EBUS were explained to patient/family.  I have discussed the risk for Acute Bleeding, increased chance of Infection, increased chance of Respiratory Failure and Cardiac Arrest, increased chance of pneumothorax and collapsed lung, as well as increased Stroke and Death.  I have also explained to avoid all types of NSAIDs 1 week prior to procedure date  to decrease chance of bleeding, and also to avoid food and drinks the midnight prior to procedure.  The procedure consists of a video camera with a light source to be placed and inserted  into the lungs to  look for abnormal tissue and to obtain tissue samples by using needle and biopsy tools.  The patient/family understand the risks and benefits and have agreed to proceed with procedure.   Flora Lipps

## 2018-12-13 NOTE — Anesthesia Procedure Notes (Signed)
Procedure Name: Intubation Date/Time: 12/13/2018 1:27 PM Performed by: Lavone Orn, CRNA Pre-anesthesia Checklist: Patient identified, Emergency Drugs available, Suction available, Patient being monitored and Timeout performed Patient Re-evaluated:Patient Re-evaluated prior to induction Oxygen Delivery Method: Circle system utilized Preoxygenation: Pre-oxygenation with 100% oxygen Induction Type: IV induction Ventilation: Mask ventilation without difficulty Laryngoscope Size: Mac and 3 Grade View: Grade II Tube size: 8.5 mm Number of attempts: 1 Airway Equipment and Method: Stylet Placement Confirmation: ETT inserted through vocal cords under direct vision,  positive ETCO2 and breath sounds checked- equal and bilateral Secured at: 23 cm Tube secured with: Tape Dental Injury: Teeth and Oropharynx as per pre-operative assessment

## 2018-12-13 NOTE — Op Note (Signed)
  PROCEDURE: BRONCHOSCOPY Therapeutic Aspiration of Tracheobronchial Tree  PROCEDURE DATE: 12/13/2018  TIME:  NAME:  Sheryl Michael  DOB:[birth year]  MRN: [id number] LOC:  ARPO/None    HOSP DAY:    Indications/Preliminary Diagnosis: RT Lung opacity/pneumonia  Consent: (Place X beside choice/s below)  The benefits, risks and possible complications of the procedure were        explained to:  __x_ patient  __x_ patient's family  ___ other:___________  who verbalized understanding and gave:  ___ verbal  ___ written  __x_ verbal and written  ___ telephone  ___ other:________ consent.      Unable to obtain consent; procedure performed on emergent basis.     Other:       PRESEDATION ASSESSMENT: History and Physical has been performed. Patient meds and allergies have been reviewed. Presedation airway examination has been performed and documented. Baseline vital signs, sedation score, oxygenation status, and cardiac rhythm were reviewed. Patient was deemed to be in satisfactory condition to undergo the procedure.    PROCEDURE DETAILS: Timeout performed and correct patient, name, & ID confirmed. Following prep per Pulmonary policy, appropriate sedation was administered. The Bronchoscope was inserted in to oral cavity with bite block in place. Therapeutic aspiration of Tracheobronchial tree was performed.  Airway exam proceeded with findings, technical procedures, and specimen collection as noted below. At the end of exam the scope was withdrawn without incident. Impression and Plan as noted below.           Airway Prep (Place X beside choice below)   1% Transtracheal Lidocaine Anesthetization 7 cc   Patient prepped per Bronchoscopy Lab Policy       Insertion Route (Place X beside choice below)   Nasal   Oral  x Endotracheal Tube   Tracheostomy    TECHNICAL PROCEDURES: (Place X beside choice below)   Procedures  Description    None     Electrocautery     Cryotherapy    Balloon Dilatation     Bronchography     Stent Placement     Therapeutic Aspiration     Laser/Argon Plasma    Brachytherapy Catheter Placement    Foreign Body Removal         SPECIMENS (Sites): (Place X beside choice below)  Specimens Description   No Specimens Obtained     Washings   x Lavage Instilled 40 cc's into RML-extracted 15 cc's of cloudy BAL   Biopsies    Fine Needle Aspirates    Brushings    Sputum    FINDINGS:  No obvious airways disease No lesions No masses  ESTIMATED BLOOD LOSS: none COMPLICATIONS/RESOLUTION: none      IMPRESSION:POST-PROCEDURE DX:  Resolving pneumonia   RECOMMENDATION/PLAN:  Follow up BAL cultures    Corrin Parker, M.D.  [facility]  Medical Director [place] Director [place] Department

## 2018-12-13 NOTE — Anesthesia Preprocedure Evaluation (Signed)
Anesthesia Evaluation  Patient identified by MRN, date of birth, ID band Patient awake    Reviewed: Allergy & Precautions, NPO status , Patient's Chart, lab work & pertinent test results  History of Anesthesia Complications Negative for: history of anesthetic complications  Airway Mallampati: II  TM Distance: >3 FB Neck ROM: Full    Dental  (+) Poor Dentition   Pulmonary neg sleep apnea, neg COPD,  Lung mass    breath sounds clear to auscultation- rhonchi (-) wheezing      Cardiovascular (-) hypertension(-) CAD, (-) Past MI, (-) Cardiac Stents and (-) CABG  Rhythm:Regular Rate:Normal - Systolic murmurs and - Diastolic murmurs    Neuro/Psych neg Seizures PSYCHIATRIC DISORDERS Depression TIA   GI/Hepatic Neg liver ROS, GERD  ,  Endo/Other  neg diabetesHypothyroidism   Renal/GU negative Renal ROS     Musculoskeletal  (+) Arthritis ,   Abdominal (+) - obese,   Peds  Hematology negative hematology ROS (+)   Anesthesia Other Findings Past Medical History: No date: Arthritis No date: Atrial fibrillation ([place]) 2006: Breast cancer ([place])     Comment:  right breast ca with lumpectomy and rad tx No date: Colon polyp No date: Cystocele No date: Depression No date: Female bladder prolapse No date: GERD (gastroesophageal reflux disease) No date: Goiter No date: Hyperlipemia No date: Hypothyroidism No date: Osteoporosis No date: Pneumonia 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 No date: Recurrent UTI No date: Reflux No date: TIA (transient ischemic attack) No date: Vaginal atrophy   Reproductive/Obstetrics                             Anesthesia Physical Anesthesia Plan  ASA: III  Anesthesia Plan: General   Post-op Pain Management:    Induction: Intravenous  PONV Risk Score and Plan: 2 and Ondansetron and Dexamethasone  Airway Management Planned: Oral ETT  Additional Equipment:    Intra-op Plan:   Post-operative Plan: Extubation in OR  Informed Consent: I have reviewed the patients History and Physical, chart, labs and discussed the procedure including the risks, benefits and alternatives for the proposed anesthesia with the patient or authorized representative who has indicated his/her understanding and acceptance.     Dental advisory given  Plan Discussed with: CRNA and Anesthesiologist  Anesthesia Plan Comments:         Anesthesia Quick Evaluation

## 2018-12-13 NOTE — Discharge Instructions (Signed)
° ° ° °  Flexible Bronchoscopy, Care After This sheet gives you information about how to care for yourself after your test. Your doctor may also give you more specific instructions. If you have problems or questions, contact your doctor. Follow these instructions at home: Eating and drinking  Do not eat or drink anything (not even water) for 2 hours after your test, or until your numbing medicine (local anesthetic) wears off.  When your numbness is gone and your cough and gag reflexes have come back, you may: ? Eat only soft foods. ? Slowly drink liquids.  The day after the test, go back to your normal diet. Driving  Do not drive for 24 hours if you were given a medicine to help you relax (sedative).  Do not drive or use heavy machinery while taking prescription pain medicine. General instructions   Take over-the-counter and prescription medicines only as told by your doctor.  Return to your normal activities as told. Ask what activities are safe for you.  Do not use any products that have nicotine or tobacco in them. This includes cigarettes and e-cigarettes. If you need help quitting, ask your doctor.  Keep all follow-up visits as told by your doctor. This is important. It is very important if you had a tissue sample (biopsy) taken. Get help right away if:  You have shortness of breath that gets worse.  You get light-headed.  You feel like you are going to pass out (faint).  You have chest pain.  You cough up: ? More than a little blood. ? More blood than before. Summary  Do not eat or drink anything (not even water) for 2 hours after your test, or until your numbing medicine wears off.  Do not use cigarettes. Do not use e-cigarettes.  Get help right away if you have chest pain. This information is not intended to replace advice given to you by your health care provider. Make sure you discuss any questions you have with your health care provider. Document Released:  07/31/2009 Document Revised: 10/21/2016 Document Reviewed: 10/21/2016 Elsevier Interactive Patient Education  2019 [employer]   1) The drugs that you were given will stay in your system until tomorrow so for the next 24 hours you should not:  A) Drive an automobile B) Make any legal decisions C) Drink any alcoholic beverage   2) You may resume regular meals tomorrow.  Today it is better to start with liquids and gradually work up to solid foods.  You may eat anything you prefer, but it is better to start with liquids, then soup and crackers, and gradually work up to solid foods.   3) Please notify your doctor immediately if you have any unusual bleeding, trouble breathing, redness and pain at the surgery site, drainage, fever, or pain not relieved by medication. 4)   5) Your post-operative visit with Dr.                                     is: Date:                        Time:    Please call to schedule your post-operative visit.  6) Additional Instructions:

## 2018-12-13 NOTE — Transfer of Care (Signed)
Immediate Anesthesia Transfer of Care Note  Patient: Sheryl Michael  Procedure(s) Performed: ELECTROMAGNETIC NAVIGATION BRONCHOSCOPY (Right )  Patient Location: PACU  Anesthesia Type:General  Level of Consciousness: awake  Airway & Oxygen Therapy: Patient Spontanous Breathing and Patient connected to face mask oxygen  Post-op Assessment: Report given to RN and Post -op Vital signs reviewed and stable  Post vital signs: stable  Last Vitals:  Vitals Value Taken Time  BP 123/46 12/13/2018  2:15 PM  Temp 36.3 C 12/13/2018  2:15 PM  Pulse 68 12/13/2018  2:20 PM  Resp 14 12/13/2018  2:20 PM  SpO2 96 % 12/13/2018  2:20 PM  Vitals shown include unvalidated device data.  Last Pain:  Vitals:   12/13/18 1415  TempSrc:   PainSc: 0-No pain         Complications: No apparent anesthesia complications

## 2018-12-13 NOTE — Op Note (Signed)
PROCEDURE: ENDOBRONCHIAL ULTRASOUND   PROCEDURE DATE: 12/13/2018  TIME:  NAME:  Sheryl Michael  DOB:20-Aug-1931  MRN: [id number] LOC:  ARPO/None    HOSP DAY: @LENGTHOFSTAYDAYS @ CODE STATUS:   Code Status History    Date Active Date Inactive Code Status Order ID Comments User Context   05/30/2017 2032 05/31/2017 1658 Full Code [id number]  Theodoro Grist, MD Inpatient   12/21/2015 1132 12/22/2015 1807 Full Code [id number]  Defrancesco, Alanda Slim, MD Inpatient          Indications/Preliminary Diagnosis:Adenopathy  Consent: (Place X beside choice/s below)  The benefits, risks and possible complications of the procedure were        explained to:  _x__ patient  _x__ patient's family  ___ other:___________  who verbalized understanding and gave:  ___ verbal  ___ written  _x__ verbal and written  ___ telephone  ___ other:________ consent.      Unable to obtain consent; procedure performed on emergent basis.     Other:       PRESEDATION ASSESSMENT: History and Physical has been performed. Patient meds and allergies have been reviewed. Presedation airway examination has been performed and documented. Baseline vital signs, sedation score, oxygenation status, and cardiac rhythm were reviewed. Patient was deemed to be in satisfactory condition to undergo the procedure.    PREMEDICATIONS: SEE ANESTHESIOLOGY RECORDS   Insertion Route (Place X beside choice below)   Nasal   Oral  x Endotracheal Tube   Tracheostomy   INTRAPROCEDURE MEDICATIONS: SEE ANESTHESIOLOGY RECORDS   PROCEDURE DETAILS: Timeout performed and correct patient, name, & ID confirmed. Following prep per Pulmonary policy, appropriate sedation was administered.  I proceeded with introducing the endobronchial [place] scope and findings, technical procedures, and specimen collection as noted below. At the end of exam the scope was withdrawn without incident. Impression and Plan as noted below.   SPECIMENS (Sites): (Place X beside choice  below)  Specimens Description   No Specimens Obtained     Washings    Lavage    Biopsies   x Fine Needle Aspirates 3 samples taken   Brushings    Sputum    FINDINGS: small anterior paratracheal LN visualized  approx 2 x 1 CM  ESTIMATED BLOOD LOSS: none COMPLICATIONS/RESOLUTION: none       IMPRESSION:POST-PROCEDURE DX: ADENOPATHY    RECOMMENDATION/PLAN:  Follow up Pathology Reports     Corrin Parker, M.D.  [facility]  Medical Director [place] Director [place] Department

## 2018-12-14 ENCOUNTER — Encounter: Payer: Self-pay | Admitting: Internal Medicine

## 2018-12-14 LAB — ACID FAST SMEAR (AFB): ACID FAST SMEAR - AFSCU2: NEGATIVE

## 2018-12-14 LAB — CYTOLOGY - NON PAP

## 2018-12-14 LAB — ACID FAST SMEAR (AFB, MYCOBACTERIA)

## 2018-12-16 LAB — CULTURE, BAL-QUANTITATIVE W GRAM STAIN
Culture: NO GROWTH
Special Requests: NORMAL

## 2018-12-17 NOTE — Anesthesia Postprocedure Evaluation (Signed)
Anesthesia Post Note  Patient: Sheryl Michael  Procedure(s) Performed: ELECTROMAGNETIC NAVIGATION BRONCHOSCOPY (Right )  Patient location during evaluation: PACU Anesthesia Type: General Level of consciousness: awake and alert and oriented Pain management: pain level controlled Vital Signs Assessment: post-procedure vital signs reviewed and stable Respiratory status: spontaneous breathing Cardiovascular status: blood pressure returned to baseline Anesthetic complications: no     Last Vitals:  Vitals:   12/13/18 1559 12/13/18 1633  BP: (!) 141/55 (!) 118/53  Pulse: 68 63  Resp: 16 16  Temp: (!) 36.1 C   SpO2: 97% 97%    Last Pain:  Vitals:   12/13/18 1633  TempSrc:   PainSc: 0-No pain                 Saylee Sherrill

## 2018-12-18 ENCOUNTER — Other Ambulatory Visit: Payer: Self-pay

## 2018-12-18 ENCOUNTER — Observation Stay
Admission: EM | Admit: 2018-12-18 | Discharge: 2018-12-20 | Disposition: A | Discharge status: Home / Self Care | Payer: Medicare Other | Attending: Internal Medicine | Admitting: Internal Medicine

## 2018-12-18 ENCOUNTER — Encounter: Payer: Self-pay | Admitting: Emergency Medicine

## 2018-12-18 DIAGNOSIS — Z7901 Long term (current) use of anticoagulants: Secondary | ICD-10-CM | POA: Insufficient documentation

## 2018-12-18 DIAGNOSIS — X58XXXA Exposure to other specified factors, initial encounter: Secondary | ICD-10-CM | POA: Insufficient documentation

## 2018-12-18 DIAGNOSIS — Z79899 Other long term (current) drug therapy: Secondary | ICD-10-CM | POA: Insufficient documentation

## 2018-12-18 DIAGNOSIS — K219 Gastro-esophageal reflux disease without esophagitis: Secondary | ICD-10-CM | POA: Diagnosis present

## 2018-12-18 DIAGNOSIS — Z8673 Personal history of transient ischemic attack (TIA), and cerebral infarction without residual deficits: Secondary | ICD-10-CM | POA: Insufficient documentation

## 2018-12-18 DIAGNOSIS — T7840XA Allergy, unspecified, initial encounter: Secondary | ICD-10-CM | POA: Diagnosis present

## 2018-12-18 DIAGNOSIS — T368X5A Adverse effect of other systemic antibiotics, initial encounter: Secondary | ICD-10-CM | POA: Diagnosis not present

## 2018-12-18 DIAGNOSIS — E039 Hypothyroidism, unspecified: Secondary | ICD-10-CM | POA: Diagnosis not present

## 2018-12-18 DIAGNOSIS — E785 Hyperlipidemia, unspecified: Secondary | ICD-10-CM | POA: Diagnosis not present

## 2018-12-18 DIAGNOSIS — Z885 Allergy status to narcotic agent status: Secondary | ICD-10-CM | POA: Insufficient documentation

## 2018-12-18 DIAGNOSIS — F329 Major depressive disorder, single episode, unspecified: Secondary | ICD-10-CM | POA: Diagnosis not present

## 2018-12-18 DIAGNOSIS — J020 Streptococcal pharyngitis: Secondary | ICD-10-CM | POA: Diagnosis not present

## 2018-12-18 DIAGNOSIS — Z881 Allergy status to other antibiotic agents status: Secondary | ICD-10-CM | POA: Diagnosis not present

## 2018-12-18 DIAGNOSIS — J029 Acute pharyngitis, unspecified: Secondary | ICD-10-CM | POA: Diagnosis present

## 2018-12-18 DIAGNOSIS — I4891 Unspecified atrial fibrillation: Principal | ICD-10-CM | POA: Diagnosis present

## 2018-12-18 LAB — COMPREHENSIVE METABOLIC PANEL
ALK PHOS: 71 U/L (ref 38–126)
ALT: 9 U/L (ref 0–44)
AST: 20 U/L (ref 15–41)
Albumin: 3.2 g/dL — ABNORMAL LOW (ref 3.5–5.0)
Anion gap: 12 (ref 5–15)
BUN: 9 mg/dL (ref 8–23)
CO2: 22 mmol/L (ref 22–32)
Calcium: 8.3 mg/dL — ABNORMAL LOW (ref 8.9–10.3)
Chloride: 105 mmol/L (ref 98–111)
Creatinine, Ser: 0.86 mg/dL (ref 0.44–1.00)
GFR calc Af Amer: >60 mL/min (ref >60)
GFR calc non Af Amer: >60 mL/min (ref >60)
Glucose, Bld: 138 mg/dL — ABNORMAL HIGH (ref 70–99)
Potassium: 3.7 mmol/L (ref 3.5–5.1)
Sodium: 139 mmol/L (ref 135–145)
Total Bilirubin: 0.7 mg/dL (ref 0.3–1.2)
Total Protein: 6.8 g/dL (ref 6.5–8.1)

## 2018-12-18 LAB — CBC WITH DIFFERENTIAL/PLATELET
Abs Immature Granulocytes: 0.05 10*3/uL (ref 0.00–0.07)
Basophils Absolute: 0 10*3/uL (ref 0.0–0.1)
Basophils Relative: 0 %
Eosinophils Absolute: 0.1 10*3/uL (ref 0.0–0.5)
Eosinophils Relative: 1 %
HCT: 39.4 % (ref 36.0–46.0)
Hemoglobin: 12.4 g/dL (ref 12.0–15.0)
Immature Granulocytes: 1 %
Lymphocytes Relative: 5 %
Lymphs Abs: 0.5 10*3/uL — ABNORMAL LOW (ref 0.7–4.0)
MCH: 30.6 pg (ref 26.0–34.0)
MCHC: 31.5 g/dL (ref 30.0–36.0)
MCV: 97.3 fL (ref 80.0–100.0)
MONOS PCT: 0 %
Monocytes Absolute: 0 10*3/uL — ABNORMAL LOW (ref 0.1–1.0)
Neutro Abs: 9.5 10*3/uL — ABNORMAL HIGH (ref 1.7–7.7)
Neutrophils Relative %: 93 %
Platelets: 216 10*3/uL (ref 150–400)
RBC: 4.05 MIL/uL (ref 3.87–5.11)
RDW: 14.6 % (ref 11.5–15.5)
WBC: 10.2 10*3/uL (ref 4.0–10.5)
nRBC: 0 % (ref 0.0–0.2)

## 2018-12-18 LAB — GROUP A STREP BY PCR: Group A Strep by PCR: NOT DETECTED

## 2018-12-18 MED ORDER — MAGNESIUM SULFATE 2 GM/50ML IV SOLN
2.0000 g | Freq: Once | INTRAVENOUS | Status: AC
  Administered 2018-12-18: 2 g via INTRAVENOUS
  Filled 2018-12-18: qty 50

## 2018-12-18 MED ORDER — DILTIAZEM HCL 60 MG PO TABS
60.0000 mg | ORAL_TABLET | Freq: Once | ORAL | Status: AC
  Administered 2018-12-18: 60 mg via ORAL
  Filled 2018-12-18: qty 1

## 2018-12-18 MED ORDER — DILTIAZEM HCL 25 MG/5ML IV SOLN
10.0000 mg | Freq: Once | INTRAVENOUS | Status: AC
  Administered 2018-12-18: 10 mg via INTRAVENOUS
  Filled 2018-12-18: qty 5

## 2018-12-18 MED ORDER — DEXAMETHASONE SODIUM PHOSPHATE 10 MG/ML IJ SOLN
10.0000 mg | Freq: Once | INTRAMUSCULAR | Status: AC
  Administered 2018-12-18: 10 mg via INTRAVENOUS
  Filled 2018-12-18: qty 1

## 2018-12-18 MED ORDER — CLINDAMYCIN PHOSPHATE 600 MG/50ML IV SOLN
600.0000 mg | Freq: Once | INTRAVENOUS | Status: AC
  Administered 2018-12-19: 600 mg via INTRAVENOUS
  Filled 2018-12-18: qty 50

## 2018-12-18 MED ORDER — SODIUM CHLORIDE 0.9 % IV BOLUS
500.0000 mL | Freq: Once | INTRAVENOUS | Status: AC
  Administered 2018-12-18: 500 mL via INTRAVENOUS

## 2018-12-18 MED ORDER — LIDOCAINE VISCOUS HCL 2 % MT SOLN
15.0000 mL | Freq: Once | OROMUCOSAL | Status: AC
  Administered 2018-12-18: 15 mL via OROMUCOSAL
  Filled 2018-12-18: qty 15

## 2018-12-18 MED ORDER — DILTIAZEM HCL 100 MG IV SOLR
5.0000 mg/h | INTRAVENOUS | Status: DC
  Administered 2018-12-18: 5 mg/h via INTRAVENOUS
  Filled 2018-12-18: qty 100

## 2018-12-18 NOTE — ED Triage Notes (Signed)
PT to ED via EMS for possible allergic reaction to Bactrim. PT had 1 pill and started to have rash and sore throat. Family gave 125mg  of benadryl. Per EMS VSS, was given 4mg  of zofran in route. Pt A&OX4 , RR even and unlabored.

## 2018-12-18 NOTE — ED Notes (Signed)
Mallie Mussel RN contacted pharmacy to obtain the Pt's cadizem drip. Pharmacy to sent it.

## 2018-12-18 NOTE — ED Provider Notes (Signed)
[facility] Emergency Department Provider Note    First MD Initiated Contact with Patient 12/18/18 2150     (approximate)  I have reviewed the triage vital signs and the nursing notes.   HISTORY  Chief Complaint Allergic Reaction    HPI Sheryl Michael is a 85 y.o. female presents the ER for evaluation of sore throat itching and allergic reaction to antibiotic she thinks.  Patient was given a course of Bactrim for upper respiratory infection.  She states that roughly 30 minutes after the ingestion she started having diffuse itching and breaking out in hives.  She gave her self Benadryl with some improvement but came to the ER because she does not want to take that antibiotic anymore.  She is also noted to be in A. fib with RVR.  Does not feel any chest pain or shortness of breath at this time.    Past Medical History:  Diagnosis Date  . Arthritis   . Atrial fibrillation ([place])   . Breast cancer ([place]) 2006   right breast ca with lumpectomy and rad tx  . Colon polyp   . Cystocele   . Depression   . Female bladder prolapse   . GERD (gastroesophageal reflux disease)   . Goiter   . Hyperlipemia   . Hypothyroidism   . Osteoporosis   . Pneumonia   . Procidentia of uterus   . Recurrent UTI   . Reflux   . TIA (transient ischemic attack)   . Vaginal atrophy    Family History  Problem Relation Age of Onset  . Diabetes Sister   . Breast cancer Sister        late 93's  . Diabetes Brother   . Diabetes Sister    Past Surgical History:  Procedure Laterality Date  . APPENDECTOMY    . BREAST BIOPSY Right 2006   breast cancer  . BREAST SURGERY Right    lumpectomy  . CATARACT EXTRACTION W/ INTRAOCULAR LENS  IMPLANT, BILATERAL    . COLONOSCOPY    . CYSTOCELE REPAIR N/A 12/21/2015   Procedure: ANTERIOR REPAIR (CYSTOCELE);  Surgeon: Brayton Mars, MD;  Location: [facility] ORS;  Service: Gynecology;  Laterality: N/A;  . DILATION AND CURETTAGE OF UTERUS      . ELECTROMAGNETIC NAVIGATION BROCHOSCOPY Right 12/13/2018   Procedure: ELECTROMAGNETIC NAVIGATION BRONCHOSCOPY;  Surgeon: Flora Lipps, MD;  Location: [facility] ORS;  Service: Cardiopulmonary;  Laterality: Right;  . EYE SURGERY    . VAGINAL HYSTERECTOMY Bilateral 12/21/2015   Procedure: TVH BSO;  Surgeon: Brayton Mars, MD;  Location: [facility] ORS;  Service: Gynecology;  Laterality: Bilateral;   Patient Active Problem List   Diagnosis Date Noted  . Sprain of interphalangeal joint of right ring finger 07/30/2018  . Traumatic complete tear of left rotator cuff 07/16/2018  . Strain of left hip 07/16/2018  . Encounter for Hemoccult screening 03/12/2018  . Hypothyroidism, acquired 07/10/2017  . Arrhythmia 05/31/2017  . Atrial fibrillation with RVR ([place]) 05/30/2017  . Dyspnea 05/30/2017  . Rotator cuff tendinitis, right 12/05/2016  . Shingles 12/29/2015  . S/P VH (vaginal hysterectomy) 12/22/2015  . Cystocele 12/21/2015  . Colon polyp 10/07/2015  . Bladder cystocele 10/07/2015  . Lower esophageal ring 10/07/2015  . H/O neoplasm 08/17/2015  . History of nonmelanoma skin cancer 08/17/2015  . Malignant neoplasm of breast ([place]) 04/01/2014  . Acid reflux 04/01/2014  . Big thyroid 04/01/2014  . HLD (hyperlipidemia) 04/01/2014  . OP (osteoporosis) 04/01/2014  .  Temporary cerebral vascular dysfunction 04/01/2014  . H/O gastrointestinal disease 02/28/2014      Prior to Admission medications   Medication Sig Start Date End Date Taking? Authorizing Provider  acetaminophen (TYLENOL) 325 MG tablet Take 650 mg by mouth every 6 (six) hours as needed for moderate pain.    [provider]  apixaban (ELIQUIS) 2.5 MG TABS tablet Take 1 tablet (2.5 mg total) by mouth 2 (two) times daily. 05/31/17   Demetrios Loll, MD  Calcium Carbonate-Vitamin D (CALTRATE 600+D PO) Take 1 tablet by mouth daily.    [provider]  diltiazem (CARDIZEM CD) 120 MG 24 hr capsule Take 1 capsule (120 mg total) by  mouth daily. 06/01/17   Demetrios Loll, MD  doxycycline (VIBRA-TABS) 100 MG tablet Take 100 mg by mouth 2 (two) times daily. 11/28/18   [provider]  escitalopram (LEXAPRO) 10 MG tablet Take 10 mg by mouth daily.     [provider]  levothyroxine (SYNTHROID, LEVOTHROID) 50 MCG tablet Take 50 mcg by mouth daily before breakfast.    [provider]  omeprazole (PRILOSEC OTC) 20 MG tablet Take 20 mg by mouth daily.     [provider]  predniSONE (DELTASONE) 10 MG tablet Take 10-60 mg by mouth See admin instructions. Take 60 mg by mouth for 2 days, then 40 mg for 2 days, then 20 mg for 2 days, then 10 mg for 2 days. 11/28/18   [provider]  sodium chloride (OCEAN) 0.65 % SOLN nasal spray Place 1 spray into both nostrils daily as needed for congestion.    [provider]    Allergies Iodinated diagnostic agents; Bactrim [sulfamethoxazole-trimethoprim]; Codeine; Erythromycin ethylsuccinate; Phenobarbital; Ciprofloxacin; and Penicillin v potassium    Social History Social History   Tobacco Use  . Smoking status: Never Smoker  . Smokeless tobacco: Never Used  Substance Use Topics  . Alcohol use: No  . Drug use: No    Review of Systems Patient denies headaches, rhinorrhea, blurry vision, numbness, shortness of breath, chest pain, edema, cough, abdominal pain, nausea, vomiting, diarrhea, dysuria, fevers, rashes or hallucinations unless otherwise stated above in HPI. ____________________________________________   PHYSICAL EXAM:  VITAL SIGNS: Vitals:   12/18/18 2200 12/18/18 2230  BP: 129/67 140/68  Pulse: (!) 139 (!) 152  Resp: 18 (!) 22  SpO2: 96% 95%    Constitutional: Alert and oriented.  Eyes: Conjunctivae are normal.  Head: Atraumatic. Nose: No congestion/rhinnorhea. Mouth/Throat: Mucous membranes are moist.  Fullness of the right peritonsillar pillar than the left side with erythema and exudate bilateral tonsils.  Uvula is  midline..   Neck: No stridor. Painless ROM.  Cardiovascular: Normal rate, regular rhythm. Grossly normal heart sounds.  Good peripheral circulation. Respiratory: Normal respiratory effort.  No retractions. Lungs CTAB. Gastrointestinal: Soft and nontender. No distention. No abdominal bruits. No CVA tenderness. Genitourinary:  Musculoskeletal: No lower extremity tenderness nor edema.  No joint effusions. Neurologic:  Normal speech and language. No gross focal neurologic deficits are appreciated. No facial droop Skin:  Skin is warm, dry and intact. No rash noted. Psychiatric: Mood and affect are normal. Speech and behavior are normal.  ____________________________________________   LABS (all labs ordered are listed, but only abnormal results are displayed)  Results for orders placed or performed during the hospital encounter of 12/18/18 (from the past 24 hour(s))  CBC with Differential/Platelet     Status: Abnormal   Collection Time: 12/18/18  9:42 PM  Result Value Ref Range  WBC 10.2 4.0 - 10.5 K/uL   RBC 4.05 3.87 - 5.11 MIL/uL   Hemoglobin 12.4 12.0 - 15.0 g/dL   HCT 39.4 36.0 - 46.0 %   MCV 97.3 80.0 - 100.0 fL   MCH 30.6 26.0 - 34.0 pg   MCHC 31.5 30.0 - 36.0 g/dL   RDW 14.6 11.5 - 15.5 %   Platelets 216 150 - 400 K/uL   nRBC 0.0 0.0 - 0.2 %   Neutrophils Relative % 93 %   Neutro Abs 9.5 (H) 1.7 - 7.7 K/uL   Lymphocytes Relative 5 %   Lymphs Abs 0.5 (L) 0.7 - 4.0 K/uL   Monocytes Relative 0 %   Monocytes Absolute 0.0 (L) 0.1 - 1.0 K/uL   Eosinophils Relative 1 %   Eosinophils Absolute 0.1 0.0 - 0.5 K/uL   Basophils Relative 0 %   Basophils Absolute 0.0 0.0 - 0.1 K/uL   Immature Granulocytes 1 %   Abs Immature Granulocytes 0.05 0.00 - 0.07 K/uL  Comprehensive metabolic panel     Status: Abnormal   Collection Time: 12/18/18  9:42 PM  Result Value Ref Range   Sodium 139 135 - 145 mmol/L   Potassium 3.7 3.5 - 5.1 mmol/L   Chloride 105 98 - 111 mmol/L   CO2 22 22 - 32  mmol/L   Glucose, Bld 138 (H) 70 - 99 mg/dL   BUN 9 8 - 23 mg/dL   Creatinine, Ser 0.86 0.44 - 1.00 mg/dL   Calcium 8.3 (L) 8.9 - 10.3 mg/dL   Total Protein 6.8 6.5 - 8.1 g/dL   Albumin 3.2 (L) 3.5 - 5.0 g/dL   AST 20 15 - 41 U/L   ALT 9 0 - 44 U/L   Alkaline Phosphatase 71 38 - 126 U/L   Total Bilirubin 0.7 0.3 - 1.2 mg/dL   GFR calc non Af Amer >60 >60 mL/min   GFR calc Af Amer >60 >60 mL/min   Anion gap 12 5 - 15  Group A Strep by PCR ([facility] Only)     Status: None   Collection Time: 12/18/18 10:23 PM  Result Value Ref Range   Group A Strep by PCR NOT DETECTED NOT DETECTED   ____________________________________________  EKG My review and personal interpretation at Time: 21:35   Indication: tachycardia  Rate: 145  Rhythm: afib with rvr Axis: normal Other: normal intervals, no stemi, nonsecpfic rate dependent changes ____________________________________________  RADIOLOGY   ____________________________________________   PROCEDURES  Procedure(s) performed:  Procedures    Critical Care performed: yes ____________________________________________   INITIAL IMPRESSION / ASSESSMENT AND PLAN / ED COURSE  Pertinent labs & imaging results that were available during my care of the patient were reviewed by me and considered in my medical decision making (see chart for details).   DDX: Dysrhythmia, dehydration, strep throat, anaphylaxis,   Sheryl Michael is a 85 y.o. who presents to the ED with symptoms as described above.  She is currently protecting her airway.  Symptoms seem to have gotten much better after Benadryl.  SJS is on the differential but I do not see any evidence of peer mucosal involvement and would not expected to happen so rapidly after first dose of Bactrim.  She does have findings suggesting pharyngitis with right tonsillar fullness but no uvular shift and it is only minimally more prominent than the left therefore do not feel that CT imaging or I&D clinically  indicated at this time.  We will give IV antibiotics  and IV Decadron.  She is also noted to be in A. fib with RVR.  We will give IV fluids as well as initiate Cardizem drip.  Clinical Course as of Dec 18 2326  Tue Dec 18, 2018  2313 On reassessment patient does not have any worsening swelling in the neck.  She does have slightly asymmetric fullness but the uvula is midline at this time.  She is protecting her airway.  I will give Decadron as well as IV antibiotics for sore throat.  We will continue with Cardizem drip for A. fib with RVR.  Have discussed with the patient and available family all diagnostics and treatments performed thus far and all questions were answered to the best of my ability. The patient demonstrates understanding and agreement with plan.    [PR]    Clinical Course User Index [PR] Merlyn Lot, MD     As part of my medical decision making, I reviewed the following data within the [facility] notes reviewed and incorporated, Labs reviewed, notes from prior ED visits.   ____________________________________________   FINAL CLINICAL IMPRESSION(S) / ED DIAGNOSES  Final diagnoses:  Atrial fibrillation with RVR (H[facility])  Sore throat      NEW MEDICATIONS STARTED DURING THIS VISIT:  New Prescriptions   No medications on file     Note:  This document was prepared using Dragon voice recognition software and may include unintentional dictation errors.    Merlyn Lot, MD 12/18/18 2328

## 2018-12-19 ENCOUNTER — Other Ambulatory Visit: Payer: Self-pay

## 2018-12-19 DIAGNOSIS — T7840XA Allergy, unspecified, initial encounter: Secondary | ICD-10-CM | POA: Diagnosis present

## 2018-12-19 DIAGNOSIS — J020 Streptococcal pharyngitis: Secondary | ICD-10-CM | POA: Diagnosis present

## 2018-12-19 LAB — CBC
HCT: 35.2 % — ABNORMAL LOW (ref 36.0–46.0)
Hemoglobin: 11.6 g/dL — ABNORMAL LOW (ref 12.0–15.0)
MCH: 31.7 pg (ref 26.0–34.0)
MCHC: 33 g/dL (ref 30.0–36.0)
MCV: 96.2 fL (ref 80.0–100.0)
Platelets: 202 10*3/uL (ref 150–400)
RBC: 3.66 MIL/uL — ABNORMAL LOW (ref 3.87–5.11)
RDW: 14.7 % (ref 11.5–15.5)
WBC: 19.1 10*3/uL — ABNORMAL HIGH (ref 4.0–10.5)
nRBC: 0 % (ref 0.0–0.2)

## 2018-12-19 LAB — BASIC METABOLIC PANEL
Anion gap: 7 (ref 5–15)
BUN: 10 mg/dL (ref 8–23)
CO2: 23 mmol/L (ref 22–32)
Calcium: 8 mg/dL — ABNORMAL LOW (ref 8.9–10.3)
Chloride: 107 mmol/L (ref 98–111)
Creatinine, Ser: 1.08 mg/dL — ABNORMAL HIGH (ref 0.44–1.00)
GFR calc Af Amer: 54 mL/min — ABNORMAL LOW (ref >60)
GFR, EST NON AFRICAN AMERICAN: 47 mL/min — AB (ref >60)
Glucose, Bld: 193 mg/dL — ABNORMAL HIGH (ref 70–99)
POTASSIUM: 4.3 mmol/L (ref 3.5–5.1)
Sodium: 137 mmol/L (ref 135–145)

## 2018-12-19 MED ORDER — ESCITALOPRAM OXALATE 10 MG PO TABS
10.0000 mg | ORAL_TABLET | Freq: Every day | ORAL | Status: DC
  Administered 2018-12-19 – 2018-12-20 (×2): 10 mg via ORAL
  Filled 2018-12-19 (×2): qty 1

## 2018-12-19 MED ORDER — OMEPRAZOLE MAGNESIUM 20 MG PO TBEC: 20.0000 mg | DELAYED_RELEASE_TABLET | Freq: Every day | ORAL | Status: DC

## 2018-12-19 MED ORDER — DIPHENHYDRAMINE HCL 50 MG/ML IJ SOLN: 25.0000 mg | Freq: Four times a day (QID) | INTRAMUSCULAR | Status: DC | PRN

## 2018-12-19 MED ORDER — APIXABAN 2.5 MG PO TABS
2.5000 mg | ORAL_TABLET | Freq: Two times a day (BID) | ORAL | Status: DC
  Administered 2018-12-19 – 2018-12-20 (×3): 2.5 mg via ORAL
  Filled 2018-12-19 (×4): qty 1

## 2018-12-19 MED ORDER — ACETAMINOPHEN 650 MG RE SUPP
650.0000 mg | Freq: Four times a day (QID) | RECTAL | Status: DC | PRN
  Filled 2018-12-19: qty 1

## 2018-12-19 MED ORDER — DILTIAZEM HCL ER COATED BEADS 120 MG PO CP24
120.0000 mg | ORAL_CAPSULE | Freq: Every day | ORAL | Status: DC
  Administered 2018-12-19 – 2018-12-20 (×2): 120 mg via ORAL
  Filled 2018-12-19 (×2): qty 1

## 2018-12-19 MED ORDER — LEVOTHYROXINE SODIUM 50 MCG PO TABS
50.0000 ug | ORAL_TABLET | Freq: Every day | ORAL | Status: DC
  Administered 2018-12-19 – 2018-12-20 (×2): 50 ug via ORAL
  Filled 2018-12-19 (×3): qty 1

## 2018-12-19 MED ORDER — ACETAMINOPHEN 325 MG PO TABS
650.0000 mg | ORAL_TABLET | Freq: Four times a day (QID) | ORAL | Status: DC | PRN
  Filled 2018-12-19: qty 2

## 2018-12-19 MED ORDER — ONDANSETRON HCL 4 MG/2ML IJ SOLN: 4.0000 mg | Freq: Four times a day (QID) | INTRAMUSCULAR | Status: DC | PRN

## 2018-12-19 MED ORDER — PREDNISONE 20 MG PO TABS
20.0000 mg | ORAL_TABLET | Freq: Every day | ORAL | Status: AC
  Administered 2018-12-20: 20 mg via ORAL
  Filled 2018-12-19: qty 1

## 2018-12-19 MED ORDER — PANTOPRAZOLE SODIUM 40 MG PO TBEC
40.0000 mg | DELAYED_RELEASE_TABLET | Freq: Every day | ORAL | Status: DC
  Administered 2018-12-19 – 2018-12-20 (×2): 40 mg via ORAL
  Filled 2018-12-19 (×2): qty 1

## 2018-12-19 MED ORDER — CLINDAMYCIN HCL 150 MG PO CAPS
300.0000 mg | ORAL_CAPSULE | Freq: Three times a day (TID) | ORAL | Status: DC
  Administered 2018-12-19 – 2018-12-20 (×2): 300 mg via ORAL
  Filled 2018-12-19 (×6): qty 2

## 2018-12-19 MED ORDER — PREDNISONE 20 MG PO TABS: 40.0000 mg | ORAL_TABLET | ORAL | Status: DC

## 2018-12-19 MED ORDER — ONDANSETRON HCL 4 MG PO TABS
4.0000 mg | ORAL_TABLET | Freq: Four times a day (QID) | ORAL | Status: DC | PRN
  Filled 2018-12-19: qty 1

## 2018-12-19 MED ORDER — DILTIAZEM HCL 100 MG IV SOLR: 5.0000 mg/h | INTRAVENOUS | Status: DC

## 2018-12-19 MED ORDER — CLINDAMYCIN PHOSPHATE 300 MG/50ML IV SOLN
300.0000 mg | Freq: Three times a day (TID) | INTRAVENOUS | Status: DC
  Administered 2018-12-19 (×2): 300 mg via INTRAVENOUS
  Filled 2018-12-19 (×3): qty 50

## 2018-12-19 NOTE — Consult Note (Signed)
Reason for Consult: Atrial fibrillation lung mass generalized fatigue Referring Physician: Dr. Quentin Cornwall emergency room Cardiologist [facility]  Sheryl Michael is an 85 y.o. female.  HPI: Patient 85 year old history of atrial fibrillation on Eliquis recently underwent bronchoscopy for lung mass patient needed bronchial washings and lavage the area was not amenable to biopsy patient is been on Bactrim for posterior tract infection.  Continue Cardizem for rate control Eliquis for anticoagulation patient denies any bleeding feels much improved here for routine follow-up  Past Medical History:  Diagnosis Date  . Arthritis   . Atrial fibrillation ([place])   . Breast cancer ([place]) 2006   right breast ca with lumpectomy and rad tx  . Colon polyp   . Cystocele   . Depression   . Female bladder prolapse   . GERD (gastroesophageal reflux disease)   . Goiter   . Hyperlipemia   . Hypothyroidism   . Osteoporosis   . Pneumonia   . Procidentia of uterus   . Recurrent UTI   . Reflux   . TIA (transient ischemic attack)   . Vaginal atrophy     Past Surgical History:  Procedure Laterality Date  . APPENDECTOMY    . BREAST BIOPSY Right 2006   breast cancer  . BREAST SURGERY Right    lumpectomy  . CATARACT EXTRACTION W/ INTRAOCULAR LENS  IMPLANT, BILATERAL    . COLONOSCOPY    . CYSTOCELE REPAIR N/A 12/21/2015   Procedure: ANTERIOR REPAIR (CYSTOCELE);  Surgeon: Brayton Mars, MD;  Location: [facility] ORS;  Service: Gynecology;  Laterality: N/A;  . DILATION AND CURETTAGE OF UTERUS    . ELECTROMAGNETIC NAVIGATION BROCHOSCOPY Right 12/13/2018   Procedure: ELECTROMAGNETIC NAVIGATION BRONCHOSCOPY;  Surgeon: Flora Lipps, MD;  Location: [facility] ORS;  Service: Cardiopulmonary;  Laterality: Right;  . EYE SURGERY    . VAGINAL HYSTERECTOMY Bilateral 12/21/2015   Procedure: TVH BSO;  Surgeon: Brayton Mars, MD;  Location: [facility] ORS;  Service: Gynecology;  Laterality: Bilateral;    Family History  Problem  Relation Age of Onset  . Diabetes Sister   . Breast cancer Sister        late 27's  . Diabetes Brother   . Diabetes Sister     Social History:  reports that she has never smoked. She has never used smokeless tobacco. She reports that she does not drink alcohol or use drugs.  Allergies:  Allergies  Allergen Reactions  . Bactrim [Sulfamethoxazole-Trimethoprim] Hives  . Iodinated Diagnostic Agents Anaphylaxis  . Codeine Nausea And Vomiting  . Erythromycin Ethylsuccinate Other (See Comments)    Unknown   . Phenobarbital Other (See Comments)    Feeling funny, nervous  . Ciprofloxacin Rash  . Penicillin V Potassium Itching and Rash    Has patient had a PCN reaction causing immediate rash, facial/tongue/throat swelling, SOB or lightheadedness with hypotension: No Has patient had a PCN reaction causing severe rash involving mucus membranes or skin necrosis: No Has patient had a PCN reaction that required hospitalization: No Has patient had a PCN reaction occurring within the last 10 years: No If all of the above answers are "NO", then may proceed with Cephalosporin use.     Medications: I have reviewed the patient's current medications.  Results for orders placed or performed during the hospital encounter of 12/18/18 (from the past 48 hour(s))  CBC with Differential/Platelet     Status: Abnormal   Collection Time: 12/18/18  9:42 PM  Result Value Ref Range   WBC 10.2 4.0 -  10.5 K/uL   RBC 4.05 3.87 - 5.11 MIL/uL   Hemoglobin 12.4 12.0 - 15.0 g/dL   HCT 39.4 36.0 - 46.0 %   MCV 97.3 80.0 - 100.0 fL   MCH 30.6 26.0 - 34.0 pg   MCHC 31.5 30.0 - 36.0 g/dL   RDW 14.6 11.5 - 15.5 %   Platelets 216 150 - 400 K/uL   nRBC 0.0 0.0 - 0.2 %   Neutrophils Relative % 93 %   Neutro Abs 9.5 (H) 1.7 - 7.7 K/uL   Lymphocytes Relative 5 %   Lymphs Abs 0.5 (L) 0.7 - 4.0 K/uL   Monocytes Relative 0 %   Monocytes Absolute 0.0 (L) 0.1 - 1.0 K/uL   Eosinophils Relative 1 %   Eosinophils Absolute  0.1 0.0 - 0.5 K/uL   Basophils Relative 0 %   Basophils Absolute 0.0 0.0 - 0.1 K/uL   Immature Granulocytes 1 %   Abs Immature Granulocytes 0.05 0.00 - 0.07 K/uL    Comment: Performed at [facility], [address] [place], [place] [address]  Comprehensive metabolic panel     Status: Abnormal   Collection Time: 12/18/18  9:42 PM  Result Value Ref Range   Sodium 139 135 - 145 mmol/L   Potassium 3.7 3.5 - 5.1 mmol/L   Chloride 105 98 - 111 mmol/L   CO2 22 22 - 32 mmol/L   Glucose, Bld 138 (H) 70 - 99 mg/dL   BUN 9 8 - 23 mg/dL   Creatinine, Ser 0.86 0.44 - 1.00 mg/dL   Calcium 8.3 (L) 8.9 - 10.3 mg/dL   Total Protein 6.8 6.5 - 8.1 g/dL   Albumin 3.2 (L) 3.5 - 5.0 g/dL   AST 20 15 - 41 U/L   ALT 9 0 - 44 U/L   Alkaline Phosphatase 71 38 - 126 U/L   Total Bilirubin 0.7 0.3 - 1.2 mg/dL   GFR calc non Af Amer >60 >60 mL/min   GFR calc Af Amer >60 >60 mL/min   Anion gap 12 5 - 15    Comment: Performed at [facility], [address] [place], [place] [address]  Group A Strep by PCR [facility] Only)     Status: None   Collection Time: 12/18/18 10:23 PM  Result Value Ref Range   Group A Strep by PCR NOT DETECTED NOT DETECTED    Comment: Performed at [facility], [address] [place], [place] [address]  Basic metabolic panel     Status: Abnormal   Collection Time: 12/19/18  5:08 AM  Result Value Ref Range   Sodium 137 135 - 145 mmol/L   Potassium 4.3 3.5 - 5.1 mmol/L   Chloride 107 98 - 111 mmol/L   CO2 23 22 - 32 mmol/L   Glucose, Bld 193 (H) 70 - 99 mg/dL   BUN 10 8 - 23 mg/dL   Creatinine, Ser 1.08 (H) 0.44 - 1.00 mg/dL   Calcium 8.0 (L) 8.9 - 10.3 mg/dL   GFR calc non Af Amer 47 (L) >60 mL/min   GFR calc Af Amer 54 (L) >60 mL/min   Anion gap 7 5 - 15    Comment: Performed at [facility], [address] [place], [place] [address]  CBC     Status: Abnormal   Collection Time: 12/19/18  5:08 AM  Result Value Ref Range   WBC 19.1 (H)  4.0 - 10.5 K/uL   RBC 3.66 (L) 3.87 -  5.11 MIL/uL   Hemoglobin 11.6 (L) 12.0 - 15.0 g/dL   HCT 35.2 (L) 36.0 - 46.0 %   MCV 96.2 80.0 - 100.0 fL   MCH 31.7 26.0 - 34.0 pg   MCHC 33.0 30.0 - 36.0 g/dL   RDW 14.7 11.5 - 15.5 %   Platelets 202 150 - 400 K/uL   nRBC 0.0 0.0 - 0.2 %    Comment: Performed at [facility], [address], [place], [place] [address]    No results found.  Review of Systems  Constitutional: Positive for diaphoresis and malaise/fatigue.  HENT: Positive for congestion.   Eyes: Negative.   Respiratory: Positive for shortness of breath.   Cardiovascular: Positive for palpitations.  Gastrointestinal: Negative.   Genitourinary: Negative.   Musculoskeletal: Positive for myalgias.  Skin: Negative.   Neurological: Positive for weakness.  Endo/Heme/Allergies: Negative.   Psychiatric/Behavioral: Negative.    Blood pressure (!) 113/56, pulse 69, resp. rate 18, SpO2 97 %. Physical Exam  Nursing note and vitals reviewed. Constitutional: She is oriented to person, place, and time. She appears well-developed and well-nourished.  HENT:  Head: Normocephalic and atraumatic.  Eyes: Pupils are equal, round, and reactive to light. Conjunctivae and EOM are normal.  Neck: Normal range of motion. Neck supple.  Cardiovascular: Normal rate, regular rhythm and normal heart sounds.  Respiratory: Effort normal and breath sounds normal.  GI: Soft. Bowel sounds are normal.  Musculoskeletal: Normal range of motion.  Neurological: She is alert and oriented to person, place, and time. She has normal reflexes.  Skin: Skin is warm and dry.  Psychiatric: She has a normal mood and affect.    Assessment/Plan: Atrial fibrillation GERD Lung mass Status post bronchoscopy Mild depression GERD Hyperlipidemia . Plan Continue current medical therapy for atrial fibrillation patient now in sinus Resume anticoagulation Eliquis 2.5 twice a day Cardizem for rate  control Continue antibiotic therapy with doxycycline Maintain Lexapro for depression symptoms Agree with omeprazole for reflux symptoms Have the patient follow-up with cardiology as an outpatient  Bethania Schlotzhauer D Remi Lopata 12/19/2018, 3:04 PM

## 2018-12-19 NOTE — ED Notes (Signed)
ED TO INPATIENT HANDOFF REPORT  ED Nurse Name and Phone #: [phone]  S Name/Age/Gender Sheryl Michael 85 y.o. female Room/Bed: ED37A/ED37A  Code Status   Code Status: Full Code  Home/SNF/Other Home Patient oriented to: self, place, time and situation Is this baseline? Yes   Triage Complete: Triage complete  Chief Complaint Medication Reaction  Triage Note PT to ED via EMS for possible allergic reaction to Bactrim. PT had 1 pill and started to have rash and sore throat. Family gave 125mg  of benadryl. Per EMS VSS, was given 4mg  of zofran in route. Pt A&OX4 , RR even and unlabored.    Allergies Allergies  Allergen Reactions  . Bactrim [Sulfamethoxazole-Trimethoprim] Hives  . Iodinated Diagnostic Agents Anaphylaxis  . Codeine Nausea And Vomiting  . Erythromycin Ethylsuccinate Other (See Comments)    Unknown   . Phenobarbital Other (See Comments)    Feeling funny, nervous  . Ciprofloxacin Rash  . Penicillin V Potassium Itching and Rash    Has patient had a PCN reaction causing immediate rash, facial/tongue/throat swelling, SOB or lightheadedness with hypotension: No Has patient had a PCN reaction causing severe rash involving mucus membranes or skin necrosis: No Has patient had a PCN reaction that required hospitalization: No Has patient had a PCN reaction occurring within the last 10 years: No If all of the above answers are "NO", then may proceed with Cephalosporin use.     Level of Care/Admitting Diagnosis ED Disposition    ED Disposition Condition [facility] Area: [place] [[id number]]  Level of Care: Telemetry [5]  Diagnosis: Atrial fibrillation with RVR [facility]) [053976]  Admitting Physician: Lance Coon [[id number]]  Attending Physician: Lance Coon [phone]  Bed request comments: 2a  PT Class (Do Not Modify): Observation [104]  PT Acc Code (Do Not Modify): Observation [10022]       B Medical/Surgery History Past  Medical History:  Diagnosis Date  . Arthritis   . Atrial fibrillation ([place])   . Breast cancer ([place]) 2006   right breast ca with lumpectomy and rad tx  . Colon polyp   . Cystocele   . Depression   . Female bladder prolapse   . GERD (gastroesophageal reflux disease)   . Goiter   . Hyperlipemia   . Hypothyroidism   . Osteoporosis   . Pneumonia   . Procidentia of uterus   . Recurrent UTI   . Reflux   . TIA (transient ischemic attack)   . Vaginal atrophy    Past Surgical History:  Procedure Laterality Date  . APPENDECTOMY    . BREAST BIOPSY Right 2006   breast cancer  . BREAST SURGERY Right    lumpectomy  . CATARACT EXTRACTION W/ INTRAOCULAR LENS  IMPLANT, BILATERAL    . COLONOSCOPY    . CYSTOCELE REPAIR N/A 12/21/2015   Procedure: ANTERIOR REPAIR (CYSTOCELE);  Surgeon: Brayton Mars, MD;  Location: [facility] ORS;  Service: Gynecology;  Laterality: N/A;  . DILATION AND CURETTAGE OF UTERUS    . ELECTROMAGNETIC NAVIGATION BROCHOSCOPY Right 12/13/2018   Procedure: ELECTROMAGNETIC NAVIGATION BRONCHOSCOPY;  Surgeon: Flora Lipps, MD;  Location: [facility] ORS;  Service: Cardiopulmonary;  Laterality: Right;  . EYE SURGERY    . VAGINAL HYSTERECTOMY Bilateral 12/21/2015   Procedure: TVH BSO;  Surgeon: Brayton Mars, MD;  Location: [facility] ORS;  Service: Gynecology;  Laterality: Bilateral;     A IV Location/Drains/Wounds Patient Lines/Drains/Airways Status   Active Line/Drains/Airways    Name:  Placement date:   Placement time:   Site:   Days:   Peripheral IV 12/18/18 Left Antecubital   12/18/18    2132    Antecubital   1   Peripheral IV 12/19/18 Left Forearm   12/19/18    0006    Forearm   less than 1   Incision (Closed) 12/21/15 Vagina Other (Comment)   12/21/15    0840     1094          Intake/Output Last 24 hours  Intake/Output Summary (Last 24 hours) at 12/19/2018 2116 Last data filed at 12/19/2018 3875 Gross per 24 hour  Intake 704.37 ml  Output -  Net 704.37 ml     Labs/Imaging Results for orders placed or performed during the hospital encounter of 12/18/18 (from the past 48 hour(s))  CBC with Differential/Platelet     Status: Abnormal   Collection Time: 12/18/18  9:42 PM  Result Value Ref Range   WBC 10.2 4.0 - 10.5 K/uL   RBC 4.05 3.87 - 5.11 MIL/uL   Hemoglobin 12.4 12.0 - 15.0 g/dL   HCT 39.4 36.0 - 46.0 %   MCV 97.3 80.0 - 100.0 fL   MCH 30.6 26.0 - 34.0 pg   MCHC 31.5 30.0 - 36.0 g/dL   RDW 14.6 11.5 - 15.5 %   Platelets 216 150 - 400 K/uL   nRBC 0.0 0.0 - 0.2 %   Neutrophils Relative % 93 %   Neutro Abs 9.5 (H) 1.7 - 7.7 K/uL   Lymphocytes Relative 5 %   Lymphs Abs 0.5 (L) 0.7 - 4.0 K/uL   Monocytes Relative 0 %   Monocytes Absolute 0.0 (L) 0.1 - 1.0 K/uL   Eosinophils Relative 1 %   Eosinophils Absolute 0.1 0.0 - 0.5 K/uL   Basophils Relative 0 %   Basophils Absolute 0.0 0.0 - 0.1 K/uL   Immature Granulocytes 1 %   Abs Immature Granulocytes 0.05 0.00 - 0.07 K/uL    Comment: Performed at [facility], [address] [place], [place] [address]  Comprehensive metabolic panel     Status: Abnormal   Collection Time: 12/18/18  9:42 PM  Result Value Ref Range   Sodium 139 135 - 145 mmol/L   Potassium 3.7 3.5 - 5.1 mmol/L   Chloride 105 98 - 111 mmol/L   CO2 22 22 - 32 mmol/L   Glucose, Bld 138 (H) 70 - 99 mg/dL   BUN 9 8 - 23 mg/dL   Creatinine, Ser 0.86 0.44 - 1.00 mg/dL   Calcium 8.3 (L) 8.9 - 10.3 mg/dL   Total Protein 6.8 6.5 - 8.1 g/dL   Albumin 3.2 (L) 3.5 - 5.0 g/dL   AST 20 15 - 41 U/L   ALT 9 0 - 44 U/L   Alkaline Phosphatase 71 38 - 126 U/L   Total Bilirubin 0.7 0.3 - 1.2 mg/dL   GFR calc non Af Amer >60 >60 mL/min   GFR calc Af Amer >60 >60 mL/min   Anion gap 12 5 - 15    Comment: Performed at [facility], [place], [place] [address]  Group A Strep by PCR [facility] Only)     Status: None   Collection Time: 12/18/18 10:23 PM  Result Value Ref Range   Group A Strep by PCR NOT  DETECTED NOT DETECTED    Comment: Performed at [facility], [address] [place], [place] [address]  Basic metabolic panel  Status: Abnormal   Collection Time: 12/19/18  5:08 AM  Result Value Ref Range   Sodium 137 135 - 145 mmol/L   Potassium 4.3 3.5 - 5.1 mmol/L   Chloride 107 98 - 111 mmol/L   CO2 23 22 - 32 mmol/L   Glucose, Bld 193 (H) 70 - 99 mg/dL   BUN 10 8 - 23 mg/dL   Creatinine, Ser 1.08 (H) 0.44 - 1.00 mg/dL   Calcium 8.0 (L) 8.9 - 10.3 mg/dL   GFR calc non Af Amer 47 (L) >60 mL/min   GFR calc Af Amer 54 (L) >60 mL/min   Anion gap 7 5 - 15    Comment: Performed at [facility], [address] [place], [place] [address]  CBC     Status: Abnormal   Collection Time: 12/19/18  5:08 AM  Result Value Ref Range   WBC 19.1 (H) 4.0 - 10.5 K/uL   RBC 3.66 (L) 3.87 - 5.11 MIL/uL   Hemoglobin 11.6 (L) 12.0 - 15.0 g/dL   HCT 35.2 (L) 36.0 - 46.0 %   MCV 96.2 80.0 - 100.0 fL   MCH 31.7 26.0 - 34.0 pg   MCHC 33.0 30.0 - 36.0 g/dL   RDW 14.7 11.5 - 15.5 %   Platelets 202 150 - 400 K/uL   nRBC 0.0 0.0 - 0.2 %    Comment: Performed at [facility], [address] [place], [place] [address]   No results found.  Pending Labs Unresulted Labs (From admission, onward)   None      Vitals/Pain Today's Vitals   12/19/18 1824 12/19/18 1930 12/19/18 2000 12/19/18 2030  BP: (!) 112/56 (!) 123/55 133/60 (!) 114/52  Pulse: 60 70 77 62  Resp: 16   16  SpO2: 98% 99% 99% 98%  PainSc: 0-No pain       Isolation Precautions No active isolations  Medications Medications  diltiazem (CARDIZEM) 100 mg in dextrose 5 % 100 mL (1 mg/mL) infusion (5 mg/hr Intravenous Not Given 12/19/18 0433)  diltiazem (CARDIZEM CD) 24 hr capsule 120 mg (120 mg Oral Given 12/19/18 1017)  escitalopram (LEXAPRO) tablet 10 mg (10 mg Oral Given 12/19/18 1017)  levothyroxine (SYNTHROID, LEVOTHROID) tablet 50 mcg (50 mcg Oral Given 12/19/18 0748)  apixaban (ELIQUIS) tablet 2.5 mg  (2.5 mg Oral Given 12/19/18 1017)  diphenhydrAMINE (BENADRYL) injection 25 mg (has no administration in time range)  acetaminophen (TYLENOL) tablet 650 mg (has no administration in time range)    Or  acetaminophen (TYLENOL) suppository 650 mg (has no administration in time range)  ondansetron (ZOFRAN) tablet 4 mg (has no administration in time range)    Or  ondansetron (ZOFRAN) injection 4 mg (has no administration in time range)  pantoprazole (PROTONIX) EC tablet 40 mg (40 mg Oral Given 12/19/18 1017)  clindamycin (CLEOCIN) capsule 300 mg (300 mg Oral Not Given 12/19/18 1412)  predniSONE (DELTASONE) tablet 20 mg (has no administration in time range)  magnesium sulfate IVPB 2 g 50 mL (0 g Intravenous Stopped 12/18/18 2328)  sodium chloride 0.9 % bolus 500 mL (0 mLs Intravenous Stopped 12/19/18 0338)  diltiazem (CARDIZEM) injection 10 mg (10 mg Intravenous Given 12/18/18 2217)  diltiazem (CARDIZEM) tablet 60 mg (60 mg Oral Given 12/18/18 2327)  clindamycin (CLEOCIN) IVPB 600 mg (0 mg Intravenous Stopped 12/19/18 0221)  dexamethasone (DECADRON) injection 10 mg (10 mg Intravenous Given 12/18/18 2335)  lidocaine (XYLOCAINE) 2 % viscous mouth solution 15 mL (15 mLs Mouth/Throat Given 12/18/18  2337)    Mobility walks with device Low fall risk   Focused Assessments Cardiac Assessment Handoff:    Lab Results  Component Value Date   CKTOTAL 30 04/15/2013   CKMB 0.7 04/15/2013   TROPONINI <0.03 05/31/2017   No results found for: DDIMER Does the Patient currently have chest pain? No     R Recommendations: See Admitting Provider Note  Report given to:   Additional Notes: Pt currently using a hospital provided walker.

## 2018-12-19 NOTE — Care Management Obs Status (Signed)
Rafael Hernandez NOTIFICATION   Patient Details  Name: Sheryl Michael MRN: [id number] Date of Birth: 06-10-1931   Medicare Observation Status Notification Given:  Yes    Shelbie Hutching, RN 12/19/2018, 5:05 PM

## 2018-12-19 NOTE — H&P (Signed)
Sheryl Michael NAME: Sheryl Michael    MR#:  [id number]  DATE OF BIRTH:  01-14-1931  DATE OF ADMISSION:  12/18/2018  PRIMARY CARE PHYSICIAN: Idelle Crouch, MD   REQUESTING/REFERRING PHYSICIAN: Quentin Cornwall, MD  CHIEF COMPLAINT:   Chief Complaint  Patient presents with  . Allergic Reaction    HISTORY OF PRESENT ILLNESS:  Sheryl Michael  is a 85 y.o. female who presents with chief complaint as above.  Patient presents the ED with a complaint of allergic reaction and rapid heart rate.  She had a bronchoscopy performed about 5 days ago as part of her evaluation for a lung abnormality, work-up to check for malignancy.  Malignancy was not detected.  She has had a sore throat since that time, though it got worse over the last 3 days or so.  There was some concern that she might have streptococcal infection and she was prescribed Bactrim.  She took the Bactrim and developed hives.  Here in the ED she was treated with Benadryl.  She was also noted to be in A. fib with RVR, requiring diltiazem drip.  Hospitalist were called for admission  PAST MEDICAL HISTORY:   Past Medical History:  Diagnosis Date  . Arthritis   . Atrial fibrillation ([place])   . Breast cancer ([place]) 2006   right breast ca with lumpectomy and rad tx  . Colon polyp   . Cystocele   . Depression   . Female bladder prolapse   . GERD (gastroesophageal reflux disease)   . Goiter   . Hyperlipemia   . Hypothyroidism   . Osteoporosis   . Pneumonia   . Procidentia of uterus   . Recurrent UTI   . Reflux   . TIA (transient ischemic attack)   . Vaginal atrophy      PAST SURGICAL HISTORY:   Past Surgical History:  Procedure Laterality Date  . APPENDECTOMY    . BREAST BIOPSY Right 2006   breast cancer  . BREAST SURGERY Right    lumpectomy  . CATARACT EXTRACTION W/ INTRAOCULAR LENS  IMPLANT, BILATERAL    . COLONOSCOPY    . CYSTOCELE REPAIR N/A 12/21/2015   Procedure:  ANTERIOR REPAIR (CYSTOCELE);  Surgeon: Brayton Mars, MD;  Location: [facility] ORS;  Service: Gynecology;  Laterality: N/A;  . DILATION AND CURETTAGE OF UTERUS    . ELECTROMAGNETIC NAVIGATION BROCHOSCOPY Right 12/13/2018   Procedure: ELECTROMAGNETIC NAVIGATION BRONCHOSCOPY;  Surgeon: Flora Lipps, MD;  Location: [facility] ORS;  Service: Cardiopulmonary;  Laterality: Right;  . EYE SURGERY    . VAGINAL HYSTERECTOMY Bilateral 12/21/2015   Procedure: TVH BSO;  Surgeon: Brayton Mars, MD;  Location: [facility] ORS;  Service: Gynecology;  Laterality: Bilateral;     SOCIAL HISTORY:   Social History   Tobacco Use  . Smoking status: Never Smoker  . Smokeless tobacco: Never Used  Substance Use Topics  . Alcohol use: No     FAMILY HISTORY:   Family History  Problem Relation Age of Onset  . Diabetes Sister   . Breast cancer Sister        late 56's  . Diabetes Brother   . Diabetes Sister      DRUG ALLERGIES:   Allergies  Allergen Reactions  . Iodinated Diagnostic Agents Anaphylaxis  . Bactrim [Sulfamethoxazole-Trimethoprim]   . Codeine Nausea And Vomiting  . Erythromycin Ethylsuccinate Other (See Comments)    Unknown   . Phenobarbital Other (See Comments)  Feeling funny, nervous  . Ciprofloxacin Rash  . Penicillin V Potassium Itching and Rash    Has patient had a PCN reaction causing immediate rash, facial/tongue/throat swelling, SOB or lightheadedness with hypotension: No Has patient had a PCN reaction causing severe rash involving mucus membranes or skin necrosis: No Has patient had a PCN reaction that required hospitalization: No Has patient had a PCN reaction occurring within the last 10 years: No If all of the above answers are "NO", then may proceed with Cephalosporin use.     MEDICATIONS AT HOME:   Prior to Admission medications   Medication Sig Start Date End Date Taking? Authorizing Provider  acetaminophen (TYLENOL) 325 MG tablet Take 650 mg by mouth every 6 (six)  hours as needed for moderate pain.   Yes [provider]  apixaban (ELIQUIS) 2.5 MG TABS tablet Take 1 tablet (2.5 mg total) by mouth 2 (two) times daily. 05/31/17  Yes Demetrios Loll, MD  diltiazem (CARDIZEM CD) 120 MG 24 hr capsule Take 1 capsule (120 mg total) by mouth daily. 06/01/17  Yes Demetrios Loll, MD  diphenhydrAMINE (BENADRYL) 25 MG tablet Take 25 mg by mouth every 6 (six) hours as needed.   Yes [provider]  escitalopram (LEXAPRO) 10 MG tablet Take 10 mg by mouth daily.    Yes [provider]  levothyroxine (SYNTHROID, LEVOTHROID) 50 MCG tablet Take 50 mcg by mouth daily before breakfast.   Yes [provider]  omeprazole (PRILOSEC OTC) 20 MG tablet Take 20 mg by mouth daily.    Yes [provider]  sodium chloride (OCEAN) 0.65 % SOLN nasal spray Place 1 spray into both nostrils daily as needed for congestion.   Yes [provider]  sulfamethoxazole-trimethoprim (BACTRIM DS,SEPTRA DS) 800-160 MG tablet Take 1 tablet by mouth 2 (two) times daily.  12/18/18 12/25/18 Yes [provider]  Calcium Carbonate-Vitamin D (CALTRATE 600+D PO) Take 1 tablet by mouth daily.    [provider]  doxycycline (VIBRA-TABS) 100 MG tablet Take 100 mg by mouth 2 (two) times daily. 11/28/18   [provider]  predniSONE (DELTASONE) 10 MG tablet Take 10-60 mg by mouth See admin instructions. Take 60 mg by mouth for 2 days, then 40 mg for 2 days, then 20 mg for 2 days, then 10 mg for 2 days. 11/28/18   [provider]    REVIEW OF SYSTEMS:  Review of Systems  Constitutional: Negative for chills, fever, malaise/fatigue and weight loss.  HENT: Positive for sore throat. Negative for ear pain, hearing loss and tinnitus.   Eyes: Negative for blurred vision, double vision, pain and redness.  Respiratory: Negative for cough, hemoptysis and shortness of breath.   Cardiovascular: Positive for palpitations. Negative for chest pain,  orthopnea and leg swelling.  Gastrointestinal: Negative for abdominal pain, constipation, diarrhea, nausea and vomiting.  Genitourinary: Negative for dysuria, frequency and hematuria.  Musculoskeletal: Negative for back pain, joint pain and neck pain.  Skin: Positive for rash.  Neurological: Negative for dizziness, tremors, focal weakness and weakness.  Endo/Heme/Allergies: Negative for polydipsia. Does not bruise/bleed easily.  Psychiatric/Behavioral: Negative for depression. The patient is not nervous/anxious and does not have insomnia.      VITAL SIGNS:   Vitals:   12/18/18 2200 12/18/18 2230 12/18/18 2330 12/19/18 0000  BP: 129/67 140/68 122/64 (!) 126/51  Pulse: (!) 139 (!) 152 (!) 155 (!) 157  Resp: 18 (!) 22 (!) 23 19  SpO2: 96% 95% 95% 92%  Wt Readings from Last 3 Encounters:  12/13/18 58 kg  12/04/18 57.6 kg  11/20/18 58.8 kg    PHYSICAL EXAMINATION:  Physical Exam  Vitals reviewed. Constitutional: She is oriented to person, place, and time. She appears well-developed and well-nourished. No distress.  HENT:  Head: Normocephalic and atraumatic.  Mouth/Throat: Oropharynx is clear and moist.  Eyes: Pupils are equal, round, and reactive to light. Conjunctivae and EOM are normal. No scleral icterus.  Neck: Normal range of motion. Neck supple. No JVD present. No thyromegaly present.  Cardiovascular: Intact distal pulses. Exam reveals no gallop and no friction rub.  No murmur heard. Tachycardic, irregular rhythm  Respiratory: Effort normal and breath sounds normal. No respiratory distress. She has no wheezes. She has no rales.  GI: Soft. Bowel sounds are normal. She exhibits no distension. There is no abdominal tenderness.  Musculoskeletal: Normal range of motion.        General: No edema.     Comments: No arthritis, no gout  Lymphadenopathy:    She has no cervical adenopathy.  Neurological: She is alert and oriented to person, place, and time. No cranial nerve deficit.   No dysarthria, no aphasia  Skin: Skin is warm and dry. No rash noted. No erythema.  Psychiatric: She has a normal mood and affect. Her behavior is normal. Judgment and thought content normal.    LABORATORY PANEL:   CBC Recent Labs  Lab 12/18/18 2142  WBC 10.2  HGB 12.4  HCT 39.4  PLT 216   ------------------------------------------------------------------------------------------------------------------  Chemistries  Recent Labs  Lab 12/18/18 2142  NA 139  K 3.7  CL 105  CO2 22  GLUCOSE 138*  BUN 9  CREATININE 0.86  CALCIUM 8.3*  AST 20  ALT 9  ALKPHOS 71  BILITOT 0.7   ------------------------------------------------------------------------------------------------------------------  Cardiac Enzymes No results for input(s): TROPONINI in the last 168 hours. ------------------------------------------------------------------------------------------------------------------  RADIOLOGY:  No results found.  EKG:   Orders placed or performed during the hospital encounter of 12/18/18  . EKG 12-Lead  . EKG 12-Lead    IMPRESSION AND PLAN:  Principal Problem:   Atrial fibrillation with RVR ([facility]) -patient was given several doses of IV Cardizem, and then placed on a Cardizem drip.  Her heart rate is still not controlled to less than 110, though it is slowly coming down.  Cardiology consult Active Problems:   Strep throat -patient was rapid strep negative in the ED, though she does have significant erythema and ED provider reported seeing some purulence in her throat.  She has neutrophilia on her differential.  She was given clindamycin in the ED, will continue this antibiotic for now, as well as burst dose steroid   Allergic reaction -hives have resolved at this time, PRN Benadryl for any recurrence of allergic reaction   GERD (gastroesophageal reflux disease) -home dose PPI   HLD (hyperlipidemia) -home dose antilipid   Hypothyroidism, acquired -home dose thyroid  replacement  Chart review performed and case discussed with ED provider. Labs, imaging and/or ECG reviewed by provider and discussed with patient/family. Management plans discussed with the patient and/or family.  DVT PROPHYLAXIS: Systemic anticoagulation  GI PROPHYLAXIS:  PPI   ADMISSION STATUS: Observation  CODE STATUS: Full Code Status History    Date Active Date Inactive Code Status Order ID Comments User Context   05/30/2017 2032 05/31/2017 1658 Full Code [id number]  Theodoro Grist, MD Inpatient   12/21/2015 1132 12/22/2015 1807 Full Code [id number]  Defrancesco, Alanda Slim, MD Inpatient  TOTAL TIME TAKING CARE OF THIS PATIENT: 40 minutes.   Ethlyn Daniels 12/19/2018, 12:54 AM  [facility]  Office  [phone]  CC: Primary care physician; Idelle Crouch, MD  Note:  This document was prepared using Dragon voice recognition software and may include unintentional dictation errors.

## 2018-12-19 NOTE — ED Notes (Signed)
Pt requesting medications before transport to the floor. Pharmacy made aware and reports they will send medications as soon as possible. Pt updated.   RN provided another drink and ranch for the rest of vegetables from dinner tray.

## 2018-12-19 NOTE — ED Notes (Signed)
Pt cleaned and chuck pad changed by this RN and [employer], Therapist, sports. Pt also placed on an external catheter.

## 2018-12-19 NOTE — ED Notes (Signed)
Pt ambulated with walker down the hallway and back with this RN and a family member. Pt did well with walker.

## 2018-12-19 NOTE — Progress Notes (Signed)
Admitted this morning for possible allergic reaction to Bactrim, patient developed welts all over the body after taking the Bactrim 1 time.  She was given Bactrim for possible sore throat after bronchoscopy.  Patient is seen in the emergency room for possible allergic reaction, received Benadryl at home, in the ER patient developed atrial fibrillation with RVR with heart rate up to 140 bpm, received multiple doses of IV Cardizem, this morning when I saw the patient patient denies any shortness of breath, swelling of throat, and heart rate is around 60 bpm in sinus rhythm, she denies any complaints, no rashes observed on the body, no shortness of breath, no tongue swelling. 1.  Atrial fibrillation with RVR, likely precipitated by anxiety/allergic reaction as well, family mentioned that she has some allergy reaction to Bactrim she also has history of panic attacks, anxiety.  Received Cardizem, patient has history approximately fibrillation, history of stroke before followed by Dr. call Clydene Laming, epic text message Dr. Clayborn Bigness to see the patient while she is here.  Patient right now on Cardizem 120 mg daily.  Never required Cardizem drip.  Continue apixaban 2.5 mg p.o. twice daily. 2.  Possible allergic reaction to Bactrim patient developed rash all over the body, discontinue Bactrim, started on clindamycin.  Patient strep throat negative, noted to have pus examination of throat in the emergency room, continue clindamycin for total of 3 days.

## 2018-12-19 NOTE — ED Notes (Signed)
Friends at bedside with patient. Pt remains in NAD at this time.

## 2018-12-19 NOTE — Care Management Note (Signed)
Case Management Note  Patient Details  Name: Sheryl Michael MRN: [id number] Date of Birth: [birth year]  Subjective/Objective: Patient is placed under observation for afib with RVR initially requiring IV diltazem.  Patient is from home and lives alone.  Patient is independent, drives.  PCP verified as Dr. Doy Hutching, pharmacy is [facility] in [place].  No discharge needs identified at this time. Doran Clay RN BSN Care Manager                 [phone]  Action/Plan:   Expected Discharge Date:                  Expected Discharge Plan:  Home/Self Care  In-House Referral:     Discharge planning Services     Post Acute Care Choice:    Choice offered to:     DME Arranged:    DME Agency:     HH Arranged:    [place] Agency:     Status of Service:  Completed, signed off  If discussed at [place] of Stay Meetings, dates discussed:    Additional Comments:  Shelbie Hutching, RN 12/19/2018, 5:05 PM

## 2018-12-19 NOTE — ED Notes (Signed)
Pt provided dinner tray.

## 2018-12-20 MED ORDER — CLINDAMYCIN HCL 300 MG PO CAPS: 300.0000 mg | ORAL_CAPSULE | Freq: Three times a day (TID) | ORAL | 0 refills | Status: DC

## 2018-12-20 NOTE — Discharge Summary (Signed)
Sheryl Michael, is a 85 y.o. female  DOB [birth year]  MRN [id number].  Admission date:  12/18/2018  Admitting Physician  Lance Coon, MD  Discharge Date:  12/20/2018   Primary MD  Idelle Crouch, MD  Recommendations for primary care physician for things to follow:   Follow with PCP in 1 week Follow-up with primary cardiologist Dr. Clayborn Bigness in 1 week   Admission Diagnosis  Sore throat [J02.9] Atrial fibrillation with RVR ([place]) [I48.91]   Discharge Diagnosis  Sore throat [J02.9] Atrial fibrillation with RVR ([place]) [I48.91]    Principal Problem:   Atrial fibrillation with RVR ([facility]) Active Problems:   GERD (gastroesophageal reflux disease)   HLD (hyperlipidemia)   Hypothyroidism, acquired   Strep throat   Allergic reaction      Past Medical History:  Diagnosis Date  . Arthritis   . Atrial fibrillation ([place])   . Breast cancer ([place]) 2006   right breast ca with lumpectomy and rad tx  . Colon polyp   . Cystocele   . Depression   . Female bladder prolapse   . GERD (gastroesophageal reflux disease)   . Goiter   . Hyperlipemia   . Hypothyroidism   . Osteoporosis   . Pneumonia   . Procidentia of uterus   . Recurrent UTI   . Reflux   . TIA (transient ischemic attack)   . Vaginal atrophy     Past Surgical History:  Procedure Laterality Date  . APPENDECTOMY    . BREAST BIOPSY Right 2006   breast cancer  . BREAST SURGERY Right    lumpectomy  . CATARACT EXTRACTION W/ INTRAOCULAR LENS  IMPLANT, BILATERAL    . COLONOSCOPY    . CYSTOCELE REPAIR N/A 12/21/2015   Procedure: ANTERIOR REPAIR (CYSTOCELE);  Surgeon: Brayton Mars, MD;  Location: [facility] ORS;  Service: Gynecology;  Laterality: N/A;  . DILATION AND CURETTAGE OF UTERUS    . ELECTROMAGNETIC NAVIGATION BROCHOSCOPY Right 12/13/2018   Procedure:  ELECTROMAGNETIC NAVIGATION BRONCHOSCOPY;  Surgeon: Flora Lipps, MD;  Location: [facility] OR[facility];  Service: Cardiopulmonary;  Laterality: Right;  . EYE SURGERY    . VAGINAL HYSTERECTOMY Bilateral 12/21/2015   Procedure: TVH BSO;  Surgeon: Brayton Mars, MD;  Location: [facility] ORS;  Service: Gynecology;  Laterality: Bilateral;       History of present illness and  Hospital Course:     Kindly see H&P for history of present illness and admission details, please review complete Labs, Consult reports and Test reports for all details in brief  HPI  from the history and physical done on the day of admission  85 year old female patient with history of chronic recently underwent bronchoscopy for possible lung mass came in because of possible allergic reaction.  Patient complaint of sore throat after bronchoscopy for which she was given Bactrim, patient did 1 dose of Bactrim double up with rash all over the body concerning days patient's family brought her, she is admitted allergy reaction, developed atrial fibrillation with RVR in the emergency room.  Hospital Course  1 allergic reaction to Bactrim, patient developed hives all over the body as per family, received Benadryl at home, admitted to hospital for observation, received a Decadron, patient did not have any tongue swelling, respiratory difficulty, her hives disappeared, she is monitored for 24 hours in the hospital, absolutely has no symptoms and stable for discharge, she does not need any more prednisone.  And also she is able to eat regular food he can use  Benadryl as needed if she gets rash or symptoms of rash and allergy but I do not think she needs any Benadryl, prednisone at this time. 2.  Atrial fibrillation with RVR, patient heart rate was 150 in the emergency room received Cardizem IV push, started on Cardizem drip, she never required Cardizem drip, patient heart rate improved on its own, patient continued on home dose Cardizem CD at 120 mg  daily, patient has chronic A. fib, seen by cardiology Dr. Purvis Sheffield, she can continue Eliquis 2.5 mg p.o. twice daily and continue her Cardizem.  Did not have any further yes, stable for discharge. 3 .sore throat after bronchoscopy, patient had bronchoscopy for possible lung mass, follow-up with Dr.kasa, looks like her biopsy is negative for malignancy group B strep negative from throat, patient allergic to Bactrim, use clindamycin for 3 days patient absolutely has no symptoms, sore throat is better now.  Discharge home, follow-up with Dr. Clayborn Bigness ,Dr.kasa,Dr.Pcp.  Discharge Condition: Stable  Follow UP      Discharge Instructions  and  Discharge Medications     Allergies as of 12/20/2018      Reactions   Bactrim [sulfamethoxazole-trimethoprim] Hives   Iodinated Diagnostic Agents Anaphylaxis   Codeine Nausea And Vomiting   Erythromycin Ethylsuccinate Other (See Comments)   Unknown   Phenobarbital Other (See Comments)   Feeling funny, nervous   Ciprofloxacin Rash   Penicillin V Potassium Itching, Rash   Has patient had a PCN reaction causing immediate rash, facial/tongue/throat swelling, SOB or lightheadedness with hypotension: No Has patient had a PCN reaction causing severe rash involving mucus membranes or skin necrosis: No Has patient had a PCN reaction that required hospitalization: No Has patient had a PCN reaction occurring within the last 10 years: No If all of the above answers are "NO", then may proceed with Cephalosporin use.      Medication List    STOP taking these medications   doxycycline 100 MG tablet Commonly known as:  VIBRA-TABS     TAKE these medications   acetaminophen 325 MG tablet Commonly known as:  TYLENOL Take 650 mg by mouth every 6 (six) hours as needed for moderate pain.   apixaban 2.5 MG Tabs tablet Commonly known as:  ELIQUIS Take 1 tablet (2.5 mg total) by mouth 2 (two) times daily.   CALTRATE 600+D PO Take 1 tablet by mouth daily.    clindamycin 300 MG capsule Commonly known as:  CLEOCIN Take 1 capsule (300 mg total) by mouth every 8 (eight) hours.   diltiazem 120 MG 24 hr capsule Commonly known as:  CARDIZEM CD Take 1 capsule (120 mg total) by mouth daily.   diphenhydrAMINE 25 MG tablet Commonly known as:  BENADRYL Take 25 mg by mouth every 6 (six) hours as needed.   escitalopram 10 MG tablet Commonly known as:  LEXAPRO Take 10 mg by mouth daily.   levothyroxine 50 MCG tablet Commonly known as:  SYNTHROID, LEVOTHROID Take 50 mcg by mouth daily before breakfast.   predniSONE 10 MG tablet Commonly known as:  DELTASONE Take 10-60 mg by mouth See admin instructions. Take 60 mg by mouth for 2 days, then 40 mg for 2 days, then 20 mg for 2 days, then 10 mg for 2 days.   PRILOSEC OTC 20 MG tablet Generic drug:  omeprazole Take 20 mg by mouth daily.   sodium chloride 0.65 % Soln nasal spray Commonly known as:  OCEAN Place 1 spray into both nostrils daily as needed for  congestion.         Diet and Activity recommendation: See Discharge Instructions above   Consults obtained -cardiology   Major procedures and Radiology Reports - PLEASE review detailed and final reports for all details, in brief -     Ct Super D Chest Wo Contrast  Result Date: 12/13/2018 CLINICAL DATA:  Preprocedure study for bronchoscopic guidance. EXAM: CT CHEST WITHOUT CONTRAST TECHNIQUE: Multidetector CT imaging of the chest was performed using thin slice collimation for electromagnetic bronchoscopy planning purposes, without intravenous contrast. COMPARISON:  11/07/2018 FINDINGS: Cardiovascular: The heart size is normal. No substantial pericardial effusion. Coronary artery calcification is evident. Atherosclerotic calcification is noted in the wall of the thoracic aorta. Mediastinum/Nodes: No mediastinal lymphadenopathy. No evidence for gross hilar lymphadenopathy although assessment is limited by the lack of intravenous contrast on  today's study. The esophagus has normal imaging features. There is no axillary lymphadenopathy. Similar appearance 13 mm right thyroid nodule. Lungs/Pleura: The central tracheobronchial airways are patent. Anterior right middle lobe peripheral wedge-shaped lesions seen on prior study has become less confluent in the interval (compare image 31/series 3 today to 73/3 previously. This right middle lobe lesion underlies fractures of the right third through sixth ribs. Stable minimal biapical pleuroparenchymal scarring. No pleural effusion. Chronic atelectasis or scarring in the anterior left lower lobe is stable. Calcification in the posterior left lower lobe (41/3) is stable. Areas of small airway impaction in the lower lobes (anterior right lower lobe 47/3) and posterior left lower lobe (40/3) have decreased in the interval. Upper Abdomen: Unremarkable. Musculoskeletal: No worrisome lytic or sclerotic osseous abnormality. IMPRESSION: 1. Right middle lobe peripheral lesion in question has become less confluent in the interval. 2. No new or progressive interval findings. 3. Areas of airway impaction seen previously have decreased in the interval. 4.  Aortic Atherosclerois (ICD10-170.0) Electronically Signed   By: Misty Stanley M.D.   On: 12/13/2018 13:26    Micro Results     Recent Results (from the past 240 hour(s))  Culture, bal-quantitative     Status: None   Collection Time: 12/13/18  4:01 PM  Result Value Ref Range Status   Specimen Description   Final    BRONCHIAL WASHINGS Performed at [facility], [address] [place], [place] [address]    Special Requests   Final    Normal Performed at [facility], [address] [place], [place] [address]    Gram Stain   Final    RARE WBC PRESENT,BOTH PMN AND MONONUCLEAR NO ORGANISMS SEEN    Culture   Final    NO GROWTH 3 DAYS Performed at [facility], [place] [address], [place], [place] [address]    Report Status  12/16/2018 FINAL  Final  Acid Fast Smear (AFB)     Status: None   Collection Time: 12/13/18  4:01 PM  Result Value Ref Range Status   AFB Specimen Processing Concentration  Final   Acid Fast Smear Negative  Final    Comment: (NOTE) Performed At: [facility] [place], [place] [id number] Rush Farmer MD [id number]    Source (AFB) BW  Final    Comment: Performed at [facility], [place], [place] [address]  Group A Strep by PCR [facility] Only)     Status: None   Collection Time: 12/18/18 10:23 PM  Result Value Ref Range Status   Group A Strep by PCR NOT DETECTED NOT DETECTED Final  Comment: Performed at [facility], [address] [place], [place] [address]       Today   Subjective:   Sheryl Michael today has no headache,no chest abdominal pain,no new weakness tingling or numbness, feels much better wants to go home today.   Objective:   Blood pressure (!) 135/99, pulse (!) 57, temperature 97.9 F (36.6 C), temperature source Oral, resp. rate 16, height _0  (1.549 m), weight 58.9 kg, SpO2 99 %.   Intake/Output Summary (Last 24 hours) at 12/20/2018 0955 Last data filed at 12/20/2018 0953 Gross per 24 hour  Intake 360 ml  Output 200 ml  Net 160 ml    Exam Awake Alert, Oriented x 3, No new F.N deficits, Normal affect [place].AT,PERRAL Supple Neck,No JVD, No cervical lymphadenopathy appriciated.  Symmetrical Chest wall movement, Good air movement bilaterally, CTAB RRR,No Gallops,Rubs or new Murmurs, No Parasternal Heave +ve B.Sounds, Abd Soft, Non tender, No organomegaly appriciated, No rebound -guarding or rigidity. No Cyanosis, Clubbing or edema, No new Rash or bruise  Data Review   CBC w Diff:  Lab Results  Component Value Date   WBC 19.1 (H) 12/19/2018   HGB 11.6 (L) 12/19/2018   HGB 11.9 (L) 04/15/2013   HCT 35.2 (L) 12/19/2018   HCT 35.7 04/15/2013   PLT 202 12/19/2018   PLT 163 04/15/2013   LYMPHOPCT  5 12/18/2018   LYMPHOPCT 42.5 04/15/2013   MONOPCT 0 12/18/2018   MONOPCT 7.8 04/15/2013   EOSPCT 1 12/18/2018   EOSPCT 6.3 04/15/2013   BASOPCT 0 12/18/2018   BASOPCT 1.3 04/15/2013    CMP:  Lab Results  Component Value Date   NA 137 12/19/2018   NA 145 04/15/2013   K 4.3 12/19/2018   K 4.0 04/15/2013   CL 107 12/19/2018   CL 114 (H) 04/15/2013   CO2 23 12/19/2018   CO2 25 04/15/2013   BUN 10 12/19/2018   BUN 13 04/15/2013   CREATININE 1.08 (H) 12/19/2018   CREATININE 0.91 04/15/2013   PROT 6.8 12/18/2018   PROT 7.3 04/14/2013   ALBUMIN 3.2 (L) 12/18/2018   ALBUMIN 3.4 04/14/2013   BILITOT 0.7 12/18/2018   BILITOT 0.4 04/14/2013   ALKPHOS 71 12/18/2018   ALKPHOS 93 04/14/2013   AST 20 12/18/2018   AST 17 04/14/2013   ALT 9 12/18/2018   ALT 16 04/14/2013  .   Total Time in preparing paper work, data evaluation and todays exam - 35 minutes  Epifanio Lesches M.D on 12/20/2018 at 9:55 AM    Note: This dictation was prepared with Dragon dictation along with smaller phrase technology. Any transcriptional errors that result from this process are unintentional.

## 2018-12-20 NOTE — Care Management Note (Signed)
Case Management Note  Patient Details  Name: MELVIN MARMO MRN: [id number] Date of Birth: 12-Aug-1931  Subjective/Objective:      Independent in all adls, denies issues accessing medical care, obtaining medications or with transportation.  Current with PCP.  No discharge needs identified at present by care manager or members of care team    Action/Plan:   Expected Discharge Date:  12/20/18               Expected Discharge Plan:  Home/Self Care  In-House Referral:     Discharge planning Services  CM Consult  Post Acute Care Choice:    Choice offered to:     DME Arranged:    DME Agency:     HH Arranged:    [place] Agency:     Status of Service:  Completed, signed off  If discussed at [employer] of Stay Meetings, dates discussed:    Additional Comments:  Elza Rafter, RN 12/20/2018, 11:39 AM

## 2018-12-20 NOTE — Progress Notes (Signed)
Pt discharged to home via wc.  Instructions  given to pt.  Questions answered.  No distress.  

## 2019-01-27 LAB — ACID FAST CULTURE WITH REFLEXED SENSITIVITIES (MYCOBACTERIA): Acid Fast Culture: NEGATIVE

## 2019-05-20 ENCOUNTER — Other Ambulatory Visit: Payer: Self-pay | Admitting: Internal Medicine

## 2019-05-20 DIAGNOSIS — Z1231 Encounter for screening mammogram for malignant neoplasm of breast: Secondary | ICD-10-CM

## 2019-05-22 ENCOUNTER — Ambulatory Visit
Admission: RE | Admit: 2019-05-22 | Discharge: 2019-05-22 | Disposition: A | Discharge status: Home / Self Care | Payer: Medicare Other | Source: Ambulatory Visit | Attending: Internal Medicine | Admitting: Internal Medicine

## 2019-05-22 ENCOUNTER — Other Ambulatory Visit: Payer: Self-pay

## 2019-05-22 DIAGNOSIS — Z1231 Encounter for screening mammogram for malignant neoplasm of breast: Secondary | ICD-10-CM | POA: Diagnosis not present

## 2020-01-23 ENCOUNTER — Emergency Department
Admission: EM | Admit: 2020-01-23 | Discharge: 2020-01-23 | Disposition: A | Discharge status: Home / Self Care | Payer: Medicare Other | Attending: Student | Admitting: Student

## 2020-01-23 ENCOUNTER — Encounter: Payer: Self-pay | Admitting: Emergency Medicine

## 2020-01-23 ENCOUNTER — Other Ambulatory Visit: Payer: Self-pay

## 2020-01-23 DIAGNOSIS — Z79899 Other long term (current) drug therapy: Secondary | ICD-10-CM | POA: Diagnosis not present

## 2020-01-23 DIAGNOSIS — Z85828 Personal history of other malignant neoplasm of skin: Secondary | ICD-10-CM | POA: Insufficient documentation

## 2020-01-23 DIAGNOSIS — Y92007 Garden or yard of unspecified non-institutional (private) residence as the place of occurrence of the external cause: Secondary | ICD-10-CM | POA: Insufficient documentation

## 2020-01-23 DIAGNOSIS — Z853 Personal history of malignant neoplasm of breast: Secondary | ICD-10-CM | POA: Diagnosis not present

## 2020-01-23 DIAGNOSIS — Z8673 Personal history of transient ischemic attack (TIA), and cerebral infarction without residual deficits: Secondary | ICD-10-CM | POA: Diagnosis not present

## 2020-01-23 DIAGNOSIS — Y93H2 Activity, gardening and landscaping: Secondary | ICD-10-CM | POA: Insufficient documentation

## 2020-01-23 DIAGNOSIS — Z7901 Long term (current) use of anticoagulants: Secondary | ICD-10-CM | POA: Diagnosis not present

## 2020-01-23 DIAGNOSIS — W268XXA Contact with other sharp object(s), not elsewhere classified, initial encounter: Secondary | ICD-10-CM | POA: Diagnosis not present

## 2020-01-23 DIAGNOSIS — S81812A Laceration without foreign body, left lower leg, initial encounter: Secondary | ICD-10-CM | POA: Diagnosis not present

## 2020-01-23 DIAGNOSIS — E039 Hypothyroidism, unspecified: Secondary | ICD-10-CM | POA: Insufficient documentation

## 2020-01-23 DIAGNOSIS — Y999 Unspecified external cause status: Secondary | ICD-10-CM | POA: Diagnosis not present

## 2020-01-23 MED ORDER — CEPHALEXIN 500 MG PO CAPS: 500.0000 mg | ORAL_CAPSULE | Freq: Two times a day (BID) | ORAL | 0 refills | Status: AC

## 2020-01-23 NOTE — ED Triage Notes (Signed)
Patient presents to the ED with a laceration to her left lower leg.  Laceration is in a half circle and is approx. 1.5 in long.  Patient states laceration occurred approx. 15 min prior to arrival and states she cut her leg on her brick steps.  Patient denies falling.  Patient states she is on eliquis.  Bleeding is controlled at this time.

## 2020-01-23 NOTE — ED Notes (Signed)
See triage note  Presents with laceration noted to left lower leg    States she did not fall and is not sure what she hit her leg on

## 2020-01-23 NOTE — ED Provider Notes (Signed)
[facility] Emergency Department Provider Note  ____________________________________________  Time seen: Approximately 5:11 PM  I have reviewed the triage vital signs and the nursing notes.   HISTORY  Chief Complaint Extremity Laceration    HPI Sheryl Michael is a 86 y.o. female who presents the emergency department complaining of a laceration to the left leg.  Patient states that while she was working on her yard she accidentally cut the left lateral lower leg on a brick.  She did not fall.  She sustained no other injuries.  Tetanus shot was updated 4 years ago.  Patient presents for evaluation of laceration to the left lower extremity.  Bleeding was controlled direct pressure.  Patient denies any other complaints.  Medical history as described below with no reported issues with chronic medical problems.         Past Medical History:  Diagnosis Date  . Arthritis   . Atrial fibrillation ([place])   . Breast cancer ([place]) 2006   right breast ca with lumpectomy and rad tx  . Colon polyp   . Cystocele   . Depression   . Female bladder prolapse   . GERD (gastroesophageal reflux disease)   . Goiter   . Hyperlipemia   . Hypothyroidism   . Osteoporosis   . Personal history of radiation therapy 2006   right breast ca  . Pneumonia   . Procidentia of uterus   . Recurrent UTI   . Reflux   . TIA (transient ischemic attack)   . Vaginal atrophy     Patient Active Problem List   Diagnosis Date Noted  . Strep throat 12/19/2018  . Allergic reaction 12/19/2018  . Sprain of interphalangeal joint of right ring finger 07/30/2018  . Traumatic complete tear of left rotator cuff 07/16/2018  . Strain of left hip 07/16/2018  . Encounter for Hemoccult screening 03/12/2018  . Hypothyroidism, acquired 07/10/2017  . Arrhythmia 05/31/2017  . Atrial fibrillation with RVR ([place]) 05/30/2017  . Dyspnea 05/30/2017  . Rotator cuff tendinitis, right 12/05/2016  . Shingles  12/29/2015  . S/P VH (vaginal hysterectomy) 12/22/2015  . Cystocele 12/21/2015  . Colon polyp 10/07/2015  . Bladder cystocele 10/07/2015  . Lower esophageal ring 10/07/2015  . H/O neoplasm 08/17/2015  . History of nonmelanoma skin cancer 08/17/2015  . Malignant neoplasm of breast ([place]) 04/01/2014  . GERD (gastroesophageal reflux disease) 04/01/2014  . Big thyroid 04/01/2014  . HLD (hyperlipidemia) 04/01/2014  . OP (osteoporosis) 04/01/2014  . Temporary cerebral vascular dysfunction 04/01/2014  . H/O gastrointestinal disease 02/28/2014    Past Surgical History:  Procedure Laterality Date  . APPENDECTOMY    . BREAST BIOPSY Right 2006   breast cancer  . BREAST LUMPECTOMY Right 2006   positive  . BREAST SURGERY Right    lumpectomy  . CATARACT EXTRACTION W/ INTRAOCULAR LENS  IMPLANT, BILATERAL    . COLONOSCOPY    . CYSTOCELE REPAIR N/A 12/21/2015   Procedure: ANTERIOR REPAIR (CYSTOCELE);  Surgeon: Brayton Mars, MD;  Location: [facility] ORS;  Service: Gynecology;  Laterality: N/A;  . DILATION AND CURETTAGE OF UTERUS    . ELECTROMAGNETIC NAVIGATION BROCHOSCOPY Right 12/13/2018   Procedure: ELECTROMAGNETIC NAVIGATION BRONCHOSCOPY;  Surgeon: Flora Lipps, MD;  Location: [facility] ORS;  Service: Cardiopulmonary;  Laterality: Right;  . EYE SURGERY    . VAGINAL HYSTERECTOMY Bilateral 12/21/2015   Procedure: TVH BSO;  Surgeon: Brayton Mars, MD;  Location: [facility] ORS;  Service: Gynecology;  Laterality: Bilateral;  Prior to Admission medications   Medication Sig Start Date End Date Taking? Authorizing Provider  acetaminophen (TYLENOL) 325 MG tablet Take 650 mg by mouth every 6 (six) hours as needed for moderate pain.    [provider]  apixaban (ELIQUIS) 2.5 MG TABS tablet Take 1 tablet (2.5 mg total) by mouth 2 (two) times daily. 05/31/17   Demetrios Loll, MD  Calcium Carbonate-Vitamin D (CALTRATE 600+D PO) Take 1 tablet by mouth daily.    [provider]  cephALEXin  (KEFLEX) 500 MG capsule Take 1 capsule (500 mg total) by mouth 2 (two) times daily for 7 days. 01/23/20 01/30/20  Jolynne Spurgin, Charline Bills, PA-C  diltiazem (CARDIZEM CD) 120 MG 24 hr capsule Take 1 capsule (120 mg total) by mouth daily. 06/01/17   Demetrios Loll, MD  diphenhydrAMINE (BENADRYL) 25 MG tablet Take 25 mg by mouth every 6 (six) hours as needed.    [provider]  escitalopram (LEXAPRO) 10 MG tablet Take 10 mg by mouth daily.     [provider]  levothyroxine (SYNTHROID, LEVOTHROID) 50 MCG tablet Take 50 mcg by mouth daily before breakfast.    [provider]  omeprazole (PRILOSEC OTC) 20 MG tablet Take 20 mg by mouth daily.     [provider]  sodium chloride (OCEAN) 0.65 % SOLN nasal spray Place 1 spray into both nostrils daily as needed for congestion.    [provider]    Allergies Bactrim [sulfamethoxazole-trimethoprim], Iodinated diagnostic agents, Codeine, Erythromycin ethylsuccinate, Phenobarbital, Ciprofloxacin, and Penicillin v potassium  Family History  Problem Relation Age of Onset  . Diabetes Sister   . Breast cancer Sister        late 33's  . Diabetes Brother   . Diabetes Sister     Social History Social History   Tobacco Use  . Smoking status: Never Smoker  . Smokeless tobacco: Never Used  Substance Use Topics  . Alcohol use: No  . Drug use: No     Review of Systems  Constitutional: No fever/chills Eyes: No visual changes. No discharge ENT: No upper respiratory complaints. Cardiovascular: no chest pain. Respiratory: no cough. No SOB. Gastrointestinal: No abdominal pain.  No nausea, no vomiting.  No diarrhea.  No constipation. Musculoskeletal: Negative for musculoskeletal pain. Skin: Left leg laceration Neurological: Negative for headaches, focal weakness or numbness. 10-point ROS otherwise negative.  ____________________________________________   PHYSICAL EXAM:  VITAL SIGNS: ED Triage Vitals  Enc  Vitals Group     BP 01/23/20 1614 136/63     Pulse Rate 01/23/20 1614 73     Resp 01/23/20 1614 16     Temp 01/23/20 1614 98.9 F (37.2 C)     Temp Source 01/23/20 1614 Oral     SpO2 01/23/20 1614 98 %     Weight 01/23/20 1615 130 lb (59 kg)     Height 01/23/20 1615 5\' 1"  (1.549 m)     Head Circumference --      Peak Flow --      Pain Score 01/23/20 1615 5     Pain Loc --      Pain Edu? --      Excl. in [place]? --      Constitutional: Alert and oriented. Well appearing and in no acute distress. Eyes: Conjunctivae are normal. PERRL. EOMI. Head: Atraumatic. ENT:      Ears:       Nose: No congestion/rhinnorhea.      Mouth/Throat: Mucous membranes are moist.  Neck: No stridor.    Cardiovascular: Normal rate, regular rhythm. Normal S1 and S2.  Good peripheral circulation. Respiratory: Normal respiratory effort without tachypnea or retractions. Lungs CTAB. Good air entry to the bases with no decreased or absent breath sounds. Musculoskeletal: Full range of motion to all extremities. No gross deformities appreciated. Neurologic:  Normal speech and language. No gross focal neurologic deficits are appreciated.  Skin:  Skin is warm, dry and intact. No rash noted.  Patient has a half-moon shaped laceration/skin tear to the left lower extremity.  Patient has a small flap of epidermal tissue.  No extension through the dermal tissue into the subcutaneous tissue at any point.  No visible foreign body.  No bleeding.  Laceration measures approximately 4 cm in length. Psychiatric: Mood and affect are normal. Speech and behavior are normal. Patient exhibits appropriate insight and judgement.   ____________________________________________   LABS (all labs ordered are listed, but only abnormal results are displayed)  Labs Reviewed - No data to display ____________________________________________  EKG   ____________________________________________  RADIOLOGY   No results  found.  ____________________________________________    PROCEDURES  Procedure(s) performed:    Marland KitchenMarland KitchenLaceration Repair  Date/Time: 01/23/2020 5:38 PM Performed by: Darletta Moll, PA-C Authorized by: Darletta Moll, PA-C   Consent:    Consent obtained:  Verbal   Consent given by:  Patient   Risks discussed:  Pain, poor wound healing, infection and retained foreign body Anesthesia (see MAR for exact dosages):    Anesthesia method:  None Laceration details:    Location:  Leg   Leg location:  L lower leg   Length (cm):  4 Repair type:    Repair type:  Simple Pre-procedure details:    Preparation:  Patient was prepped and draped in usual sterile fashion Exploration:    Hemostasis achieved with:  Direct pressure   Wound exploration: wound explored through full range of motion and entire depth of wound probed and visualized     Wound extent: no foreign bodies/material noted, no muscle damage noted, no nerve damage noted, no tendon damage noted, no underlying fracture noted and no vascular damage noted     Contaminated: yes   Treatment:    Area cleansed with:  Betadine and saline   Amount of cleaning:  Extensive   Irrigation solution:  Sterile saline   Irrigation volume:  1L   Irrigation method:  Syringe Skin repair:    Repair method:  Steri-Strips (Derma Clips)   Number of Steri-Strips:  3 Approximation:    Approximation:  Close Post-procedure details:    Dressing:  Tube gauze   Patient tolerance of procedure:  Tolerated well, no immediate complications Comments:     Laceration was well approximated with 3 dermal clips.  On review of laceration, it was felt that sutures would likely pull through very superficial skin flap.  I felt that approximation with dermaclips would provide adequate closure without the risk of further soft tissue injury.  After dermal clips were applied with good approximation of the edges.  Wound was wrapped using gauze, Ace bandage to  prevent any inadvertent dislodging of dermaclips      Medications - No data to display   ____________________________________________   INITIAL IMPRESSION / ASSESSMENT AND PLAN / ED COURSE  Pertinent labs & imaging results that were available during my care of the patient were reviewed by me and considered in my medical decision making (see chart for details).  Review of the [place] CSRS was  performed in accordance of the [place] prior to dispensing any controlled drugs.           Patient's diagnosis is consistent with leg laceration.  Patient presented to emergency department complaining of a laceration to the left lower leg.  Patient had a semicircular flap-like laceration that was almost consistent with skin tear.  Very fragile tissue remained to cause flap.  Given this I felt that sutures would likely pull through causing further soft tissue injury.  After discussion with the patient and her granddaughter it was felt that derma clips would be an appropriate method of closure.  There was good approximation of the edges.  Patient tolerated well.  Wound care instructions discussed with the patient and her granddaughter.  Patiently placed on antibiotics prophylactically.  Follow-up primary care as needed.  Tetanus shot was within the last 4 years and did not need to be updated at this time. Patient is given ED precautions to return to the ED for any worsening or new symptoms.     ____________________________________________  FINAL CLINICAL IMPRESSION(S) / ED DIAGNOSES  Final diagnoses:  Laceration of left lower extremity, initial encounter      NEW MEDICATIONS STARTED DURING THIS VISIT:  ED Discharge Orders         Ordered    cephALEXin (KEFLEX) 500 MG capsule  2 times daily     01/23/20 1737              This chart was dictated using voice recognition software/Dragon. Despite best efforts to proofread, errors can occur which can change the meaning. Any change was purely  unintentional.    Darletta Moll, PA-C 01/23/20 1741    Lilia Pro., MD 01/23/20 [phone]

## 2020-01-29 ENCOUNTER — Other Ambulatory Visit: Payer: Self-pay | Admitting: Internal Medicine

## 2020-01-29 DIAGNOSIS — R270 Ataxia, unspecified: Secondary | ICD-10-CM

## 2020-02-07 ENCOUNTER — Other Ambulatory Visit: Payer: Self-pay

## 2020-02-07 ENCOUNTER — Ambulatory Visit
Admission: RE | Admit: 2020-02-07 | Discharge: 2020-02-07 | Disposition: A | Discharge status: Home / Self Care | Payer: Medicare Other | Source: Ambulatory Visit | Attending: Internal Medicine | Admitting: Internal Medicine

## 2020-02-07 DIAGNOSIS — R270 Ataxia, unspecified: Secondary | ICD-10-CM | POA: Diagnosis not present

## 2020-03-04 ENCOUNTER — Inpatient Hospital Stay
Admission: EM | Admit: 2020-03-04 | Discharge: 2020-03-07 | DRG: 871 | Disposition: A | Discharge status: Home Health | Payer: Medicare Other | Attending: Internal Medicine | Admitting: Internal Medicine

## 2020-03-04 ENCOUNTER — Encounter: Payer: Self-pay | Admitting: Emergency Medicine

## 2020-03-04 ENCOUNTER — Emergency Department: Payer: Medicare Other

## 2020-03-04 ENCOUNTER — Other Ambulatory Visit: Payer: Self-pay

## 2020-03-04 DIAGNOSIS — I482 Chronic atrial fibrillation, unspecified: Secondary | ICD-10-CM | POA: Diagnosis present

## 2020-03-04 DIAGNOSIS — Z7901 Long term (current) use of anticoagulants: Secondary | ICD-10-CM

## 2020-03-04 DIAGNOSIS — Z853 Personal history of malignant neoplasm of breast: Secondary | ICD-10-CM

## 2020-03-04 DIAGNOSIS — F0390 Unspecified dementia without behavioral disturbance: Secondary | ICD-10-CM | POA: Diagnosis present

## 2020-03-04 DIAGNOSIS — R6521 Severe sepsis with septic shock: Secondary | ICD-10-CM | POA: Diagnosis present

## 2020-03-04 DIAGNOSIS — R410 Disorientation, unspecified: Secondary | ICD-10-CM | POA: Diagnosis not present

## 2020-03-04 DIAGNOSIS — E039 Hypothyroidism, unspecified: Secondary | ICD-10-CM | POA: Diagnosis present

## 2020-03-04 DIAGNOSIS — G9341 Metabolic encephalopathy: Secondary | ICD-10-CM | POA: Diagnosis present

## 2020-03-04 DIAGNOSIS — A419 Sepsis, unspecified organism: Secondary | ICD-10-CM | POA: Diagnosis not present

## 2020-03-04 DIAGNOSIS — I4891 Unspecified atrial fibrillation: Secondary | ICD-10-CM | POA: Diagnosis present

## 2020-03-04 DIAGNOSIS — Z90710 Acquired absence of both cervix and uterus: Secondary | ICD-10-CM

## 2020-03-04 DIAGNOSIS — I10 Essential (primary) hypertension: Secondary | ICD-10-CM | POA: Diagnosis present

## 2020-03-04 DIAGNOSIS — I959 Hypotension, unspecified: Secondary | ICD-10-CM

## 2020-03-04 DIAGNOSIS — M810 Age-related osteoporosis without current pathological fracture: Secondary | ICD-10-CM | POA: Diagnosis present

## 2020-03-04 DIAGNOSIS — Z20822 Contact with and (suspected) exposure to covid-19: Secondary | ICD-10-CM | POA: Diagnosis present

## 2020-03-04 DIAGNOSIS — K219 Gastro-esophageal reflux disease without esophagitis: Secondary | ICD-10-CM | POA: Diagnosis present

## 2020-03-04 DIAGNOSIS — Z923 Personal history of irradiation: Secondary | ICD-10-CM

## 2020-03-04 DIAGNOSIS — E785 Hyperlipidemia, unspecified: Secondary | ICD-10-CM | POA: Diagnosis present

## 2020-03-04 DIAGNOSIS — N390 Urinary tract infection, site not specified: Secondary | ICD-10-CM | POA: Diagnosis present

## 2020-03-04 DIAGNOSIS — Z8673 Personal history of transient ischemic attack (TIA), and cerebral infarction without residual deficits: Secondary | ICD-10-CM

## 2020-03-04 DIAGNOSIS — Z79890 Hormone replacement therapy (postmenopausal): Secondary | ICD-10-CM

## 2020-03-04 DIAGNOSIS — Z79899 Other long term (current) drug therapy: Secondary | ICD-10-CM

## 2020-03-04 DIAGNOSIS — Z87440 Personal history of urinary (tract) infections: Secondary | ICD-10-CM | POA: Diagnosis present

## 2020-03-04 DIAGNOSIS — Z9049 Acquired absence of other specified parts of digestive tract: Secondary | ICD-10-CM

## 2020-03-04 LAB — LACTIC ACID, PLASMA: Lactic Acid, Venous: 1.3 mmol/L (ref 0.5–1.9)

## 2020-03-04 LAB — COMPREHENSIVE METABOLIC PANEL
ALT: 12 U/L (ref 0–44)
AST: 17 U/L (ref 15–41)
Albumin: 3.5 g/dL (ref 3.5–5.0)
Alkaline Phosphatase: 68 U/L (ref 38–126)
Anion gap: 9 (ref 5–15)
BUN: 16 mg/dL (ref 8–23)
CO2: 23 mmol/L (ref 22–32)
Calcium: 8.9 mg/dL (ref 8.9–10.3)
Chloride: 103 mmol/L (ref 98–111)
Creatinine, Ser: 1.09 mg/dL — ABNORMAL HIGH (ref 0.44–1.00)
GFR calc Af Amer: 53 mL/min — ABNORMAL LOW (ref >60)
GFR calc non Af Amer: 46 mL/min — ABNORMAL LOW (ref >60)
Glucose, Bld: 120 mg/dL — ABNORMAL HIGH (ref 70–99)
Potassium: 4.1 mmol/L (ref 3.5–5.1)
Sodium: 135 mmol/L (ref 135–145)
Total Bilirubin: 0.8 mg/dL (ref 0.3–1.2)
Total Protein: 7.1 g/dL (ref 6.5–8.1)

## 2020-03-04 LAB — CBC WITH DIFFERENTIAL/PLATELET
Abs Immature Granulocytes: 0.11 10*3/uL — ABNORMAL HIGH (ref 0.00–0.07)
Basophils Absolute: 0.1 10*3/uL (ref 0.0–0.1)
Basophils Relative: 0 %
Eosinophils Absolute: 0.2 10*3/uL (ref 0.0–0.5)
Eosinophils Relative: 1 %
HCT: 39 % (ref 36.0–46.0)
Hemoglobin: 13 g/dL (ref 12.0–15.0)
Immature Granulocytes: 1 %
Lymphocytes Relative: 5 %
Lymphs Abs: 0.8 10*3/uL (ref 0.7–4.0)
MCH: 31.4 pg (ref 26.0–34.0)
MCHC: 33.3 g/dL (ref 30.0–36.0)
MCV: 94.2 fL (ref 80.0–100.0)
Monocytes Absolute: 0.6 10*3/uL (ref 0.1–1.0)
Monocytes Relative: 3 %
Neutro Abs: 16.4 10*3/uL — ABNORMAL HIGH (ref 1.7–7.7)
Neutrophils Relative %: 90 %
Platelets: 226 10*3/uL (ref 150–400)
RBC: 4.14 MIL/uL (ref 3.87–5.11)
RDW: 13.5 % (ref 11.5–15.5)
WBC: 18.3 10*3/uL — ABNORMAL HIGH (ref 4.0–10.5)
nRBC: 0 % (ref 0.0–0.2)

## 2020-03-04 LAB — URINALYSIS, COMPLETE (UACMP) WITH MICROSCOPIC
Bacteria, UA: NONE SEEN
Bilirubin Urine: NEGATIVE
Glucose, UA: NEGATIVE mg/dL
Hgb urine dipstick: NEGATIVE
Ketones, ur: NEGATIVE mg/dL
Leukocytes,Ua: NEGATIVE
Nitrite: NEGATIVE
Protein, ur: NEGATIVE mg/dL
Specific Gravity, Urine: 1.006 (ref 1.005–1.030)
Squamous Epithelial / HPF: NONE SEEN (ref 0–5)
pH: 7 (ref 5.0–8.0)

## 2020-03-04 LAB — PROTIME-INR
INR: 1.1 (ref 0.8–1.2)
Prothrombin Time: 14 seconds (ref 11.4–15.2)

## 2020-03-04 LAB — PROCALCITONIN: Procalcitonin: 0.9 ng/mL

## 2020-03-04 LAB — APTT: aPTT: 38 seconds — ABNORMAL HIGH (ref 24–36)

## 2020-03-04 MED ORDER — LACTATED RINGERS IV BOLUS (SEPSIS)
1000.0000 mL | Freq: Once | INTRAVENOUS | Status: AC
  Administered 2020-03-04: 1000 mL via INTRAVENOUS

## 2020-03-04 MED ORDER — SODIUM CHLORIDE 0.9% FLUSH
3.0000 mL | Freq: Once | INTRAVENOUS | Status: AC
  Administered 2020-03-05: 3 mL via INTRAVENOUS

## 2020-03-04 MED ORDER — SODIUM CHLORIDE 0.9 % IV SOLN
1.0000 g | INTRAVENOUS | Status: DC
  Administered 2020-03-04 – 2020-03-06 (×3): 1 g via INTRAVENOUS
  Filled 2020-03-04 (×2): qty 10
  Filled 2020-03-04 (×2): qty 1
  Filled 2020-03-04: qty 10

## 2020-03-04 MED ORDER — ACETAMINOPHEN 325 MG PO TABS
650.0000 mg | ORAL_TABLET | Freq: Once | ORAL | Status: AC
  Administered 2020-03-04: 650 mg via ORAL
  Filled 2020-03-04: qty 2

## 2020-03-04 NOTE — ED Triage Notes (Signed)
Pt to triage via w/c with no distress noted, mask in place; pt began taking macrobid for UTI yesterday; c/o persistent nonprod cough, fever & chills

## 2020-03-04 NOTE — ED Provider Notes (Signed)
[facility] Emergency Department Provider Note  ____________________________________________   First MD Initiated Contact with Patient 03/04/20 2301     (approximate)  I have reviewed the triage vital signs and the nursing notes.   HISTORY  Chief Complaint No chief complaint on file.  Level 5 caveat:  history/ROS limited by acute/critical illness  HPI Sheryl Michael is a 86 y.o. female with medical history as listed below which is notable for atrial fibrillation on Eliquis.  Her granddaughter is at bedside and said that she is normally "sharp as attack" with no sign of dementia at baseline.  The patient presents tonight for evaluation of likely a couple of days of gradually worsening symptoms about which she was mostly quiet and did not tell family until 2 days ago when she went to her primary care doctor for a B12 shot.  When they were there she told the nurse that she had not been feeling well in general and asked them to check a urinalysis which was positive for UTI.  She was started on Macrobid and the granddaughter reports that she has had a total of 4 doses.  However she has been gradually worsening in terms of her overall mental status and has had temperatures as high as 103 measured at home with a thermometer.  She is intermittently confused and has been having a high heart rate which happens sometimes when she gets sick.  She has a history of chronic atrial fibrillation but it is generally well controlled except for when she becomes acutely ill and then the heart rate gets high and the granddaughter noticed that her heart rate has been in the 140s to 150s tonight so she brought her in for further evaluation.  Her granddaughter provides a lot of assistance and has been making sure she is taking her medications.  The patient is confused and not able to provide much history.  She agrees that she has been having fevers and chills intermittently.  She said that she  does not feel well all over but she denies shortness of breath and any pain.  However the granddaughter reports that she has had some nausea and at least one episode of vomiting over the last 24 hours.  She also reports that the patient has intermittently grabbed her chest and said that she is having chest pain but these episodes have been brief.  Overall the patient's symptoms have been severe and nothing in particular is making them better or worse including the antibiotic she has been taking as an outpatient.        Past Medical History:  Diagnosis Date  . Arthritis   . Atrial fibrillation ([place])   . Breast cancer ([place]) 2006   right breast ca with lumpectomy and rad tx  . Colon polyp   . Cystocele   . Depression   . Female bladder prolapse   . GERD (gastroesophageal reflux disease)   . Goiter   . Hyperlipemia   . Hypothyroidism   . Osteoporosis   . Personal history of radiation therapy 2006   right breast ca  . Pneumonia   . Procidentia of uterus   . Recurrent UTI   . Reflux   . TIA (transient ischemic attack)   . Vaginal atrophy     Patient Active Problem List   Diagnosis Date Noted  . Strep throat 12/19/2018  . Allergic reaction 12/19/2018  . Sprain of interphalangeal joint of right ring finger 07/30/2018  . Traumatic  complete tear of left rotator cuff 07/16/2018  . Strain of left hip 07/16/2018  . Encounter for Hemoccult screening 03/12/2018  . Hypothyroidism, acquired 07/10/2017  . Arrhythmia 05/31/2017  . Atrial fibrillation with RVR ([place]) 05/30/2017  . Dyspnea 05/30/2017  . Rotator cuff tendinitis, right 12/05/2016  . Shingles 12/29/2015  . S/P VH (vaginal hysterectomy) 12/22/2015  . Cystocele 12/21/2015  . Colon polyp 10/07/2015  . Bladder cystocele 10/07/2015  . Lower esophageal ring 10/07/2015  . H/O neoplasm 08/17/2015  . History of nonmelanoma skin cancer 08/17/2015  . Malignant neoplasm of breast ([place]) 04/01/2014  . GERD (gastroesophageal reflux  disease) 04/01/2014  . Big thyroid 04/01/2014  . HLD (hyperlipidemia) 04/01/2014  . OP (osteoporosis) 04/01/2014  . Temporary cerebral vascular dysfunction 04/01/2014  . H/O gastrointestinal disease 02/28/2014    Past Surgical History:  Procedure Laterality Date  . APPENDECTOMY    . BREAST BIOPSY Right 2006   breast cancer  . BREAST LUMPECTOMY Right 2006   positive  . BREAST SURGERY Right    lumpectomy  . CATARACT EXTRACTION W/ INTRAOCULAR LENS  IMPLANT, BILATERAL    . COLONOSCOPY    . CYSTOCELE REPAIR N/A 12/21/2015   Procedure: ANTERIOR REPAIR (CYSTOCELE);  Surgeon: Brayton Mars, MD;  Location: [facility] ORS;  Service: Gynecology;  Laterality: N/A;  . DILATION AND CURETTAGE OF UTERUS    . ELECTROMAGNETIC NAVIGATION BROCHOSCOPY Right 12/13/2018   Procedure: ELECTROMAGNETIC NAVIGATION BRONCHOSCOPY;  Surgeon: Flora Lipps, MD;  Location: [facility] ORS;  Service: Cardiopulmonary;  Laterality: Right;  . EYE SURGERY    . VAGINAL HYSTERECTOMY Bilateral 12/21/2015   Procedure: TVH BSO;  Surgeon: Brayton Mars, MD;  Location: [facility] ORS;  Service: Gynecology;  Laterality: Bilateral;    Prior to Admission medications   Medication Sig Start Date End Date Taking? Authorizing Provider  acetaminophen (TYLENOL) 325 MG tablet Take 650 mg by mouth every 6 (six) hours as needed for moderate pain.    [provider]  apixaban (ELIQUIS) 2.5 MG TABS tablet Take 1 tablet (2.5 mg total) by mouth 2 (two) times daily. 05/31/17   Demetrios Loll, MD  Calcium Carbonate-Vitamin D (CALTRATE 600+D PO) Take 1 tablet by mouth daily.    [provider]  diltiazem (CARDIZEM CD) 120 MG 24 hr capsule Take 1 capsule (120 mg total) by mouth daily. 06/01/17   Demetrios Loll, MD  diphenhydrAMINE (BENADRYL) 25 MG tablet Take 25 mg by mouth every 6 (six) hours as needed.    [provider]  escitalopram (LEXAPRO) 10 MG tablet Take 10 mg by mouth daily.     [provider]  levothyroxine  (SYNTHROID, LEVOTHROID) 50 MCG tablet Take 50 mcg by mouth daily before breakfast.    [provider]  omeprazole (PRILOSEC OTC) 20 MG tablet Take 20 mg by mouth daily.     [provider]  sodium chloride (OCEAN) 0.65 % SOLN nasal spray Place 1 spray into both nostrils daily as needed for congestion.    [provider]    Allergies Bactrim [sulfamethoxazole-trimethoprim], Iodinated diagnostic agents, Codeine, Erythromycin ethylsuccinate, Phenobarbital, Ciprofloxacin, and Penicillin v potassium  Family History  Problem Relation Age of Onset  . Diabetes Sister   . Breast cancer Sister        late 58's  . Diabetes Brother   . Diabetes Sister     Social History Social History   Tobacco Use  . Smoking status: Never Smoker  . Smokeless tobacco: Never Used  Substance Use Topics  .  Alcohol use: No  . Drug use: No    Review of Systems Level 5 caveat:  history/ROS limited by acute/critical illness   constitutional: +fever/chills, T-max at home of 103 Eyes: No visual changes. ENT: No sore throat. Cardiovascular: Intermittent chest pain Respiratory: Denies shortness of breath.  Some intermittent cough. Gastrointestinal: No abdominal pain.  Episodic nausea and vomiting.  No diarrhea.  No constipation. Genitourinary: Negative for dysuria.  Positive for urinary tract infection as an outpatient over the last couple of days. Musculoskeletal: Negative for neck pain.  Negative for back pain. Integumentary: Negative for rash. Neurological: Negative for headaches, focal weakness or numbness.   ____________________________________________   PHYSICAL EXAM:  VITAL SIGNS: ED Triage Vitals  Enc Vitals Group     BP 03/04/20 2226 (!) 140/57     Pulse Rate 03/04/20 2226 (!) 155     Resp 03/04/20 2226 20     Temp 03/04/20 2226 98.6 F (37 C)     Temp Source 03/04/20 2226 Oral     SpO2 03/04/20 2226 95 %     Weight 03/04/20 2224 58.5 kg (129 lb)     Height  03/04/20 2224 1.524 m (5')     Head Circumference --      Peak Flow --      Pain Score 03/04/20 2224 0     Pain Loc --      Pain Edu? --      Excl. in [place]? --     Constitutional: Awake and alert.  Confused, not certain about who her granddaughter is and this is abnormal for her.  Appears uncomfortable but nontoxic. Eyes: Conjunctivae are normal.  Head: Atraumatic. Nose: No congestion/rhinnorhea. Mouth/Throat: Patient is wearing a mask. Neck: No stridor.  No meningeal signs.   Cardiovascular: Tachycardia, irregular rhythm. Good peripheral circulation. Grossly normal heart sounds. Respiratory: Normal respiratory effort.  No retractions. Gastrointestinal: Soft and nontender. No distention.  Musculoskeletal: No lower extremity tenderness nor edema. No gross deformities of extremities. Neurologic:  Normal speech and language. No gross focal neurologic deficits are appreciated.  Skin:  Skin is warm, dry and intact. Psychiatric: Mood and affect are normal. Speech and behavior are normal.  ____________________________________________   LABS (all labs ordered are listed, but only abnormal results are displayed)  Labs Reviewed  COMPREHENSIVE METABOLIC PANEL - Abnormal; Notable for the following components:      Result Value   Glucose, Bld 120 (*)    Creatinine, Ser 1.09 (*)    GFR calc non Af Amer 46 (*)    GFR calc Af Amer 53 (*)    All other components within normal limits  CBC WITH DIFFERENTIAL/PLATELET - Abnormal; Notable for the following components:   WBC 18.3 (*)    Neutro Abs 16.4 (*)    Abs Immature Granulocytes 0.11 (*)    All other components within normal limits  URINALYSIS, COMPLETE (UACMP) WITH MICROSCOPIC - Abnormal; Notable for the following components:   Color, Urine YELLOW (*)    APPearance CLEAR (*)    All other components within normal limits  APTT - Abnormal; Notable for the following components:   aPTT 38 (*)    All other components within normal limits    SARS CORONAVIRUS 2 BY RT PCR (HOSPITAL ORDER, [facility])  CULTURE, BLOOD (ROUTINE X 2)  CULTURE, BLOOD (ROUTINE X 2)  URINE CULTURE  LACTIC ACID, PLASMA  PROCALCITONIN  PROTIME-INR  CBC  BASIC METABOLIC PANEL  TSH  POC SARS CORONAVIRUS 2 AG -  ED  TROPONIN I (HIGH SENSITIVITY)   ____________________________________________  EKG  ED ECG REPORT I, Hinda Kehr, the attending physician, personally viewed and interpreted this ECG.  Date: 03/04/2020 EKG Time: 22: 29 Rate: 151 Rhythm: A. fib with RVR QRS Axis: normal Intervals: Atrial fibrillation, otherwise unremarkable ST/T Wave abnormalities: Non-specific ST segment / T-wave changes, likely rate related ischemia. Narrative Interpretation: Possible rate related ischemia, no evidence of STEMI.   ____________________________________________  RADIOLOGY I, Hinda Kehr, personally viewed and evaluated these images (plain radiographs) as part of my medical decision making, as well as reviewing the written report by the radiologist.  ED MD interpretation:  No acute abnormalities on CXR nor CT renal stone protocol  Official radiology report(s): DG Chest Portable 1 View  Result Date: 03/04/2020 CLINICAL DATA:  Urinary tract infection, fever, intermittent cough EXAM: PORTABLE CHEST 1 VIEW COMPARISON:  08/25/2017, 12/13/2018 FINDINGS: Single frontal view of the chest demonstrates an unremarkable cardiac silhouette. No acute airspace disease, effusion, or pneumothorax. Scattered areas of scarring are stable, most pronounced within the lateral segment right middle lobe as seen on previous CT. Prior healed bilateral rib fractures. Soft tissue calcifications right breast. IMPRESSION: 1. Chronic scarring, no acute airspace disease. Electronically Signed   By: Randa Ngo M.D.   On: 03/04/2020 23:45   CT Renal Stone Study  Result Date: 03/05/2020 CLINICAL DATA:  Fever and chills EXAM: CT ABDOMEN AND PELVIS  WITHOUT CONTRAST TECHNIQUE: Multidetector CT imaging of the abdomen and pelvis was performed following the standard protocol without IV contrast. COMPARISON:  September 14, 2004 FINDINGS: Lower chest: There is a calcified granuloma at the left lung base, otherwise the lungs are clear.The heart size is normal. Hepatobiliary: The liver is normal. Normal gallbladder.There is no biliary ductal dilation. Pancreas: Normal contours without ductal dilatation. No peripancreatic fluid collection. Spleen: Unremarkable. Adrenals/Urinary Tract: --Adrenal glands: Unremarkable. --Right kidney/ureter: No hydronephrosis or radiopaque kidney stones. --Left kidney/ureter: No hydronephrosis or radiopaque kidney stones. --Urinary bladder: There are few tiny pockets gas in the urinary bladder. Stomach/Bowel: --Stomach/Duodenum: No hiatal hernia or other gastric abnormality. Normal duodenal course and caliber. --Small bowel: Unremarkable. --Colon: Rectosigmoid diverticulosis without acute inflammation. --Appendix: Normal. Vascular/Lymphatic: Atherosclerotic calcification is present within the non-aneurysmal abdominal aorta, without hemodynamically significant stenosis. --No retroperitoneal lymphadenopathy. --No mesenteric lymphadenopathy. --No pelvic or inguinal lymphadenopathy. Reproductive: Status post hysterectomy. No adnexal mass. There appears to be some degree of pelvic floor laxity. Other: No ascites or free air. The abdominal wall is normal. Musculoskeletal. No acute displaced fractures. IMPRESSION: 1. No acute abdominopelvic abnormality. 2. There are few tiny pockets gas in the urinary bladder. Correlation with urinalysis is recommended. This may be related to prior instrumentation. 3. Rectosigmoid diverticulosis without acute inflammation. Aortic Atherosclerosis (ICD10-I70.0). Electronically Signed   By: Constance Holster M.D.   On: 03/05/2020 00:27     ____________________________________________   PROCEDURES   Procedure(s) performed (including Critical Care):  .1-3 Lead EKG Interpretation Performed by: Hinda Kehr, MD Authorized by: Hinda Kehr, MD     Interpretation: abnormal     ECG rate:  144   ECG rate assessment: tachycardic     Rhythm: atrial fibrillation     Ectopy: none     Conduction: normal   .Critical Care Performed by: Hinda Kehr, MD Authorized by: Hinda Kehr, MD   Critical care provider statement:    Critical care time (minutes):  45   Critical care time was exclusive of:  Separately billable procedures  and treating other patients   Critical care was necessary to treat or prevent imminent or life-threatening deterioration of the following conditions:  Sepsis (a-fib w/ RVR)   Critical care was time spent personally by me on the following activities:  Development of treatment plan with patient or surrogate, discussions with consultants, evaluation of patient's response to treatment, examination of patient, obtaining history from patient or surrogate, ordering and performing treatments and interventions, ordering and review of laboratory studies, ordering and review of radiographic studies, pulse oximetry, re-evaluation of patient's condition and review of old charts     ____________________________________________   [place] / MDM / [place] / ED COURSE  As part of my medical decision making, I reviewed the following data within the electronic MEDICAL RECORD NUMBER History obtained from family, Nursing notes reviewed and incorporated, Labs reviewed , EKG interpreted , Old chart reviewed, Discussed with admitting physician, discussed with ICU NP,   and reviewed Notes from prior ED visits   Differential diagnosis includes, but is not limited to, sepsis, UTI, pyelonephritis, obstructive uropathy with infected stone, pneumonia, COVID-19, other nonspecific viral infection, bacteremia,  acute intra-abdominal infection such as gallbladder disease or appendicitis.  By far the most likely diagnosis is UTI.  I reviewed the medical record and verified with the granddaughter is telling me at the bedside that she had a positive urinalysis.  She has received several doses of Macrobid so her UA may not be impressive tonight, but she has a history of UTIs with a similar presentation and was nitrite positive on the recent outpatient UA.  However at this moment when I am initially seeing her she technically does not meet criteria for sepsis given that her oral temperature was about 99 degrees.  She has a known history of A. fib so technically she is tachycardic currently but that is the only abnormality.  However the nurse will obtain a rectal temperature as well as obtaining most of the labs for a sepsis work-up and I will reassess as needed.  Her altered mental status is likely secondary to delirium in the setting of acute infection and/or the A. fib with RVR.  I explained to the granddaughter that I am going to treat the rate currently with lactated Ringer's 1 L IV and we will monitor for rate changes but I am not initially going to treat the heart rate until we treat the infection and/or volume depletion.  The patient is on the cardiac monitor to evaluate for evidence of arrhythmia and/or significant heart rate changes.     Clinical Course as of Mar 05 417  Wed Mar 04, 2020  2324 WBC(!): 18.3 [CF]  2325 RN informed me that rectal temp is 102.1, and patient's CBC demonstrates WBC of 18.3.  She now meets septic criteria given known UTI.  Ordering sepsis protocol including ceftriaxone 1 g IV.   [CF]  2338 Normal appearing UA, but she has had four doses of Macrobid, which may have sterilized the appearance of the urine.  CareEverywhere confirms recent grossly (nitrite) positive UA/UTI.  Urinalysis, Complete w Microscopic(!) [CF]  2344 Point-of-care COVID-19 antigen test is negative.  I have  ordered the PCR test.  POC SARS Coronavirus 2 Ag-ED - Nasal Swab (BD Veritor Kit) [CF]  2350 Comprehensive metabolic panel is within normal limits   [CF]  2350 Lactic Acid, Venous: 1.3 [CF]  2350 The patient meets criteria for sepsis but not severe sepsis nor septic shock.  Blood pressure is stable with  a systolic in the 741O to 878M.   [CF]  2351 Given the UA change from her outpatient test and the report of intermittent pain and the nausea and vomiting from the granddaughter and the patient's inability to provide a solid history, I am going to obtain a CT renal stone protocol to rule out obstructive uropathy and the possibility of an infected stone which would have a very high morbidity and mortality.   [CF]  7672 No acute abnormality identified on chest x-ray.  DG Chest Portable 1 View [CF]  2354 Elevated, consistent with sepsis, but not severe sepsis  Procalcitonin: 0.90 [CF]  Thu Mar 05, 2020  0052 Patient is hemodynamically stable.  Sepsis reassessment completed.  Her blood pressure dropped after the ceftriaxone and she has completed 1 L of lactated Ringer's.  She is still in A. fib with RVR with a rate ranging from 1 20-1 45.  At this point I think it is appropriate to give her a dose of diltiazem 15 mg IV to see how she responds from a rate perspective.  Her blood pressure is softer now than it was when she initially came in, about 094 systolic, but I feel we need to address the heart rate/RVR issue.  I updated the patient and her granddaughter who is at bedside and I am putting in a consult to hospitalist for admission.   [CF]  0112 Based on the patient's current status and the status over the last 3 hours, I believe she is appropriate for stepdown care rather than ICU.  However, if she needs a diltiazem infusion, she may need ICU care.   [CF]  0137 I discussed the case with Dr. Sidney Ace to admit the patient.  However, presumably as a result of the diltiazem, the patient is now substantially  hypertensive with blood pressure around 70 systolic.  I suspect this is transient as a result of her acute illness but also as result of the diltiazem to try to improve her heart rate (which notably is now in the 90s), and of note she only received about 5 mg of the diltiazem before her blood pressure dropped.  But at this point we are going to observe her for a little while longer and see if she would be more appropriate for the ICU.   [CF]  7096 Patient's blood pressure remains low with a MAP of 59.  She is not appropriate for a floor bed at this time.  Given the possibility she may require at least peripheral pressors if her pressure does not improve soon, I discussed the case by phone with Darlyn Chamber with the ICU.  He agreed to put in some admission orders and see the patient.   [CF]  0214 I updated Dr. Sidney Ace with the admission plan   [CF]  [phone] Of note, I do not believe that the patient's hypotension is due to septic shock.  Her procalcitonin is only 0.9 and her lactic acid is normal.  She was not hypotensive until she received diltiazem which was unfortunately necessary to try and control the rate of her atrial fibrillation with RVR.However, since she remains hypotensive, I am giving another 500 mL of lactated Ringer's.  This has the added benefit of reaching the 30 mL/kg IV fluid bolus goal for septic presentation.   [CF]  0302 SARS Coronavirus 2: NEGATIVE [CF]    Clinical Course User Index [CF] Hinda Kehr, MD     ____________________________________________  FINAL CLINICAL IMPRESSION(S) / ED DIAGNOSES  Final  diagnoses:  None     MEDICATIONS GIVEN DURING THIS VISIT:  Medications  sodium chloride flush (NS) 0.9 % injection 3 mL (has no administration in time range)  lactated ringers bolus 1,000 mL (1,000 mLs Intravenous New Bag/Given 03/04/20 2333)  cefTRIAXone (ROCEPHIN) 1 g in sodium chloride 0.9 % 100 mL IVPB (1 g Intravenous New Bag/Given 03/04/20 2344)  acetaminophen (TYLENOL)  tablet 650 mg (650 mg Oral Given 03/04/20 2330)     ED Discharge Orders    None      *Please note:  Sheryl Michael was evaluated in Emergency Department on 03/04/2020 for the symptoms described in the history of present illness. She was evaluated in the context of the global COVID-19 pandemic, which necessitated consideration that the patient might be at risk for infection with the SARS-CoV-2 virus that causes COVID-19. Institutional protocols and algorithms that pertain to the evaluation of patients at risk for COVID-19 are in a state of rapid change based on information released by regulatory bodies including the CDC and federal and state organizations. These policies and algorithms were followed during the patient's care in the ED.  Some ED evaluations and interventions may be delayed as a result of limited staffing during the pandemic.*  Note:  This document was prepared using Dragon voice recognition software and may include unintentional dictation errors.   Hinda Kehr, MD 03/05/20 [phone]

## 2020-03-04 NOTE — ED Notes (Signed)
LR paused and IV site flushed with 10cc NS prior to rocephin administration

## 2020-03-05 ENCOUNTER — Inpatient Hospital Stay
Admit: 2020-03-05 | Discharge: 2020-03-05 | Disposition: A | Discharge status: Home / Self Care | Payer: Medicare Other | Attending: Pulmonary Disease | Admitting: Pulmonary Disease

## 2020-03-05 DIAGNOSIS — I4891 Unspecified atrial fibrillation: Secondary | ICD-10-CM | POA: Diagnosis not present

## 2020-03-05 DIAGNOSIS — N390 Urinary tract infection, site not specified: Secondary | ICD-10-CM | POA: Diagnosis present

## 2020-03-05 DIAGNOSIS — R6520 Severe sepsis without septic shock: Secondary | ICD-10-CM | POA: Diagnosis not present

## 2020-03-05 DIAGNOSIS — K219 Gastro-esophageal reflux disease without esophagitis: Secondary | ICD-10-CM | POA: Diagnosis present

## 2020-03-05 DIAGNOSIS — I10 Essential (primary) hypertension: Secondary | ICD-10-CM | POA: Diagnosis present

## 2020-03-05 DIAGNOSIS — M810 Age-related osteoporosis without current pathological fracture: Secondary | ICD-10-CM | POA: Diagnosis present

## 2020-03-05 DIAGNOSIS — I482 Chronic atrial fibrillation, unspecified: Secondary | ICD-10-CM | POA: Diagnosis present

## 2020-03-05 DIAGNOSIS — Z79890 Hormone replacement therapy (postmenopausal): Secondary | ICD-10-CM | POA: Diagnosis not present

## 2020-03-05 DIAGNOSIS — Z8673 Personal history of transient ischemic attack (TIA), and cerebral infarction without residual deficits: Secondary | ICD-10-CM | POA: Diagnosis not present

## 2020-03-05 DIAGNOSIS — A419 Sepsis, unspecified organism: Secondary | ICD-10-CM | POA: Diagnosis present

## 2020-03-05 DIAGNOSIS — G9341 Metabolic encephalopathy: Secondary | ICD-10-CM | POA: Diagnosis present

## 2020-03-05 DIAGNOSIS — E039 Hypothyroidism, unspecified: Secondary | ICD-10-CM | POA: Diagnosis present

## 2020-03-05 DIAGNOSIS — N10 Acute tubulo-interstitial nephritis: Secondary | ICD-10-CM | POA: Diagnosis not present

## 2020-03-05 DIAGNOSIS — F0390 Unspecified dementia without behavioral disturbance: Secondary | ICD-10-CM | POA: Diagnosis present

## 2020-03-05 DIAGNOSIS — Z20822 Contact with and (suspected) exposure to covid-19: Secondary | ICD-10-CM | POA: Diagnosis present

## 2020-03-05 DIAGNOSIS — Z9049 Acquired absence of other specified parts of digestive tract: Secondary | ICD-10-CM | POA: Diagnosis not present

## 2020-03-05 DIAGNOSIS — R6521 Severe sepsis with septic shock: Secondary | ICD-10-CM | POA: Diagnosis present

## 2020-03-05 DIAGNOSIS — Z853 Personal history of malignant neoplasm of breast: Secondary | ICD-10-CM | POA: Diagnosis not present

## 2020-03-05 DIAGNOSIS — R410 Disorientation, unspecified: Secondary | ICD-10-CM | POA: Diagnosis present

## 2020-03-05 DIAGNOSIS — Z923 Personal history of irradiation: Secondary | ICD-10-CM | POA: Diagnosis not present

## 2020-03-05 DIAGNOSIS — Z90710 Acquired absence of both cervix and uterus: Secondary | ICD-10-CM | POA: Diagnosis not present

## 2020-03-05 DIAGNOSIS — Z7901 Long term (current) use of anticoagulants: Secondary | ICD-10-CM | POA: Diagnosis not present

## 2020-03-05 DIAGNOSIS — Z79899 Other long term (current) drug therapy: Secondary | ICD-10-CM | POA: Diagnosis not present

## 2020-03-05 DIAGNOSIS — E785 Hyperlipidemia, unspecified: Secondary | ICD-10-CM | POA: Diagnosis present

## 2020-03-05 LAB — BASIC METABOLIC PANEL
Anion gap: 8 (ref 5–15)
BUN: 12 mg/dL (ref 8–23)
CO2: 24 mmol/L (ref 22–32)
Calcium: 8.4 mg/dL — ABNORMAL LOW (ref 8.9–10.3)
Chloride: 109 mmol/L (ref 98–111)
Creatinine, Ser: 1.02 mg/dL — ABNORMAL HIGH (ref 0.44–1.00)
GFR calc Af Amer: 58 mL/min — ABNORMAL LOW (ref >60)
GFR calc non Af Amer: 50 mL/min — ABNORMAL LOW (ref >60)
Glucose, Bld: 117 mg/dL — ABNORMAL HIGH (ref 70–99)
Potassium: 4.2 mmol/L (ref 3.5–5.1)
Sodium: 141 mmol/L (ref 135–145)

## 2020-03-05 LAB — GLUCOSE, CAPILLARY: Glucose-Capillary: 82 mg/dL (ref 70–99)

## 2020-03-05 LAB — TSH: TSH: 2.493 u[IU]/mL (ref 0.350–4.500)

## 2020-03-05 LAB — MRSA PCR SCREENING: MRSA by PCR: NEGATIVE

## 2020-03-05 LAB — CBC
HCT: 34.7 % — ABNORMAL LOW (ref 36.0–46.0)
Hemoglobin: 11.3 g/dL — ABNORMAL LOW (ref 12.0–15.0)
MCH: 32.2 pg (ref 26.0–34.0)
MCHC: 32.6 g/dL (ref 30.0–36.0)
MCV: 98.9 fL (ref 80.0–100.0)
Platelets: 206 10*3/uL (ref 150–400)
RBC: 3.51 MIL/uL — ABNORMAL LOW (ref 3.87–5.11)
RDW: 13.4 % (ref 11.5–15.5)
WBC: 16.7 10*3/uL — ABNORMAL HIGH (ref 4.0–10.5)
nRBC: 0 % (ref 0.0–0.2)

## 2020-03-05 LAB — SARS CORONAVIRUS 2 BY RT PCR (HOSPITAL ORDER, PERFORMED IN ~~LOC~~ HOSPITAL LAB): SARS Coronavirus 2: NEGATIVE

## 2020-03-05 LAB — ECHOCARDIOGRAM COMPLETE
Height: 60 in
Weight: 2064 oz

## 2020-03-05 LAB — TROPONIN I (HIGH SENSITIVITY): Troponin I (High Sensitivity): 7 ng/L (ref <18)

## 2020-03-05 MED ORDER — POLYETHYLENE GLYCOL 3350 17 G PO PACK: 17.0000 g | PACK | Freq: Every day | ORAL | Status: DC | PRN

## 2020-03-05 MED ORDER — PHENYLEPHRINE HCL-NACL 10-0.9 MG/250ML-% IV SOLN
25.0000 ug/min | INTRAVENOUS | Status: DC
  Filled 2020-03-05: qty 250

## 2020-03-05 MED ORDER — CHLORHEXIDINE GLUCONATE CLOTH 2 % EX PADS
6.0000 | MEDICATED_PAD | Freq: Every day | CUTANEOUS | Status: DC
  Administered 2020-03-06: 6 via TOPICAL

## 2020-03-05 MED ORDER — LACTATED RINGERS IV SOLN
INTRAVENOUS | Status: DC
  Administered 2020-03-06: 100 mL/h via INTRAVENOUS

## 2020-03-05 MED ORDER — LACTATED RINGERS IV BOLUS
500.0000 mL | Freq: Once | INTRAVENOUS | Status: AC
  Administered 2020-03-05: 500 mL via INTRAVENOUS

## 2020-03-05 MED ORDER — ESCITALOPRAM OXALATE 10 MG PO TABS
10.0000 mg | ORAL_TABLET | Freq: Every day | ORAL | Status: DC
  Administered 2020-03-05 – 2020-03-07 (×3): 10 mg via ORAL
  Filled 2020-03-05 (×3): qty 1

## 2020-03-05 MED ORDER — ACETAMINOPHEN 325 MG PO TABS: 650.0000 mg | ORAL_TABLET | ORAL | Status: DC | PRN

## 2020-03-05 MED ORDER — LACTATED RINGERS IV BOLUS (SEPSIS)
500.0000 mL | Freq: Once | INTRAVENOUS | Status: AC
  Administered 2020-03-05: 500 mL via INTRAVENOUS

## 2020-03-05 MED ORDER — SODIUM CHLORIDE 0.9 % IV SOLN: 250.0000 mL | INTRAVENOUS | Status: DC

## 2020-03-05 MED ORDER — ONDANSETRON HCL 4 MG/2ML IJ SOLN: 4.0000 mg | Freq: Four times a day (QID) | INTRAMUSCULAR | Status: DC | PRN

## 2020-03-05 MED ORDER — MIDODRINE HCL 5 MG PO TABS
5.0000 mg | ORAL_TABLET | Freq: Three times a day (TID) | ORAL | Status: DC
  Administered 2020-03-05 – 2020-03-06 (×2): 5 mg via ORAL
  Filled 2020-03-05 (×2): qty 1

## 2020-03-05 MED ORDER — LACTATED RINGERS IV BOLUS
1000.0000 mL | Freq: Once | INTRAVENOUS | Status: AC
  Administered 2020-03-05: 1000 mL via INTRAVENOUS

## 2020-03-05 MED ORDER — SODIUM CHLORIDE 0.9 % IV SOLN
25.0000 ug/min | INTRAVENOUS | Status: DC
  Administered 2020-03-05: 25 ug/min via INTRAVENOUS
  Filled 2020-03-05: qty 10
  Filled 2020-03-05: qty 1

## 2020-03-05 MED ORDER — DILTIAZEM HCL 25 MG/5ML IV SOLN
INTRAVENOUS | Status: AC
  Administered 2020-03-05: 15 mg via INTRAVENOUS
  Filled 2020-03-05: qty 5

## 2020-03-05 MED ORDER — DOCUSATE SODIUM 100 MG PO CAPS: 100.0000 mg | ORAL_CAPSULE | Freq: Two times a day (BID) | ORAL | Status: DC | PRN

## 2020-03-05 MED ORDER — DILTIAZEM HCL 25 MG/5ML IV SOLN: 15.0000 mg | INTRAVENOUS | Status: AC

## 2020-03-05 MED ORDER — APIXABAN 2.5 MG PO TABS
2.5000 mg | ORAL_TABLET | Freq: Two times a day (BID) | ORAL | Status: DC
  Administered 2020-03-05 – 2020-03-07 (×5): 2.5 mg via ORAL
  Filled 2020-03-05 (×7): qty 1

## 2020-03-05 MED ORDER — LEVOTHYROXINE SODIUM 50 MCG PO TABS
50.0000 ug | ORAL_TABLET | Freq: Every day | ORAL | Status: DC
  Administered 2020-03-05 – 2020-03-07 (×3): 50 ug via ORAL
  Filled 2020-03-05 (×3): qty 1

## 2020-03-05 MED ORDER — PANTOPRAZOLE SODIUM 40 MG PO TBEC
40.0000 mg | DELAYED_RELEASE_TABLET | Freq: Every day | ORAL | Status: DC
  Administered 2020-03-05 – 2020-03-07 (×3): 40 mg via ORAL
  Filled 2020-03-05 (×3): qty 1

## 2020-03-05 NOTE — Sepsis Progress Note (Signed)
Notified bedside nurse of need to administer fluid bolus.  

## 2020-03-05 NOTE — ED Notes (Signed)
Pt transported to ICU 10 at this time by this RN

## 2020-03-05 NOTE — ED Notes (Signed)
Only approx 5mg  diltiazem given d/t new BP reading low. PT placed in trendelenburg. Started fluids. EDP aware.

## 2020-03-05 NOTE — Progress Notes (Signed)
*  PRELIMINARY RESULTS* Echocardiogram 2D Echocardiogram has been performed.  Sherrie Sport 03/05/2020, 10:53 AM

## 2020-03-05 NOTE — ED Notes (Signed)
Patient's brief and bed pad changed. New purewick placed. Repositioned in bed. No further needs expressed at this time.

## 2020-03-05 NOTE — Progress Notes (Signed)
ANTICOAGULATION CONSULT NOTE - Initial Consult  Pharmacy Consult for eliquis Indication: atrial fibrillation  Allergies  Allergen Reactions  . Bactrim [Sulfamethoxazole-Trimethoprim] Hives  . Iodinated Diagnostic Agents Anaphylaxis  . Codeine Nausea And Vomiting  . Erythromycin Ethylsuccinate Other (See Comments)    Unknown   . Phenobarbital Other (See Comments)    Feeling funny, nervous  . Ciprofloxacin Rash  . Penicillin V Potassium Itching and Rash    Has patient had a PCN reaction causing immediate rash, facial/tongue/throat swelling, SOB or lightheadedness with hypotension: No Has patient had a PCN reaction causing severe rash involving mucus membranes or skin necrosis: No Has patient had a PCN reaction that required hospitalization: No Has patient had a PCN reaction occurring within the last 10 years: No If all of the above answers are "NO", then may proceed with Cephalosporin use.     Patient Measurements: Height: 5' (152.4 cm) Weight: 58.5 kg (129 lb) IBW/kg (Calculated) : 45.5  Vital Signs: Temp: 99.8 F (37.7 C) (05/20 0117) Temp Source: Rectal (05/19 2318) BP: 98/57 (05/20 0315) Pulse Rate: 92 (05/20 0315)  Labs: Recent Labs    03/04/20 2253  HGB 13.0  HCT 39.0  PLT 226  APTT 38*  LABPROT 14.0  INR 1.1  CREATININE 1.09*  TROPONINIHS 7    Estimated Creatinine Clearance: 29.7 mL/min (A) (by C-G formula based on SCr of 1.09 mg/dL (H)).   Medical History: Past Medical History:  Diagnosis Date  . Arthritis   . Atrial fibrillation ([place])   . Breast cancer ([place]) 2006   right breast ca with lumpectomy and rad tx  . Colon polyp   . Cystocele   . Depression   . Female bladder prolapse   . GERD (gastroesophageal reflux disease)   . Goiter   . Hyperlipemia   . Hypothyroidism   . Osteoporosis   . Personal history of radiation therapy 2006   right breast ca  . Pneumonia   . Procidentia of uterus   . Recurrent UTI   . Reflux   . TIA (transient  ischemic attack)   . Vaginal atrophy     Medications:  Scheduled:  . apixaban  2.5 mg Oral BID    Assessment: Patient arrives to ED for c/o fevers, chills w/ h/o afib, COPD, HTN is in afib w/ RVR on anticoagulation w/ eliquis PTA. Patient is being restarted on eliquis for continued anticoagulation w/ eliquis, assuming patient is not going to require any procedures in-house.  Goal of Therapy:  Monitor platelets by anticoagulation protocol: Yes   Plan:  Patient takes eliquis 2.5 mg po twice daily. Given that patient is > 53 years of age and total body weight is < 60 kg, will continue eliquis dose of 2.5 mg po twice daily and will continue to monitor daily CBC's. Renal function currently appears stable, maybe an element of CKD, not clear given last Scr was a year ago.  Will continue to monitor.  Tobie Lords, PharmD, BCPS Clinical Pharmacist 03/05/2020,3:27 AM

## 2020-03-05 NOTE — ED Notes (Signed)
purewick malfunctioned, pt cleaned up and full linen change.

## 2020-03-05 NOTE — ED Notes (Signed)
PT in CT.

## 2020-03-05 NOTE — H&P (Addendum)
Name: Sheryl Michael MRN: [id number] DOB: [birth year]    ADMISSION DATE:  03/04/2020 CONSULTATION DATE:  03/05/2020  REFERRING MD :  Dr. Karma Greaser  CHIEF COMPLAINT:  Altered Mental Status, fever  BRIEF PATIENT DESCRIPTION:  86 y.o. Female admitted 03/05/20 with septic shock secondary to UTI along with Atrial fibrillation w/ RVR.  SIGNIFICANT EVENTS  5/19: Presented to ED 5/20: Became hypotensive while in ED, pressors initiated,PCCM asked to admit to ICU  STUDIES:  5/19: CXR>>Single frontal view of the chest demonstrates an unremarkable cardiac silhouette. No acute airspace disease, effusion, or pneumothorax. Scattered areas of scarring are stable, most pronounced within the lateral segment right middle lobe as seen on previous CT. Prior healed bilateral rib fractures. Soft tissue calcifications right breast. 5/20: CT Renal Stone study>>1. No acute abdominopelvic abnormality. 2. There are few tiny pockets gas in the urinary bladder. Correlation with urinalysis is recommended. This may be related to prior instrumentation. 3. Rectosigmoid diverticulosis without acute inflammation.  CULTURES: Blood x2 5/19>> Urine  5/19>> SARS-CoV-2 PCR 5/20>> Negative  ANTIBIOTICS: Ceftriaxone 5/19>>  HISTORY OF PRESENT ILLNESS:   Sheryl Michael is a 86 year old female with a past medical history significant for atrial fibrillation on Eliquis, breast cancer, GERD, goiter, hypothyroidism, hyperlipidemia, hypertension, TIA, recurrent UTIs who presents to [facility] ED on 03/04/2020 due to altered mental status and fever.  Patiently is currently altered, therefore history is obtained from ED and nursing notes along with patient's granddaughter at bedside.  Apparently the patient had not been feeling well for a few days, and 2 days ago went to her primary care doctor for a B12 shot of which they checked a urinalysis which was positive for UTI.  She was started on Macrobid and had received a total of 4 doses.   Despite outpatient antibiotics, she has had worsening mental status, temperatures as high as 103 at home, along with chills, which prompted her granddaughter to bring her in for evaluation.  Patient denies chest pain, shortness of breath, cough, abdominal pain, nausea, vomiting.  Upon presentation to the ED temperature was 98.6, respiratory rate 27, heart rate 141 (atrial fibrillation with RVR), blood pressure 131/74, and 95% O2 saturations on room air.  Shortly after she did develop temperature of 102 rectally.  Initial work-up in the ED reveals WBC 18.3 with neutrophilia, procalcitonin 0.9, lactic acid 1.3, high-sensitivity troponin 7, BUN 16, creatinine 1.09.  EKG confirms atrial fibrillation with RVR.  Chest x-ray is negative for any acute cardiopulmonary disease.  Urinalysis is not impressive, however she had previously received 4 doses of antibiotics.  She met sepsis criteria therefore she received IV fluids and was placed on IV Rocephin.  She was given 15 mg IV Cardizem push x1 dose with improvement in heart rate.  However she  became hypotensive despite fluid resuscitation, requiring initiation of vasopressors.  PCCM is asked to admit the patient for further work-up and treatment of septic shock secondary to UTI, along with atrial fibrillation with RVR.  PAST MEDICAL HISTORY :   has a past medical history of Arthritis, Atrial fibrillation ([place]), Breast cancer ([place]) (2006), Colon polyp, Cystocele, Depression, Female bladder prolapse, GERD (gastroesophageal reflux disease), Goiter, Hyperlipemia, Hypothyroidism, Osteoporosis, Personal history of radiation therapy (2006), Pneumonia, Procidentia of uterus, Recurrent UTI, Reflux, TIA (transient ischemic attack), and Vaginal atrophy.  has a past surgical history that includes Dilation and curettage of uterus; Appendectomy; Vaginal hysterectomy (Bilateral, 12/21/2015); Cystocele repair (N/A, 12/21/2015); Breast surgery (Right); Cataract extraction w/  intraocular lens  implant,  bilateral; Colonoscopy; Eye surgery; Electormagnetic navigation bronchoscopy (Right, 12/13/2018); Breast biopsy (Right, 2006); and Breast lumpectomy (Right, 2006). Prior to Admission medications   Medication Sig Start Date End Date Taking? Authorizing Provider  acetaminophen (TYLENOL) 325 MG tablet Take 650 mg by mouth every 6 (six) hours as needed for moderate pain.   Yes [provider]  apixaban (ELIQUIS) 2.5 MG TABS tablet Take 1 tablet (2.5 mg total) by mouth 2 (two) times daily. Patient taking differently: Take 2.5 mg by mouth every 12 (twelve) hours.  05/31/17  Yes Demetrios Loll, MD  cyanocobalamin (,VITAMIN B-12,) 1000 MCG/ML injection Inject 1,000 mcg into the muscle every 28 (twenty-eight) days. 03/03/20  Yes [provider]  diltiazem (TIAZAC) 180 MG 24 hr capsule Take 180 mg by mouth daily. 11/26/19  Yes [provider]  escitalopram (LEXAPRO) 10 MG tablet Take 10 mg by mouth daily.    Yes [provider]  levothyroxine (SYNTHROID, LEVOTHROID) 50 MCG tablet Take 50 mcg by mouth daily before breakfast.   Yes [provider]  nitrofurantoin, macrocrystal-monohydrate, (MACROBID) 100 MG capsule Take 100 mg by mouth 2 (two) times daily. 03/03/20  Yes [provider]  omeprazole (PRILOSEC OTC) 20 MG tablet Take 20 mg by mouth daily.    Yes [provider]  oxybutynin (DITROPAN) 5 MG tablet Take 5 mg by mouth 2 (two) times daily. 05/17/19 05/16/20 Yes [provider]  sodium chloride (OCEAN) 0.65 % SOLN nasal spray Place 1 spray into both nostrils daily as needed for congestion.   Yes [provider]  conjugated estrogens (PREMARIN) vaginal cream Place 0.5 g vaginally 2 (two) times a week. 09/05/19   [provider]  diphenhydrAMINE (BENADRYL) 25 MG tablet Take 25 mg by mouth every 6 (six) hours as needed.    [provider]   Allergies  Allergen Reactions  . Bactrim  [Sulfamethoxazole-Trimethoprim] Hives  . Iodinated Diagnostic Agents Anaphylaxis  . Codeine Nausea And Vomiting  . Erythromycin Ethylsuccinate Other (See Comments)    Unknown   . Phenobarbital Other (See Comments)    Feeling funny, nervous  . Ciprofloxacin Rash  . Penicillin V Potassium Itching and Rash    Has patient had a PCN reaction causing immediate rash, facial/tongue/throat swelling, SOB or lightheadedness with hypotension: No Has patient had a PCN reaction causing severe rash involving mucus membranes or skin necrosis: No Has patient had a PCN reaction that required hospitalization: No Has patient had a PCN reaction occurring within the last 10 years: No If all of the above answers are "NO", then may proceed with Cephalosporin use.     FAMILY HISTORY:  family history includes Breast cancer in her sister; Diabetes in her brother, sister, and sister. SOCIAL HISTORY:  reports that she has never smoked. She has never used smokeless tobacco. She reports that she does not drink alcohol or use drugs.   COVID-19 DISASTER DECLARATION:  FULL CONTACT PHYSICAL EXAMINATION WAS NOT POSSIBLE DUE TO TREATMENT OF COVID-19 AND  CONSERVATION OF PERSONAL PROTECTIVE EQUIPMENT, LIMITED EXAM FINDINGS INCLUDE-  Patient assessed or the symptoms described in the history of present illness.  In the context of the Global COVID-19 pandemic, which necessitated consideration that the patient might be at risk for infection with the SARS-CoV-2 virus that causes COVID-19, Institutional protocols and algorithms that pertain to the evaluation of patients at risk for COVID-19 are in a state of rapid change based on information released by regulatory bodies including the CDC and federal and state  organizations. These policies and algorithms were followed during the patient's care while in hospital.  REVIEW OF SYSTEMS:  Positives in BOLD Constitutional: Negative for fever, +chills, weight loss, malaise/fatigue  and diaphoresis.  HENT: Negative for hearing loss, ear pain, nosebleeds, congestion, sore throat, neck pain, tinnitus and ear discharge.   Eyes: Negative for blurred vision, double vision, photophobia, pain, discharge and redness.  Respiratory: Negative for cough, hemoptysis, sputum production, shortness of breath, wheezing and stridor.   Cardiovascular: Negative for chest pain, palpitations, orthopnea, claudication, leg swelling and PND.  Gastrointestinal: Negative for heartburn, nausea, vomiting, abdominal pain, diarrhea, constipation, blood in stool and melena.  Genitourinary: Negative for dysuria, urgency, frequency, hematuria and flank pain.  Musculoskeletal: Negative for myalgias, back pain, joint pain and falls.  Skin: Negative for itching and rash.  Neurological: Negative for dizziness, tingling, tremors, sensory change, speech change, focal weakness, seizures, loss of consciousness, weakness and headaches.  Endo/Heme/Allergies: Negative for environmental allergies and polydipsia. Does not bruise/bleed easily.  SUBJECTIVE:  Pt reports chills Denies chest pain, palpitations, dizziness, shortness of breath, cough, abdominal pain, N/V/D, edema On room air  VITAL SIGNS: Temp:  [98.6 F (37 C)-102.1 F (38.9 C)] 99.8 F (37.7 C) (05/20 0117) Pulse Rate:  [29-155] 117 (05/20 0210) Resp:  [16-27] 23 (05/20 0210) BP: (74-140)/(40-74) 88/43 (05/20 0210) SpO2:  [93 %-99 %] 97 % (05/20 0210) Weight:  [58.5 kg] 58.5 kg (05/19 2224)  PHYSICAL EXAMINATION: General: Acutely ill-appearing female, laying in bed, on room air, no acute distress Neuro: Sleeping, arouses to voice, oriented x3, follows commands, no focal deficits, speech clear HEENT: Atraumatic, normocephalic, neck supple, no no JVD Cardiovascular: Irregularly irregular rhythm, rate better controlled in the low 100s, 2+ pulses throughout Lungs: Clear to auscultation bilaterally, even, nonlabored, normal effort Abdomen: Soft,  nontender, nondistended, no guarding or rebound tenderness, bowel sounds positive x4 Musculoskeletal: Normal bulk and tone, no deformities, no edema Skin: Warm and dry.  No obvious rashes, lesions or ulcerations  Recent Labs  Lab 03/04/20 2253  NA 135  K 4.1  CL 103  CO2 23  BUN 16  CREATININE 1.09*  GLUCOSE 120*   Recent Labs  Lab 03/04/20 2253  HGB 13.0  HCT 39.0  WBC 18.3*  PLT 226   DG Chest Portable 1 View  Result Date: 03/04/2020 CLINICAL DATA:  Urinary tract infection, fever, intermittent cough EXAM: PORTABLE CHEST 1 VIEW COMPARISON:  08/25/2017, 12/13/2018 FINDINGS: Single frontal view of the chest demonstrates an unremarkable cardiac silhouette. No acute airspace disease, effusion, or pneumothorax. Scattered areas of scarring are stable, most pronounced within the lateral segment right middle lobe as seen on previous CT. Prior healed bilateral rib fractures. Soft tissue calcifications right breast. IMPRESSION: 1. Chronic scarring, no acute airspace disease. Electronically Signed   By: Randa Ngo M.D.   On: 03/04/2020 23:45   CT Renal Stone Study  Result Date: 03/05/2020 CLINICAL DATA:  Fever and chills EXAM: CT ABDOMEN AND PELVIS WITHOUT CONTRAST TECHNIQUE: Multidetector CT imaging of the abdomen and pelvis was performed following the standard protocol without IV contrast. COMPARISON:  September 14, 2004 FINDINGS: Lower chest: There is a calcified granuloma at the left lung base, otherwise the lungs are clear.The heart size is normal. Hepatobiliary: The liver is normal. Normal gallbladder.There is no biliary ductal dilation. Pancreas: Normal contours without ductal dilatation. No peripancreatic fluid collection. Spleen: Unremarkable. Adrenals/Urinary Tract: --Adrenal glands: Unremarkable. --Right kidney/ureter: No hydronephrosis or radiopaque kidney stones. --Left kidney/ureter: No hydronephrosis or radiopaque kidney  stones. --Urinary bladder: There are few tiny pockets gas  in the urinary bladder. Stomach/Bowel: --Stomach/Duodenum: No hiatal hernia or other gastric abnormality. Normal duodenal course and caliber. --Small bowel: Unremarkable. --Colon: Rectosigmoid diverticulosis without acute inflammation. --Appendix: Normal. Vascular/Lymphatic: Atherosclerotic calcification is present within the non-aneurysmal abdominal aorta, without hemodynamically significant stenosis. --No retroperitoneal lymphadenopathy. --No mesenteric lymphadenopathy. --No pelvic or inguinal lymphadenopathy. Reproductive: Status post hysterectomy. No adnexal mass. There appears to be some degree of pelvic floor laxity. Other: No ascites or free air. The abdominal wall is normal. Musculoskeletal. No acute displaced fractures. IMPRESSION: 1. No acute abdominopelvic abnormality. 2. There are few tiny pockets gas in the urinary bladder. Correlation with urinalysis is recommended. This may be related to prior instrumentation. 3. Rectosigmoid diverticulosis without acute inflammation. Aortic Atherosclerosis (ICD10-I70.0). Electronically Signed   By: Constance Holster M.D.   On: 03/05/2020 00:27    ASSESSMENT / PLAN:  Septic shock secondary to UTI, questionable component of Cardiogenic Shock in setting of Atrial Fibrillation w/ RVR Atrial Fibrillation w/ RVR Hx: HTN, Chronic A-fib on Eliquis -Continuous cardiac monitoring -Maintain MAP greater than 65 -Received IV fluid resuscitation in ED (30 cc/kg) -IV Fluids -Neo-Synephrine as needed to maintain MAP goal -Trend lactic acid -Troponin negative -Received 15 mg IV Cardizem push in ED with improvement in heart rate -Check TSH -Continue home Eliquis for anticoagulation -Obtain 2D Echocardiogram -Consider Cardiology consultation  UTI -Monitor fever curve -Trend WBCs and procalcitonin -Follow cultures as above -Continue Rocephin  Acute metabolic encephalopathy in the setting of UTI and sepsis -Provide supportive care -Treat sepsis and  UTI  Hypothyroidism -Continue home Synthroid -Check TSH       BEST PRACTICES DISPOSITION: ICU GOALS OF CARE: DNI VTE PROPHYLAXIS/ANTICOAGULATION: Home Eliquis CONSULTS: None UPDATES: Updated pt's granddaughter at bedside 03/05/20.  Confirmed code status with pt's granddaughter.  Pt does have a living will, of which family will try and bring in.   Darel Hong, [facility] [place] Pager: [phone]  03/05/2020, 2:30 AM

## 2020-03-05 NOTE — Progress Notes (Signed)
   03/05/20 1135  Clinical Encounter Type  Visited With Patient and family together  Visit Type Initial  Referral From Chaplain  Consult/Referral To Chaplain  While rounding chaplain walked by room and waved at young lady and the second, time, chaplain stopped in. Chaplain met patient's granddaughter, Magda Paganini. Magda Paganini was going to lunch with her father. Chaplain told her that staff will take good care of her grandmother and that she will check in on Ms. Xylia.

## 2020-03-06 DIAGNOSIS — A419 Sepsis, unspecified organism: Principal | ICD-10-CM

## 2020-03-06 DIAGNOSIS — N10 Acute tubulo-interstitial nephritis: Secondary | ICD-10-CM

## 2020-03-06 DIAGNOSIS — R6520 Severe sepsis without septic shock: Secondary | ICD-10-CM

## 2020-03-06 LAB — BASIC METABOLIC PANEL
Anion gap: 6 (ref 5–15)
BUN: 12 mg/dL (ref 8–23)
CO2: 26 mmol/L (ref 22–32)
Calcium: 8 mg/dL — ABNORMAL LOW (ref 8.9–10.3)
Chloride: 109 mmol/L (ref 98–111)
Creatinine, Ser: 0.89 mg/dL (ref 0.44–1.00)
GFR calc Af Amer: >60 mL/min (ref >60)
GFR calc non Af Amer: 59 mL/min — ABNORMAL LOW (ref >60)
Glucose, Bld: 92 mg/dL (ref 70–99)
Potassium: 3.7 mmol/L (ref 3.5–5.1)
Sodium: 141 mmol/L (ref 135–145)

## 2020-03-06 LAB — CBC
HCT: 30.5 % — ABNORMAL LOW (ref 36.0–46.0)
Hemoglobin: 10.1 g/dL — ABNORMAL LOW (ref 12.0–15.0)
MCH: 31.8 pg (ref 26.0–34.0)
MCHC: 33.1 g/dL (ref 30.0–36.0)
MCV: 95.9 fL (ref 80.0–100.0)
Platelets: 165 10*3/uL (ref 150–400)
RBC: 3.18 MIL/uL — ABNORMAL LOW (ref 3.87–5.11)
RDW: 13.6 % (ref 11.5–15.5)
WBC: 7.4 10*3/uL (ref 4.0–10.5)
nRBC: 0 % (ref 0.0–0.2)

## 2020-03-06 LAB — URINE CULTURE: Culture: NO GROWTH

## 2020-03-06 LAB — PROCALCITONIN: Procalcitonin: 0.76 ng/mL

## 2020-03-06 LAB — PHOSPHORUS: Phosphorus: 3.3 mg/dL (ref 2.5–4.6)

## 2020-03-06 LAB — MAGNESIUM: Magnesium: 2 mg/dL (ref 1.7–2.4)

## 2020-03-06 IMAGING — CT CT CHEST SUPER D W/O CM
2 of 4 series · 15 of 36 positions shown, 18 images · non-contrast
Comparison: 11/07/2018

CLINICAL DATA: Preprocedure study for bronchoscopic guidance.

EXAM:
CT CHEST WITHOUT CONTRAST
TECHNIQUE: Multidetector CT imaging of the chest was performed using thin slice
collimation for electromagnetic bronchoscopy planning purposes,
without intravenous contrast.

[Series 5: coronal · coronal · 0.63mm/px · 3 of 151 slices shown]
[im 31/151  lung]
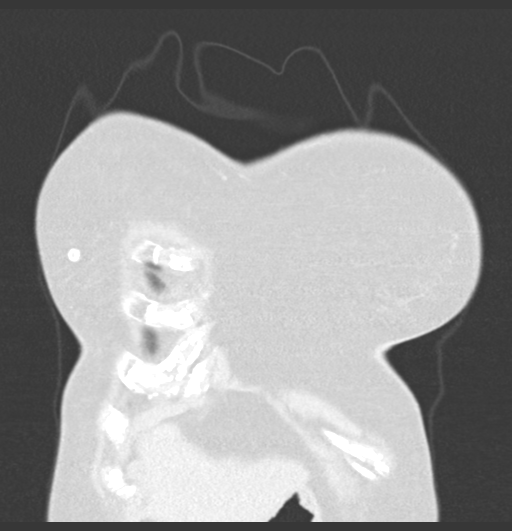
[im 61/151  lung]
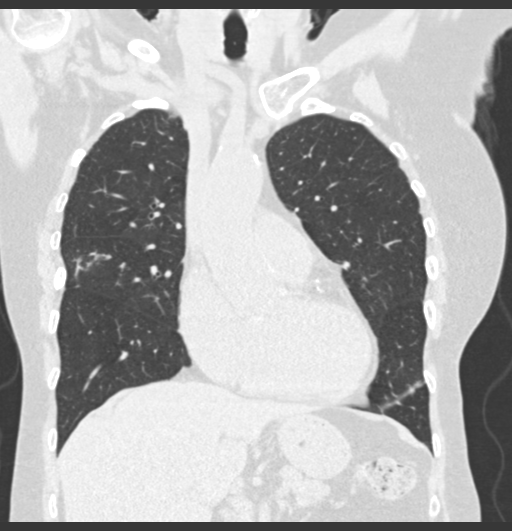
[im 91/151  lung]
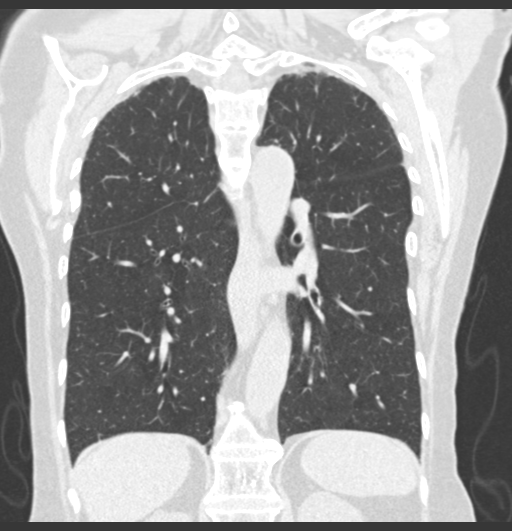

[Series 7: super d · axial · 0.57mm/px · z∈[-210,+84]mm · 12 of 403 slices shown, 15 images]
[im 18/403  mediastinal]
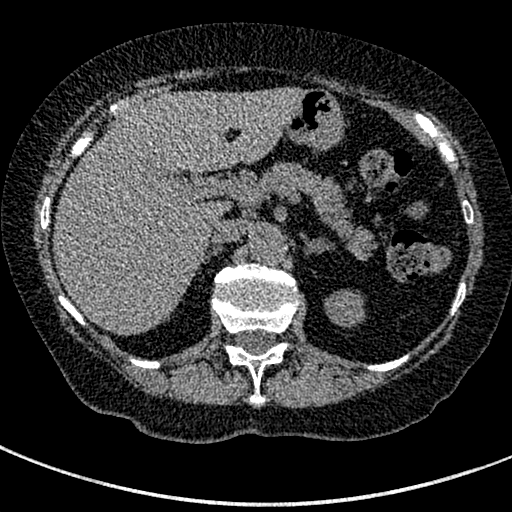
[im 18/403  lung]
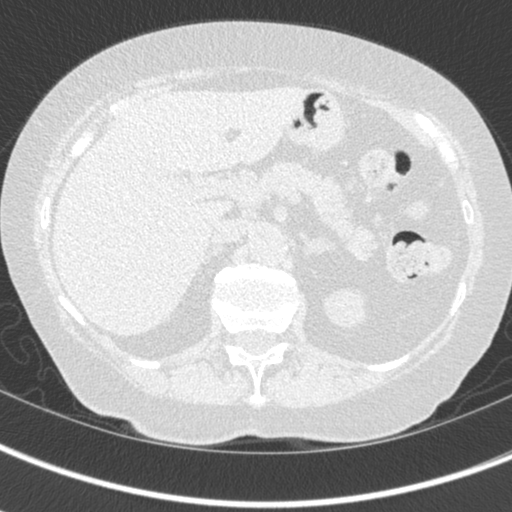
[im 53/403  lung]
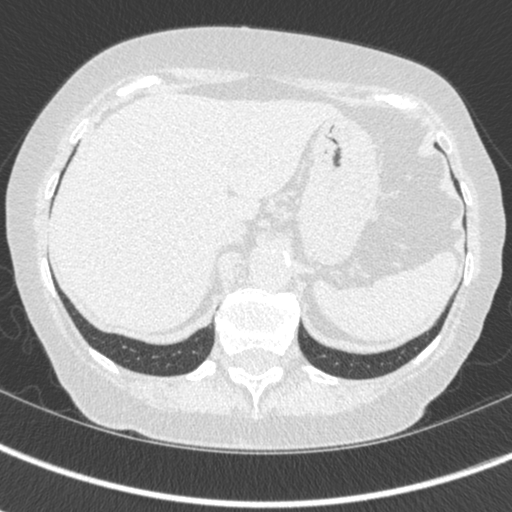
[im 88/403  lung]
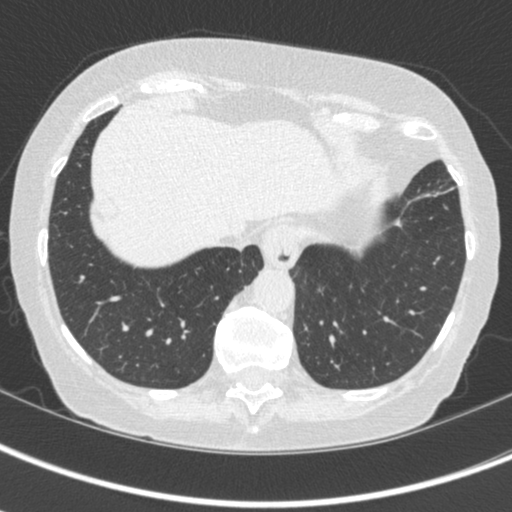
[im 123/403  lung]
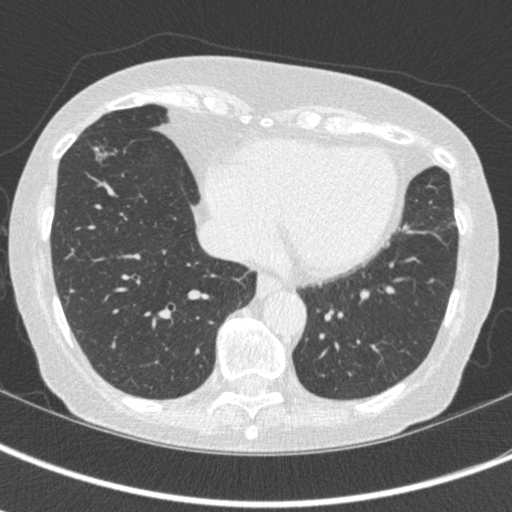
[im 158/403  mediastinal]
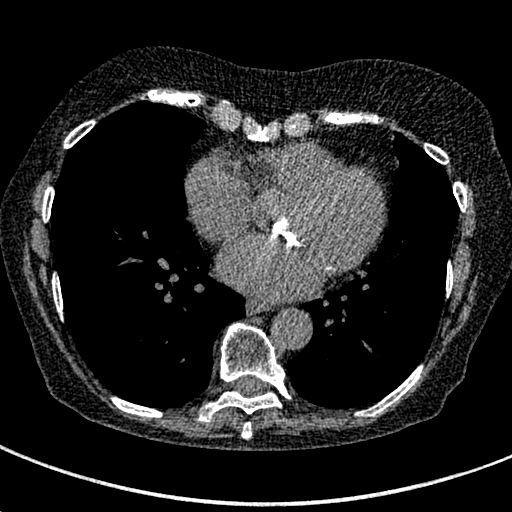
[im 158/403  lung]
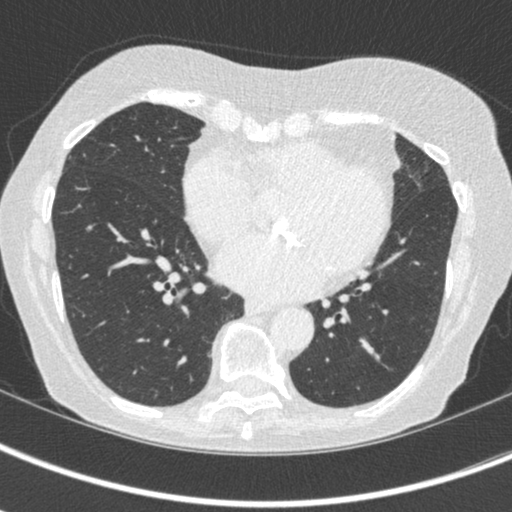
[im 193/403  lung]
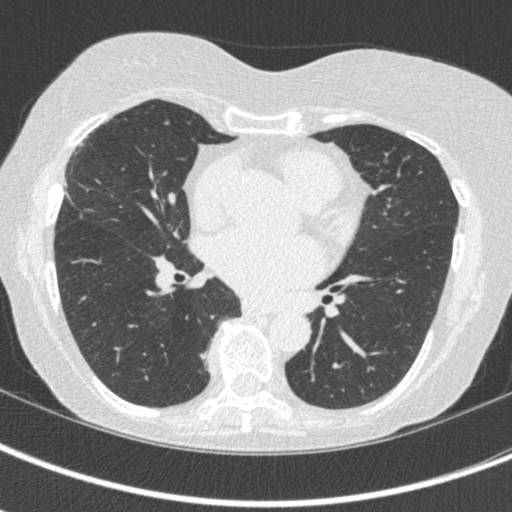
[im 210/403  lung]
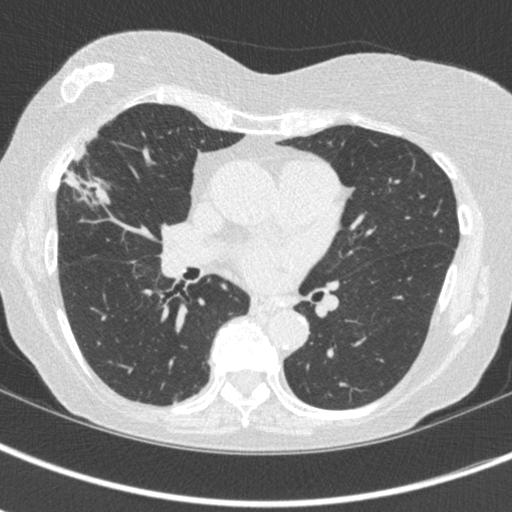
[im 245/403  lung]
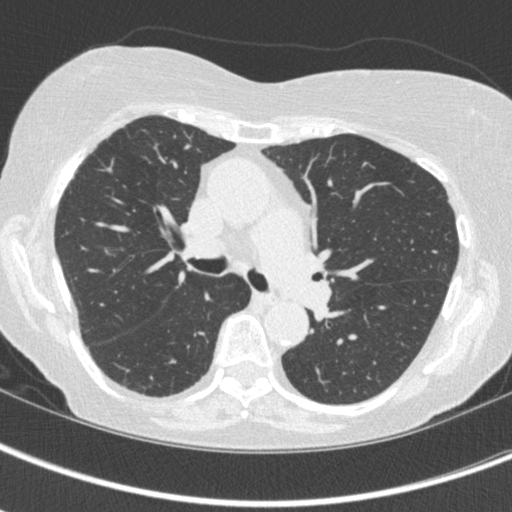
[im 280/403  mediastinal]
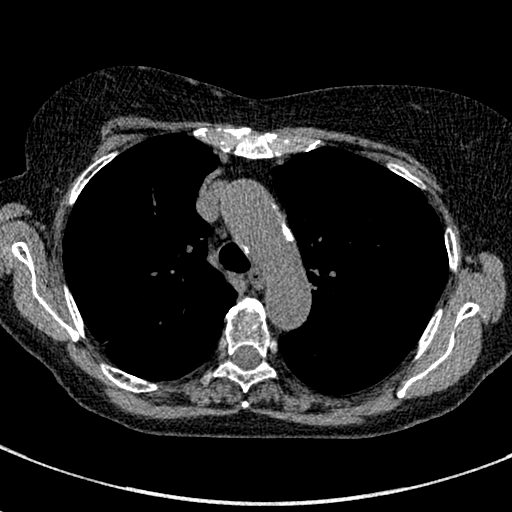
[im 280/403  lung]
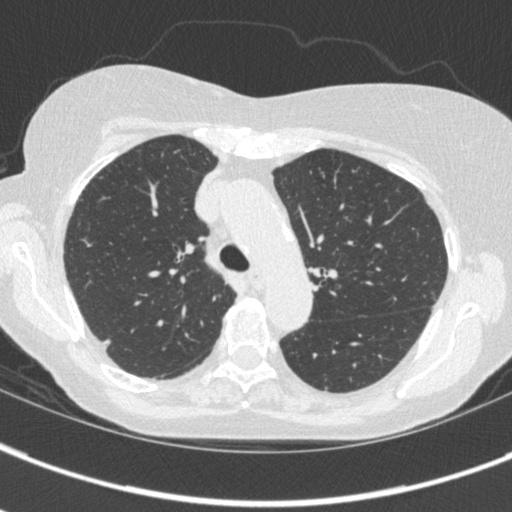
[im 315/403  lung]
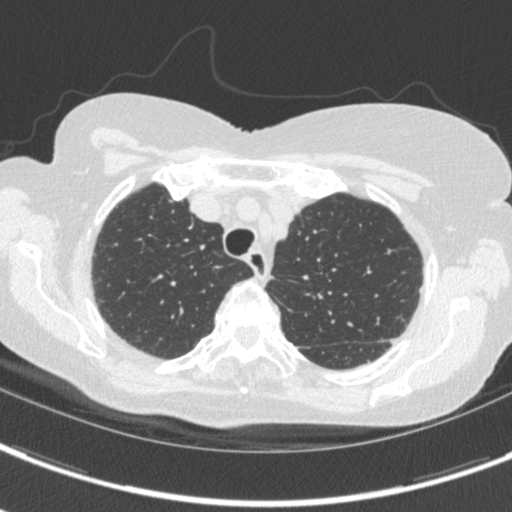
[im 350/403  lung]
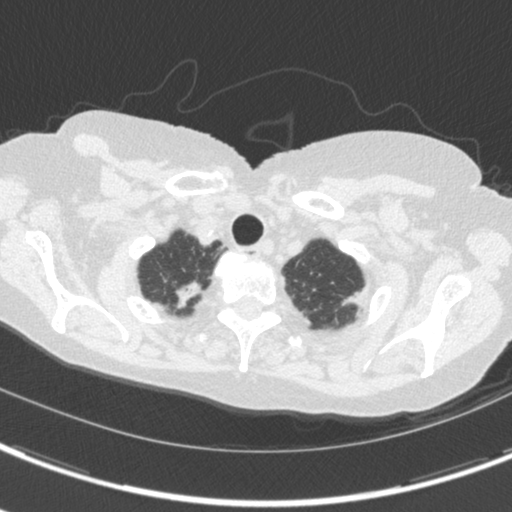
[im 385/403  lung]
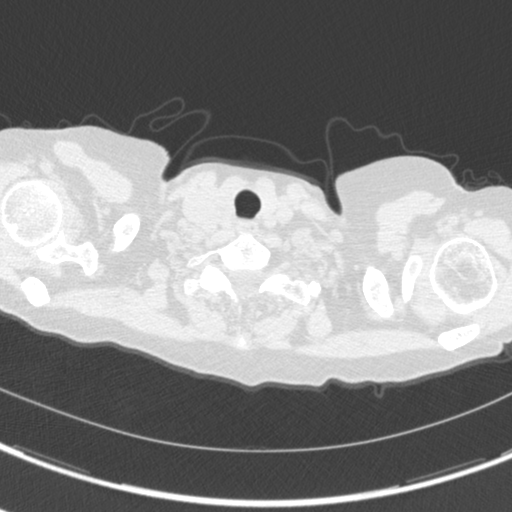

[15 of 36 positions shown; findings below may reference images not displayed]

FINDINGS: Cardiovascular: The heart size is normal. No substantial pericardial
effusion. Coronary artery calcification is evident. Atherosclerotic
calcification is noted in the wall of the thoracic aorta.

Mediastinum/Nodes: No mediastinal lymphadenopathy. No evidence for
gross hilar lymphadenopathy although assessment is limited by the
lack of intravenous contrast on today's study. The esophagus has
normal imaging features. There is no axillary lymphadenopathy.
Similar appearance 13 mm right thyroid nodule.

Lungs/Pleura: The central tracheobronchial airways are patent.
Anterior right middle lobe peripheral wedge-shaped lesions seen on
prior study has become less confluent in the interval (compare image
31/series 3 today to 73/3 previously. This right middle lobe lesion
underlies fractures of the right third through sixth ribs.

Stable minimal biapical pleuroparenchymal scarring. No pleural
effusion. Chronic atelectasis or scarring in the anterior left lower
lobe is stable. Calcification in the posterior left lower lobe
(41/3) is stable. Areas of small airway impaction in the lower lobes
(anterior right lower lobe 47/3) and posterior left lower lobe
(40/3) have decreased in the interval.

Upper Abdomen: Unremarkable.

Musculoskeletal: No worrisome lytic or sclerotic osseous
abnormality.
IMPRESSION: 1. Right middle lobe peripheral lesion in question has become less
confluent in the interval.
2. No new or progressive interval findings.
3. Areas of airway impaction seen previously have decreased in the
interval.
4.  Aortic Atherosclerois ([id number])

## 2020-03-06 MED ORDER — DILTIAZEM HCL 30 MG PO TABS
30.0000 mg | ORAL_TABLET | Freq: Four times a day (QID) | ORAL | Status: AC
  Administered 2020-03-06 – 2020-03-07 (×2): 30 mg via ORAL
  Filled 2020-03-06 (×2): qty 1

## 2020-03-06 MED ORDER — ENSURE ENLIVE PO LIQD
237.0000 mL | Freq: Two times a day (BID) | ORAL | Status: DC
  Administered 2020-03-06 – 2020-03-07 (×3): 237 mL via ORAL

## 2020-03-06 MED ORDER — ALBUTEROL SULFATE (2.5 MG/3ML) 0.083% IN NEBU
2.5000 mg | INHALATION_SOLUTION | Freq: Four times a day (QID) | RESPIRATORY_TRACT | Status: DC | PRN
  Administered 2020-03-06 – 2020-03-07 (×2): 2.5 mg via RESPIRATORY_TRACT
  Filled 2020-03-06 (×2): qty 3

## 2020-03-06 MED ORDER — GUAIFENESIN-DM 100-10 MG/5ML PO SYRP
5.0000 mL | ORAL_SOLUTION | Freq: Four times a day (QID) | ORAL | Status: DC | PRN
  Administered 2020-03-06: 5 mL via ORAL
  Filled 2020-03-06: qty 5

## 2020-03-06 NOTE — Progress Notes (Signed)
   03/06/20 0905  Clinical Encounter Type  Visited With Patient and family together  Visit Type Follow-up  Referral From Chaplain  Consult/Referral To Chaplain  While rounding, chaplain stopped in to check on [name]. When chaplain entered the room, a gentleman was sitting need the wall and Ms. Atzi was eating. Chaplain did not want to stay long because she did not want to interrupt patient's breakfast. Chaplain will check on patient later/

## 2020-03-06 NOTE — Progress Notes (Signed)
Initial Nutrition Assessment  DOCUMENTATION CODES:   Not applicable  INTERVENTION:  Provide Ensure Enlive po BID, each supplement provides 350 kcal and 20 grams of protein. Patient prefers strawberry.  NUTRITION DIAGNOSIS:   Inadequate oral intake related to social / environmental circumstances(unable to cook at home anymore) as evidenced by per patient/family report.  GOAL:   Patient will meet greater than or equal to 90% of their needs  MONITOR:   PO intake, Supplement acceptance, Labs, Weight trends, Skin, I & O's  REASON FOR ASSESSMENT:   Malnutrition Screening Tool    ASSESSMENT:   86 year old female with PMHx of dementia, TIA, depression, GERD, hypothyroidism, A-fib, hx right breast cancer s/p lumpectomy and XRT admitted with sepsis secondary to UTI, AMS, A-fib with RVR.   Met with patient, her son, and her granddaughter at bedside. Patient reports she has had a decreased intake at home lately as she can no longer cook for herself. Family does grocery shopping. Patient may have cottage cheese with peaches, a sandwich made with meat and mayonnaise, or will go out to eat. Patient reports she has been feeling weaker lately. Patient reports she has not been losing weight and actually reports her weight has increased over time. Her UBW was 120 lbs. Recently she had been around 129 lbs. She is now documented to be 64 kg (141.09 lbs). Unsure if this weight is accurate, though. Discussed importance of adequate intake. Patient is amenable to drinking an oral nutrition supplement between meals to help meet calorie/protein needs. She would like to try strawberry Ensure.  Medications reviewed and include: Eliquis, levothyroxine, Protonix, ceftriaxone, LR at 50 mL/hr.  Labs reviewed.  Patient does not meet criteria for malnutrition at this time but is at risk for malnutrition.  Discussed with RN and on rounds.  NUTRITION - FOCUSED PHYSICAL EXAM:    Most Recent Value  Orbital  Region  No depletion  Upper Arm Region  Mild depletion  Thoracic and Lumbar Region  No depletion  Buccal Region  No depletion  Temple Region  No depletion  Clavicle Bone Region  Mild depletion  Clavicle and Acromion Bone Region  Mild depletion  Scapular Bone Region  Unable to assess  Dorsal Hand  Moderate depletion  Patellar Region  Mild depletion  Anterior Thigh Region  Mild depletion  Posterior Calf Region  Mild depletion  Edema (RD Assessment)  None  Hair  Reviewed  Eyes  Reviewed  Mouth  Reviewed  Skin  Reviewed  Nails  Reviewed     Diet Order:   Diet Order            Diet regular Room service appropriate? Yes; Fluid consistency: Thin  Diet effective now             EDUCATION NEEDS:   No education needs have been identified at this time  Skin:  Skin Assessment: Reviewed RN Assessment  Last BM:  03/05/2020  Height:   Ht Readings from Last 1 Encounters:  03/05/20 5' (1.524 m)   Weight:   Wt Readings from Last 1 Encounters:  03/06/20 64 kg   BMI:  Body mass index is 27.56 kg/m.  Estimated Nutritional Needs:   Kcal:  1500-1700  Protein:  75-85 grams  Fluid:  1.5-1.7 L/day   King, MS, RD, LDN Pager number available on Amion 

## 2020-03-06 NOTE — Progress Notes (Signed)
Name: Sheryl Michael MRN: [id number] DOB: Jul 19, 1931    ADMISSION DATE:  03/04/2020 INITIAL CONSULTATION DATE:  03/05/2020  REFERRING MD :  Dr. Karma Greaser  CHIEF COMPLAINT:  Altered Mental Status, fever  BRIEF PATIENT DESCRIPTION:  86 y.o. Female admitted 03/05/20 with septic shock secondary to UTI along with Atrial fibrillation w/ RVR.  SIGNIFICANT EVENTS  5/19: Presented to ED 5/20: Became hypotensive while in ED, pressors initiated,PCCM asked to admit to ICU 5/20: Weaned off pressors after proper volume resuscitation and control of A. fib RVR  STUDIES:  5/19: CXR>>Single frontal view of the chest demonstrates an unremarkable cardiac silhouette. No acute airspace disease, effusion, or pneumothorax. Scattered areas of scarring are stable, most pronounced within the lateral segment right middle lobe as seen on previous CT. Prior healed bilateral rib fractures. Soft tissue calcifications right breast. 5/20: CT Renal Stone study>>1. No acute abdominopelvic abnormality. 2. There are few tiny pockets gas in the urinary bladder. Correlation with urinalysis is recommended. This may be related to prior instrumentation. 3. Rectosigmoid diverticulosis without acute inflammation.  CULTURES: Blood x2 5/19>> Urine  5/19>> SARS-CoV-2 PCR 5/20>> Negative  ANTIBIOTICS: Ceftriaxone 5/19>>  BACKGROUND:   Sheryl Michael is a 86 year old female with a past medical history significant for atrial fibrillation on Eliquis, breast cancer, GERD, goiter, hypothyroidism, hyperlipidemia, hypertension, TIA, recurrent UTIs who presents to [facility] ED on 03/04/2020 due to altered mental status and fever.  Apparently the patient had not been feeling well for a few days, and 2 days ago went to her primary care doctor for a B12 shot of which they checked a urinalysis which was positive for UTI.  She was started on Macrobid and had received a total of 4 doses.  Despite outpatient antibiotics, she has had worsening mental  status, temperatures as high as 103 at home, along with chills, which prompted her granddaughter to bring her in for evaluation.  Upon presentation to the ED temperature was 98.6, respiratory rate 27, heart rate 141 (atrial fibrillation with RVR), blood pressure 131/74, and 95% O2 saturations on room air.  Shortly after she did develop temperature of 102 rectally.   REVIEW OF SYSTEMS:  Patient does not endorse any dyspnea, chest pain, dizziness, shortness of breath, cough, abdominal pain, nausea vomiting.   SUBJECTIVE:  Sitting up in chair, eating breakfast, comfortable on room air.  Voices no complaint.   VITAL SIGNS: Temp:  [98.2 F (36.8 C)-99.5 F (37.5 C)] 98.6 F (37 C) (05/21 0832) Pulse Rate:  [25-81] 58 (05/21 0900) Resp:  [18-28] 20 (05/21 0900) BP: (82-159)/(25-54) 159/54 (05/21 0900) SpO2:  [90 %-98 %] 94 % (05/21 0900) Weight:  [60.6 kg-64 kg] 64 kg (05/21 0337)  PHYSICAL EXAMINATION: General: Frail elderly female, sitting up in chair, on room air, no acute distress Neuro: Awake, alert, oriented x3, follows commands, no focal deficits, speech clear HEENT: Atraumatic, normocephalic, neck supple, no no JVD Cardiovascular: Irregularly irregular rhythm, rate  controlled, 2+ pulses throughout Lungs: Clear to auscultation bilaterally, even, nonlabored, normal effort Abdomen: Soft, nontender, nondistended, no guarding or rebound tenderness, bowel sounds positive x4 Musculoskeletal: Normal bulk and tone, no deformities, no edema Skin: Warm and dry.  No obvious rashes, lesions or ulcerations  Recent Labs  Lab 03/04/20 2253 03/05/20 0418 03/06/20 0504  NA 135 141 141  K 4.1 4.2 3.7  CL 103 109 109  CO2 23 24 26   BUN 16 12 12   CREATININE 1.09* 1.02* 0.89  GLUCOSE 120* 117* 92   Recent  Labs  Lab 03/04/20 2253 03/05/20 0418 03/06/20 0504  HGB 13.0 11.3* 10.1*  HCT 39.0 34.7* 30.5*  WBC 18.3* 16.7* 7.4  PLT 226 206 165   DG Chest Portable 1 View  Result Date:  03/04/2020 CLINICAL DATA:  Urinary tract infection, fever, intermittent cough EXAM: PORTABLE CHEST 1 VIEW COMPARISON:  08/25/2017, 12/13/2018 FINDINGS: Single frontal view of the chest demonstrates an unremarkable cardiac silhouette. No acute airspace disease, effusion, or pneumothorax. Scattered areas of scarring are stable, most pronounced within the lateral segment right middle lobe as seen on previous CT. Prior healed bilateral rib fractures. Soft tissue calcifications right breast. IMPRESSION: 1. Chronic scarring, no acute airspace disease. Electronically Signed   By: Randa Ngo M.D.   On: 03/04/2020 23:45   ECHOCARDIOGRAM COMPLETE  Result Date: 03/05/2020    ECHOCARDIOGRAM REPORT   Patient Name:   Sheryl Michael Date of Exam: 03/05/2020 Medical Rec #:  [id number]     Height:       60.0 in Accession #:    [id number]    Weight:       129.0 lb Date of Birth:  February 11, 1931     BSA:          1.549 m Patient Age:    73 years      BP:           102/25 mmHg Patient Gender: F             HR:           80 bpm. Exam Location:  [facility] Procedure: 2D Echo, Cardiac Doppler and Color Doppler Indications:     Atrial Fibrillation 427.31  History:         Patient has prior history of Echocardiogram examinations, most                  recent 05/31/2017. TIA; Arrythmias:Atrial Fibrillation.  Sonographer:     Sherrie Sport RDCS (AE) Referring Phys:  [id number] Bradly Bienenstock Diagnosing Phys: Isaias Cowman MD IMPRESSIONS  1. Left ventricular ejection fraction, by estimation, is 60 to 65%. The left ventricle has normal function. The left ventricle has no regional wall motion abnormalities. There is mild left ventricular hypertrophy. Left ventricular diastolic parameters were normal.  2. Right ventricular systolic function is normal. The right ventricular size is normal. There is mildly elevated pulmonary artery systolic pressure.  3. The mitral valve is normal in structure. Trivial mitral valve regurgitation. No evidence of mitral  stenosis.  4. The aortic valve is normal in structure. Aortic valve regurgitation is mild. No aortic stenosis is present.  5. The inferior vena cava is normal in size with greater than 50% respiratory variability, suggesting right atrial pressure of 3 mmHg. FINDINGS  Left Ventricle: Left ventricular ejection fraction, by estimation, is 60 to 65%. The left ventricle has normal function. The left ventricle has no regional wall motion abnormalities. The left ventricular internal cavity size was normal in size. There is  mild left ventricular hypertrophy. Left ventricular diastolic parameters were normal. Right Ventricle: The right ventricular size is normal. No increase in right ventricular wall thickness. Right ventricular systolic function is normal. There is mildly elevated pulmonary artery systolic pressure. The tricuspid regurgitant velocity is 2.58  m/s, and with an assumed right atrial pressure of 10 mmHg, the estimated right ventricular systolic pressure is 123XX123 mmHg. Left Atrium: Left atrial size was normal in size. Right Atrium: Right atrial size was normal in size. Pericardium: There is  no evidence of pericardial effusion. Mitral Valve: The mitral valve is normal in structure. Normal mobility of the mitral valve leaflets. Trivial mitral valve regurgitation. No evidence of mitral valve stenosis. Tricuspid Valve: The tricuspid valve is normal in structure. Tricuspid valve regurgitation is trivial. No evidence of tricuspid stenosis. Aortic Valve: The aortic valve is normal in structure. Aortic valve regurgitation is mild. Aortic regurgitation PHT measures 450 msec. No aortic stenosis is present. Aortic valve mean gradient measures 3.5 mmHg. Aortic valve peak gradient measures 6.8 mmHg. Aortic valve area, by VTI measures 3.26 cm. Pulmonic Valve: The pulmonic valve was normal in structure. Pulmonic valve regurgitation is not visualized. No evidence of pulmonic stenosis. Aorta: The aortic root is normal in size  and structure. Venous: The inferior vena cava is normal in size with greater than 50% respiratory variability, suggesting right atrial pressure of 3 mmHg. IAS/Shunts: No atrial level shunt detected by color flow Doppler.  LEFT VENTRICLE PLAX 2D LVIDd:         4.03 cm  Diastology LVIDs:         2.46 cm  LV e' lateral:   5.87 cm/s LV PW:         1.25 cm  LV E/e' lateral: 20.6 LV IVS:        0.81 cm  LV e' medial:    5.98 cm/s LVOT diam:     2.10 cm  LV E/e' medial:  20.2 LV SV:         86 LV SV Index:   56 LVOT Area:     3.46 cm  RIGHT VENTRICLE RV Basal diam:  3.33 cm RV S prime:     14.10 cm/s TAPSE (M-mode): 2.7 cm LEFT ATRIUM           Index       RIGHT ATRIUM           Index LA diam:      3.40 cm 2.19 cm/m  RA Area:     14.30 cm LA Vol (A2C): 43.2 ml 27.88 ml/m RA Volume:   43.30 ml  27.95 ml/m LA Vol (A4C): 17.5 ml 11.30 ml/m  AORTIC VALVE                   PULMONIC VALVE AV Area (Vmax):    3.08 cm    PV Vmax:        0.84 m/s AV Area (Vmean):   3.05 cm    PV Peak grad:   2.8 mmHg AV Area (VTI):     3.26 cm    RVOT Peak grad: 3 mmHg AV Vmax:           130.50 cm/s AV Vmean:          85.500 cm/s AV VTI:            0.264 m AV Peak Grad:      6.8 mmHg AV Mean Grad:      3.5 mmHg LVOT Vmax:         116.00 cm/s LVOT Vmean:        75.200 cm/s LVOT VTI:          0.249 m LVOT/AV VTI ratio: 0.94 AI PHT:            450 msec  AORTA Ao Root diam: 3.20 cm MITRAL VALVE                TRICUSPID VALVE MV Area (PHT): 2.18 cm  TR Peak grad:   26.6 mmHg MV Decel Time: 348 msec     TR Vmax:        258.00 cm/s MV E velocity: 121.00 cm/s MV A velocity: 88.80 cm/s   SHUNTS MV E/A ratio:  1.36         Systemic VTI:  0.25 m                             Systemic Diam: 2.10 cm Isaias Cowman MD Electronically signed by Isaias Cowman MD Signature Date/Time: 03/05/2020/1:28:41 PM    Final    CT Renal Stone Study  Result Date: 03/05/2020 CLINICAL DATA:  Fever and chills EXAM: CT ABDOMEN AND PELVIS WITHOUT CONTRAST  TECHNIQUE: Multidetector CT imaging of the abdomen and pelvis was performed following the standard protocol without IV contrast. COMPARISON:  September 14, 2004 FINDINGS: Lower chest: There is a calcified granuloma at the left lung base, otherwise the lungs are clear.The heart size is normal. Hepatobiliary: The liver is normal. Normal gallbladder.There is no biliary ductal dilation. Pancreas: Normal contours without ductal dilatation. No peripancreatic fluid collection. Spleen: Unremarkable. Adrenals/Urinary Tract: --Adrenal glands: Unremarkable. --Right kidney/ureter: No hydronephrosis or radiopaque kidney stones. --Left kidney/ureter: No hydronephrosis or radiopaque kidney stones. --Urinary bladder: There are few tiny pockets gas in the urinary bladder. Stomach/Bowel: --Stomach/Duodenum: No hiatal hernia or other gastric abnormality. Normal duodenal course and caliber. --Small bowel: Unremarkable. --Colon: Rectosigmoid diverticulosis without acute inflammation. --Appendix: Normal. Vascular/Lymphatic: Atherosclerotic calcification is present within the non-aneurysmal abdominal aorta, without hemodynamically significant stenosis. --No retroperitoneal lymphadenopathy. --No mesenteric lymphadenopathy. --No pelvic or inguinal lymphadenopathy. Reproductive: Status post hysterectomy. No adnexal mass. There appears to be some degree of pelvic floor laxity. Other: No ascites or free air. The abdominal wall is normal. Musculoskeletal. No acute displaced fractures. IMPRESSION: 1. No acute abdominopelvic abnormality. 2. There are few tiny pockets gas in the urinary bladder. Correlation with urinalysis is recommended. This may be related to prior instrumentation. 3. Rectosigmoid diverticulosis without acute inflammation. Aortic Atherosclerosis (ICD10-I70.0). Electronically Signed   By: Constance Holster M.D.   On: 03/05/2020 00:27    ASSESSMENT / PLAN:  Septic shock secondary to UTI, questionable component of Cardiogenic  Shock in setting of Atrial Fibrillation w/ RVR Atrial Fibrillation w/ RVR Hx: HTN, Chronic A-fib on Eliquis -Continuous cardiac monitoring -Maintain MAP greater than 65 -Received IV fluid resuscitation in ED (30 cc/kg) -IV Fluids -Neo-Synephrine weaned off overnight -Trend lactic acid -Troponin negative -Continue home cardiac medications -TSH, normal -Continue home Eliquis for anticoagulation -2D Echocardiogram: LVEF 60 to 65%, mild LVH -Consider Cardiology consultation if RVR recurs  UTI -Monitor fever curve -Trend WBCs and procalcitonin -Follow cultures as above -Continue Rocephin  Acute metabolic encephalopathy in the setting of UTI and sepsis -Provide supportive care -Treat sepsis and UTI  Hypothyroidism -Continue home Synthroid -TSH normal  Patient may be transferred to the [place] floor with telemetry monitoring.  [facility] service notified.  [facility]ACT[facility] DISPOSITION: MedSurg with telemetry GOALS OF CARE: DNI VTE PROPHYLAXIS/ANTICOAGULATION: Home Eliquis CONSULTS: None UPDATES: Updated patient's son at bedside 03/06/2020  C. Derrill Kay, MD [place] [facility]  03/06/2020, 9:10 AM

## 2020-03-06 NOTE — Care Management (Signed)
This is a no charge note  Pick up from PCCM per Dr. Patsey Berthold  86 year old lady with past medical history of atrial fibrillation on Eliquis, hyperlipidemia, TIA, GERD, hypothyroidism, depression, dementia, who is admitted to ICU due to sepsis secondary to UTI, altered mental status and atrial fibrillation with RVR.  Patient is on Rocephin.  She is off pressor now.  Stabilized.  Blood pressure 159/54, heart rate 58. We need to pick up this pt tomorrow AM.    Ivor Costa, MD  [facility]   If 7PM-7AM, please contact night-coverage [email or web address] 03/06/2020, 9:21 AM

## 2020-03-06 NOTE — Progress Notes (Signed)
Notified of heart rate change 126 and A FIb , MEWS yellow B Morrison NP notified and orders taken. Blood pressure stable, pt resting states "I can tell my heart is beating fast" "I have Afib".

## 2020-03-07 DIAGNOSIS — N390 Urinary tract infection, site not specified: Secondary | ICD-10-CM

## 2020-03-07 DIAGNOSIS — I4891 Unspecified atrial fibrillation: Secondary | ICD-10-CM

## 2020-03-07 DIAGNOSIS — Z87440 Personal history of urinary (tract) infections: Secondary | ICD-10-CM | POA: Diagnosis present

## 2020-03-07 LAB — CBC
HCT: 36.3 % (ref 36.0–46.0)
Hemoglobin: 12 g/dL (ref 12.0–15.0)
MCH: 31.6 pg (ref 26.0–34.0)
MCHC: 33.1 g/dL (ref 30.0–36.0)
MCV: 95.5 fL (ref 80.0–100.0)
Platelets: 199 10*3/uL (ref 150–400)
RBC: 3.8 MIL/uL — ABNORMAL LOW (ref 3.87–5.11)
RDW: 13.2 % (ref 11.5–15.5)
WBC: 7.4 10*3/uL (ref 4.0–10.5)
nRBC: 0 % (ref 0.0–0.2)

## 2020-03-07 LAB — BASIC METABOLIC PANEL
Anion gap: 9 (ref 5–15)
BUN: 14 mg/dL (ref 8–23)
CO2: 25 mmol/L (ref 22–32)
Calcium: 8.2 mg/dL — ABNORMAL LOW (ref 8.9–10.3)
Chloride: 107 mmol/L (ref 98–111)
Creatinine, Ser: 0.76 mg/dL (ref 0.44–1.00)
GFR calc Af Amer: >60 mL/min (ref >60)
GFR calc non Af Amer: >60 mL/min (ref >60)
Glucose, Bld: 103 mg/dL — ABNORMAL HIGH (ref 70–99)
Potassium: 3.5 mmol/L (ref 3.5–5.1)
Sodium: 141 mmol/L (ref 135–145)

## 2020-03-07 LAB — PROCALCITONIN: Procalcitonin: 0.39 ng/mL

## 2020-03-07 MED ORDER — ENSURE ENLIVE PO LIQD: 237.0000 mL | Freq: Two times a day (BID) | ORAL | 12 refills | Status: DC

## 2020-03-07 MED ORDER — DILTIAZEM HCL ER COATED BEADS 180 MG PO CP24
180.0000 mg | ORAL_CAPSULE | Freq: Every day | ORAL | Status: DC
  Administered 2020-03-07: 180 mg via ORAL
  Filled 2020-03-07: qty 1

## 2020-03-07 MED ORDER — POTASSIUM CHLORIDE CRYS ER 20 MEQ PO TBCR
40.0000 meq | EXTENDED_RELEASE_TABLET | Freq: Once | ORAL | Status: AC
  Administered 2020-03-07: 40 meq via ORAL
  Filled 2020-03-07: qty 2

## 2020-03-07 NOTE — Evaluation (Signed)
Physical Therapy Evaluation Patient Details Name: Sheryl Michael MRN: [id number] DOB: [birth year] Today's Date: 03/07/2020   History of Present Illness  Per MD note: Spiritual Bacino is a 86 year old female with a past medical history significant for atrial fibrillation on Eliquis, breast cancer, GERD, goiter, hypothyroidism, hyperlipidemia, hypertension, TIA, recurrent UTIs who presents to [facility] ED on 03/04/2020 due to altered mental status and fever.  Apparently the patient had not been feeling well for a few days, and 2 days ago went to her primary care doctor for a B12 shot of which they checked a urinalysis which was positive for UTI.  She was started on Macrobid and had received a total of 4 doses.  Despite outpatient antibiotics, she has had worsening mental status, temperatures as high as 103 at home, along with chills, which prompted her granddaughter to bring her in for evaluation.  Upon presentation to the ED temperature was 98.6, respiratory rate 27, heart rate 141 (atrial fibrillation with RVR), blood pressure 131/74, and 95% O2 saturations on room air.  Clinical Impression  Pt able to complete bed mobility and supine > transfer modI with some increased time needed. Following cuing for set up/hand placement patient is able to complete STS with RW modI, though when she attempts sit without RW, is very unsteady. Educated patient and son on importance of utilizing RW at this time > cane for safety with good understanding. PT agrees with pt son that stairs without bilat UE support are not safe at this time as patient demonstrates decreased balance in transfers without RW, and no SL balance, and would benefit from a ramp at home for safety. Would benefit from skilled PT to address above deficits and promote optimal return to PLOF.     Follow Up Recommendations Home health PT    Equipment Recommendations  Rolling walker with 5" wheels    Recommendations for Other Services       Precautions /  Restrictions Precautions Precautions: Fall Restrictions Weight Bearing Restrictions: No      Mobility  Bed Mobility Overal bed mobility: Modified Independent             General bed mobility comments: increased time needed  Transfers Overall transfer level: Needs assistance Equipment used: Rolling walker (2 wheeled) Transfers: Sit to/from Stand Sit to Stand: Modified independent (Device/Increase time)         General transfer comment: cuing for RW safety and set up with good carry over; without RW patient very unsteady with sit attempt, PT cued to utilize RW  Ambulation/Gait Ambulation/Gait assistance: Modified independent (Device/Increase time) Gait Distance (Feet): 150 Feet Assistive device: Rolling walker (2 wheeled) Gait Pattern/deviations: WFL(Within Functional Limits) Gait velocity: increased   General Gait Details: cuing to "stay close to RW" with good carry over, cuing to decrease speed to notice surroundings  Stairs            Wheelchair Mobility    Modified Rankin (Stroke Patients Only)       Balance Overall balance assessment: Needs assistance   Sitting balance-Leahy Scale: Good     Standing balance support: No upper extremity supported Standing balance-Leahy Scale: Fair Standing balance comment: decent static balance with varied BOS, poor with eyes closed, and severe difficutly in SL positions Single Leg Stance - Right Leg: 0 Single Leg Stance - Left Leg: 0 Tandem Stance - Right Leg: 30 Tandem Stance - Left Leg: 30 Rhomberg - Eyes Opened: 30 Rhomberg - Eyes Closed: 14  Pertinent Vitals/Pain      Home Living Family/patient expects to be discharged to:: Private residence Living Arrangements: Alone Available Help at Discharge: Family Type of Home: House Home Access: Stairs to enter Entrance Stairs-Rails: Can reach both Entrance Stairs-Number of Steps: 4 Home Layout: One level Home Equipment: Walker - 2  wheels;Grab bars - tub/shower;Grab bars - toilet;Cane - single point Additional Comments: Pts son reports patient needs minA to finsh entering stairs d/t handrail ending, pt reports she prefers to use her hurri-cane but that she does have a 2WW    Prior Function Level of Independence: Independent with assistive device(s);Needs assistance   Gait / Transfers Assistance Needed: at top step requires minA from son to entry into home           Hand Dominance        Extremity/Trunk Assessment   Upper Extremity Assessment Upper Extremity Assessment: Overall WFL for tasks assessed    Lower Extremity Assessment Lower Extremity Assessment: Overall WFL for tasks assessed    Cervical / Trunk Assessment Cervical / Trunk Assessment: Kyphotic  Communication   Communication: No difficulties  Cognition Arousal/Alertness: Awake/alert Behavior During Therapy: WFL for tasks assessed/performed Overall Cognitive Status: Within Functional Limits for tasks assessed                                        General Comments      Exercises Other Exercises Other Exercises: STS and ambulation with RW, modI with heavy cuing for RW management needed with decent carry over Other Exercises: balance activites (see above)   Assessment/Plan    PT Assessment Patient needs continued PT services  PT Problem List Decreased strength;Decreased mobility;Decreased balance;Decreased activity tolerance       PT Treatment Interventions DME instruction;Therapeutic activities;Gait training;Therapeutic exercise;Stair training;Balance training;Functional mobility training;Neuromuscular re-education;Manual techniques    PT Goals (Current goals can be found in the Care Plan section)  Acute Rehab PT Goals Patient Stated Goal: go home PT Goal Formulation: With patient/family Time For Goal Achievement: 03/21/20 Potential to Achieve Goals: Fair    Frequency Min 2X/week   Barriers to discharge  Inaccessible home environment needs ramp for safe entry/exit into home    Co-evaluation               AM-PAC PT "6 Clicks" Mobility  Outcome Measure Help needed turning from your back to your side while in a flat bed without using bedrails?: A Little Help needed moving from lying on your back to sitting on the side of a flat bed without using bedrails?: A Little Help needed moving to and from a bed to a chair (including a wheelchair)?: A Little Help needed standing up from a chair using your arms (e.g., wheelchair or bedside chair)?: A Little Help needed to walk in hospital room?: A Little Help needed climbing 3-5 steps with a railing? : A Lot 6 Click Score: 17    End of Session Equipment Utilized During Treatment: Gait belt Activity Tolerance: Patient tolerated treatment well Patient left: in bed;with family/visitor present;with call bell/phone within reach;with bed alarm set Nurse Communication: Mobility status PT Visit Diagnosis: Unsteadiness on feet (R26.81);Other abnormalities of gait and mobility (R26.89);History of falling (Z91.81);Difficulty in walking, not elsewhere classified (R26.2)    Time: 0210-0227 PT Time Calculation (min) (ACUTE ONLY): 17 min   Charges:   PT Evaluation $PT Eval Moderate Complexity: 1 Mod PT Treatments $  Therapeutic Activity: 8-22 mins       Shelton Silvas PT, DPT  Shelton Silvas 03/07/2020, 2:44 PM

## 2020-03-07 NOTE — Discharge Summary (Signed)
Physician Discharge Summary  [name] [id number] DOB: 24-Jul-1931 DOA: 03/04/2020  PCP: Idelle Crouch, MD  Admit date: 03/04/2020 Discharge date: 03/07/2020  Admitted From: home Disposition:  home  Recommendations for Outpatient Follow-up:  1. Follow up with PCP in 1-2 weeks 2. Please obtain BMP/CBC in one week 3. Follow up with your cardiologist as previously scheduled, or sooner if you have issues with your heart racing.  Home Health: Yes, PT  Equipment/Devices: Rolling walker   Discharge Condition: Stable CODE STATUS: Partial (do not intubate)  Diet recommendation: Regular    Discharge Diagnoses: Principal Problem:   Sepsis secondary to UTI [facility]) Active Problems:   GERD (gastroesophageal reflux disease)   HLD (hyperlipidemia)   Atrial fibrillation with RVR (HCC)   Hypothyroidism, acquired   History of recurrent UTIs   Summary of HPI and Hospital Course:   Sheryl Michael is a 86 year old female with a past medical history significant for atrial fibrillation on Eliquis, breast cancer, GERD, goiter, hypothyroidism, hyperlipidemia, hypertension, TIA, recurrent UTIs who presents to [facility] ED on 03/04/2020 due to altered mental status and fever.  Apparently the patient had not been feeling well for a few days, and 2 days ago went to her primary care doctor for a B12 shot of which they checked a urinalysis which was positive for UTI.  She was started on Macrobid and had received a total of 4 doses.  Despite outpatient antibiotics, she has had worsening mental status, temperatures as high as 103 at home, along with chills, which prompted her granddaughter to bring her in for evaluation.  Upon presentation to the ED temperature was 98.6, respiratory rate 27, heart rate 141 (atrial fibrillation with RVR), blood pressure 131/74, and 95% O2 saturations on room air.  Shortly after she did develop temperature of 102 rectally.  SIGNIFICANT EVENTS  5/19: Presented to ED 5/20: Became  hypotensive while in ED, pressors initiated, admitted to ICU 5/20: Weaned off pressors after proper volume resuscitation and control of A. fib RVR  Patient was transferred to hospitalist service on 5/22 with stable vitals.  Her blood pressure had improved and tolerated resuming her home diltiazem.  HR's and BP's controlled.    Urine and blood cultures are negative.    Patient was evaluated by PT for generalized weakness and home health PT and rolling walker were recommended.  Social work consulted and these were set up for patient.  Patient and son at bedside agreeable with plan to d/c home today.   Discharge Instructions   Discharge Instructions    Call MD for:  extreme fatigue   Complete by: As directed    Call MD for:  persistant dizziness or light-headedness   Complete by: As directed    Call MD for:  persistant nausea and vomiting   Complete by: As directed    Call MD for:  temperature >100.4   Complete by: As directed    Diet - low sodium heart healthy   Complete by: As directed    Increase activity slowly   Complete by: As directed      Allergies as of 03/07/2020      Reactions   Bactrim [sulfamethoxazole-trimethoprim] Hives   Iodinated Diagnostic Agents Anaphylaxis   Codeine Nausea And Vomiting   Erythromycin Ethylsuccinate Other (See Comments)   Unknown   Phenobarbital Other (See Comments)   Feeling funny, nervous   Ciprofloxacin Rash   Penicillin V Potassium Itching, Rash   Has patient had a PCN reaction causing immediate  rash, facial/tongue/throat swelling, SOB or lightheadedness with hypotension: No Has patient had a PCN reaction causing severe rash involving mucus membranes or skin necrosis: No Has patient had a PCN reaction that required hospitalization: No Has patient had a PCN reaction occurring within the last 10 years: No If all of the above answers are "NO", then may proceed with Cephalosporin use.      Medication List    STOP taking these  medications   nitrofurantoin (macrocrystal-monohydrate) 100 MG capsule Commonly known as: MACROBID     TAKE these medications   acetaminophen 325 MG tablet Commonly known as: TYLENOL Take 650 mg by mouth every 6 (six) hours as needed for moderate pain.   apixaban 2.5 MG Tabs tablet Commonly known as: ELIQUIS Take 1 tablet (2.5 mg total) by mouth 2 (two) times daily. What changed: when to take this   cyanocobalamin 1000 MCG/ML injection Commonly known as: (VITAMIN B-12) Inject 1,000 mcg into the muscle every 28 (twenty-eight) days.   diltiazem 180 MG 24 hr capsule Commonly known as: TIAZAC Take 180 mg by mouth daily.   diphenhydrAMINE 25 MG tablet Commonly known as: BENADRYL Take 25 mg by mouth every 6 (six) hours as needed.   escitalopram 10 MG tablet Commonly known as: LEXAPRO Take 10 mg by mouth daily.   feeding supplement (ENSURE ENLIVE) Liqd Take 237 mLs by mouth 2 (two) times daily between meals. Start taking on: Mar 08, 2020   levothyroxine 50 MCG tablet Commonly known as: SYNTHROID Take 50 mcg by mouth daily before breakfast.   oxybutynin 5 MG tablet Commonly known as: DITROPAN Take 5 mg by mouth 2 (two) times daily.   Premarin vaginal cream Generic drug: conjugated estrogens Place 0.5 g vaginally 2 (two) times a week.   PriLOSEC OTC 20 MG tablet Generic drug: omeprazole Take 20 mg by mouth daily.   sodium chloride 0.65 % Soln nasal spray Commonly known as: OCEAN Place 1 spray into both nostrils daily as needed for congestion.            Durable Medical Equipment  (From admission, onward)         Start     Ordered   03/07/20 1510  For home use only DME Walker rolling  Once    Question Answer Comment  Walker: With 5 Inch Wheels   Patient needs a walker to treat with the following condition Generalized weakness      03/07/20 1509          Allergies  Allergen Reactions  . Bactrim [Sulfamethoxazole-Trimethoprim] Hives  . Iodinated  Diagnostic Agents Anaphylaxis  . Codeine Nausea And Vomiting  . Erythromycin Ethylsuccinate Other (See Comments)    Unknown   . Phenobarbital Other (See Comments)    Feeling funny, nervous  . Ciprofloxacin Rash  . Penicillin V Potassium Itching and Rash    Has patient had a PCN reaction causing immediate rash, facial/tongue/throat swelling, SOB or lightheadedness with hypotension: No Has patient had a PCN reaction causing severe rash involving mucus membranes or skin necrosis: No Has patient had a PCN reaction that required hospitalization: No Has patient had a PCN reaction occurring within the last 10 years: No If all of the above answers are "NO", then may proceed with Cephalosporin use.        Procedures/Studies: CT HEAD WO CONTRAST  Result Date: 02/09/2020 CLINICAL DATA:  86 year old female with intermittent and steady gait and loss of balance. EXAM: CT HEAD WITHOUT CONTRAST TECHNIQUE: Contiguous axial  images were obtained from the base of the skull through the vertex without intravenous contrast. COMPARISON:  Head CT dated 08/21/2018. FINDINGS: Brain: The ventricles and sulci appropriate size for patient's age. The gray-white matter discrimination is preserved. There is no acute intracranial hemorrhage. No mass effect or midline shift. No extra-axial fluid collection. Vascular: No hyperdense vessel or unexpected calcification. Skull: Normal. Negative for fracture or focal lesion. Sinuses/Orbits: No acute finding. Other: None IMPRESSION: Unremarkable noncontrast CT of the brain. Electronically Signed   By: Anner Crete M.D.   On: 02/09/2020 16:09   DG Chest Portable 1 View  Result Date: 03/04/2020 CLINICAL DATA:  Urinary tract infection, fever, intermittent cough EXAM: PORTABLE CHEST 1 VIEW COMPARISON:  08/25/2017, 12/13/2018 FINDINGS: Single frontal view of the chest demonstrates an unremarkable cardiac silhouette. No acute airspace disease, effusion, or pneumothorax. Scattered  areas of scarring are stable, most pronounced within the lateral segment right middle lobe as seen on previous CT. Prior healed bilateral rib fractures. Soft tissue calcifications right breast. IMPRESSION: 1. Chronic scarring, no acute airspace disease. Electronically Signed   By: Randa Ngo M.D.   On: 03/04/2020 23:45   ECHOCARDIOGRAM COMPLETE  Result Date: 03/05/2020    ECHOCARDIOGRAM REPORT   Patient Name:   Sheryl Michael Date of Exam: 03/05/2020 Medical Rec #:  [id number]     Height:       60.0 in Accession #:    [id number]    Weight:       129.0 lb Date of Birth:  August 04, 1931     BSA:          1.549 m Patient Age:    78 years      BP:           102/25 mmHg Patient Gender: F             HR:           80 bpm. Exam Location:  [facility] Procedure: 2D Echo, Cardiac Doppler and Color Doppler Indications:     Atrial Fibrillation 427.31  History:         Patient has prior history of Echocardiogram examinations, most                  recent 05/31/2017. TIA; Arrythmias:Atrial Fibrillation.  Sonographer:     Sherrie Sport RDCS (AE) Referring Phys:  [id number] Bradly Bienenstock Diagnosing Phys: Isaias Cowman MD IMPRESSIONS  1. Left ventricular ejection fraction, by estimation, is 60 to 65%. The left ventricle has normal function. The left ventricle has no regional wall motion abnormalities. There is mild left ventricular hypertrophy. Left ventricular diastolic parameters were normal.  2. Right ventricular systolic function is normal. The right ventricular size is normal. There is mildly elevated pulmonary artery systolic pressure.  3. The mitral valve is normal in structure. Trivial mitral valve regurgitation. No evidence of mitral stenosis.  4. The aortic valve is normal in structure. Aortic valve regurgitation is mild. No aortic stenosis is present.  5. The inferior vena cava is normal in size with greater than 50% respiratory variability, suggesting right atrial pressure of 3 mmHg. FINDINGS  Left Ventricle: Left  ventricular ejection fraction, by estimation, is 60 to 65%. The left ventricle has normal function. The left ventricle has no regional wall motion abnormalities. The left ventricular internal cavity size was normal in size. There is  mild left ventricular hypertrophy. Left ventricular diastolic parameters were normal. Right Ventricle: The right ventricular size is normal. No increase  in right ventricular wall thickness. Right ventricular systolic function is normal. There is mildly elevated pulmonary artery systolic pressure. The tricuspid regurgitant velocity is 2.58  m/s, and with an assumed right atrial pressure of 10 mmHg, the estimated right ventricular systolic pressure is 123XX123 mmHg. Left Atrium: Left atrial size was normal in size. Right Atrium: Right atrial size was normal in size. Pericardium: There is no evidence of pericardial effusion. Mitral Valve: The mitral valve is normal in structure. Normal mobility of the mitral valve leaflets. Trivial mitral valve regurgitation. No evidence of mitral valve stenosis. Tricuspid Valve: The tricuspid valve is normal in structure. Tricuspid valve regurgitation is trivial. No evidence of tricuspid stenosis. Aortic Valve: The aortic valve is normal in structure. Aortic valve regurgitation is mild. Aortic regurgitation PHT measures 450 msec. No aortic stenosis is present. Aortic valve mean gradient measures 3.5 mmHg. Aortic valve peak gradient measures 6.8 mmHg. Aortic valve area, by VTI measures 3.26 cm. Pulmonic Valve: The pulmonic valve was normal in structure. Pulmonic valve regurgitation is not visualized. No evidence of pulmonic stenosis. Aorta: The aortic root is normal in size and structure. Venous: The inferior vena cava is normal in size with greater than 50% respiratory variability, suggesting right atrial pressure of 3 mmHg. IAS/Shunts: No atrial level shunt detected by color flow Doppler.  LEFT VENTRICLE PLAX 2D LVIDd:         4.03 cm  Diastology LVIDs:          2.46 cm  LV e' lateral:   5.87 cm/s LV PW:         1.25 cm  LV E/e' lateral: 20.6 LV IVS:        0.81 cm  LV e' medial:    5.98 cm/s LVOT diam:     2.10 cm  LV E/e' medial:  20.2 LV SV:         86 LV SV Index:   56 LVOT Area:     3.46 cm  RIGHT VENTRICLE RV Basal diam:  3.33 cm RV S prime:     14.10 cm/s TAPSE (M-mode): 2.7 cm LEFT ATRIUM           Index       RIGHT ATRIUM           Index LA diam:      3.40 cm 2.19 cm/m  RA Area:     14.30 cm LA Vol (A2C): 43.2 ml 27.88 ml/m RA Volume:   43.30 ml  27.95 ml/m LA Vol (A4C): 17.5 ml 11.30 ml/m  AORTIC VALVE                   PULMONIC VALVE AV Area (Vmax):    3.08 cm    PV Vmax:        0.84 m/s AV Area (Vmean):   3.05 cm    PV Peak grad:   2.8 mmHg AV Area (VTI):     3.26 cm    RVOT Peak grad: 3 mmHg AV Vmax:           130.50 cm/s AV Vmean:          85.500 cm/s AV VTI:            0.264 m AV Peak Grad:      6.8 mmHg AV Mean Grad:      3.5 mmHg LVOT Vmax:         116.00 cm/s LVOT Vmean:        75.200  cm/s LVOT VTI:          0.249 m LVOT/AV VTI ratio: 0.94 AI PHT:            450 msec  AORTA Ao Root diam: 3.20 cm MITRAL VALVE                TRICUSPID VALVE MV Area (PHT): 2.18 cm     TR Peak grad:   26.6 mmHg MV Decel Time: 348 msec     TR Vmax:        258.00 cm/s MV E velocity: 121.00 cm/s MV A velocity: 88.80 cm/s   SHUNTS MV E/A ratio:  1.36         Systemic VTI:  0.25 m                             Systemic Diam: 2.10 cm Isaias Cowman MD Electronically signed by Isaias Cowman MD Signature Date/Time: 03/05/2020/1:28:41 PM    Final    CT Renal Stone Study  Result Date: 03/05/2020 CLINICAL DATA:  Fever and chills EXAM: CT ABDOMEN AND PELVIS WITHOUT CONTRAST TECHNIQUE: Multidetector CT imaging of the abdomen and pelvis was performed following the standard protocol without IV contrast. COMPARISON:  September 14, 2004 FINDINGS: Lower chest: There is a calcified granuloma at the left lung base, otherwise the lungs are clear.The heart size is normal.  Hepatobiliary: The liver is normal. Normal gallbladder.There is no biliary ductal dilation. Pancreas: Normal contours without ductal dilatation. No peripancreatic fluid collection. Spleen: Unremarkable. Adrenals/Urinary Tract: --Adrenal glands: Unremarkable. --Right kidney/ureter: No hydronephrosis or radiopaque kidney stones. --Left kidney/ureter: No hydronephrosis or radiopaque kidney stones. --Urinary bladder: There are few tiny pockets gas in the urinary bladder. Stomach/Bowel: --Stomach/Duodenum: No hiatal hernia or other gastric abnormality. Normal duodenal course and caliber. --Small bowel: Unremarkable. --Colon: Rectosigmoid diverticulosis without acute inflammation. --Appendix: Normal. Vascular/Lymphatic: Atherosclerotic calcification is present within the non-aneurysmal abdominal aorta, without hemodynamically significant stenosis. --No retroperitoneal lymphadenopathy. --No mesenteric lymphadenopathy. --No pelvic or inguinal lymphadenopathy. Reproductive: Status post hysterectomy. No adnexal mass. There appears to be some degree of pelvic floor laxity. Other: No ascites or free air. The abdominal wall is normal. Musculoskeletal. No acute displaced fractures. IMPRESSION: 1. No acute abdominopelvic abnormality. 2. There are few tiny pockets gas in the urinary bladder. Correlation with urinalysis is recommended. This may be related to prior instrumentation. 3. Rectosigmoid diverticulosis without acute inflammation. Aortic Atherosclerosis (ICD10-I70.0). Electronically Signed   By: Constance Holster M.D.   On: 03/05/2020 00:27       Subjective: Patient seen with son at bedside this AM.  She denies fever/chills, chest pain, SOB, palpitations, dysuria, or other complaints.  Says she feels well on her feet but a little more weak than normal.     Discharge Exam: Vitals:   03/07/20 1035 03/07/20 1339  BP: 110/73 133/88  Pulse: (!) 101 66  Resp: 15 15  Temp: 98.2 F (36.8 C) 98.1 F (36.7 C)  SpO2:  97% 97%   Vitals:   03/07/20 0538 03/07/20 0539 03/07/20 1035 03/07/20 1339  BP: 134/81  110/73 133/88  Pulse: (!) 108 72 (!) 101 66  Resp: 20  15 15   Temp: 98.7 F (37.1 C)  98.2 F (36.8 C) 98.1 F (36.7 C)  TempSrc: Oral  Oral Oral  SpO2: 94% 94% 97% 97%  Weight:      Height:        General: Pt is alert, awake,  not in acute distress Cardiovascular: RRR, S1/S2 +, no rubs, no gallops Respiratory: CTA bilaterally, no wheezing, no rhonchi Abdominal: Soft, NT, ND, bowel sounds + Extremities: no edema, no cyanosis    The results of significant diagnostics from this hospitalization (including imaging, microbiology, ancillary and laboratory) are listed below for reference.     Microbiology: Recent Results (from the past 240 hour(s))  Blood Culture (routine x 2)     Status: None (Preliminary result)   Collection Time: 03/04/20 11:01 PM   Specimen: BLOOD LEFT WRIST  Result Value Ref Range Status   Specimen Description BLOOD LEFT WRIST  Final   Special Requests   Final    BOTTLES DRAWN AEROBIC AND ANAEROBIC Blood Culture adequate volume   Culture   Final    NO GROWTH 3 DAYS Performed at [facility], [address], [place], [place] [address]    Report Status PENDING  Incomplete  Urine culture     Status: None   Collection Time: 03/04/20 11:01 PM   Specimen: Urine, Clean Catch  Result Value Ref Range Status   Specimen Description   Final    URINE, CLEAN CATCH Performed at [facility], [address], [place], [place] [address]    Special Requests   Final    NONE Performed at [facility], [address] [place], [place] [address]    Culture   Final    NO GROWTH Performed at [facility], [place] [address], [place], [place] [address]    Report Status 03/06/2020 FINAL  Final  Blood Culture (routine x 2)     Status: None (Preliminary result)   Collection Time: 03/04/20 11:06 PM   Specimen: Left Antecubital; Blood  Result Value Ref  Range Status   Specimen Description LEFT ANTECUBITAL  Final   Special Requests   Final    BOTTLES DRAWN AEROBIC AND ANAEROBIC Blood Culture results may not be optimal due to an inadequate volume of blood received in culture bottles   Culture   Final    NO GROWTH 3 DAYS Performed at [facility], [address] [place],  [address]    Report Status PENDING  Incomplete  SARS Coronavirus 2 by RT PCR (hospital order, performed in [place] hospital lab) Nasopharyngeal Nasopharyngeal Swab     Status: None   Collection Time: 03/05/20  1:10 AM   Specimen: Nasopharyngeal Swab  Result Value Ref Range Status   SARS Coronavirus 2 NEGATIVE NEGATIVE Final    Comment: (NOTE) SARS-CoV-2 target nucleic acids are NOT DETECTED. The SARS-CoV-2 RNA is generally detectable in upper and lower respiratory specimens during the acute phase of infection. The lowest concentration of SARS-CoV-2 viral copies this assay can detect is 250 copies / mL. A negative result does not preclude SARS-CoV-2 infection and should not be used as the sole basis for treatment or other patient management decisions.  A negative result may occur with improper specimen collection / handling, submission of specimen other than nasopharyngeal swab, presence of viral mutation(s) within the areas targeted by this assay, and inadequate number of viral copies (<250 copies / mL). A negative result must be combined with clinical observations, patient history, and epidemiological information. Fact Sheet for Patients:   StrictlyIdeas.no Fact Sheet for Healthcare Providers: BankingDealers.co.za This test is not yet approved or cleared  by the [place] FDA and has been authorized for detection and/or diagnosis of SARS-CoV-2 by FDA under an Emergency Use Authorization (EUA).  This  EUA will remain in effect (meaning this test can be used) for the duration of the COVID-19  declaration under Section 564(b)(1) of the Act, 21 U.S.C. section 360bbb-3(b)(1), unless the authorization is terminated or revoked sooner. Performed at [facility], [address] [place], [place] [address]   MRSA PCR Screening     Status: None   Collection Time: 03/05/20 10:08 AM   Specimen: Nasal Mucosa; Nasopharyngeal  Result Value Ref Range Status   MRSA by PCR NEGATIVE NEGATIVE Final    Comment:        The GeneXpert MRSA Assay (FDA approved for NASAL specimens only), is one component of a comprehensive MRSA colonization surveillance program. It is not intended to diagnose MRSA infection nor to guide or monitor treatment for MRSA infections. Performed at [facility], [address] [place], [place] [address]      Labs: BNP (last 3 results) No results for input(s): BNP in the last 8760 hours. Basic Metabolic Panel: Recent Labs  Lab 03/04/20 2253 03/05/20 0418 03/06/20 0504 03/07/20 0648  NA 135 141 141 141  K 4.1 4.2 3.7 3.5  CL 103 109 109 107  CO2 23 24 26 25   GLUCOSE 120* 117* 92 103*  BUN 16 12 12 14   CREATININE 1.09* 1.02* 0.89 0.76  CALCIUM 8.9 8.4* 8.0* 8.2*  MG  --   --  2.0  --   PHOS  --   --  3.3  --    Liver Function Tests: Recent Labs  Lab 03/04/20 2253  AST 17  ALT 12  ALKPHOS 68  BILITOT 0.8  PROT 7.1  ALBUMIN 3.5   No results for input(s): LIPASE, AMYLASE in the last 168 hours. No results for input(s): AMMONIA in the last 168 hours. CBC: Recent Labs  Lab 03/04/20 2253 03/05/20 0418 03/06/20 0504 03/07/20 0648  WBC 18.3* 16.7* 7.4 7.4  NEUTROABS 16.4*  --   --   --   HGB 13.0 11.3* 10.1* 12.0  HCT 39.0 34.7* 30.5* 36.3  MCV 94.2 98.9 95.9 95.5  PLT 226 206 165 199   Cardiac Enzymes: No results for input(s): CKTOTAL, CKMB, CKMBINDEX, TROPONINI in the last 168 hours. BNP: Invalid input(s): POCBNP CBG: Recent Labs  Lab 03/05/20 0951  GLUCAP 82   D-Dimer No results for input(s): DDIMER in the  last 72 hours. Hgb A1c No results for input(s): HGBA1C in the last 72 hours. Lipid Profile No results for input(s): CHOL, HDL, LDLCALC, TRIG, CHOLHDL, LDLDIRECT in the last 72 hours. Thyroid function studies Recent Labs    03/05/20 0418  TSH 2.493   Anemia work up No results for input(s): VITAMINB12, FOLATE, FERRITIN, TIBC, IRON, RETICCTPCT in the last 72 hours. Urinalysis    Component Value Date/Time   COLORURINE YELLOW (A) 03/04/2020 2253   APPEARANCEUR CLEAR (A) 03/04/2020 2253   LABSPEC 1.006 03/04/2020 2253   PHURINE 7.0 03/04/2020 2253   GLUCOSEU NEGATIVE 03/04/2020 2253   HGBUR NEGATIVE 03/04/2020 2253   BILIRUBINUR NEGATIVE 03/04/2020 2253   KETONESUR NEGATIVE 03/04/2020 2253   PROTEINUR NEGATIVE 03/04/2020 2253   NITRITE NEGATIVE 03/04/2020 2253   LEUKOCYTESUR NEGATIVE 03/04/2020 2253   Sepsis Labs Invalid input(s): PROCALCITONIN,  WBC,  LACTICIDVEN Microbiology Recent Results (from the past 240 hour(s))  Blood Culture (routine x 2)     Status: None (Preliminary result)   Collection Time: 03/04/20 11:01 PM   Specimen: BLOOD LEFT WRIST  Result Value Ref Range Status   Specimen Description BLOOD  LEFT WRIST  Final   Special Requests   Final    BOTTLES DRAWN AEROBIC AND ANAEROBIC Blood Culture adequate volume   Culture   Final    NO GROWTH 3 DAYS Performed at [facility], [address] [place], [place] [address]    Report Status PENDING  Incomplete  Urine culture     Status: None   Collection Time: 03/04/20 11:01 PM   Specimen: Urine, Clean Catch  Result Value Ref Range Status   Specimen Description   Final    URINE, CLEAN CATCH Performed at [facility], [address] [place], [place] [address]    Special Requests   Final    NONE Performed at [facility], [address], [place], [place] [address]    Culture   Final    NO GROWTH Performed at [facility], [place] [address] [place], [place] [address]    Report  Status 03/06/2020 FINAL  Final  Blood Culture (routine x 2)     Status: None (Preliminary result)   Collection Time: 03/04/20 11:06 PM   Specimen: Left Antecubital; Blood  Result Value Ref Range Status   Specimen Description LEFT ANTECUBITAL  Final   Special Requests   Final    BOTTLES DRAWN AEROBIC AND ANAEROBIC Blood Culture results may not be optimal due to an inadequate volume of blood received in culture bottles   Culture   Final    NO GROWTH 3 DAYS Performed at [facility], [address], [place], [place] [address]    Report Status PENDING  Incomplete  SARS Coronavirus 2 by RT PCR (hospital order, performed in [place] hospital lab) Nasopharyngeal Nasopharyngeal Swab     Status: None   Collection Time: 03/05/20  1:10 AM   Specimen: Nasopharyngeal Swab  Result Value Ref Range Status   SARS Coronavirus 2 NEGATIVE NEGATIVE Final    Comment: (NOTE) SARS-CoV-2 target nucleic acids are NOT DETECTED. The SARS-CoV-2 RNA is generally detectable in upper and lower respiratory specimens during the acute phase of infection. The lowest concentration of SARS-CoV-2 viral copies this assay can detect is 250 copies / mL. A negative result does not preclude SARS-CoV-2 infection and should not be used as the sole basis for treatment or other patient management decisions.  A negative result may occur with improper specimen collection / handling, submission of specimen other than nasopharyngeal swab, presence of viral mutation(s) within the areas targeted by this assay, and inadequate number of viral copies (<250 copies / mL). A negative result must be combined with clinical observations, patient history, and epidemiological information. Fact Sheet for Patients:   StrictlyIdeas.no Fact Sheet for Healthcare Providers: BankingDealers.co.za This test is not yet approved or cleared  by the [place] FDA and has been authorized for  detection and/or diagnosis of SARS-CoV-2 by FDA under an Emergency Use Authorization (EUA).  This EUA will remain in effect (meaning this test can be used) for the duration of the COVID-19 declaration under Section 564(b)(1) of the Act, 21 U.S.C. section 360bbb-3(b)(1), unless the authorization is terminated or revoked sooner. Performed at [facility], [address] [place], [place] [address]   MRSA PCR Screening     Status: None   Collection Time: 03/05/20 10:08 AM   Specimen: Nasal Mucosa; Nasopharyngeal  Result Value Ref Range Status   MRSA by PCR NEGATIVE NEGATIVE Final    Comment:        The GeneXpert MRSA Assay (FDA  approved for NASAL specimens only), is one component of a comprehensive MRSA colonization surveillance program. It is not intended to diagnose MRSA infection nor to guide or monitor treatment for MRSA infections. Performed at [facility], [address] [place], [place] [address]      Time coordinating discharge: Over 30 minutes  SIGNED:   Ezekiel Slocumb, DO [facility] 03/07/2020, 3:12 PM   If 7PM-7AM, please contact night-coverage [email or web address]

## 2020-03-07 NOTE — Progress Notes (Signed)
   03/07/20 1035  Assess: MEWS Score  Temp 98.2 F (36.8 C)  BP 110/73  Pulse Rate (!) 101  Resp 15  SpO2 97 %  O2 Device Room Air  Assess: MEWS Score  MEWS Temp 0  MEWS Systolic 0  MEWS Pulse 1  MEWS RR 0  MEWS LOC 0  MEWS Score 1  MEWS Score Color Green  Assess: if the MEWS score is Yellow or Red  Were vital signs taken at a resting state? Yes  Document  Patient Outcome Stabilized after interventions  Progress note created (see row info) Yes  Patient stabilized.  Though patient remains in afib, MEWS score is in the green.  Will continue to check vitals according to unit routine.  Dola Argyle, RN

## 2020-03-09 LAB — CULTURE, BLOOD (ROUTINE X 2)
Culture: NO GROWTH
Culture: NO GROWTH
Special Requests: ADEQUATE

## 2020-03-17 ENCOUNTER — Other Ambulatory Visit (HOSPITAL_COMMUNITY): Payer: Self-pay | Admitting: Neurology

## 2020-03-17 ENCOUNTER — Other Ambulatory Visit: Payer: Self-pay | Admitting: Neurology

## 2020-03-17 DIAGNOSIS — R4189 Other symptoms and signs involving cognitive functions and awareness: Secondary | ICD-10-CM

## 2020-04-04 ENCOUNTER — Other Ambulatory Visit: Payer: Self-pay

## 2020-04-04 ENCOUNTER — Ambulatory Visit (HOSPITAL_COMMUNITY)
Admission: RE | Admit: 2020-04-04 | Discharge: 2020-04-04 | Disposition: A | Discharge status: Home / Self Care | Payer: Medicare Other | Source: Ambulatory Visit | Attending: Neurology | Admitting: Neurology

## 2020-04-04 DIAGNOSIS — R4189 Other symptoms and signs involving cognitive functions and awareness: Secondary | ICD-10-CM | POA: Insufficient documentation

## 2020-05-04 ENCOUNTER — Inpatient Hospital Stay
Admission: EM | Admit: 2020-05-04 | Discharge: 2020-05-07 | DRG: 872 | Disposition: A | Discharge status: Home Health | Payer: Medicare Other | Attending: Internal Medicine | Admitting: Internal Medicine

## 2020-05-04 ENCOUNTER — Other Ambulatory Visit: Payer: Self-pay

## 2020-05-04 ENCOUNTER — Emergency Department: Payer: Medicare Other

## 2020-05-04 ENCOUNTER — Ambulatory Visit: Payer: Medicare Other | Admitting: Urology

## 2020-05-04 DIAGNOSIS — Z8701 Personal history of pneumonia (recurrent): Secondary | ICD-10-CM

## 2020-05-04 DIAGNOSIS — Z86010 Personal history of colonic polyps: Secondary | ICD-10-CM

## 2020-05-04 DIAGNOSIS — Z923 Personal history of irradiation: Secondary | ICD-10-CM

## 2020-05-04 DIAGNOSIS — N390 Urinary tract infection, site not specified: Secondary | ICD-10-CM | POA: Diagnosis present

## 2020-05-04 DIAGNOSIS — I739 Peripheral vascular disease, unspecified: Secondary | ICD-10-CM | POA: Diagnosis present

## 2020-05-04 DIAGNOSIS — A419 Sepsis, unspecified organism: Secondary | ICD-10-CM | POA: Diagnosis not present

## 2020-05-04 DIAGNOSIS — Z79890 Hormone replacement therapy (postmenopausal): Secondary | ICD-10-CM

## 2020-05-04 DIAGNOSIS — N1831 Chronic kidney disease, stage 3a: Secondary | ICD-10-CM | POA: Diagnosis present

## 2020-05-04 DIAGNOSIS — Z880 Allergy status to penicillin: Secondary | ICD-10-CM

## 2020-05-04 DIAGNOSIS — E785 Hyperlipidemia, unspecified: Secondary | ICD-10-CM | POA: Diagnosis present

## 2020-05-04 DIAGNOSIS — I480 Paroxysmal atrial fibrillation: Secondary | ICD-10-CM | POA: Diagnosis present

## 2020-05-04 DIAGNOSIS — R030 Elevated blood-pressure reading, without diagnosis of hypertension: Secondary | ICD-10-CM | POA: Diagnosis present

## 2020-05-04 DIAGNOSIS — K219 Gastro-esophageal reflux disease without esophagitis: Secondary | ICD-10-CM | POA: Diagnosis present

## 2020-05-04 DIAGNOSIS — F329 Major depressive disorder, single episode, unspecified: Secondary | ICD-10-CM | POA: Diagnosis present

## 2020-05-04 DIAGNOSIS — M1990 Unspecified osteoarthritis, unspecified site: Secondary | ICD-10-CM | POA: Diagnosis present

## 2020-05-04 DIAGNOSIS — Z9842 Cataract extraction status, left eye: Secondary | ICD-10-CM

## 2020-05-04 DIAGNOSIS — E039 Hypothyroidism, unspecified: Secondary | ICD-10-CM | POA: Diagnosis present

## 2020-05-04 DIAGNOSIS — N179 Acute kidney failure, unspecified: Secondary | ICD-10-CM | POA: Diagnosis present

## 2020-05-04 DIAGNOSIS — D631 Anemia in chronic kidney disease: Secondary | ICD-10-CM | POA: Diagnosis present

## 2020-05-04 DIAGNOSIS — Z7901 Long term (current) use of anticoagulants: Secondary | ICD-10-CM

## 2020-05-04 DIAGNOSIS — Z853 Personal history of malignant neoplasm of breast: Secondary | ICD-10-CM

## 2020-05-04 DIAGNOSIS — Z961 Presence of intraocular lens: Secondary | ICD-10-CM | POA: Diagnosis present

## 2020-05-04 DIAGNOSIS — N10 Acute tubulo-interstitial nephritis: Secondary | ICD-10-CM | POA: Diagnosis present

## 2020-05-04 DIAGNOSIS — Z885 Allergy status to narcotic agent status: Secondary | ICD-10-CM

## 2020-05-04 DIAGNOSIS — Z91041 Radiographic dye allergy status: Secondary | ICD-10-CM

## 2020-05-04 DIAGNOSIS — Z90710 Acquired absence of both cervix and uterus: Secondary | ICD-10-CM

## 2020-05-04 DIAGNOSIS — Z888 Allergy status to other drugs, medicaments and biological substances status: Secondary | ICD-10-CM

## 2020-05-04 DIAGNOSIS — N3000 Acute cystitis without hematuria: Secondary | ICD-10-CM

## 2020-05-04 DIAGNOSIS — Z79899 Other long term (current) drug therapy: Secondary | ICD-10-CM

## 2020-05-04 DIAGNOSIS — R0602 Shortness of breath: Secondary | ICD-10-CM | POA: Diagnosis not present

## 2020-05-04 DIAGNOSIS — Z881 Allergy status to other antibiotic agents status: Secondary | ICD-10-CM

## 2020-05-04 DIAGNOSIS — Z87440 Personal history of urinary (tract) infections: Secondary | ICD-10-CM

## 2020-05-04 DIAGNOSIS — Z803 Family history of malignant neoplasm of breast: Secondary | ICD-10-CM

## 2020-05-04 DIAGNOSIS — M810 Age-related osteoporosis without current pathological fracture: Secondary | ICD-10-CM | POA: Diagnosis present

## 2020-05-04 DIAGNOSIS — B952 Enterococcus as the cause of diseases classified elsewhere: Secondary | ICD-10-CM | POA: Diagnosis present

## 2020-05-04 DIAGNOSIS — Z882 Allergy status to sulfonamides status: Secondary | ICD-10-CM

## 2020-05-04 DIAGNOSIS — Z9841 Cataract extraction status, right eye: Secondary | ICD-10-CM

## 2020-05-04 DIAGNOSIS — Z8673 Personal history of transient ischemic attack (TIA), and cerebral infarction without residual deficits: Secondary | ICD-10-CM

## 2020-05-04 LAB — URINALYSIS, COMPLETE (UACMP) WITH MICROSCOPIC
Bacteria, UA: NONE SEEN
Bilirubin Urine: NEGATIVE
Glucose, UA: NEGATIVE mg/dL
Hgb urine dipstick: NEGATIVE
Ketones, ur: NEGATIVE mg/dL
Nitrite: NEGATIVE
Protein, ur: NEGATIVE mg/dL
Specific Gravity, Urine: 1.012 (ref 1.005–1.030)
WBC, UA: >50 WBC/hpf — ABNORMAL HIGH (ref 0–5)
pH: 5 (ref 5.0–8.0)

## 2020-05-04 LAB — BASIC METABOLIC PANEL
Anion gap: 11 (ref 5–15)
BUN: 16 mg/dL (ref 8–23)
CO2: 25 mmol/L (ref 22–32)
Calcium: 8.9 mg/dL (ref 8.9–10.3)
Chloride: 103 mmol/L (ref 98–111)
Creatinine, Ser: 1.23 mg/dL — ABNORMAL HIGH (ref 0.44–1.00)
GFR calc Af Amer: 46 mL/min — ABNORMAL LOW (ref >60)
GFR calc non Af Amer: 39 mL/min — ABNORMAL LOW (ref >60)
Glucose, Bld: 113 mg/dL — ABNORMAL HIGH (ref 70–99)
Potassium: 3.7 mmol/L (ref 3.5–5.1)
Sodium: 139 mmol/L (ref 135–145)

## 2020-05-04 LAB — CBC
HCT: 37.6 % (ref 36.0–46.0)
Hemoglobin: 12 g/dL (ref 12.0–15.0)
MCH: 30.5 pg (ref 26.0–34.0)
MCHC: 31.9 g/dL (ref 30.0–36.0)
MCV: 95.4 fL (ref 80.0–100.0)
Platelets: 328 10*3/uL (ref 150–400)
RBC: 3.94 MIL/uL (ref 3.87–5.11)
RDW: 13.9 % (ref 11.5–15.5)
WBC: 13.3 10*3/uL — ABNORMAL HIGH (ref 4.0–10.5)
nRBC: 0 % (ref 0.0–0.2)

## 2020-05-04 LAB — LACTIC ACID, PLASMA: Lactic Acid, Venous: 2.3 mmol/L (ref 0.5–1.9)

## 2020-05-04 LAB — TROPONIN I (HIGH SENSITIVITY): Troponin I (High Sensitivity): 5 ng/L (ref <18)

## 2020-05-04 MED ORDER — CEPHALEXIN 500 MG PO CAPS: 500.0000 mg | ORAL_CAPSULE | Freq: Once | ORAL | Status: DC

## 2020-05-04 MED ORDER — CEPHALEXIN 500 MG PO CAPS
500.0000 mg | ORAL_CAPSULE | Freq: Two times a day (BID) | ORAL | 0 refills | Status: DC
Start: 2020-05-04 — End: 2020-05-07

## 2020-05-04 NOTE — ED Triage Notes (Signed)
Pt here via EMS with c/o left flank pain. VSS. Pt does have current UTI and on antibiotics.

## 2020-05-04 NOTE — ED Triage Notes (Addendum)
Pt comes EMS c/o left flank pain and SOB. Pt currently being treated for a UTI. Pt tachypnic. States SOB for one day. Pt has had one dose of antibiotics and thinks that is making her sick. Vomited and symptoms started after medication.

## 2020-05-04 NOTE — ED Provider Notes (Addendum)
[facility] Emergency Department Provider Note ____________________________________________   First MD Initiated Contact with Patient 05/04/20 2132     (approximate)  I have reviewed the triage vital signs and the nursing notes.   HISTORY  Chief Complaint Flank Pain and Shortness of Breath    HPI Sheryl Michael is a 86 y.o. female with PMH as noted below who presents with nausea and vomiting and shortness of breath, acute onset earlier today after she took a dose of Macrobid for a UTI.  The patient states that she had some dysuria, frequency, and left-sided flank pain and was diagnosed with UTI.  She states that the shortness of breath and nausea have subsided and she is no longer vomiting.  Her son reports that she had a recent UTI with sepsis a few months ago.  Past Medical History:  Diagnosis Date  . Arthritis   . Atrial fibrillation ([place])   . Breast cancer ([place]) 2006   right breast ca with lumpectomy and rad tx  . Colon polyp   . Cystocele   . Depression   . Female bladder prolapse   . GERD (gastroesophageal reflux disease)   . Goiter   . Hyperlipemia   . Hypothyroidism   . Osteoporosis   . Personal history of radiation therapy 2006   right breast ca  . Pneumonia   . Procidentia of uterus   . Recurrent UTI   . Reflux   . TIA (transient ischemic attack)   . Vaginal atrophy     Patient Active Problem List   Diagnosis Date Noted  . History of recurrent UTIs 03/07/2020  . Sepsis secondary to UTI ([place]) 03/05/2020  . Strep throat 12/19/2018  . Allergic reaction 12/19/2018  . Sprain of interphalangeal joint of right ring finger 07/30/2018  . Traumatic complete tear of left rotator cuff 07/16/2018  . Strain of left hip 07/16/2018  . Encounter for Hemoccult screening 03/12/2018  . Hypothyroidism, acquired 07/10/2017  . Arrhythmia 05/31/2017  . Atrial fibrillation with RVR ([place]) 05/30/2017  . Dyspnea 05/30/2017  . Rotator cuff tendinitis,  right 12/05/2016  . Shingles 12/29/2015  . S/P VH (vaginal hysterectomy) 12/22/2015  . Cystocele 12/21/2015  . Colon polyp 10/07/2015  . Bladder cystocele 10/07/2015  . Lower esophageal ring 10/07/2015  . H/O neoplasm 08/17/2015  . History of nonmelanoma skin cancer 08/17/2015  . Malignant neoplasm of breast ([place]) 04/01/2014  . GERD (gastroesophageal reflux disease) 04/01/2014  . Big thyroid 04/01/2014  . HLD (hyperlipidemia) 04/01/2014  . OP (osteoporosis) 04/01/2014  . Temporary cerebral vascular dysfunction 04/01/2014  . H/O gastrointestinal disease 02/28/2014    Past Surgical History:  Procedure Laterality Date  . APPENDECTOMY    . BREAST BIOPSY Right 2006   breast cancer  . BREAST LUMPECTOMY Right 2006   positive  . BREAST SURGERY Right    lumpectomy  . CATARACT EXTRACTION W/ INTRAOCULAR LENS  IMPLANT, BILATERAL    . COLONOSCOPY    . CYSTOCELE REPAIR N/A 12/21/2015   Procedure: ANTERIOR REPAIR (CYSTOCELE);  Surgeon: Brayton Mars, MD;  Location: [facility] ORS;  Service: Gynecology;  Laterality: N/A;  . DILATION AND CURETTAGE OF UTERUS    . ELECTROMAGNETIC NAVIGATION BROCHOSCOPY Right 12/13/2018   Procedure: ELECTROMAGNETIC NAVIGATION BRONCHOSCOPY;  Surgeon: Flora Lipps, MD;  Location: [facility] ORS;  Service: Cardiopulmonary;  Laterality: Right;  . EYE SURGERY    . VAGINAL HYSTERECTOMY Bilateral 12/21/2015   Procedure: TVH BSO;  Surgeon: Brayton Mars, MD;  Location:  [facility] ORS;  Service: Gynecology;  Laterality: Bilateral;    Prior to Admission medications   Medication Sig Start Date End Date Taking? Authorizing Provider  acetaminophen (TYLENOL) 325 MG tablet Take 650 mg by mouth every 6 (six) hours as needed for moderate pain.    [provider]  apixaban (ELIQUIS) 2.5 MG TABS tablet Take 1 tablet (2.5 mg total) by mouth 2 (two) times daily. Patient taking differently: Take 2.5 mg by mouth every 12 (twelve) hours.  05/31/17   Demetrios Loll, MD  cephALEXin  (KEFLEX) 500 MG capsule Take 1 capsule (500 mg total) by mouth 2 (two) times daily for 7 days. 05/04/20 05/11/20  Arta Silence, MD  conjugated estrogens (PREMARIN) vaginal cream Place 0.5 g vaginally 2 (two) times a week. 09/05/19   [provider]  cyanocobalamin (,VITAMIN B-12,) 1000 MCG/ML injection Inject 1,000 mcg into the muscle every 28 (twenty-eight) days. 03/03/20   [provider]  diltiazem (TIAZAC) 180 MG 24 hr capsule Take 180 mg by mouth daily. 11/26/19   [provider]  diphenhydrAMINE (BENADRYL) 25 MG tablet Take 25 mg by mouth every 6 (six) hours as needed.    [provider]  escitalopram (LEXAPRO) 10 MG tablet Take 10 mg by mouth daily.     [provider]  feeding supplement, ENSURE ENLIVE, (ENSURE ENLIVE) LIQD Take 237 mLs by mouth 2 (two) times daily between meals. 03/08/20   Ezekiel Slocumb, DO  levothyroxine (SYNTHROID, LEVOTHROID) 50 MCG tablet Take 50 mcg by mouth daily before breakfast.    [provider]  omeprazole (PRILOSEC OTC) 20 MG tablet Take 20 mg by mouth daily.     [provider]  oxybutynin (DITROPAN) 5 MG tablet Take 5 mg by mouth 2 (two) times daily. 05/17/19 05/16/20  [provider]  sodium chloride (OCEAN) 0.65 % SOLN nasal spray Place 1 spray into both nostrils daily as needed for congestion.    [provider]    Allergies Bactrim [sulfamethoxazole-trimethoprim], Iodinated diagnostic agents, Codeine, Erythromycin ethylsuccinate, Phenobarbital, Ciprofloxacin, and Penicillin v potassium  Family History  Problem Relation Age of Onset  . Diabetes Sister   . Breast cancer Sister        late 4's  . Diabetes Brother   . Diabetes Sister     Social History Social History   Tobacco Use  . Smoking status: Never Smoker  . Smokeless tobacco: Never Used  Vaping Use  . Vaping Use: Never used  Substance Use Topics  . Alcohol use: No  . Drug use: No    Review of  Systems  Constitutional: No fever/chills. Eyes: No visual changes. ENT: No sore throat. Cardiovascular: Denies chest pain. Respiratory: Denies shortness of breath. Gastrointestinal: Positive for resolved nausea and vomiting. Genitourinary: Positive for dysuria.  Musculoskeletal: Negative for back pain. Skin: Negative for rash. Neurological: Negative for headache.   ____________________________________________   PHYSICAL EXAM:  VITAL SIGNS: ED Triage Vitals  Enc Vitals Group     BP 05/04/20 1813 (!) 98/28     Pulse Rate 05/04/20 1813 75     Resp 05/04/20 1813 18     Temp 05/04/20 1813 99.7 F (37.6 C)     Temp Source 05/04/20 1813 Oral     SpO2 05/04/20 1813 91 %     Weight 05/04/20 1814 129 lb (58.5 kg)     Height 05/04/20 1814 5\' 1"  (1.549 m)     Head Circumference --      Peak  Flow --      Pain Score 05/04/20 1814 5     Pain Loc --      Pain Edu? --      Excl. in [place]? --     Constitutional: Alert and oriented. Well appearing for age and in no acute distress. Eyes: Conjunctivae are normal.  Head: Atraumatic. Nose: No congestion/rhinnorhea. Mouth/Throat: Mucous membranes are moist.   Neck: Normal range of motion.  Cardiovascular: Normal rate, regular rhythm. Grossly normal heart sounds.  Good peripheral circulation. Respiratory: Normal respiratory effort.  No retractions. Lungs CTAB. Gastrointestinal: Soft and nontender. No distention.  Genitourinary: No flank tenderness. Musculoskeletal: Extremities warm and well perfused.  Neurologic:  Normal speech and language. No gross focal neurologic deficits are appreciated.  Skin:  Skin is warm and dry. No rash noted. Psychiatric: Mood and affect are normal. Speech and behavior are normal.  ____________________________________________   LABS (all labs ordered are listed, but only abnormal results are displayed)  Labs Reviewed  URINALYSIS, COMPLETE (UACMP) WITH MICROSCOPIC - Abnormal; Notable for the following  components:      Result Value   Color, Urine YELLOW (*)    APPearance HAZY (*)    Leukocytes,Ua SMALL (*)    WBC, UA >50 (*)    All other components within normal limits  BASIC METABOLIC PANEL - Abnormal; Notable for the following components:   Glucose, Bld 113 (*)    Creatinine, Ser 1.23 (*)    GFR calc non Af Amer 39 (*)    GFR calc Af Amer 46 (*)    All other components within normal limits  CBC - Abnormal; Notable for the following components:   WBC 13.3 (*)    All other components within normal limits  LACTIC ACID, PLASMA  LACTIC ACID, PLASMA  TROPONIN I (HIGH SENSITIVITY)   ____________________________________________  EKG  ED ECG REPORT I, Arta Silence, the attending physician, personally viewed and interpreted this ECG.  Date: 05/04/2020 EKG Time: 1819 Rate: 70 Rhythm: normal sinus rhythm QRS Axis: normal Intervals: normal ST/T Wave abnormalities: Nonspecific ST abnormalities Narrative Interpretation: Nonspecific abnormalities with no evidence of acute ischemia  ____________________________________________  RADIOLOGY  CXR: Chronic interstitial changes and scarring with no acute consolidation or edema  ____________________________________________   PROCEDURES  Procedure(s) performed: No  Procedures  Critical Care performed: No ____________________________________________   INITIAL IMPRESSION / ASSESSMENT AND PLAN / ED COURSE  Pertinent labs & imaging results that were available during my care of the patient were reviewed by me and considered in my medical decision making (see chart for details).  86 year old female with PMH as noted above presents with nausea and vomiting as well as some shortness of breath acute onset earlier today after taking a dose of Macrobid for UTI.  She is experiencing some urinary symptoms and left-sided flank pain related to the UTI.  I reviewed the past medical records in [place].  The patient was most recently  admitted in May with sepsis thought to be related to UTI, although at that time she presented with altered mental status and fever.  However, urine culture was negative.  On exam, the patient is well-appearing for her age.  Her vital signs are normal except for borderline elevated temperature.  The physical exam is otherwise unremarkable.  She is currently tolerating p.o.  Overall presentation is consistent with gastritis or other acute reaction to the medication which likely cause the vomiting and shortness of breath.  There is no evidence of anaphylaxis.  The  patient's urinary symptoms are consistent with UTI; she has some mild flank pain, but no other clinical evidence to suggest pyelonephritis.  There is no evidence of sepsis.  Initial basic labs are unremarkable except for slightly elevated WBC count, and significant WBCs on the UA.  I will add on a lactate as well as a troponin to evaluate for any signs of sepsis and to rule the patient out for ACS.  If these are negative and the patient continues to tolerate p.o. and appear well, I anticipate discharge home.  ----------------------------------------- 11:24 PM on 05/04/2020 -----------------------------------------  The patient is still pending lactate and troponin.  I signed her out to the oncoming physician Dr. Beather Arbour.  ____________________________________________   FINAL CLINICAL IMPRESSION(S) / ED DIAGNOSES  Final diagnoses:  Acute cystitis without hematuria      NEW MEDICATIONS STARTED DURING THIS VISIT:  New Prescriptions   CEPHALEXIN (KEFLEX) 500 MG CAPSULE    Take 1 capsule (500 mg total) by mouth 2 (two) times daily for 7 days.     Note:  This document was prepared using Dragon voice recognition software and may include unintentional dictation errors.    Arta Silence, MD 05/04/20 Maryln Manuel    Arta Silence, MD 05/04/20 2325

## 2020-05-04 NOTE — ED Notes (Signed)
Date and time results received: 05/04/20 11:54 PM    Test: Lactic  Critical Value: 2.3  Name of Provider Notified: EDP Beather Arbour

## 2020-05-05 DIAGNOSIS — A419 Sepsis, unspecified organism: Secondary | ICD-10-CM | POA: Diagnosis present

## 2020-05-05 DIAGNOSIS — F329 Major depressive disorder, single episode, unspecified: Secondary | ICD-10-CM | POA: Diagnosis present

## 2020-05-05 DIAGNOSIS — B952 Enterococcus as the cause of diseases classified elsewhere: Secondary | ICD-10-CM | POA: Diagnosis present

## 2020-05-05 DIAGNOSIS — K219 Gastro-esophageal reflux disease without esophagitis: Secondary | ICD-10-CM | POA: Diagnosis present

## 2020-05-05 DIAGNOSIS — Z853 Personal history of malignant neoplasm of breast: Secondary | ICD-10-CM | POA: Diagnosis not present

## 2020-05-05 DIAGNOSIS — Z9841 Cataract extraction status, right eye: Secondary | ICD-10-CM | POA: Diagnosis not present

## 2020-05-05 DIAGNOSIS — Z923 Personal history of irradiation: Secondary | ICD-10-CM | POA: Diagnosis not present

## 2020-05-05 DIAGNOSIS — N10 Acute tubulo-interstitial nephritis: Secondary | ICD-10-CM | POA: Diagnosis present

## 2020-05-05 DIAGNOSIS — R0602 Shortness of breath: Secondary | ICD-10-CM | POA: Diagnosis present

## 2020-05-05 DIAGNOSIS — A4150 Gram-negative sepsis, unspecified: Secondary | ICD-10-CM

## 2020-05-05 DIAGNOSIS — I480 Paroxysmal atrial fibrillation: Secondary | ICD-10-CM | POA: Diagnosis present

## 2020-05-05 DIAGNOSIS — E785 Hyperlipidemia, unspecified: Secondary | ICD-10-CM

## 2020-05-05 DIAGNOSIS — N390 Urinary tract infection, site not specified: Secondary | ICD-10-CM | POA: Diagnosis not present

## 2020-05-05 DIAGNOSIS — N189 Chronic kidney disease, unspecified: Secondary | ICD-10-CM | POA: Diagnosis not present

## 2020-05-05 DIAGNOSIS — Z961 Presence of intraocular lens: Secondary | ICD-10-CM | POA: Diagnosis present

## 2020-05-05 DIAGNOSIS — Z8673 Personal history of transient ischemic attack (TIA), and cerebral infarction without residual deficits: Secondary | ICD-10-CM | POA: Diagnosis not present

## 2020-05-05 DIAGNOSIS — Z90710 Acquired absence of both cervix and uterus: Secondary | ICD-10-CM | POA: Diagnosis not present

## 2020-05-05 DIAGNOSIS — Z9842 Cataract extraction status, left eye: Secondary | ICD-10-CM | POA: Diagnosis not present

## 2020-05-05 DIAGNOSIS — N1831 Chronic kidney disease, stage 3a: Secondary | ICD-10-CM | POA: Diagnosis present

## 2020-05-05 DIAGNOSIS — N179 Acute kidney failure, unspecified: Secondary | ICD-10-CM | POA: Diagnosis present

## 2020-05-05 DIAGNOSIS — E039 Hypothyroidism, unspecified: Secondary | ICD-10-CM | POA: Diagnosis present

## 2020-05-05 DIAGNOSIS — Z8701 Personal history of pneumonia (recurrent): Secondary | ICD-10-CM | POA: Diagnosis not present

## 2020-05-05 DIAGNOSIS — M1990 Unspecified osteoarthritis, unspecified site: Secondary | ICD-10-CM | POA: Diagnosis present

## 2020-05-05 DIAGNOSIS — Z803 Family history of malignant neoplasm of breast: Secondary | ICD-10-CM | POA: Diagnosis not present

## 2020-05-05 DIAGNOSIS — Z86010 Personal history of colonic polyps: Secondary | ICD-10-CM | POA: Diagnosis not present

## 2020-05-05 DIAGNOSIS — M810 Age-related osteoporosis without current pathological fracture: Secondary | ICD-10-CM | POA: Diagnosis present

## 2020-05-05 DIAGNOSIS — I739 Peripheral vascular disease, unspecified: Secondary | ICD-10-CM | POA: Diagnosis present

## 2020-05-05 DIAGNOSIS — Z87440 Personal history of urinary (tract) infections: Secondary | ICD-10-CM | POA: Diagnosis not present

## 2020-05-05 LAB — CBC
HCT: 30.4 % — ABNORMAL LOW (ref 36.0–46.0)
Hemoglobin: 10.2 g/dL — ABNORMAL LOW (ref 12.0–15.0)
MCH: 30.8 pg (ref 26.0–34.0)
MCHC: 33.6 g/dL (ref 30.0–36.0)
MCV: 91.8 fL (ref 80.0–100.0)
Platelets: 250 10*3/uL (ref 150–400)
RBC: 3.31 MIL/uL — ABNORMAL LOW (ref 3.87–5.11)
RDW: 14.2 % (ref 11.5–15.5)
WBC: 23.5 10*3/uL — ABNORMAL HIGH (ref 4.0–10.5)
nRBC: 0 % (ref 0.0–0.2)

## 2020-05-05 LAB — BASIC METABOLIC PANEL
Anion gap: 7 (ref 5–15)
BUN: 18 mg/dL (ref 8–23)
CO2: 25 mmol/L (ref 22–32)
Calcium: 8.4 mg/dL — ABNORMAL LOW (ref 8.9–10.3)
Chloride: 108 mmol/L (ref 98–111)
Creatinine, Ser: 1.06 mg/dL — ABNORMAL HIGH (ref 0.44–1.00)
GFR calc Af Amer: 55 mL/min — ABNORMAL LOW (ref >60)
GFR calc non Af Amer: 47 mL/min — ABNORMAL LOW (ref >60)
Glucose, Bld: 133 mg/dL — ABNORMAL HIGH (ref 70–99)
Potassium: 4 mmol/L (ref 3.5–5.1)
Sodium: 140 mmol/L (ref 135–145)

## 2020-05-05 LAB — LACTIC ACID, PLASMA: Lactic Acid, Venous: 2.2 mmol/L (ref 0.5–1.9)

## 2020-05-05 MED ORDER — VANCOMYCIN HCL 500 MG/100ML IV SOLN
500.0000 mg | INTRAVENOUS | Status: DC
  Administered 2020-05-06: 500 mg via INTRAVENOUS
  Filled 2020-05-05 (×2): qty 100

## 2020-05-05 MED ORDER — OMEPRAZOLE MAGNESIUM 20 MG PO TBEC: 20.0000 mg | DELAYED_RELEASE_TABLET | Freq: Every day | ORAL | Status: DC

## 2020-05-05 MED ORDER — AZTREONAM 1 G IJ SOLR
1.0000 g | Freq: Three times a day (TID) | INTRAMUSCULAR | Status: DC
  Filled 2020-05-05 (×2): qty 1

## 2020-05-05 MED ORDER — LEVOTHYROXINE SODIUM 50 MCG PO TABS
50.0000 ug | ORAL_TABLET | Freq: Every day | ORAL | Status: DC
  Administered 2020-05-05 – 2020-05-07 (×3): 50 ug via ORAL
  Filled 2020-05-05 (×3): qty 1

## 2020-05-05 MED ORDER — ONDANSETRON HCL 4 MG PO TABS: 4.0000 mg | ORAL_TABLET | Freq: Four times a day (QID) | ORAL | Status: DC | PRN

## 2020-05-05 MED ORDER — CYANOCOBALAMIN 1000 MCG/ML IJ SOLN: 1000.0000 ug | INTRAMUSCULAR | Status: DC

## 2020-05-05 MED ORDER — LACTATED RINGERS IV BOLUS (SEPSIS)
1000.0000 mL | Freq: Once | INTRAVENOUS | Status: AC
  Administered 2020-05-05: 1000 mL via INTRAVENOUS

## 2020-05-05 MED ORDER — MAGNESIUM HYDROXIDE 400 MG/5ML PO SUSP: 30.0000 mL | Freq: Every day | ORAL | Status: DC | PRN

## 2020-05-05 MED ORDER — PANTOPRAZOLE SODIUM 40 MG PO TBEC
40.0000 mg | DELAYED_RELEASE_TABLET | Freq: Every day | ORAL | Status: DC
  Administered 2020-05-05 – 2020-05-07 (×3): 40 mg via ORAL
  Filled 2020-05-05 (×3): qty 1

## 2020-05-05 MED ORDER — ESCITALOPRAM OXALATE 10 MG PO TABS
10.0000 mg | ORAL_TABLET | Freq: Every day | ORAL | Status: DC
  Administered 2020-05-05 – 2020-05-07 (×3): 10 mg via ORAL
  Filled 2020-05-05 (×3): qty 1

## 2020-05-05 MED ORDER — SODIUM CHLORIDE 0.9 % IV SOLN: INTRAVENOUS | Status: DC

## 2020-05-05 MED ORDER — TRAZODONE HCL 50 MG PO TABS: 25.0000 mg | ORAL_TABLET | Freq: Every evening | ORAL | Status: DC | PRN

## 2020-05-05 MED ORDER — ONDANSETRON HCL 4 MG/2ML IJ SOLN: 4.0000 mg | Freq: Four times a day (QID) | INTRAMUSCULAR | Status: DC | PRN

## 2020-05-05 MED ORDER — SODIUM CHLORIDE 0.9 % IV SOLN
1.0000 g | Freq: Three times a day (TID) | INTRAVENOUS | Status: DC
  Administered 2020-05-05 – 2020-05-06 (×3): 1 g via INTRAVENOUS
  Filled 2020-05-05 (×5): qty 1

## 2020-05-05 MED ORDER — VANCOMYCIN HCL 1250 MG/250ML IV SOLN
1250.0000 mg | Freq: Once | INTRAVENOUS | Status: AC
  Administered 2020-05-05: 1250 mg via INTRAVENOUS
  Filled 2020-05-05: qty 250

## 2020-05-05 MED ORDER — APIXABAN 2.5 MG PO TABS
2.5000 mg | ORAL_TABLET | Freq: Two times a day (BID) | ORAL | Status: DC
  Administered 2020-05-05 – 2020-05-07 (×5): 2.5 mg via ORAL
  Filled 2020-05-05 (×5): qty 1

## 2020-05-05 MED ORDER — ACETAMINOPHEN 650 MG RE SUPP: 650.0000 mg | Freq: Four times a day (QID) | RECTAL | Status: DC | PRN

## 2020-05-05 MED ORDER — ENSURE ENLIVE PO LIQD
237.0000 mL | Freq: Two times a day (BID) | ORAL | Status: DC
  Administered 2020-05-05 – 2020-05-07 (×5): 237 mL via ORAL

## 2020-05-05 MED ORDER — SODIUM CHLORIDE 0.9 % IV SOLN
2.0000 g | Freq: Once | INTRAVENOUS | Status: AC
  Administered 2020-05-05: 2 g via INTRAVENOUS
  Filled 2020-05-05: qty 2

## 2020-05-05 MED ORDER — ACETAMINOPHEN 325 MG PO TABS
650.0000 mg | ORAL_TABLET | Freq: Four times a day (QID) | ORAL | Status: DC | PRN
  Administered 2020-05-06: 650 mg via ORAL
  Filled 2020-05-05: qty 2

## 2020-05-05 NOTE — H&P (Signed)
[place] [facility] [place] NAME: Sheryl Michael    MR#:  [id number]  DATE OF BIRTH:  1931-07-09  DATE OF ADMISSION:  05/04/2020  PRIMARY CARE PHYSICIAN: Idelle Crouch, MD   REQUESTING/REFERRING PHYSICIAN:   CHIEF COMPLAINT:   Chief Complaint  Patient presents with  . Flank Pain  . Shortness of Breath    HISTORY OF PRESENT ILLNESS:  Sheryl Michael  is a 86 y.o. Caucasian female with a known history of hypothyroidism, lesion, dyslipidemia, GERD, TIA and recurrent UTI, presented to the emergency room with acute onset of recent UTI with associated urinary frequency and urgency which have been improving with no fever or chills and for which she was seen on outpatient basis and managed with p.o. Macrobid.  After taking Macrobid had 1 dose started having weakness and flank pain as well as vomiting.  No chest pain or dyspnea or palpitations cough or wheezing or hemoptysis.  No diarrhea or melena or bright red blood per rectum.  No hematuria or flank pain.  Referring physician to the emergency room, blood pressure was 98/28 with temperature of 99.7 and later 98.2 and pulse oximetry 91% on room air and later 94 to 95%.  Respiratory it was normal and later was up to 34 and came back normal.  Labs revealed UA that was positive for UTI and lactic acid of 2.3 and later 2.2.  BMP was remarkable for creatinine 1.23 and high-sensitivity troponin I of 5.  CBC showed leukocytosis of 13.3.  Two-view chest x-ray showed chronic hyperinflated lungs with coarse interstitial changes and bronchial headache features with no acute cardiopulmonary disease.  It showed stable region of scarring in the right midlung.  The patient was given a liter bolus of IV lactated Ringer and 2 g of IV Azactam.  She will be admitted to a medically monitored bed for further evaluation and management. PAST MEDICAL HISTORY:   Past Medical History:  Diagnosis Date  . Arthritis   . Atrial fibrillation ([place])   .  Breast cancer ([place]) 2006   right breast ca with lumpectomy and rad tx  . Colon polyp   . Cystocele   . Depression   . Female bladder prolapse   . GERD (gastroesophageal reflux disease)   . Goiter   . Hyperlipemia   . Hypothyroidism   . Osteoporosis   . Personal history of radiation therapy 2006   right breast ca  . Pneumonia   . Procidentia of uterus   . Recurrent UTI   . Reflux   . TIA (transient ischemic attack)   . Vaginal atrophy     PAST SURGICAL HISTORY:   Past Surgical History:  Procedure Laterality Date  . APPENDECTOMY    . BREAST BIOPSY Right 2006   breast cancer  . BREAST LUMPECTOMY Right 2006   positive  . BREAST SURGERY Right    lumpectomy  . CATARACT EXTRACTION W/ INTRAOCULAR LENS  IMPLANT, BILATERAL    . COLONOSCOPY    . CYSTOCELE REPAIR N/A 12/21/2015   Procedure: ANTERIOR REPAIR (CYSTOCELE);  Surgeon: Brayton Mars, MD;  Location: [facility] ORS;  Service: Gynecology;  Laterality: N/A;  . DILATION AND CURETTAGE OF UTERUS    . ELECTROMAGNETIC NAVIGATION BROCHOSCOPY Right 12/13/2018   Procedure: ELECTROMAGNETIC NAVIGATION BRONCHOSCOPY;  Surgeon: Flora Lipps, MD;  Location: [facility] ORS;  Service: Cardiopulmonary;  Laterality: Right;  . EYE SURGERY    . VAGINAL HYSTERECTOMY Bilateral 12/21/2015   Procedure: TVH BSO;  Surgeon: Brayton Mars, MD;  Location: [facility] ORS;  Service: Gynecology;  Laterality: Bilateral;    SOCIAL HISTORY:   Social History   Tobacco Use  . Smoking status: Never Smoker  . Smokeless tobacco: Never Used  Substance Use Topics  . Alcohol use: No    FAMILY HISTORY:   Family History  Problem Relation Age of Onset  . Diabetes Sister   . Breast cancer Sister        late 43's  . Diabetes Brother   . Diabetes Sister     DRUG ALLERGIES:   Allergies  Allergen Reactions  . Bactrim [Sulfamethoxazole-Trimethoprim] Hives  . Iodinated Diagnostic Agents Anaphylaxis  . Codeine Nausea And Vomiting  . Erythromycin Ethylsuccinate  Other (See Comments)    Unknown   . Phenobarbital Other (See Comments)    Feeling funny, nervous  . Ciprofloxacin Rash  . Penicillin V Potassium Itching and Rash    Has patient had a PCN reaction causing immediate rash, facial/tongue/throat swelling, SOB or lightheadedness with hypotension: No Has patient had a PCN reaction causing severe rash involving mucus membranes or skin necrosis: No Has patient had a PCN reaction that required hospitalization: No Has patient had a PCN reaction occurring within the last 10 years: No If all of the above answers are "NO", then may proceed with Cephalosporin use.     REVIEW OF SYSTEMS:   ROS As per history of present illness. All pertinent systems were reviewed above. Constitutional, HEENT, cardiovascular, respiratory, GI, GU, musculoskeletal, neuro, psychiatric, endocrine, integumentary and hematologic systems were reviewed and are otherwise negative/unremarkable except for positive findings mentioned above in the HPI.   MEDICATIONS AT HOME:   Prior to Admission medications   Medication Sig Start Date End Date Taking? Authorizing Provider  acetaminophen (TYLENOL) 325 MG tablet Take 650 mg by mouth every 6 (six) hours as needed for moderate pain.   Yes [provider]  apixaban (ELIQUIS) 2.5 MG TABS tablet Take 1 tablet (2.5 mg total) by mouth 2 (two) times daily. Patient taking differently: Take 2.5 mg by mouth every 12 (twelve) hours.  05/31/17  Yes Demetrios Loll, MD  cyanocobalamin (,VITAMIN B-12,) 1000 MCG/ML injection Inject 1,000 mcg into the muscle every 28 (twenty-eight) days. 03/03/20  Yes [provider]  diltiazem (TIAZAC) 120 MG 24 hr capsule Take 180 mg by mouth daily.  11/26/19  Yes [provider]  escitalopram (LEXAPRO) 10 MG tablet Take 10 mg by mouth daily.    Yes [provider]  levothyroxine (SYNTHROID, LEVOTHROID) 50 MCG tablet Take 50 mcg by mouth daily before breakfast.   Yes [provider]  omeprazole (PRILOSEC OTC) 20 MG tablet Take 20 mg by mouth daily.    Yes [provider]  cephALEXin (KEFLEX) 500 MG capsule Take 1 capsule (500 mg total) by mouth 2 (two) times daily for 7 days. 05/04/20 05/11/20  Arta Silence, MD  conjugated estrogens (PREMARIN) vaginal cream Place 0.5 g vaginally 2 (two) times a week. Patient not taking: Reported on 05/05/2020 09/05/19   [provider]  diphenhydrAMINE (BENADRYL) 25 MG tablet Take 25 mg by mouth every 6 (six) hours as needed. Patient not taking: Reported on 05/05/2020    [provider]  feeding supplement, ENSURE ENLIVE, (ENSURE ENLIVE) LIQD Take 237 mLs by mouth 2 (two) times daily between meals. 03/08/20   Ezekiel Slocumb, DO  nitrofurantoin, macrocrystal-monohydrate, (MACROBID) 100 MG capsule Take 100 mg by mouth 2 (two) times daily. Patient not  taking: Reported on 05/05/2020 05/04/20   [provider]  oxybutynin (DITROPAN) 5 MG tablet Take 5 mg by mouth 2 (two) times daily. Patient not taking: Reported on 05/05/2020 05/17/19 05/16/20  [provider]  sodium chloride (OCEAN) 0.65 % SOLN nasal spray Place 1 spray into both nostrils daily as needed for congestion. Patient not taking: Reported on 05/05/2020    [provider]      VITAL SIGNS:  Blood pressure (!) 123/50, pulse 83, temperature 99.7 F (37.6 C), temperature source Oral, resp. rate 19, height 5\' 1"  (1.549 m), weight 58.5 kg, SpO2 94 %.  PHYSICAL EXAMINATION:  Physical Exam  GENERAL:  86 y.o.-year-old Caucasian female patient lying in the bed with no acute distress.  EYES: Pupils equal, round, reactive to light and accommodation. No scleral icterus. Extraocular muscles intact.  HEENT: Head atraumatic, normocephalic. Oropharynx and nasopharynx clear.  NECK:  Supple, no jugular venous distention. No thyroid enlargement, no tenderness.  LUNGS: Normal breath sounds bilaterally, no wheezing, rales,rhonchi or  crepitation. No use of accessory muscles of respiration.  CARDIOVASCULAR: Regular rate and rhythm, S1, S2 normal. No murmurs, rubs, or gallops.  ABDOMEN: Soft, nondistended, nontender. Bowel sounds present. No organomegaly or mass.  EXTREMITIES: No pedal edema, cyanosis, or clubbing.  NEUROLOGIC: Cranial nerves II through XII are intact. Muscle strength 5/5 in all extremities. Sensation intact. Gait not checked.  PSYCHIATRIC: The patient is alert and oriented x 3.  Normal affect and good eye contact. SKIN: No obvious rash, lesion, or ulcer.   LABORATORY PANEL:   CBC Recent Labs  Lab 05/04/20 1823  WBC 13.3*  HGB 12.0  HCT 37.6  PLT 328   ------------------------------------------------------------------------------------------------------------------  Chemistries  Recent Labs  Lab 05/04/20 1823  NA 139  K 3.7  CL 103  CO2 25  GLUCOSE 113*  BUN 16  CREATININE 1.23*  CALCIUM 8.9   ------------------------------------------------------------------------------------------------------------------  Cardiac Enzymes No results for input(s): TROPONINI in the last 168 hours. ------------------------------------------------------------------------------------------------------------------  RADIOLOGY:  DG Chest 2 View  Result Date: 05/04/2020 CLINICAL DATA:  Shortness of breath, vomiting and shortness of breath began after taking medication for UTI. EXAM: CHEST - 2 VIEW COMPARISON:  Radiograph 03/04/2020, CT 12/13/2018 FINDINGS: Chronically hyperinflated lungs with coarsened interstitial changes and bronchitic features. Stable appearance of a bandlike opacity in the right mid lung corresponding to architectural distortion seen on comparison cross-sectional imaging. No new consolidative opacity or convincing features of acute pulmonary edema. No pneumothorax or effusion. The aorta is calcified. The remaining cardiomediastinal contours are unremarkable. No acute osseous or soft tissue  abnormality. Degenerative changes are present in the imaged spine and shoulders. Chronic right breast calcifications are again seen. IMPRESSION: 1. No acute cardiopulmonary abnormality. 2. Chronically hyperinflated lungs with coarsened interstitial changes and bronchitic features. 3. Stable region of scarring in the right mid lung. Electronically Signed   By: Lovena Le M.D.   On: 05/04/2020 19:00      IMPRESSION AND PLAN:   1.  Recurrent UTI with subsequent sepsis without severe sepsis or septic shock.  This is manifested by leukocytosis, elevated lactic acid, hypotension and later tachypnea. -The patient will be admitted to a medical monitored bed. -She will be hydrated with IV normal saline. -We will continue IV antibiotic therapy with Azactam. -We will follow blood and urine cultures. -We will follow lactic acid level. -Macrobid will obviously be stopped and added to her allergies.  2.  Paroxysmal atrial fibrillation. -We will continue amiodarone, Cardizem CD and Eliquis.  3.  Dyslipidemia. -We will continue statin therapy.  4.  Hypothyroidism. -We will continue Synthroid and check TSH level.  5.  Depression. -We will continue Lexapro.  6.  GERD. -We will continue PPI therapy.  7.  DVT prophylaxis. -We will continue Eliquis.   All the records are reviewed and case discussed with ED provider. The plan of care was discussed in details with the patient (and family). I answered all questions. The patient agreed to proceed with the above mentioned plan. Further management will depend upon hospital course.   CODE STATUS: Partial code.  The patient has DNI only  Status is: Inpatient  Remains inpatient appropriate because:Altered mental status, Ongoing diagnostic testing needed not appropriate for outpatient work up, Unsafe d/c plan, IV treatments appropriate due to intensity of illness or inability to take PO and Inpatient level of care appropriate due to severity of  illness   Dispo: The patient is from: Home              Anticipated d/c is to: Home              Anticipated d/c date is: 2 days              Patient currently is not medically stable to d/c.   TOTAL TIME TAKING CARE OF THIS PATIENT: 55 minutes.    Christel Mormon M.D on 05/05/2020 at 2:21 AM  [facility]   From 7 PM-7 AM, contact night-coverage [email or web address]  CC: Primary care physician; Idelle Crouch, MD   Note: This dictation was prepared with Dragon dictation along with smaller phrase technology. Any transcriptional typo errors that result from this process are unintentional.

## 2020-05-05 NOTE — Consult Note (Signed)
Urology Consult  I have been asked to see the patient by Dr. Roosevelt Locks, for evaluation and management of recurrent UTI.  Chief Complaint: Urinary frequency and urgency, weakness, flank pain, vomiting  History of Present Illness: Sheryl Michael is a 86 y.o. year old female with PMH A fib on Eliquis, recurrent stage III cystocele despite anterior repair in 2017 now managed by [facility], breast cancer in 2006 s/p lumpectomy and radiation therapy, recurrent UTI, atrophic vaginitis previously prescribed vaginal estrogen cream, and TIA who presented to the ED yesterday with reports of the above.   Admission labs notable for creatinine 1.23 (baseline <1); WBC count 13.3; UA with >50 WBCs/hpf; and lactate 2.3. Urine culture pending; blood cultures pending with no growth at <12 hours. On antibiotics as below.  Leukocytosis uptrending today, 23.5. Creatinine downtrending, 1.06. Lactate stable at 2.2.  She underwent CT stone study on 03/05/2020 with no nephrolithiasis or hydronephrosis.  Recent positive urine cultures as follows: 04/30/2020 Pan-sensitive E faecalis (associated with urosepsis) 03/03/2020 Pan-sensitive E coli (associated with urosepsis) 06/04/2019 Nitrofurantoin-resistant E coli  She is accompanied today at the bedside by her son, who contributes to HPI. Patient reports a history of increasing frequency of UTIs over the past year. Her typical symptoms include frequency and dysuria; she is unable to differentiate bladder pain vs vulvar burning on interview. She has not seen a urologist before. She is not on any medications or supplements for UTI prevention. While she was previously prescribed topical vaginal estrogen cream, she states she never filled this because she did not think it was necessary.  She last saw [facility] on 10/04/2019 for a pessary fitting, which she failed. She was recommended to undergo vaginectomy at that point and instructed to obtain cardiac clearance prior. She  reports she did obtain clearance, however she is unsure if she is interested in vaginectomy. She is scheduled for a second opinion in our clinic with Dr. Matilde Sprang on 05/18/2020.  Anti-infectives (From admission, onward)   Start     Dose/Rate Route Frequency Ordered Stop   05/05/20 1100  aztreonam (AZACTAM) 1 g in sodium chloride 0.9 % 100 mL IVPB     Discontinue     1 g 200 mL/hr over 30 Minutes Intravenous Every 8 hours 05/05/20 1013     05/05/20 0900  aztreonam (AZACTAM) injection 1 g  Status:  Discontinued        1 g Intramuscular Every 8 hours 05/05/20 0221 05/05/20 0953   05/05/20 0045  aztreonam (AZACTAM) 2 g in sodium chloride 0.9 % 100 mL IVPB        2 g 200 mL/hr over 30 Minutes Intravenous  Once 05/05/20 0036 05/05/20 0223   05/04/20 2315  cephALEXin (KEFLEX) capsule 500 mg  Status:  Discontinued        500 mg Oral  Once 05/04/20 2305 05/05/20 0035   05/04/20 0000  cephALEXin (KEFLEX) 500 MG capsule     Discontinue     500 mg Oral 2 times daily 05/04/20 2259 05/11/20 2359       Past Medical History:  Diagnosis Date  . Arthritis   . Atrial fibrillation ([place])   . Breast cancer ([place]) 2006   right breast ca with lumpectomy and rad tx  . Colon polyp   . Cystocele   . Depression   . Female bladder prolapse   . GERD (gastroesophageal reflux disease)   . Goiter   . Hyperlipemia   . Hypothyroidism   .  Osteoporosis   . Personal history of radiation therapy 2006   right breast ca  . Pneumonia   . Procidentia of uterus   . Recurrent UTI   . Reflux   . TIA (transient ischemic attack)   . Vaginal atrophy     Past Surgical History:  Procedure Laterality Date  . APPENDECTOMY    . BREAST BIOPSY Right 2006   breast cancer  . BREAST LUMPECTOMY Right 2006   positive  . BREAST SURGERY Right    lumpectomy  . CATARACT EXTRACTION W/ INTRAOCULAR LENS  IMPLANT, BILATERAL    . COLONOSCOPY    . CYSTOCELE REPAIR N/A 12/21/2015   Procedure: ANTERIOR REPAIR (CYSTOCELE);  Surgeon:  Brayton Mars, MD;  Location: [facility] ORS;  Service: Gynecology;  Laterality: N/A;  . DILATION AND CURETTAGE OF UTERUS    . ELECTROMAGNETIC NAVIGATION BROCHOSCOPY Right 12/13/2018   Procedure: ELECTROMAGNETIC NAVIGATION BRONCHOSCOPY;  Surgeon: Flora Lipps, MD;  Location: [facility];  Service: Cardiopulmonary;  Laterality: Right;  . EYE SURGERY    . VAGINAL HYSTERECTOMY Bilateral 12/21/2015   Procedure: TVH BSO;  Surgeon: Brayton Mars, MD;  Location: [facility] OR[facility];  Service: Gynecology;  Laterality: Bilateral;    Home Medications:  Current Meds  Medication Sig  . acetaminophen (TYLENOL) 325 MG tablet Take 650 mg by mouth every 6 (six) hours as needed for moderate pain.  [place] Kitchen apixaban (ELIQUIS) 2.5 MG TABS tablet Take 1 tablet (2.5 mg total) by mouth 2 (two) times daily. (Patient taking differently: Take 2.5 mg by mouth every 12 (twelve) hours. )  . cyanocobalamin (,VITAMIN B-12,) 1000 MCG/ML injection Inject 1,000 mcg into the muscle every 28 (twenty-eight) days.  Marland Kitchen diltiazem (TIAZAC) 120 MG 24 hr capsule Take 180 mg by mouth daily.   Marland Kitchen escitalopram (LEXAPRO) 10 MG tablet Take 10 mg by mouth daily.   Marland Kitchen levothyroxine (SYNTHROID, LEVOTHROID) 50 MCG tablet Take 50 mcg by mouth daily before breakfast.  . omeprazole (PRILOSEC OTC) 20 MG tablet Take 20 mg by mouth daily.     Allergies:  Allergies  Allergen Reactions  . Bactrim [Sulfamethoxazole-Trimethoprim] Hives  . Iodinated Diagnostic Agents Anaphylaxis  . Codeine Nausea And Vomiting  . Erythromycin Ethylsuccinate Other (See Comments)    Unknown   . Phenobarbital Other (See Comments)    Feeling funny, nervous  . Ciprofloxacin Rash  . Penicillin V Potassium Itching and Rash    Has patient had a PCN reaction causing immediate rash, facial/tongue/throat swelling, SOB or lightheadedness with hypotension: No Has patient had a PCN reaction causing severe rash involving mucus membranes or skin necrosis: No Has patient had a PCN reaction that  required hospitalization: No Has patient had a PCN reaction occurring within the last 10 years: No If all of the above answers are "NO", then may proceed with Cephalosporin use.     Family History  Problem Relation Age of Onset  . Diabetes Sister   . Breast cancer Sister        late 66's  . Diabetes Brother   . Diabetes Sister     Social History:  reports that she has never smoked. She has never used smokeless tobacco. She reports that she does not drink alcohol and does not use drugs.  ROS: A complete review of systems was performed.  All systems are negative except for pertinent findings as noted.  Physical Exam:  Vital signs in last 24 hours: Temp:  [98.2 F (36.8 C)-99.7 F (37.6 C)] 98.4 F (36.9 C) (  07/20 1134) Pulse Rate:  [70-83] 75 (07/20 1134) Resp:  [16-19] 17 (07/20 1134) BP: (98-129)/(28-50) 111/42 (07/20 1134) SpO2:  [91 %-100 %] 99 % (07/20 1134) Weight:  [58.5 kg] 58.5 kg (07/19 1814) Constitutional:  Alert and oriented, no acute distress HEENT: [place] AT, moist mucus membranes Cardiovascular: No clubbing, cyanosis, or edema. Respiratory: Normal respiratory effort, lungs clear bilaterally GU: Exam limited by hospital bed, however patient noted to have significant bladder mobility with increased IAP. Skin: No rashes, bruises or suspicious lesions Neurologic: Grossly intact, no focal deficits, moving all 4 extremities Psychiatric: Normal mood and affect  Laboratory Data:  Recent Labs    05/04/20 1823 05/05/20 0434  WBC 13.3* 23.5*  HGB 12.0 10.2*  HCT 37.6 30.4*   Recent Labs    05/04/20 1823 05/05/20 0434  NA 139 140  K 3.7 4.0  CL 103 108  CO2 25 25  GLUCOSE 113* 133*  BUN 16 18  CREATININE 1.23* 1.06*  CALCIUM 8.9 8.4*   Urinalysis    Component Value Date/Time   COLORURINE YELLOW (A) 05/04/2020 2154   APPEARANCEUR HAZY (A) 05/04/2020 2154   LABSPEC 1.012 05/04/2020 2154   [place] 5.0 05/04/2020 2154   [place] 05/04/2020 2154     [place] NEGATIVE 05/04/2020 2154   [place] NEGATIVE 05/04/2020 2154   [place] NEGATIVE 05/04/2020 2154   PROTEINUR NEGATIVE 05/04/2020 2154   NITRITE NEGATIVE 05/04/2020 2154   LEUKOCYTESUR SMALL (A) 05/04/2020 2154   Results for orders placed or performed during the hospital encounter of 05/04/20  Culture, blood (routine x 2)     Status: None (Preliminary result)   Collection Time: 05/05/20 12:57 AM   Specimen: BLOOD LEFT HAND  Result Value Ref Range Status   Specimen Description BLOOD LEFT HAND  Final   Special Requests   Final    BOTTLES DRAWN AEROBIC AND ANAEROBIC Blood Culture results may not be optimal due to an excessive volume of blood received in culture bottles   Culture   Final    NO GROWTH < 12 HOURS Performed at [facility], [address] [place], [place] [address]    Report Status PENDING  Incomplete  Culture, blood (routine x 2)     Status: None (Preliminary result)   Collection Time: 05/05/20 12:57 AM   Specimen: Left Antecubital; Blood  Result Value Ref Range Status   Specimen Description LEFT ANTECUBITAL  Final   Special Requests   Final    BOTTLES DRAWN AEROBIC AND ANAEROBIC Blood Culture results may not be optimal due to an excessive volume of blood received in culture bottles   Culture   Final    NO GROWTH < 12 HOURS Performed at [facility], [address], [place], [place] [address]    Report Status PENDING  Incomplete   Assessment & Plan:  86 year old female with PMH recurrent cystocele despite repair, breast cancer in 2006, atrophic vaginitis, and recurrent urosepsis admitted with likely urosepsis with pan-sensitive E faecalis. Increasing leukocytosis on aztreonam.  Patient is not yet on a recurrent UTI prophylaxis regimen. We discussed this today, recommendations below. Given her recent recurrent urosepsis, daily suppressive antibiotic prophylaxis would be appropriate. Patient has valid concerns about antibiotic resistance  with this; ultimately will defer to her wishes on this.  I recommend she keep her upcoming second opinion with Dr. Matilde Sprang. I explained that the role of her cystocele in her rUTIs is unclear and that further repair may or may not alter  the frequency of infections. She will likely also require outpatient cystoscopy with Dr. Matilde Sprang for further evaluation of her recent infections; I will communicate directly with him regarding this.  Recommendations:  Switch aztreonam to more culture-appropriate antibiotics. Continue antibiotics for a total of 10 days of therapy.  Start a UTI prevention regimen:   Cranberry supplements twice daily, ideally on an empty stomach  Probiotics daily, preferably containing lactobacillus  Topical vaginal estrogen cream, apply a pea-sized amount at the urethral meatus three times weekly  Antibiotic prophylaxis, nitrofurantoin 100mg  daily (start after completing treatment for her current infection)  Outpatient follow up with Dr. Matilde Sprang  Thank you for involving me in this patient's care, please page with any further questions or concerns.  Debroah Loop, PA-C 05/05/2020 12:56 PM

## 2020-05-05 NOTE — Plan of Care (Signed)
  Problem: Education: Goal: Knowledge of General Education information will improve Description: Including pain rating scale, medication(s)/side effects and non-pharmacologic comfort measures Outcome: Progressing   Problem: Activity: Goal: Risk for activity intolerance will decrease Outcome: Progressing   Problem: Nutrition: Goal: Adequate nutrition will be maintained Outcome: Progressing   

## 2020-05-05 NOTE — Consult Note (Signed)
Pharmacy Antibiotic Note  Sheryl Michael is a 87 y.o. female admitted on 05/04/2020 with recurrent UTI. Patient Ucx from 04/30/2020 from [facility] show Enterococcus faecalis . Patient has reported allergy to PCN.    Pharmacy has been consulted for Vancomycin dosing.  Plan: Vancomycin 1250mg  IV x 1 loading dose.  Will order vancomycin 500mg  IV every 24 hours.   Pharmacy will monitor renal function and adjust dose as needed.  F/U Ucx and sensitivities.   Height: 5\' 1"  (154.9 cm) Weight: 58.5 kg (129 lb) IBW/kg (Calculated) : 47.8  Temp (24hrs), Avg:98.5 F (36.9 C), Min:98.2 F (36.8 C), Max:98.9 F (37.2 C)  Recent Labs  Lab 05/04/20 1823 05/04/20 2309 05/05/20 0057 05/05/20 0434  WBC 13.3*  --   --  23.5*  CREATININE 1.23*  --   --  1.06*  LATICACIDVEN  --  2.3* 2.2*  --     Estimated Creatinine Clearance: 30.8 mL/min (A) (by C-G formula based on SCr of 1.06 mg/dL (H)).    Allergies  Allergen Reactions  . Bactrim [Sulfamethoxazole-Trimethoprim] Hives  . Iodinated Diagnostic Agents Anaphylaxis  . Codeine Nausea And Vomiting  . Erythromycin Ethylsuccinate Other (See Comments)    Unknown   . Phenobarbital Other (See Comments)    Feeling funny, nervous  . Ciprofloxacin Rash  . Penicillin V Potassium Itching and Rash    Has patient had a PCN reaction causing immediate rash, facial/tongue/throat swelling, SOB or lightheadedness with hypotension: No Has patient had a PCN reaction causing severe rash involving mucus membranes or skin necrosis: No Has patient had a PCN reaction that required hospitalization: No Has patient had a PCN reaction occurring within the last 10 years: No If all of the above answers are "NO", then may proceed with Cephalosporin use.     Antimicrobials this admission: 7/20 aztreonam  >>  7/20 vancomycin  >>   Microbiology results: 7/19  BCx: pending 7/19  UCx: pending  Thank you for allowing pharmacy to be a part of this patient's care.  Pernell Dupre, PharmD, BCPS Clinical Pharmacist 05/05/2020 7:39 PM

## 2020-05-05 NOTE — Progress Notes (Addendum)
PROGRESS NOTE    Sheryl Michael  WRU:[id number] DOB: July 10, 1931 DOA: 05/04/2020 PCP: Idelle Crouch, MD   Chief complaint.  Right flank pain.  Brief Narrative: Sheryl Michael  is a 86 y.o. Caucasian female with a known history of hypothyroidism, lesion, dyslipidemia, GERD, TIA and recurrent UTI, presented to the emergency room with acute onset of recent UTI with associated urinary frequency and urgency which have been improving with no fever or chills and for which she was seen on outpatient basis and managed with p.o. Macrobid.  After taking Macrobid had 1 dose started having weakness and flank pain as well as vomiting.  Upon arrival in the emergency room, patient was diagnosed with UTI and sepsis.  She had a lactic acid of 2.2 and leukocytosis, tachycardia and low blood pressure.  He received IV fluid bolus, also started on antibiotics with aztreonam.  Spoke with patient daughter this morning, patient had 3 episodes of UTI and sepsis over the last month.  She was told to have a prolapse of the bladder.  Has not been seen by urology.  Assessment & Plan:   Active Problems:   Recurrent UTI  #1.  Recurrent UTI with pyelonephritis and sepsis. Condition is stable today.  Continue some IV fluids.  Pending culture results.  Continue antibiotics with aztreonam.  We will also consult urology per request from family. Urine culture on 04/30/2020 in [facility] showed Enterococcus faecalis.  Susceptible to vancomycin.  Added vancomycin.  2.  Paroxysmal atrial fibrillation. Continue Cardizem and Eliquis, amiodarone.  3.  Hypothyroidism.  Continue home medicines.  4.  Acute kidney injury on chronic kidney disease stage IIIa. Reviewed patient record, patient has chronic kidney disease stage III  We will continue IV fluids for now.  Recheck a BMP tomorrow.     DVT prophylaxis: Eliquis Code Status: Full Family Communication: Discussed with granddaughter in patient room, all questions  answered. Disposition Plan:  . Patient came from: Home            . Anticipated d/c place: ? . Barriers to d/c OR conditions which need to be met to effect a safe d/c:   Consultants:   Urology  Procedures: None Antimicrobials:  Aztreonam  Subjective: Patient still complaining of right flank pain.  Some dysuria.  No hematuria.  No fever or chills. No nausea vomiting abdominal pain. No short of breath or cough.  Objective: Vitals:   05/04/20 2135 05/05/20 0030 05/05/20 0248 05/05/20 0751  BP: (!) 123/50 (!) 110/44 (!) 129/45 (!) 100/38  Pulse: 83 79 80 70  Resp: _0 Temp:   98.2 F (36.8 C) 98.3 F (36.8 C)  TempSrc:   Oral Oral  SpO2: 94% 96% 100% 97%  Weight:      Height:       No intake or output data in the 24 hours ending 05/05/20 0927 Filed Weights   05/04/20 1814  Weight: 58.5 kg    Examination:  General exam: Appears calm and comfortable  Respiratory system: Clear to auscultation. Respiratory effort normal. Cardiovascular system: Regular.  No JVD, murmurs, rubs, gallops or clicks. No pedal edema. Gastrointestinal system: Abdomen is nondistended, soft and nontender. No organomegaly or masses felt. Normal bowel sounds heard. Central nervous system: Alert and oriented. No focal neurological deficits. Extremities: Symmetric  Skin: No rashes, lesions or ulcers Psychiatry: Mood & affect appropriate.     Data Reviewed: I have personally reviewed following labs and imaging studies  CBC: Recent  Labs  Lab 05/04/20 1823 05/05/20 0434  WBC 13.3* 23.5*  HGB 12.0 10.2*  HCT 37.6 30.4*  MCV 95.4 91.8  PLT 328 956   Basic Metabolic Panel: Recent Labs  Lab 05/04/20 1823 05/05/20 0434  NA 139 140  K 3.7 4.0  CL 103 108  CO2 25 25  GLUCOSE 113* 133*  BUN 16 18  CREATININE 1.23* 1.06*  CALCIUM 8.9 8.4*   GFR: Estimated Creatinine Clearance: 30.8 mL/min (A) (by C-G formula based on SCr of 1.06 mg/dL (H)). Liver Function Tests: No results  for input(s): AST, ALT, ALKPHOS, BILITOT, PROT, ALBUMIN in the last 168 hours. No results for input(s): LIPASE, AMYLASE in the last 168 hours. No results for input(s): AMMONIA in the last 168 hours. Coagulation Profile: No results for input(s): INR, PROTIME in the last 168 hours. Cardiac Enzymes: No results for input(s): CKTOTAL, CKMB, CKMBINDEX, TROPONINI in the last 168 hours. BNP (last 3 results) No results for input(s): PROBNP in the last 8760 hours. HbA1C: No results for input(s): HGBA1C in the last 72 hours. CBG: No results for input(s): GLUCAP in the last 168 hours. Lipid Profile: No results for input(s): CHOL, HDL, LDLCALC, TRIG, CHOLHDL, LDLDIRECT in the last 72 hours. Thyroid Function Tests: No results for input(s): TSH, T4TOTAL, FREET4, T3FREE, THYROIDAB in the last 72 hours. Anemia Panel: No results for input(s): VITAMINB12, FOLATE, FERRITIN, TIBC, IRON, RETICCTPCT in the last 72 hours. Sepsis Labs: Recent Labs  Lab 05/04/20 2309 05/05/20 0057  LATICACIDVEN 2.3* 2.2*    Recent Results (from the past 240 hour(s))  Culture, blood (routine x 2)     Status: None (Preliminary result)   Collection Time: 05/05/20 12:57 AM   Specimen: BLOOD LEFT HAND  Result Value Ref Range Status   Specimen Description BLOOD LEFT HAND  Final   Special Requests   Final    BOTTLES DRAWN AEROBIC AND ANAEROBIC Blood Culture results may not be optimal due to an excessive volume of blood received in culture bottles   Culture   Final    NO GROWTH < 12 HOURS Performed at [facility], [address] [place], [place] [address]    Report Status PENDING  Incomplete  Culture, blood (routine x 2)     Status: None (Preliminary result)   Collection Time: 05/05/20 12:57 AM   Specimen: Left Antecubital; Blood  Result Value Ref Range Status   Specimen Description LEFT ANTECUBITAL  Final   Special Requests   Final    BOTTLES DRAWN AEROBIC AND ANAEROBIC Blood Culture results may not be  optimal due to an excessive volume of blood received in culture bottles   Culture   Final    NO GROWTH < 12 HOURS Performed at [facility], [address] [place], [place] [address]    Report Status PENDING  Incomplete         Radiology Studies: DG Chest 2 View  Result Date: 05/04/2020 CLINICAL DATA:  Shortness of breath, vomiting and shortness of breath began after taking medication for UTI. EXAM: CHEST - 2 VIEW COMPARISON:  Radiograph 03/04/2020, CT 12/13/2018 FINDINGS: Chronically hyperinflated lungs with coarsened interstitial changes and bronchitic features. Stable appearance of a bandlike opacity in the right mid lung corresponding to architectural distortion seen on comparison cross-sectional imaging. No new consolidative opacity or convincing features of acute pulmonary edema. No pneumothorax or effusion. The aorta is calcified. The remaining cardiomediastinal contours are unremarkable. No acute osseous or soft tissue abnormality. Degenerative changes are  present in the imaged spine and shoulders. Chronic right breast calcifications are again seen. IMPRESSION: 1. No acute cardiopulmonary abnormality. 2. Chronically hyperinflated lungs with coarsened interstitial changes and bronchitic features. 3. Stable region of scarring in the right mid lung. Electronically Signed   By: Lovena Le M.D.   On: 05/04/2020 19:00        Scheduled Meds: . apixaban  2.5 mg Oral Q12H  . aztreonam  1 g Intramuscular Q8H  . [START ON 05/31/2020] cyanocobalamin  1,000 mcg Intramuscular Q28 days  . escitalopram  10 mg Oral Daily  . feeding supplement (ENSURE ENLIVE)  237 mL Oral BID BM  . levothyroxine  50 mcg Oral QAC breakfast  . pantoprazole  40 mg Oral Daily   Continuous Infusions: . sodium chloride 100 mL/hr at 05/05/20 0320     LOS: 0 days    Time spent: 28 minutes    Sharen Hones, MD [facility]   To contact the attending provider between 7A-7P or the covering  provider during after hours 7P-7A, please log into the web site [email or web address] and access using universal [place] password for that web site. If you do not have the password, please call the hospital operator.  05/05/2020, 9:27 AM

## 2020-05-05 NOTE — Progress Notes (Signed)
PHARMACY -  BRIEF ANTIBIOTIC NOTE   Pharmacy has received consult(s) for Aztreonam from an ED provider.  The patient's profile has been reviewed for ht/wt/allergies/indication/available labs.    One time order(s) placed for Aztreonam 2gm  Further antibiotics/pharmacy consults should be ordered by admitting physician if indicated.                       Thank you, Hart Robinsons A 05/05/2020  12:48 AM

## 2020-05-06 DIAGNOSIS — N189 Chronic kidney disease, unspecified: Secondary | ICD-10-CM

## 2020-05-06 DIAGNOSIS — N179 Acute kidney failure, unspecified: Secondary | ICD-10-CM

## 2020-05-06 LAB — BASIC METABOLIC PANEL
Anion gap: 7 (ref 5–15)
BUN: 16 mg/dL (ref 8–23)
CO2: 23 mmol/L (ref 22–32)
Calcium: 8.4 mg/dL — ABNORMAL LOW (ref 8.9–10.3)
Chloride: 109 mmol/L (ref 98–111)
Creatinine, Ser: 0.91 mg/dL (ref 0.44–1.00)
GFR calc Af Amer: >60 mL/min (ref >60)
GFR calc non Af Amer: 57 mL/min — ABNORMAL LOW (ref >60)
Glucose, Bld: 82 mg/dL (ref 70–99)
Potassium: 4.2 mmol/L (ref 3.5–5.1)
Sodium: 139 mmol/L (ref 135–145)

## 2020-05-06 LAB — CBC WITH DIFFERENTIAL/PLATELET
Abs Immature Granulocytes: 0.03 10*3/uL (ref 0.00–0.07)
Basophils Absolute: 0 10*3/uL (ref 0.0–0.1)
Basophils Relative: 0 %
Eosinophils Absolute: 0.5 10*3/uL (ref 0.0–0.5)
Eosinophils Relative: 4 %
HCT: 32.7 % — ABNORMAL LOW (ref 36.0–46.0)
Hemoglobin: 10.4 g/dL — ABNORMAL LOW (ref 12.0–15.0)
Immature Granulocytes: 0 %
Lymphocytes Relative: 12 %
Lymphs Abs: 1.4 10*3/uL (ref 0.7–4.0)
MCH: 30.4 pg (ref 26.0–34.0)
MCHC: 31.8 g/dL (ref 30.0–36.0)
MCV: 95.6 fL (ref 80.0–100.0)
Monocytes Absolute: 0.6 10*3/uL (ref 0.1–1.0)
Monocytes Relative: 6 %
Neutro Abs: 8.6 10*3/uL — ABNORMAL HIGH (ref 1.7–7.7)
Neutrophils Relative %: 78 %
Platelets: 235 10*3/uL (ref 150–400)
RBC: 3.42 MIL/uL — ABNORMAL LOW (ref 3.87–5.11)
RDW: 14.6 % (ref 11.5–15.5)
WBC: 11.2 10*3/uL — ABNORMAL HIGH (ref 4.0–10.5)
nRBC: 0 % (ref 0.0–0.2)

## 2020-05-06 LAB — MAGNESIUM: Magnesium: 1.9 mg/dL (ref 1.7–2.4)

## 2020-05-06 MED ORDER — AMOXICILLIN 250 MG PO CAPS
250.0000 mg | ORAL_CAPSULE | Freq: Once | ORAL | Status: AC
  Administered 2020-05-06: 250 mg via ORAL
  Filled 2020-05-06: qty 1

## 2020-05-06 MED ORDER — DILTIAZEM HCL ER COATED BEADS 120 MG PO CP24
120.0000 mg | ORAL_CAPSULE | Freq: Every day | ORAL | Status: DC
  Administered 2020-05-06: 120 mg via ORAL
  Filled 2020-05-06 (×4): qty 1

## 2020-05-06 MED ORDER — BACID PO TABS
2.0000 | ORAL_TABLET | Freq: Three times a day (TID) | ORAL | Status: DC
  Filled 2020-05-06 (×2): qty 2

## 2020-05-06 MED ORDER — RISAQUAD PO CAPS
2.0000 | ORAL_CAPSULE | Freq: Three times a day (TID) | ORAL | Status: DC
  Administered 2020-05-06 – 2020-05-07 (×3): 2 via ORAL
  Filled 2020-05-06 (×5): qty 2

## 2020-05-06 MED ORDER — SODIUM CHLORIDE 0.45 % IV BOLUS
250.0000 mL | Freq: Once | INTRAVENOUS | Status: AC
  Administered 2020-05-06: 250 mL via INTRAVENOUS

## 2020-05-06 NOTE — Consult Note (Signed)
[facility] Consultation Note  Patient ID: Sheryl Michael, MRN: [id number], DOB/AGE: [birth year] 86 y.o. Admit date: 05/04/2020   Date of Consult: 05/06/2020 Primary Physician: Idelle Crouch, MD Primary Cardiologist: [facility]  Chief Complaint:  Chief Complaint  Patient presents with  . Flank Pain  . Shortness of Breath   Reason for Consult: Atrial fibrillation  HPI: 86 y.o. female with known paroxysmal nonvalvular atrial fibrillation borderline hypertension peripheral vascular disease with history of TIA having a urinary tract infection which appears to be recurrent.  The patient has previously been on Cardizem for heart rate control of atrial fibrillation and maintenance of normal sinus rhythm and Eliquis for further risk reduction and stroke.  She had a urinary tract infection for which she had some issues and then has come into the hospital with some anemia with a hemoglobin of 10.2g chronic kidney disease with a glomerular filtration rate of 55 and some hyperlipidemia.  On admission she had normal sinus rhythm but now has atrial fibrillation with controlled ventricular rate.  She has no significant symptoms of that at this time and has no concerns at this time there is no evidence of myocardial infarction chest pain or heart failure  Past Medical History:  Diagnosis Date  . Arthritis   . Atrial fibrillation ([place])   . Breast cancer ([place]) 2006   right breast ca with lumpectomy and rad tx  . Colon polyp   . Cystocele   . Depression   . Female bladder prolapse   . GERD (gastroesophageal reflux disease)   . Goiter   . Hyperlipemia   . Hypothyroidism   . Osteoporosis   . Personal history of radiation therapy 2006   right breast ca  . Pneumonia   . Procidentia of uterus   . Recurrent UTI   . Reflux   . TIA (transient ischemic attack)   . Vaginal atrophy       Surgical History:  Past Surgical History:  Procedure Laterality Date  . APPENDECTOMY    . BREAST  BIOPSY Right 2006   breast cancer  . BREAST LUMPECTOMY Right 2006   positive  . BREAST SURGERY Right    lumpectomy  . CATARACT EXTRACTION W/ INTRAOCULAR LENS  IMPLANT, BILATERAL    . COLONOSCOPY    . CYSTOCELE REPAIR N/A 12/21/2015   Procedure: ANTERIOR REPAIR (CYSTOCELE);  Surgeon: Brayton Mars, MD;  Location: [facility] ORS;  Service: Gynecology;  Laterality: N/A;  . DILATION AND CURETTAGE OF UTERUS    . ELECTROMAGNETIC NAVIGATION BROCHOSCOPY Right 12/13/2018   Procedure: ELECTROMAGNETIC NAVIGATION BRONCHOSCOPY;  Surgeon: Flora Lipps, MD;  Location: [facility] ORS;  Service: Cardiopulmonary;  Laterality: Right;  . EYE SURGERY    . VAGINAL HYSTERECTOMY Bilateral 12/21/2015   Procedure: TVH BSO;  Surgeon: Brayton Mars, MD;  Location: [facility] ORS;  Service: Gynecology;  Laterality: Bilateral;     Home Meds: Prior to Admission medications   Medication Sig Start Date End Date Taking? Authorizing Provider  acetaminophen (TYLENOL) 325 MG tablet Take 650 mg by mouth every 6 (six) hours as needed for moderate pain.   Yes [provider]  apixaban (ELIQUIS) 2.5 MG TABS tablet Take 1 tablet (2.5 mg total) by mouth 2 (two) times daily. Patient taking differently: Take 2.5 mg by mouth every 12 (twelve) hours.  05/31/17  Yes Demetrios Loll, MD  cyanocobalamin (,VITAMIN B-12,) 1000 MCG/ML injection Inject 1,000 mcg into the muscle every 28 (twenty-eight) days. 03/03/20  Yes [provider]  diltiazem (TIAZAC) 120 MG 24 hr capsule Take 180 mg by mouth daily.  11/26/19  Yes [provider]  escitalopram (LEXAPRO) 10 MG tablet Take 10 mg by mouth daily.    Yes [provider]  levothyroxine (SYNTHROID, LEVOTHROID) 50 MCG tablet Take 50 mcg by mouth daily before breakfast.   Yes [provider]  omeprazole (PRILOSEC OTC) 20 MG tablet Take 20 mg by mouth daily.    Yes [provider]  cephALEXin (KEFLEX) 500 MG capsule Take 1 capsule (500 mg total) by mouth 2  (two) times daily for 7 days. 05/04/20 05/11/20  Arta Silence, MD  conjugated estrogens (PREMARIN) vaginal cream Place 0.5 g vaginally 2 (two) times a week. Patient not taking: Reported on 05/05/2020 09/05/19   [provider]  diphenhydrAMINE (BENADRYL) 25 MG tablet Take 25 mg by mouth every 6 (six) hours as needed. Patient not taking: Reported on 05/05/2020    [provider]  feeding supplement, ENSURE ENLIVE, (ENSURE ENLIVE) LIQD Take 237 mLs by mouth 2 (two) times daily between meals. 03/08/20   Ezekiel Slocumb, DO  nitrofurantoin, macrocrystal-monohydrate, (MACROBID) 100 MG capsule Take 100 mg by mouth 2 (two) times daily. Patient not taking: Reported on 05/05/2020 05/04/20   [provider]  oxybutynin (DITROPAN) 5 MG tablet Take 5 mg by mouth 2 (two) times daily. Patient not taking: Reported on 05/05/2020 05/17/19 05/16/20  [provider]  sodium chloride (OCEAN) 0.65 % SOLN nasal spray Place 1 spray into both nostrils daily as needed for congestion. Patient not taking: Reported on 05/05/2020    [provider]    Inpatient Medications:  . acidophilus  2 capsule Oral TID  . apixaban  2.5 mg Oral Q12H  . [START ON 05/31/2020] cyanocobalamin  1,000 mcg Intramuscular Q28 days  . escitalopram  10 mg Oral Daily  . feeding supplement (ENSURE ENLIVE)  237 mL Oral BID BM  . levothyroxine  50 mcg Oral QAC breakfast  . pantoprazole  40 mg Oral Daily   . sodium chloride 75 mL/hr at 05/06/20 0340  . vancomycin      Allergies:  Allergies  Allergen Reactions  . Bactrim [Sulfamethoxazole-Trimethoprim] Hives  . Iodinated Diagnostic Agents Anaphylaxis  . Codeine Nausea And Vomiting  . Erythromycin Ethylsuccinate Other (See Comments)    Unknown   . Phenobarbital Other (See Comments)    Feeling funny, nervous  . Ciprofloxacin Rash  . Penicillin V Potassium Itching and Rash    Has patient had a PCN reaction causing immediate rash,  facial/tongue/throat swelling, SOB or lightheadedness with hypotension: No Has patient had a PCN reaction causing severe rash involving mucus membranes or skin necrosis: No Has patient had a PCN reaction that required hospitalization: No Has patient had a PCN reaction occurring within the last 10 years: No If all of the above answers are "NO", then may proceed with Cephalosporin use.     Social History   Socioeconomic History  . Marital status: Widowed    Spouse name: Not on file  . Number of children: Not on file  . Years of education: Not on file  . Highest education level: Not on file  Occupational History  . Not on file  Tobacco Use  . Smoking status: Never Smoker  . Smokeless tobacco: Never Used  Vaping Use  . Vaping Use: Never used  Substance and Sexual Activity  . Alcohol use: No  . Drug use: No  . Sexual activity: Never  Birth control/protection: Post-menopausal  Other Topics Concern  . Not on file  Social History Narrative  . Not on file   Social Determinants of Health   Financial Resource Strain:   . Difficulty of Paying Living Expenses:   Food Insecurity:   . Worried About Charity fundraiser in the Last Year:   . Arboriculturist in the Last Year:   Transportation Needs:   . Film/video editor (Medical):   Marland Kitchen Lack of Transportation (Non-Medical):   Physical Activity:   . Days of Exercise per Week:   . Minutes of Exercise per Session:   Stress:   . Feeling of Stress :   Social Connections:   . Frequency of Communication with Friends and Family:   . Frequency of Social Gatherings with Friends and Family:   . Attends Religious Services:   . Active Member of Clubs or Organizations:   . Attends Archivist Meetings:   Marland Kitchen Marital Status:   Intimate Partner Violence:   . Fear of Current or Ex-Partner:   . Emotionally Abused:   Marland Kitchen Physically Abused:   . Sexually Abused:      Family History  Problem Relation Age of Onset  . Diabetes Sister    . Breast cancer Sister        late 94's  . Diabetes Brother   . Diabetes Sister      Review of Systems Positive for weakness Negative for: General:  chills, fever, night sweats or weight changes.  Cardiovascular: PND orthopnea syncope dizziness  Dermatological skin lesions rashes Respiratory: Cough congestion Urologic: Frequent urination urination at night and hematuria Abdominal: negative for nausea, vomiting, diarrhea, bright red blood per rectum, melena, or hematemesis Neurologic: negative for visual changes, and/or hearing changes  All other systems reviewed and are otherwise negative except as noted above.  Labs: No results for input(s): CKTOTAL, CKMB, TROPONINI in the last 72 hours. Lab Results  Component Value Date   WBC 11.2 (H) 05/06/2020   HGB 10.4 (L) 05/06/2020   HCT 32.7 (L) 05/06/2020   MCV 95.6 05/06/2020   PLT 235 05/06/2020    Recent Labs  Lab 05/06/20 0458  NA 139  K 4.2  CL 109  CO2 23  BUN 16  CREATININE 0.91  CALCIUM 8.4*  GLUCOSE 82   Lab Results  Component Value Date   CHOL 137 04/15/2013   HDL 38 (L) 04/15/2013   LDLCALC 82 04/15/2013   TRIG 85 04/15/2013   No results found for: DDIMER  Radiology/Studies:  DG Chest 2 View  Result Date: 05/04/2020 CLINICAL DATA:  Shortness of breath, vomiting and shortness of breath began after taking medication for UTI. EXAM: CHEST - 2 VIEW COMPARISON:  Radiograph 03/04/2020, CT 12/13/2018 FINDINGS: Chronically hyperinflated lungs with coarsened interstitial changes and bronchitic features. Stable appearance of a bandlike opacity in the right mid lung corresponding to architectural distortion seen on comparison cross-sectional imaging. No new consolidative opacity or convincing features of acute pulmonary edema. No pneumothorax or effusion. The aorta is calcified. The remaining cardiomediastinal contours are unremarkable. No acute osseous or soft tissue abnormality. Degenerative changes are present in the  imaged spine and shoulders. Chronic right breast calcifications are again seen. IMPRESSION: 1. No acute cardiopulmonary abnormality. 2. Chronically hyperinflated lungs with coarsened interstitial changes and bronchitic features. 3. Stable region of scarring in the right mid lung. Electronically Signed   By: Lovena Le M.D.   On: 05/04/2020 19:00    EKG:  Normal sinus rhythm otherwise telemetry shows atrial fibrillation with controlled ventricular rate  Weights: Filed Weights   05/04/20 1814  Weight: 58.5 kg     Physical Exam: Blood pressure 136/69, pulse (!) 103, temperature 98 F (36.7 C), temperature source Oral, resp. rate 16, height 5\' 1"  (1.549 m), weight 58.5 kg, SpO2 98 %. Body mass index is 24.37 kg/m. General: Well developed, well nourished, in no acute distress. Head eyes ears nose throat: Normocephalic, atraumatic, sclera non-icteric, no xanthomas, nares are without discharge. No apparent thyromegaly and/or mass  Lungs: Normal respiratory effort.  no wheezes, no rales, no rhonchi.  Heart: Irregular with normal S1 S2. no murmur gallop, no rub, PMI is normal size and placement, carotid upstroke normal without bruit, jugular venous pressure is normal Abdomen: Soft, non-tender, non-distended with normoactive bowel sounds. No hepatomegaly. No rebound/guarding. No obvious abdominal masses. Abdominal aorta is normal size without bruit Extremities: No edema. no cyanosis, no clubbing, no ulcers  Peripheral : 2+ bilateral upper extremity pulses, 2+ bilateral femoral pulses, 2+ bilateral dorsal pedal pulse Neuro: Alert and oriented. No facial asymmetry. No focal deficit. Moves all extremities spontaneously. Musculoskeletal: Normal muscle tone without kyphosis Psych:  Responds to questions appropriately with a normal affect.    Assessment: 86 year old female with anemia chronic kidney disease with a current urinary tract infection causing paroxysmal nonvalvular atrial fibrillation with  variable heart rate without evidence of congestive heart failure or myocardial infarction  Plan: 1.  Reinstatement of Cardizem for heart rate control with a goal heart rate between 60 and 100 bpm now that blood pressure is improved 2.  Continue anticoagulation for further risk reduction in stroke with atrial fibrillation 3.  No further cardiac diagnostics necessary at this time due to no evidence of congestive heart failure or myocardial infarction 3.  Continue supportive care of infection and weakness and fatigue without restriction  Signed, Corey Skains M.D. [facility] 05/06/2020, 6:16 PM

## 2020-05-06 NOTE — TOC Progression Note (Signed)
Transition of Care [facility]) - Progression Note    Patient Details  Name: Sheryl Michael MRN: [id number] Date of Birth: 08/25/1931  Transition [facility] Care [facility]) CM/SW [place], RN Phone Number: 05/06/2020, 11:24 AM  Clinical Narrative:    Met with the patient and her son in the room She lives at home alone but has family support with her son and grand daughter She has DME at home and a ramp into her home She is independent at home and still drives She is up to date with PCP and can afford her medications She states that she has no needs    Expected Discharge Plan: Home/Self Care Barriers to Discharge: Continued Medical Work up  Expected Discharge Plan and Services Expected Discharge Plan: Home/Self Care   Discharge Planning Services: CM Consult   Living arrangements for the past 2 months: Single Family Home                 DME Arranged: N/A         HH Arranged: NA           Social Determinants of Health (SDOH) Interventions    Readmission Risk Interventions No flowsheet data found.

## 2020-05-06 NOTE — Progress Notes (Signed)
PROGRESS NOTE    Sheryl Michael  ZOX:[id number] DOB: 09/17/1931 DOA: 05/04/2020 PCP: Idelle Crouch, MD    Brief Narrative:  Sheryl Michael  is a 86 y.o. Caucasian female with a known history of hypothyroidism, lesion, dyslipidemia, GERD, TIA and recurrent UTI, presented to the emergency room with acute onset of recent UTI with associated urinary frequency and urgency which have been improving with no fever or chills and for which she was seen on outpatient basis and managed with p.o. Macrobid.  After taking Macrobid had 1 dose started having weakness and flank pain as well as vomiting.  No chest pain or dyspnea or palpitations cough or wheezing or hemoptysis.  No diarrhea or melena or bright red blood per rectum.  No hematuria or flank pain.  Referring physician to the emergency room, blood pressure was 98/28 with temperature of 99.7 and later 98.2 and pulse oximetry 91% on room air and later 94 to 95%.  Respiratory it was normal and later was up to 34 and came back normal.  Labs revealed UA that was positive for UTI and lactic acid of 2.3 and later 2.2.  BMP was remarkable for creatinine 1.23 and high-sensitivity troponin I of 5.  CBC showed leukocytosis of 13.3.  Two-view chest x-ray showed chronic hyperinflated lungs with coarse interstitial changes and bronchial headache features with no acute cardiopulmonary disease.  It showed stable region of scarring in the right midlung.  The patient was given a liter bolus of IV lactated Ringer and 2 g of IV Azactam.  She will be admitted to a medically monitored bed for further evaluation and management.    Consultants:   urology  Procedures:   Antimicrobials:    Aztreonam   Subjective: Has no complaints this am. Denies cp, sob, abd pain or chills. Later nsg told me pt's hr up at 125bpm but asx.  Objective: Vitals:   05/05/20 2353 05/06/20 0719 05/06/20 1300 05/06/20 1353  BP: (!) 139/57 (!) 144/53 101/78   Pulse: 67 63 (!) 125 (!) 118    Resp: 20 18    Temp: 98.3 F (36.8 C) 98.3 F (36.8 C) 98.1 F (36.7 C)   TempSrc: Oral Oral Oral   SpO2: 99% 96% 96%   Weight:      Height:        Intake/Output Summary (Last 24 hours) at 05/06/2020 1506 Last data filed at 05/06/2020 1405 Gross per 24 hour  Intake 1480.17 ml  Output 2 ml  Net 1478.17 ml   Filed Weights   05/04/20 1814  Weight: 58.5 kg    Examination:  General exam: Appears calm and comfortable  Respiratory system: Clear to auscultation. Respiratory effort normal. Cardiovascular system: S1 & S2 heard, RRR. No murmurs, rubs, gallops or clicks. No pedal edema. Gastrointestinal system: Abdomen is nondistended, soft and nontender.  Normal bowel sounds heard. Central nervous system: Alert and oriented.  Extremities: no edema Skin: warm dry Psychiatry: Judgement and insight appear normal. Mood & affect appropriate in current setting.     Data Reviewed: I have personally reviewed following labs and imaging studies  CBC: Recent Labs  Lab 05/04/20 1823 05/05/20 0434 05/06/20 0458  WBC 13.3* 23.5* 11.2*  NEUTROABS  --   --  8.6*  HGB 12.0 10.2* 10.4*  HCT 37.6 30.4* 32.7*  MCV 95.4 91.8 95.6  PLT 328 250 811   Basic Metabolic Panel: Recent Labs  Lab 05/04/20 1823 05/05/20 0434 05/06/20 0458  NA 139 140 139  K 3.7 4.0 4.2  CL 103 108 109  CO2 25 25 23   GLUCOSE 113* 133* 82  BUN 16 18 16   CREATININE 1.23* 1.06* 0.91  CALCIUM 8.9 8.4* 8.4*  MG  --   --  1.9   GFR: Estimated Creatinine Clearance: 35.8 mL/min (by C-G formula based on SCr of 0.91 mg/dL). Liver Function Tests: No results for input(s): AST, ALT, ALKPHOS, BILITOT, PROT, ALBUMIN in the last 168 hours. No results for input(s): LIPASE, AMYLASE in the last 168 hours. No results for input(s): AMMONIA in the last 168 hours. Coagulation Profile: No results for input(s): INR, PROTIME in the last 168 hours. Cardiac Enzymes: No results for input(s): CKTOTAL, CKMB, CKMBINDEX, TROPONINI  in the last 168 hours. BNP (last 3 results) No results for input(s): PROBNP in the last 8760 hours. HbA1C: No results for input(s): HGBA1C in the last 72 hours. CBG: No results for input(s): GLUCAP in the last 168 hours. Lipid Profile: No results for input(s): CHOL, HDL, LDLCALC, TRIG, CHOLHDL, LDLDIRECT in the last 72 hours. Thyroid Function Tests: No results for input(s): TSH, T4TOTAL, FREET4, T3FREE, THYROIDAB in the last 72 hours. Anemia Panel: No results for input(s): VITAMINB12, FOLATE, FERRITIN, TIBC, IRON, RETICCTPCT in the last 72 hours. Sepsis Labs: Recent Labs  Lab 05/04/20 2309 05/05/20 0057  LATICACIDVEN 2.3* 2.2*    Recent Results (from the past 240 hour(s))  Urine Culture     Status: Abnormal (Preliminary result)   Collection Time: 05/04/20  9:54 PM   Specimen: Urine, Random  Result Value Ref Range Status   Specimen Description   Final    URINE, RANDOM Performed at [facility], [address], [place], [place] [address]    Special Requests   Final    NONE Performed at [facility], [address], [place], [place] [address]    Culture 20,000 COLONIES/mL ENTEROCOCCUS FAECALIS (A)  Final   Report Status PENDING  Incomplete  Culture, blood (routine x 2)     Status: None (Preliminary result)   Collection Time: 05/05/20 12:57 AM   Specimen: BLOOD LEFT HAND  Result Value Ref Range Status   Specimen Description BLOOD LEFT HAND  Final   Special Requests   Final    BOTTLES DRAWN AEROBIC AND ANAEROBIC Blood Culture results may not be optimal due to an excessive volume of blood received in culture bottles   Culture   Final    NO GROWTH 1 DAY Performed at [facility], [address], [place], [place] [address]    Report Status PENDING  Incomplete  Culture, blood (routine x 2)     Status: None (Preliminary result)   Collection Time: 05/05/20 12:57 AM   Specimen: Left Antecubital; Blood  Result Value Ref Range Status   Specimen  Description LEFT ANTECUBITAL  Final   Special Requests   Final    BOTTLES DRAWN AEROBIC AND ANAEROBIC Blood Culture results may not be optimal due to an excessive volume of blood received in culture bottles   Culture   Final    NO GROWTH 1 DAY Performed at [facility], [address] [place], [place] [address]    Report Status PENDING  Incomplete         Radiology Studies: DG Chest 2 View  Result Date: 05/04/2020 CLINICAL DATA:  Shortness of breath, vomiting and shortness of breath began after taking medication for UTI. EXAM: CHEST - 2 VIEW COMPARISON:  Radiograph 03/04/2020, CT 12/13/2018 FINDINGS: Chronically hyperinflated  lungs with coarsened interstitial changes and bronchitic features. Stable appearance of a bandlike opacity in the right mid lung corresponding to architectural distortion seen on comparison cross-sectional imaging. No new consolidative opacity or convincing features of acute pulmonary edema. No pneumothorax or effusion. The aorta is calcified. The remaining cardiomediastinal contours are unremarkable. No acute osseous or soft tissue abnormality. Degenerative changes are present in the imaged spine and shoulders. Chronic right breast calcifications are again seen. IMPRESSION: 1. No acute cardiopulmonary abnormality. 2. Chronically hyperinflated lungs with coarsened interstitial changes and bronchitic features. 3. Stable region of scarring in the right mid lung. Electronically Signed   By: Lovena Le M.D.   On: 05/04/2020 19:00        Scheduled Meds:  apixaban  2.5 mg Oral Q12H   [START ON 05/31/2020] cyanocobalamin  1,000 mcg Intramuscular Q28 days   escitalopram  10 mg Oral Daily   feeding supplement (ENSURE ENLIVE)  237 mL Oral BID BM   levothyroxine  50 mcg Oral QAC breakfast   pantoprazole  40 mg Oral Daily   Continuous Infusions:  sodium chloride 75 mL/hr at 05/06/20 0340   vancomycin      Assessment & Plan:   Active Problems:    Sepsis ([place])   Recurrent UTI   Acute pyelonephritis   Acute kidney injury superimposed on chronic kidney disease ([place])   #1.  Recurrent UTI with pyelonephritis and sepsis. Condition is stable today.   Ucx, 20,000 ent. Fac. Dc aztreonam Continue some IV fluids.  Pending culture results.  Continue antibiotics with aztreonam.   Urology was consulted per request of family we will also consult urology per request from family.  input was appreciated recommended:  Continue antibiotics for a total of 10 days of therapy.  Start a UTI prevention regimen:  ? Cranberry supplements twice daily, ideally on an empty stomach ? Probiotics daily, preferably containing lactobacillus ? Topical vaginal estrogen cream, apply a pea-sized amount at the urethral meatus three times weekly ? Antibiotic prophylaxis, nitrofurantoin 100mg  daily (start after completing treatment for her current infection)  Outpatient follow up with Dr. Matilde Sprang  Urine culture on 04/30/2020 in [facility] showed Enterococcus faecalis.  Susceptible to vancomycin. Vancomycin was added will continue for now  2.  Paroxysmal atrial fibrillation.with intermittent rvr On Eliquis. On cardizem at home? But bp too low to resume. Will give bolus of ivf, and start cardizem . Consulted cardiology  3.  Hypothyroidism.  on levo.  4.  Acute kidney injury on chronic kidney disease stage IIIa. Improved with ivf.  Continue to monitor   DVT prophylaxis: Eliquis Code Status: limited Family Communication: none at bedside Disposition Plan: back to home. Will get PT Status is: Inpatient  Remains inpatient appropriate because:IV treatments appropriate due to intensity of illness or inability to take PO   Dispo: The patient is from: Home              Anticipated d/c is to: Home              Anticipated d/c date is: 2 days              Patient currently is not medically stable to d/c.             LOS: 1 day   Time  spent: 35 min with >50% on coc    Nolberto Hanlon, MD [facility] Pager [phone]  If 7PM-7AM, please contact night-coverage [email or web address] Password [facility] 05/06/2020, 3:06 PM

## 2020-05-06 NOTE — Progress Notes (Signed)
   05/06/20 1300  Vitals  Temp 98.1 F (36.7 C)  Temp Source Oral  BP 101/78  MAP (mmHg) 87  BP Location Left Arm  BP Method Automatic  Patient Position (if appropriate) Lying  Pulse Rate (!) 125  Pulse Rate Source Monitor  Level of Consciousness  Level of Consciousness Alert  MEWS COLOR  MEWS Score Color Yellow  Oxygen Therapy  SpO2 96 %  O2 Device Room Air  Patient Activity (if Appropriate) In bed  Pulse Oximetry Type Continuous  Pain Assessment  Pain Scale 0-10  Pain Score 0  Faces Pain Scale 0  PAINAD (Pain Assessment in Advanced Dementia)  Breathing 0  Negative Vocalization 0  Facial Expression 0  Body Language 0  Consolability 0  PAINAD Score 0  Critical Care Pain Observation Tool (CPOT)  Facial Expression 0  Muscle Tension 0  MEWS Score  MEWS Temp 0  MEWS Systolic 0  MEWS Pulse 2  MEWS RR 0  MEWS LOC 0  MEWS Score 2  Provider Notification  Provider Name/Title Dr. Kurtis Bushman  Date Provider Notified 05/06/20  Time Provider Notified 1400  Notification Type Page  Notification Reason Other (Comment) (Elevated Heart rate/per minute, )  Response No new orders  Date of Provider Response 05/06/20  Time of Provider Response 1412     Pt with afib has been having some tachy rythm. between 125-140. BP:101/78, MAP:87, denies chest pain. SOB. MD Notified and is aware. Per MD, No order is received. MD has consulted cardiologist.

## 2020-05-07 DIAGNOSIS — N10 Acute tubulo-interstitial nephritis: Secondary | ICD-10-CM

## 2020-05-07 LAB — CBC
HCT: 33.7 % — ABNORMAL LOW (ref 36.0–46.0)
Hemoglobin: 11.1 g/dL — ABNORMAL LOW (ref 12.0–15.0)
MCH: 30.6 pg (ref 26.0–34.0)
MCHC: 32.9 g/dL (ref 30.0–36.0)
MCV: 92.8 fL (ref 80.0–100.0)
Platelets: 244 10*3/uL (ref 150–400)
RBC: 3.63 MIL/uL — ABNORMAL LOW (ref 3.87–5.11)
RDW: 14.6 % (ref 11.5–15.5)
WBC: 7.3 10*3/uL (ref 4.0–10.5)
nRBC: 0 % (ref 0.0–0.2)

## 2020-05-07 LAB — URINE CULTURE: Culture: 20000 — AB

## 2020-05-07 LAB — CREATININE, SERUM
Creatinine, Ser: 0.95 mg/dL (ref 0.44–1.00)
GFR calc Af Amer: >60 mL/min (ref >60)
GFR calc non Af Amer: 54 mL/min — ABNORMAL LOW (ref >60)

## 2020-05-07 MED ORDER — DILTIAZEM HCL ER COATED BEADS 120 MG PO CP24: 120.0000 mg | ORAL_CAPSULE | Freq: Every day | ORAL | 0 refills | Status: DC

## 2020-05-07 MED ORDER — RISAQUAD PO CAPS: 2.0000 | ORAL_CAPSULE | Freq: Three times a day (TID) | ORAL | 0 refills | Status: AC

## 2020-05-07 MED ORDER — PANTOPRAZOLE SODIUM 40 MG PO TBEC: 40.0000 mg | DELAYED_RELEASE_TABLET | Freq: Every day | ORAL | 0 refills | Status: DC

## 2020-05-07 MED ORDER — AMOXICILLIN 500 MG PO CAPS
500.0000 mg | ORAL_CAPSULE | Freq: Two times a day (BID) | ORAL | Status: DC
  Filled 2020-05-07: qty 1

## 2020-05-07 MED ORDER — AMOXICILLIN 500 MG PO CAPS
500.0000 mg | ORAL_CAPSULE | Freq: Three times a day (TID) | ORAL | Status: DC
  Filled 2020-05-07: qty 1

## 2020-05-07 MED ORDER — AMOXICILLIN 500 MG PO CAPS: 500.0000 mg | ORAL_CAPSULE | Freq: Three times a day (TID) | ORAL | 0 refills | Status: AC

## 2020-05-07 NOTE — Plan of Care (Signed)

## 2020-05-07 NOTE — Discharge Summary (Signed)
ADER FRITZE MGN:[id number] DOB: [birth year] DOA: 05/04/2020  PCP: Idelle Crouch, MD  Admit date: 05/04/2020 Discharge date: 05/07/2020  Admitted From: Home Disposition: Home  Recommendations for Outpatient Follow-up:  1. Follow up with PCP in 1 week 2. Please obtain BMP/CBC in one week Please follow up on the following pending results: Blood cultures Fu with urology as previously scheduled  Home Health: PT   Discharge Condition:Stable CODE STATUS: Limited Diet recommendation: Heart Healthy  Brief/Interim Summary: Skilar Marcou  is a 86 y.o. Caucasian female with a known history of hypothyroidism, lesion, dyslipidemia, GERD, TIA and recurrent UTI, presented to the emergency room with acute onset of recent UTI with associated urinary frequency.  Patient admitted to the hospital service.   #1. Recurrent UTI with pyelonephritis and sepsis.  Ucx, 20,000 Enterococcus faecalis She was initially started on aztreonam and switch to amoxicillin as she has been tolerating with no allergic reaction Blood cultures to date negative Urology was consulted per request of family we will also consult urology per request from family.  Urology recommended continuing antibiotics for total of 10 days Cranberry supplements twice a day Topical vaginal estrogen cream which she is on at home Probiotics Follow-up with Dr. Matilde Sprang    2. Paroxysmal atrial fibrillation.with intermittent rvr On Eliquis.  Cardiology consulted, Cardizem was resumed once her blood pressure was better  Heart rate better controlled  No further cardiac work-up  Continue on Eliquis  Follow-up cardiology as outpatient     3. Hypothyroidism.   On levothyroxine  4. Acute kidney injury on chronic kidney disease stage IIIa. Improved with ivf.     Discharge Diagnoses:  Active Problems:   Sepsis ([place])   Recurrent UTI   Acute pyelonephritis   Acute kidney injury superimposed on chronic kidney disease  [facility])    Discharge Instructions  Discharge Instructions    Call MD for:  temperature >100.4   Complete by: As directed    Diet - low sodium heart healthy   Complete by: As directed    Discharge instructions   Complete by: As directed    Follow-up with PCP in 1 week Follow-up with urology as previously scheduled Start taking cranberry supplements twice a day ideally on empty stomach   Increase activity slowly   Complete by: As directed      Allergies as of 05/07/2020      Reactions   Bactrim [sulfamethoxazole-trimethoprim] Hives   Iodinated Diagnostic Agents Anaphylaxis   Codeine Nausea And Vomiting   Erythromycin Ethylsuccinate Other (See Comments)   Unknown   Phenobarbital Other (See Comments)   Feeling funny, nervous   Ciprofloxacin Rash   Penicillin V Potassium Itching, Rash   Has patient had a PCN reaction causing immediate rash, facial/tongue/throat swelling, SOB or lightheadedness with hypotension: No Has patient had a PCN reaction causing severe rash involving mucus membranes or skin necrosis: No Has patient had a PCN reaction that required hospitalization: No Has patient had a PCN reaction occurring within the last 10 years: No If all of the above answers are "NO", then may proceed with Cephalosporin use.      Medication List    STOP taking these medications   diltiazem 120 MG 24 hr capsule Commonly known as: TIAZAC   diphenhydrAMINE 25 MG tablet Commonly known as: BENADRYL   nitrofurantoin (macrocrystal-monohydrate) 100 MG capsule Commonly known as: MACROBID   sodium chloride 0.65 % Soln nasal spray Commonly known as: OCEAN     TAKE these medications  acetaminophen 325 MG tablet Commonly known as: TYLENOL Take 650 mg by mouth every 6 (six) hours as needed for moderate pain.   acidophilus Caps capsule Take 2 capsules by mouth 3 (three) times daily.   amoxicillin 500 MG capsule Commonly known as: AMOXIL Take 1 capsule (500 mg total) by mouth  every 8 (eight) hours for 7 days.   apixaban 2.5 MG Tabs tablet Commonly known as: ELIQUIS Take 1 tablet (2.5 mg total) by mouth 2 (two) times daily. What changed: when to take this   cyanocobalamin 1000 MCG/ML injection Commonly known as: (VITAMIN B-12) Inject 1,000 mcg into the muscle every 28 (twenty-eight) days. Notes to patient: Not given during hospital stay    diltiazem 120 MG 24 hr capsule Commonly known as: CARDIZEM CD Take 1 capsule (120 mg total) by mouth daily.   escitalopram 10 MG tablet Commonly known as: LEXAPRO Take 10 mg by mouth daily.   feeding supplement (ENSURE ENLIVE) Liqd Take 237 mLs by mouth 2 (two) times daily between meals.   levothyroxine 50 MCG tablet Commonly known as: SYNTHROID Take 50 mcg by mouth daily before breakfast.   oxybutynin 5 MG tablet Commonly known as: DITROPAN Take 5 mg by mouth 2 (two) times daily. Notes to patient: Not given during hospital stay    pantoprazole 40 MG tablet Commonly known as: PROTONIX Take 1 tablet (40 mg total) by mouth daily. Start taking on: May 08, 2020   Premarin vaginal cream Generic drug: conjugated estrogens Place 0.5 g vaginally 2 (two) times a week.   PriLOSEC OTC 20 MG tablet Generic drug: omeprazole Take 20 mg by mouth daily.            Durable Medical Equipment  (From admission, onward)         Start     Ordered   05/07/20 1410  For home use only DME Shower stool  Once        05/07/20 1409          Follow-up Information    Schedule an appointment as soon as possible for a visit  with Idelle Crouch, MD.   Specialty: Internal Medicine Contact information: [address] [place] [address] [phone]        Go to  [employer].   Specialty: Emergency Medicine Why: If symptoms worsen Contact information: [place] [id number] [place] [id number] [phone]              Allergies  Allergen Reactions  . Bactrim [Sulfamethoxazole-Trimethoprim] Hives  . Iodinated Diagnostic Agents Anaphylaxis  . Codeine Nausea And Vomiting  . Erythromycin Ethylsuccinate Other (See Comments)    Unknown   . Phenobarbital Other (See Comments)    Feeling funny, nervous  . Ciprofloxacin Rash  . Penicillin V Potassium Itching and Rash    Has patient had a PCN reaction causing immediate rash, facial/tongue/throat swelling, SOB or lightheadedness with hypotension: No Has patient had a PCN reaction causing severe rash involving mucus membranes or skin necrosis: No Has patient had a PCN reaction that required hospitalization: No Has patient had a PCN reaction occurring within the last 10 years: No If all of the above answers are "NO", then may proceed with Cephalosporin use.     Consultations:  urology   Procedures/Studies: DG Chest 2 View  Result Date: 05/04/2020 CLINICAL DATA:  Shortness of breath, vomiting and shortness of breath began after taking medication for UTI.  EXAM: CHEST - 2 VIEW COMPARISON:  Radiograph 03/04/2020, CT 12/13/2018 FINDINGS: Chronically hyperinflated lungs with coarsened interstitial changes and bronchitic features. Stable appearance of a bandlike opacity in the right mid lung corresponding to architectural distortion seen on comparison cross-sectional imaging. No new consolidative opacity or convincing features of acute pulmonary edema. No pneumothorax or effusion. The aorta is calcified. The remaining cardiomediastinal contours are unremarkable. No acute osseous or soft tissue abnormality. Degenerative changes are present in the imaged spine and shoulders. Chronic right breast calcifications are again seen. IMPRESSION: 1. No acute cardiopulmonary abnormality. 2. Chronically hyperinflated lungs with coarsened interstitial changes and bronchitic features. 3. Stable region of scarring in the right mid lung. Electronically Signed   By: Lovena Le M.D.   On: 05/04/2020 19:00      Subjective: Patient has no complaints today.  Discharge Exam: Vitals:   05/07/20 0744 05/07/20 1059  BP: (!) 115/47 (!) 111/41  Pulse: (!) 58 57  Resp: 16 16  Temp: 97.6 F (36.4 C) 98.3 F (36.8 C)  SpO2: 94% 97%   Vitals:   05/07/20 0243 05/07/20 0645 05/07/20 0744 05/07/20 1059  BP: (!) 91/34 (!) 118/48 (!) 115/47 (!) 111/41  Pulse: 63 62 (!) 58 57  Resp: 14 16 16 16   Temp:  97.9 F (36.6 C) 97.6 F (36.4 C) 98.3 F (36.8 C)  TempSrc:  Oral Oral Oral  SpO2: 97% 98% 94% 97%  Weight:      Height:        General: Pt is alert, awake, not in acute distress Cardiovascular: RRR, S1/S2 +, no rubs, no gallops Respiratory: CTA bilaterally, no wheezing, no rhonchi Abdominal: Soft, NT, ND, bowel sounds + Extremities: no edema, no cyanosis    The results of significant diagnostics from this hospitalization (including imaging, microbiology, ancillary and laboratory) are listed below for reference.     Microbiology: Recent Results (from the past 240 hour(s))  Urine Culture     Status: Abnormal   Collection Time: 05/04/20  9:54 PM   Specimen: Urine, Random  Result Value Ref Range Status   Specimen Description   Final    URINE, RANDOM Performed at [facility], [address] [place], [place] [address]    Special Requests   Final    NONE Performed at [facility], [place], [place] [address]    Culture 20,000 COLONIES/mL ENTEROCOCCUS FAECALIS (A)  Final   Report Status 05/07/2020 FINAL  Final   Organism ID, Bacteria ENTEROCOCCUS FAECALIS (A)  Final      Susceptibility   Enterococcus faecalis - MIC*    AMPICILLIN <=2 SENSITIVE Sensitive     NITROFURANTOIN <=16 SENSITIVE Sensitive     VANCOMYCIN 1 SENSITIVE Sensitive     * 20,000 COLONIES/mL ENTEROCOCCUS FAECALIS  Culture, blood (routine x 2)     Status: None (Preliminary result)   Collection Time: 05/05/20 12:57 AM   Specimen: BLOOD LEFT  HAND  Result Value Ref Range Status   Specimen Description BLOOD LEFT HAND  Final   Special Requests   Final    BOTTLES DRAWN AEROBIC AND ANAEROBIC Blood Culture results may not be optimal due to an excessive volume of blood received in culture bottles   Culture   Final    NO GROWTH 2 DAYS Performed at [facility], [address] [place], [place] [address]    Report Status PENDING  Incomplete  Culture, blood (routine x 2)     Status:  None (Preliminary result)   Collection Time: 05/05/20 12:57 AM   Specimen: Left Antecubital; Blood  Result Value Ref Range Status   Specimen Description LEFT ANTECUBITAL  Final   Special Requests   Final    BOTTLES DRAWN AEROBIC AND ANAEROBIC Blood Culture results may not be optimal due to an excessive volume of blood received in culture bottles   Culture   Final    NO GROWTH 2 DAYS Performed at [facility], [address] [place], [place] [address]    Report Status PENDING  Incomplete     Labs: BNP (last 3 results) No results for input(s): BNP in the last 8760 hours. Basic Metabolic Panel: Recent Labs  Lab 05/04/20 1823 05/05/20 0434 05/06/20 0458 05/07/20 0503  NA 139 140 139  --   K 3.7 4.0 4.2  --   CL 103 108 109  --   CO2 25 25 23   --   GLUCOSE 113* 133* 82  --   BUN 16 18 16   --   CREATININE 1.23* 1.06* 0.91 0.95  CALCIUM 8.9 8.4* 8.4*  --   MG  --   --  1.9  --    Liver Function Tests: No results for input(s): AST, ALT, ALKPHOS, BILITOT, PROT, ALBUMIN in the last 168 hours. No results for input(s): LIPASE, AMYLASE in the last 168 hours. No results for input(s): AMMONIA in the last 168 hours. CBC: Recent Labs  Lab 05/04/20 1823 05/05/20 0434 05/06/20 0458 05/07/20 0950  WBC 13.3* 23.5* 11.2* 7.3  NEUTROABS  --   --  8.6*  --   HGB 12.0 10.2* 10.4* 11.1*  HCT 37.6 30.4* 32.7* 33.7*  MCV 95.4 91.8 95.6 92.8  PLT 328 250 235 244   Cardiac Enzymes: No results for input(s): CKTOTAL, CKMB, CKMBINDEX,  TROPONINI in the last 168 hours. BNP: Invalid input(s): POCBNP CBG: No results for input(s): GLUCAP in the last 168 hours. D-Dimer No results for input(s): DDIMER in the last 72 hours. Hgb A1c No results for input(s): HGBA1C in the last 72 hours. Lipid Profile No results for input(s): CHOL, HDL, LDLCALC, TRIG, CHOLHDL, LDLDIRECT in the last 72 hours. Thyroid function studies No results for input(s): TSH, T4TOTAL, T3FREE, THYROIDAB in the last 72 hours.  Invalid input(s): FREET3 Anemia work up No results for input(s): VITAMINB12, FOLATE, FERRITIN, TIBC, IRON, RETICCTPCT in the last 72 hours. Urinalysis    Component Value Date/Time   COLORURINE YELLOW (A) 05/04/2020 2154   APPEARANCEUR HAZY (A) 05/04/2020 2154   LABSPEC 1.012 05/04/2020 2154   PHURINE 5.0 05/04/2020 2154   GLUCOSEU NEGATIVE 05/04/2020 2154   HGBUR NEGATIVE 05/04/2020 2154   [place] NEGATIVE 05/04/2020 2154   [place] NEGATIVE 05/04/2020 2154   PROTEINUR NEGATIVE 05/04/2020 2154   NITRITE NEGATIVE 05/04/2020 2154   LEUKOCYTESUR SMALL (A) 05/04/2020 2154   Sepsis Labs Invalid input(s): PROCALCITONIN,  WBC,  LACTICIDVEN Microbiology Recent Results (from the past 240 hour(s))  Urine Culture     Status: Abnormal   Collection Time: 05/04/20  9:54 PM   Specimen: Urine, Random  Result Value Ref Range Status   Specimen Description   Final    URINE, RANDOM Performed at [facility], [address] [place], [place] [address]    Special Requests   Final    NONE Performed at [facility], [address], [place], [place] [address]    Culture 20,000 COLONIES/mL ENTEROCOCCUS FAECALIS (A)  Final   Report Status 05/07/2020 FINAL  Final   Organism ID, Bacteria ENTEROCOCCUS FAECALIS (A)  Final      Susceptibility   Enterococcus faecalis - MIC*    AMPICILLIN <=2 SENSITIVE Sensitive     NITROFURANTOIN <=16 SENSITIVE Sensitive     VANCOMYCIN 1 SENSITIVE Sensitive     * 20,000 COLONIES/mL  ENTEROCOCCUS FAECALIS  Culture, blood (routine x 2)     Status: None (Preliminary result)   Collection Time: 05/05/20 12:57 AM   Specimen: BLOOD LEFT HAND  Result Value Ref Range Status   Specimen Description BLOOD LEFT HAND  Final   Special Requests   Final    BOTTLES DRAWN AEROBIC AND ANAEROBIC Blood Culture results may not be optimal due to an excessive volume of blood received in culture bottles   Culture   Final    NO GROWTH 2 DAYS Performed at [facility], [address], [place], [place] [address]    Report Status PENDING  Incomplete  Culture, blood (routine x 2)     Status: None (Preliminary result)   Collection Time: 05/05/20 12:57 AM   Specimen: Left Antecubital; Blood  Result Value Ref Range Status   Specimen Description LEFT ANTECUBITAL  Final   Special Requests   Final    BOTTLES DRAWN AEROBIC AND ANAEROBIC Blood Culture results may not be optimal due to an excessive volume of blood received in culture bottles   Culture   Final    NO GROWTH 2 DAYS Performed at [facility], [address] [place], [place] [address]    Report Status PENDING  Incomplete     Time coordinating discharge: Over 30 minutes  SIGNED:   Nolberto Hanlon, MD  [facility] 05/07/2020, 2:24 PM Pager   If 7PM-7AM, please contact night-coverage [email or web address] Password [email or web address]

## 2020-05-07 NOTE — Progress Notes (Signed)
Patient discharged with discharge instructions, removed IV's from Left AC and Left hand without incident. Transported via 1a w/c to son's vehicle to go home .

## 2020-05-07 NOTE — Evaluation (Addendum)
Physical Therapy Evaluation Patient Details Name: Sheryl Michael MRN: [id number] DOB: 1931-05-05 Today's Date: 05/07/2020   History of Present Illness  Patient is am 36 year olf female with recurrent UTI with pyelonephritis and sepsis, Paroxysmal atrial fibrillation with RVR, acute kidney injury superimposed on chronic kidney disease , Acute pyelonephritis. Past medical history of hypothyroidism,  dyslipidemia, GERD, TIA and recurrent UTI.   Clinical Impression  PT evaluation completed. Patient was standing with staff assistance on arrival to room. Supportive family at bedside during evaluation. Patient ambulated 2 laps around nursing station with supervision using rolling walker. Patient was fatigued with activity and required two standing rest breaks to complete ambulation, Sp02 96% on room air and heart rate 67bpm after walking. Patient reports using a cane at home for mobility. Educated patient and son to use rolling walker at home for safety and fall prevention, progressing to cane as able with assistance from home health therapist. Educated patient on energy conservation techniques and fall prevention at home. Recommend to continue PT for functional deficits listed below in order to maximize function and return to PLOF. Recommend HHPT and intermittent family supervision at home.      Follow Up Recommendations Home health PT;Supervision - Intermittent    Equipment Recommendations   (shower seat )    Recommendations for Other Services       Precautions / Restrictions Precautions Precautions: Fall Restrictions Weight Bearing Restrictions: No      Mobility  Bed Mobility Overal bed mobility: Needs Assistance Bed Mobility: Sit to Supine       Sit to supine: Min assist   General bed mobility comments: intermittent assistance for RLE support   Transfers Overall transfer level: Needs assistance Equipment used: Rolling walker (2 wheeled) Transfers: Sit to/from Stand Sit to Stand:  Supervision            Ambulation/Gait Ambulation/Gait assistance: Supervision Gait Distance (Feet): 340 Feet Assistive device: Rolling walker (2 wheeled) Gait Pattern/deviations: Step-through pattern Gait velocity: decreased    General Gait Details: Patient ambulated 2 laps around nursing station with two short standing rest breaks. Cues for technique and safety using rolling walker. Encouraged patient to use rolling walker at home instead of cane initially for increased support and fall prevention. Sp02 96% on room air and heart rate 67bpm after walking   Stairs            Wheelchair Mobility    Modified Rankin (Stroke Patients Only)       Balance Overall balance assessment: Needs assistance Sitting-balance support: Feet supported Sitting balance-Leahy Scale: Normal Sitting balance - Comments: static and dynamic sitting    Standing balance support: Bilateral upper extremity supported Standing balance-Leahy Scale: Fair Standing balance comment: Patient using rolling walker for UE support                              Pertinent Vitals/Pain Pain Location: Vauge complaints of right flank pain after walking. Does not provide numerical value     Home Living Family/patient expects to be discharged to:: Private residence Living Arrangements: Alone Available Help at Discharge: Available PRN/intermittently;Family Type of Home: House Home Access: Ramped entrance     Home Layout: One level Home Equipment: [place] - 4 wheels;Walker - 2 wheels;Cane - single point      Prior Function Level of Independence: Independent with assistive device(s)         Comments: Patient uses cane for mobility. Independent  with ADLs      Hand Dominance        Extremity/Trunk Assessment   Upper Extremity Assessment Upper Extremity Assessment: Generalized weakness    Lower Extremity Assessment Lower Extremity Assessment: Generalized weakness (endurance impaired for  sustianed activity )       Communication   Communication: No difficulties  Cognition Arousal/Alertness: Awake/alert Behavior During Therapy: WFL for tasks assessed/performed Overall Cognitive Status: Within Functional Limits for tasks assessed                                        General Comments      Exercises     Assessment/Plan    PT Assessment Patient needs continued PT services  PT Problem List Decreased strength;Decreased activity tolerance;Decreased balance;Decreased mobility;Decreased safety awareness;Decreased knowledge of precautions;Cardiopulmonary status limiting activity;Decreased knowledge of use of DME       PT Treatment Interventions DME instruction;Gait training;Functional mobility training;Therapeutic activities;Therapeutic exercise;Balance training;Neuromuscular re-education;Patient/family education    PT Goals (Current goals can be found in the Care Plan section)  Acute Rehab PT Goals Patient Stated Goal: to go home  PT Goal Formulation: With patient/family Time For Goal Achievement: 05/21/20 Potential to Achieve Goals: Good    Frequency Min 2X/week   Barriers to discharge        Co-evaluation               AM-PAC PT "6 Clicks" Mobility  Outcome Measure Help needed turning from your back to your side while in a flat bed without using bedrails?: A Little Help needed moving from lying on your back to sitting on the side of a flat bed without using bedrails?: A Little Help needed moving to and from a bed to a chair (including a wheelchair)?: A Little Help needed standing up from a chair using your arms (e.g., wheelchair or bedside chair)?: A Little Help needed to walk in hospital room?: A Little Help needed climbing 3-5 steps with a railing? : A Little 6 Click Score: 18    End of Session Equipment Utilized During Treatment: Gait belt Activity Tolerance: Patient limited by fatigue Patient left: in bed;with call bell/phone  within reach;with family/visitor present;with bed alarm set Nurse Communication: Mobility status PT Visit Diagnosis: Muscle weakness (generalized) (M62.81);Unsteadiness on feet (R26.81)    Time: 2458-0998 PT Time Calculation (min) (ACUTE ONLY): 23 min   Charges:   PT Evaluation $PT Eval Moderate Complexity: 1 Mod PT Treatments $Therapeutic Activity: 8-22 mins        Minna Merritts, PT, MPT   Percell Locus 05/07/2020, 9:07 AM

## 2020-05-08 NOTE — Progress Notes (Signed)
Pharmacy - Antimicrobial Stewardship - PCN allergy assessment  Patient stated she has rash on palms, some itching 30 years ago.  No blisters or signs of anaphylaxis.  She agreed to amoxicillin oral challenge.  Amoxicillin 250mg  PO x 1 given to patient and tolerated.  Started on amoxicillin to treat possible UTI  Plan: 1. Tolerated amoxicillin, remove PCN from allergy list  Doreene Eland, PharmD, BCPS.   Work Cell: [phone] 05/08/2020 9:43 AM

## 2020-05-10 LAB — CULTURE, BLOOD (ROUTINE X 2)
Culture: NO GROWTH
Culture: NO GROWTH

## 2020-05-18 ENCOUNTER — Other Ambulatory Visit: Payer: Self-pay | Admitting: *Deleted

## 2020-05-18 ENCOUNTER — Ambulatory Visit: Payer: Medicare Other | Admitting: Urology

## 2020-05-18 ENCOUNTER — Other Ambulatory Visit: Payer: Self-pay

## 2020-05-18 ENCOUNTER — Encounter: Payer: Self-pay | Admitting: Urology

## 2020-05-18 VITALS — BP 128/62 | HR 65 | Ht 61.0 in

## 2020-05-18 DIAGNOSIS — N390 Urinary tract infection, site not specified: Secondary | ICD-10-CM

## 2020-05-18 LAB — URINALYSIS, COMPLETE
Bilirubin, UA: NEGATIVE
Glucose, UA: NEGATIVE
Ketones, UA: NEGATIVE
Leukocytes,UA: NEGATIVE
Nitrite, UA: NEGATIVE
Protein,UA: NEGATIVE
RBC, UA: NEGATIVE
Specific Gravity, UA: 1.02 (ref 1.005–1.030)
Urobilinogen, Ur: 0.2 mg/dL (ref 0.2–1.0)
pH, UA: 6.5 (ref 5.0–7.5)

## 2020-05-18 LAB — MICROSCOPIC EXAMINATION: Bacteria, UA: NONE SEEN

## 2020-05-18 MED ORDER — CEPHALEXIN 250 MG PO CAPS: 250.0000 mg | ORAL_CAPSULE | Freq: Two times a day (BID) | ORAL | 11 refills | Status: DC

## 2020-05-18 MED ORDER — CEPHALEXIN 250 MG PO CAPS: 250.0000 mg | ORAL_CAPSULE | Freq: Once | ORAL | 11 refills | Status: AC

## 2020-05-18 MED ORDER — PREMARIN 0.625 MG/GM VA CREA: TOPICAL_CREAM | VAGINAL | 11 refills | Status: DC

## 2020-05-18 NOTE — Progress Notes (Signed)
05/18/2020 9:19 AM   Sheryl Michael October 02, 1931 [id number]  Referring provider: Idelle Crouch, MD [facility] [place],  [place] [address]  Chief Complaint  Patient presents with  . Cysto    HPI: NP consult: Elderly patient with grade 3 cystocele who had an anterior repair in 2017 followed by [facility].  Has medical comorbidities on Eliquis.  Has had multiple positive urine cultures.  Recent CT scan normal kidneys.  Failed pessary fitting and vaginectomy was recommended with cardiac clearance.  Recently admitted for urosepsis.  Patient was concerned about daily prophylaxis and resistance.  Given estrogen cream at meatus to reduce infections.  Patient feels vaginal bulging.  She sometimes reduces it.  She has had a hysterectomy.  Some of the details of the history were sometimes challenging  She leaks with coughing sneezing but not bending lifting.  She can have urge incontinence.  She wears 1 depends a day.  No bedwetting.  Voids twice a night and every 2-3 hours during the day  It appears that she has had hospitalization with sepsis 3 times in the last year with very little symptoms preceding this.   PMH: Past Medical History:  Diagnosis Date  . Arthritis   . Atrial fibrillation ([place])   . Breast cancer ([place]) 2006   right breast ca with lumpectomy and rad tx  . Colon polyp   . Cystocele   . Depression   . Female bladder prolapse   . GERD (gastroesophageal reflux disease)   . Goiter   . Hyperlipemia   . Hypothyroidism   . Osteoporosis   . Personal history of radiation therapy 2006   right breast ca  . Pneumonia   . Procidentia of uterus   . Recurrent UTI   . Reflux   . TIA (transient ischemic attack)   . Vaginal atrophy     Surgical History: Past Surgical History:  Procedure Laterality Date  . APPENDECTOMY    . BREAST BIOPSY Right 2006   breast cancer  . BREAST LUMPECTOMY Right 2006   positive  . BREAST SURGERY Right    lumpectomy  .  CATARACT EXTRACTION W/ INTRAOCULAR LENS  IMPLANT, BILATERAL    . COLONOSCOPY    . CYSTOCELE REPAIR N/A 12/21/2015   Procedure: ANTERIOR REPAIR (CYSTOCELE);  Surgeon: Brayton Mars, MD;  Location: [facility] ORS;  Service: Gynecology;  Laterality: N/A;  . DILATION AND CURETTAGE OF UTERUS    . ELECTROMAGNETIC NAVIGATION BROCHOSCOPY Right 12/13/2018   Procedure: ELECTROMAGNETIC NAVIGATION BRONCHOSCOPY;  Surgeon: Flora Lipps, MD;  Location: [facility] ORS;  Service: Cardiopulmonary;  Laterality: Right;  . EYE SURGERY    . VAGINAL HYSTERECTOMY Bilateral 12/21/2015   Procedure: TVH BSO;  Surgeon: Brayton Mars, MD;  Location: [facility] ORS;  Service: Gynecology;  Laterality: Bilateral;    Home Medications:  Allergies as of 05/18/2020      Reactions   Bactrim [sulfamethoxazole-trimethoprim] Hives   Iodinated Diagnostic Agents Anaphylaxis   Codeine Nausea And Vomiting   Erythromycin Ethylsuccinate Other (See Comments)   Unknown   Phenobarbital Other (See Comments)   Feeling funny, nervous   Ciprofloxacin Rash      Medication List       Accurate as of May 18, 2020  9:19 AM. If you have any questions, ask your nurse or doctor.        acetaminophen 325 MG tablet Commonly known as: TYLENOL Take 650 mg by mouth every 6 (six) hours as needed for  moderate pain.   acidophilus Caps capsule Take 2 capsules by mouth 3 (three) times daily.   apixaban 2.5 MG Tabs tablet Commonly known as: ELIQUIS Take 1 tablet (2.5 mg total) by mouth 2 (two) times daily. What changed: when to take this   cyanocobalamin 1000 MCG/ML injection Commonly known as: (VITAMIN B-12) Inject 1,000 mcg into the muscle every 28 (twenty-eight) days.   diltiazem 120 MG 24 hr capsule Commonly known as: CARDIZEM CD Take 1 capsule (120 mg total) by mouth daily.   escitalopram 10 MG tablet Commonly known as: LEXAPRO Take 10 mg by mouth daily.   feeding supplement (ENSURE ENLIVE) Liqd Take 237 mLs by mouth 2 (two) times  daily between meals.   levothyroxine 50 MCG tablet Commonly known as: SYNTHROID Take 50 mcg by mouth daily before breakfast.   pantoprazole 40 MG tablet Commonly known as: PROTONIX Take 1 tablet (40 mg total) by mouth daily.   Premarin vaginal cream Generic drug: conjugated estrogens Place 0.5 g vaginally 2 (two) times a week.   PriLOSEC OTC 20 MG tablet Generic drug: omeprazole Take 20 mg by mouth daily.       Allergies:  Allergies  Allergen Reactions  . Bactrim [Sulfamethoxazole-Trimethoprim] Hives  . Iodinated Diagnostic Agents Anaphylaxis  . Codeine Nausea And Vomiting  . Erythromycin Ethylsuccinate Other (See Comments)    Unknown   . Phenobarbital Other (See Comments)    Feeling funny, nervous  . Ciprofloxacin Rash    Family History: Family History  Problem Relation Age of Onset  . Diabetes Sister   . Breast cancer Sister        late 35's  . Diabetes Brother   . Diabetes Sister     Social History:  reports that she has never smoked. She has never used smokeless tobacco. She reports that she does not drink alcohol and does not use drugs.  ROS:                                        Physical Exam: BP (!) 128/62   Pulse 65   Ht 5\' 1"  (1.549 m)   BMI 24.37 kg/m   Constitutional:  Alert and oriented, No acute distress. HEENT: [place] AT, moist mucus membranes.  Trachea midline, no masses. Cardiovascular: No clubbing, cyanosis, or edema. Respiratory: Normal respiratory effort, no increased work of breathing. GI: Abdomen is soft, nontender, nondistended, no abdominal masses GU: No CVA tenderness.  On pelvic examination patient had significant vault prolapse.  At full prolapse she lost the majority of her anterior and posterior length.  With the posterior wall reduced and rectocele reduced she had minimal cystocele.  With the anterior wall reduced she had more prolapse posteriorly.  She had moderate atrophy.  Her tissues were a bit tender.   She might have an enterocele. Skin: No rashes, bruises or suspicious lesions. Lymph: No cervical or inguinal adenopathy. Neurologic: Grossly intact, no focal deficits, moving all 4 extremities. Psychiatric: Normal mood and affect.  Laboratory Data: Lab Results  Component Value Date   WBC 7.3 05/07/2020   HGB 11.1 (L) 05/07/2020   HCT 33.7 (L) 05/07/2020   MCV 92.8 05/07/2020   PLT 244 05/07/2020    Lab Results  Component Value Date   CREATININE 0.95 05/07/2020    No results found for: PSA  No results found for: TESTOSTERONE  Lab Results  Component Value Date  HGBA1C 5.5 04/15/2013    Urinalysis    Component Value Date/Time   COLORURINE YELLOW (A) 05/04/2020 2154   APPEARANCEUR HAZY (A) 05/04/2020 2154   LABSPEC 1.012 05/04/2020 2154   PHURINE 5.0 05/04/2020 2154   GLUCOSEU NEGATIVE 05/04/2020 2154   HGBUR NEGATIVE 05/04/2020 2154   [place] NEGATIVE 05/04/2020 2154   [place] NEGATIVE 05/04/2020 2154   PROTEINUR NEGATIVE 05/04/2020 2154   NITRITE NEGATIVE 05/04/2020 2154   LEUKOCYTESUR SMALL (A) 05/04/2020 2154    Pertinent Imaging:   Assessment & Plan: Patient has prolapse symptoms.  She has had 3 bladder infections with admission to the hospital.  Patient was drawn.  If the patient decides to proceed with surgery I recommend that the vaginectomy at [facility] was actually a good choice to shorten the time of her surgery with her medical comorbidities.  She understands prolapse is not related to her urinary tract infections.  Role of prophylaxis discussed.  Role of estrogen cream and probiotics discussed  Nausea with Macrodantin I believe has a sulfa allergy.  Reassess in 8 weeks on daily Keflex.  She has been having more trouble with atrial fibrillation and I think we all agree watchful waiting for prolapse is her final decision  1. Recurrent UTI  - Urinalysis, Complete   No follow-ups on file.  Reece Packer, MD  [facility] [address] [place], [place] [address] [phone]

## 2020-05-21 LAB — CULTURE, URINE COMPREHENSIVE

## 2020-05-25 ENCOUNTER — Ambulatory Visit: Payer: Medicare Other | Admitting: Urology

## 2020-05-25 ENCOUNTER — Telehealth: Payer: Self-pay

## 2020-05-25 MED ORDER — FOSFOMYCIN TROMETHAMINE 3 G PO PACK: 3.0000 g | PACK | Freq: Once | ORAL | 0 refills | Status: AC

## 2020-05-25 NOTE — Telephone Encounter (Signed)
-----   Message from Bjorn Loser, MD sent at 05/25/2020  2:03 PM EDT ----- Monurol  3 gram sachet x1 Then go back on daily keflex           ----- Message ----- From: Royanne Foots, [place]: 05/22/2020   8:18 AM EDT To: Bjorn Loser, MD   ----- Message ----- From: Interface, [facility] Lab Results In Sent: 05/18/2020   4:37 PM EDT To: [facility] Clinical

## 2020-05-25 NOTE — Telephone Encounter (Signed)
Patient notified and script sent and mychart sent

## 2020-05-26 ENCOUNTER — Telehealth: Payer: Self-pay

## 2020-05-26 NOTE — Telephone Encounter (Signed)
sw [employer] PA approved , SW [facility] let them know PA was approved they will contact patient.

## 2020-06-12 ENCOUNTER — Other Ambulatory Visit: Payer: Self-pay

## 2020-06-12 ENCOUNTER — Ambulatory Visit (INDEPENDENT_AMBULATORY_CARE_PROVIDER_SITE_OTHER): Payer: Medicare Other | Admitting: Physician Assistant

## 2020-06-12 ENCOUNTER — Encounter: Payer: Self-pay | Admitting: Physician Assistant

## 2020-06-12 VITALS — BP 175/67 | HR 68 | Temp 97.7°F | Ht 61.0 in | Wt 125.4 lb

## 2020-06-12 DIAGNOSIS — R350 Frequency of micturition: Secondary | ICD-10-CM | POA: Diagnosis not present

## 2020-06-12 DIAGNOSIS — N390 Urinary tract infection, site not specified: Secondary | ICD-10-CM | POA: Diagnosis not present

## 2020-06-12 LAB — URINALYSIS, COMPLETE
Bilirubin, UA: NEGATIVE
Glucose, UA: NEGATIVE
Ketones, UA: NEGATIVE
Nitrite, UA: NEGATIVE
Protein,UA: NEGATIVE
RBC, UA: NEGATIVE
Specific Gravity, UA: 1.015 (ref 1.005–1.030)
Urobilinogen, Ur: 0.2 mg/dL (ref 0.2–1.0)
pH, UA: 7 (ref 5.0–7.5)

## 2020-06-12 LAB — MICROSCOPIC EXAMINATION
Bacteria, UA: NONE SEEN
RBC, Urine: NONE SEEN /hpf (ref 0–2)

## 2020-06-12 LAB — BLADDER SCAN AMB NON-IMAGING

## 2020-06-12 MED ORDER — FOSFOMYCIN TROMETHAMINE 3 G PO PACK: 3.0000 g | PACK | Freq: Once | ORAL | 0 refills | Status: AC

## 2020-06-12 NOTE — Progress Notes (Signed)
06/12/2020 11:16 AM   Sheryl Michael June 16, 1931 [id number]  CC: Chief Complaint  Patient presents with  . Recurrent UTI    HPI: Sheryl Michael is a 86 y.o. female with PMH A. fib on Eliquis, recurrent stage III cystocele managed by [facility], breast cancer in 2006 s/p lumpectomy and radiation therapy, recurrent UTI with 2 hospitalizations with urosepsis this year, atrophic vaginitis, and TIA who presents today for evaluation of possible UTI.  She was seen in clinic by Dr. Matilde Sprang on 05/18/2020 and started on Keflex 250 mg daily prophylaxis at that time.  CT stone study dated 03/05/2020 with no urolithiasis.  Urine grossly infected on UA prior to her May 2021 admission with urosepsis.  UA notable only for pyuria prior to her July 2021 admission with urosepsis.  Today she reports an approximate 1-week history of frequency, urgency, occasional dysuria, and urinary leakage.  She states it is uncomfortable to sit down. She reports urgency and frequency at baseline and it is unclear if this represents worsening of her typical symptoms. She has not been using topical vaginal estrogen cream.   She lives alone and administers her own medications. She is accompanied today by her son, which reports concerns that she may not be taking these as prescribed.  In-office UA today positive for trace leukocyte esterase; urine microscopy with 11-30 WBCs/HPF. PVR 181mL.  PMH: Past Medical History:  Diagnosis Date  . Arthritis   . Atrial fibrillation ([place])   . Breast cancer ([place]) 2006   right breast ca with lumpectomy and rad tx  . Colon polyp   . Cystocele   . Depression   . Female bladder prolapse   . GERD (gastroesophageal reflux disease)   . Goiter   . Hyperlipemia   . Hypothyroidism   . Osteoporosis   . Personal history of radiation therapy 2006   right breast ca  . Pneumonia   . Procidentia of uterus   . Recurrent UTI   . Reflux   . TIA (transient ischemic attack)   . Vaginal  atrophy     Surgical History: Past Surgical History:  Procedure Laterality Date  . APPENDECTOMY    . BREAST BIOPSY Right 2006   breast cancer  . BREAST LUMPECTOMY Right 2006   positive  . BREAST SURGERY Right    lumpectomy  . CATARACT EXTRACTION W/ INTRAOCULAR LENS  IMPLANT, BILATERAL    . COLONOSCOPY    . CYSTOCELE REPAIR N/A 12/21/2015   Procedure: ANTERIOR REPAIR (CYSTOCELE);  Surgeon: Brayton Mars, MD;  Location: [facility] ORS;  Service: Gynecology;  Laterality: N/A;  . DILATION AND CURETTAGE OF UTERUS    . ELECTROMAGNETIC NAVIGATION BROCHOSCOPY Right 12/13/2018   Procedure: ELECTROMAGNETIC NAVIGATION BRONCHOSCOPY;  Surgeon: Flora Lipps, MD;  Location: [facility] ORS;  Service: Cardiopulmonary;  Laterality: Right;  . EYE SURGERY    . VAGINAL HYSTERECTOMY Bilateral 12/21/2015   Procedure: TVH BSO;  Surgeon: Brayton Mars, MD;  Location: [facility] ORS;  Service: Gynecology;  Laterality: Bilateral;    Home Medications:  Allergies as of 06/12/2020      Reactions   Bactrim [sulfamethoxazole-trimethoprim] Hives   Iodinated Diagnostic Agents Anaphylaxis   Codeine Nausea And Vomiting   Erythromycin Ethylsuccinate Other (See Comments)   Unknown   Phenobarbital Other (See Comments)   Feeling funny, nervous   Ciprofloxacin Rash      Medication List       Accurate as of June 12, 2020 11:16 AM. If you have any  questions, ask your nurse or doctor.        acetaminophen 325 MG tablet Commonly known as: TYLENOL Take 650 mg by mouth every 6 (six) hours as needed for moderate pain.   amiodarone 200 MG tablet Commonly known as: PACERONE Take 200 mg by mouth daily.   apixaban 2.5 MG Tabs tablet Commonly known as: ELIQUIS Take 1 tablet (2.5 mg total) by mouth 2 (two) times daily. What changed: when to take this   atorvastatin 10 MG tablet Commonly known as: LIPITOR Take 1 tablet by mouth at bedtime.   Calcium Carbonate-Vitamin D 600-200 MG-UNIT Tabs Take 1 tablet by mouth  daily as needed.   cephALEXin 250 MG capsule Commonly known as: KEFLEX Take 250 mg by mouth daily.   cyanocobalamin 1000 MCG/ML injection Commonly known as: (VITAMIN B-12) Inject 1,000 mcg into the muscle every 28 (twenty-eight) days.   diltiazem 120 MG 24 hr capsule Commonly known as: TIAZAC Take by mouth.   diltiazem 120 MG 24 hr capsule Commonly known as: CARDIZEM CD Take 1 capsule (120 mg total) by mouth daily.   escitalopram 10 MG tablet Commonly known as: LEXAPRO Take 10 mg by mouth daily.   feeding supplement (ENSURE ENLIVE) Liqd Take 237 mLs by mouth 2 (two) times daily between meals.   fosfomycin 3 g Pack Commonly known as: MONUROL Take 3 g by mouth once for 1 dose. Started by: Debroah Loop, PA-C   levothyroxine 50 MCG tablet Commonly known as: SYNTHROID Take 50 mcg by mouth daily before breakfast.   omeprazole 20 MG capsule Commonly known as: PRILOSEC Take 20 mg by mouth daily.   pantoprazole 40 MG tablet Commonly known as: PROTONIX Take 1 tablet (40 mg total) by mouth daily.   Premarin vaginal cream Generic drug: conjugated estrogens Dispose of applicator. Apply pea size amount to vaginal area Monday, Wednesday and Friday night.   PriLOSEC OTC 20 MG tablet Generic drug: omeprazole Take 20 mg by mouth daily.       Allergies:  Allergies  Allergen Reactions  . Bactrim [Sulfamethoxazole-Trimethoprim] Hives  . Iodinated Diagnostic Agents Anaphylaxis  . Codeine Nausea And Vomiting  . Erythromycin Ethylsuccinate Other (See Comments)    Unknown   . Phenobarbital Other (See Comments)    Feeling funny, nervous  . Ciprofloxacin Rash    Family History: Family History  Problem Relation Age of Onset  . Diabetes Sister   . Breast cancer Sister        late 90's  . Diabetes Brother   . Diabetes Sister     Social History:   reports that she has never smoked. She has never used smokeless tobacco. She reports that she does not drink  alcohol and does not use drugs.  Physical Exam: BP (!) 175/67 (BP Location: Left Arm, Patient Position: Sitting, Cuff Size: Normal)   Pulse 68   Temp 97.7 F (36.5 C) (Oral)   Ht 5\' 1"  (1.549 m)   Wt 125 lb 6.4 oz (56.9 kg)   SpO2 98%   BMI 23.69 kg/m   Constitutional:  Awake, no acute distress, nontoxic appearing HEENT: [place], AT Cardiovascular: No clubbing, cyanosis, or edema Respiratory: Normal respiratory effort, no increased work of breathing Skin: No rashes, bruises or suspicious lesions Neurologic: Grossly intact, no focal deficits, moving all 4 extremities Psychiatric: Normal mood and affect, appears confused  Laboratory Data: Results for orders placed or performed in visit on 06/12/20  CULTURE, URINE COMPREHENSIVE   Specimen: Urine   UR  Result Value Ref Range   Urine Culture, Comprehensive Preliminary report    Organism ID, Bacteria Comment   Microscopic Examination   Urine  Result Value Ref Range   WBC, UA 11-30 (A) 0 - 5 /hpf   RBC None seen 0 - 2 /hpf   Epithelial Cells (non renal) 0-10 0 - 10 /hpf   Bacteria, UA None seen None seen/Few  Urinalysis, Complete  Result Value Ref Range   Specific Gravity, UA 1.015 1.005 - 1.030   pH, UA 7.0 5.0 - 7.5   Color, UA Yellow Yellow   Appearance Ur Hazy (A) Clear   Leukocytes,UA Trace (A) Negative   Protein,UA Negative Negative/Trace   Glucose, UA Negative Negative   Ketones, UA Negative Negative   RBC, UA Negative Negative   Bilirubin, UA Negative Negative   Urobilinogen, Ur 0.2 0.2 - 1.0 mg/dL   Nitrite, UA Negative Negative   Microscopic Examination See below:   Bladder Scan (Post Void Residual) in office  Result Value Ref Range   Scan Result 162mL    Assessment & Plan:   1. Urinary frequency 86 year old female with PMH recurrent UTI and urosepsis, grade 3 cystocele, and atrophic vaginitis presents with an approximate 1 week history of urgency, frequency, occasional dysuria, and urge incontinence.  She is a  poor historian and it is unclear if this is typical for her at baseline or represents acute worsening of her symptoms.  She is afebrile today, VSS.  UA notable for pyuria.  Given isolated pyuria on UA prior to her most recent hospitalization with urosepsis, I will treat her empirically with fosfomycin x1 dose for possible UTI.  That said, I believe she has an element of overactive bladder that may be mimicking infective symptoms.  I recommend consideration of OAB therapies upon reassessment of her symptoms and follow-up with Dr. Matilde Sprang in approximately 1 month.  I encouraged her to start previously prescribed topical vaginal estrogen cream and explained that this is critically important in reducing her frequency of urinary tract infection.  She expressed understanding. - Urinalysis, Complete - Bladder Scan (Post Void Residual) in office - fosfomycin (MONUROL) 3 g PACK; Take 3 g by mouth once for 1 dose.  Dispense: 3 g; Refill: 0 - CULTURE, URINE COMPREHENSIVE   Return if symptoms worsen or fail to improve.  Debroah Loop, PA-C  [facility] [address] [place], [place] [address] [phone]

## 2020-06-12 NOTE — Patient Instructions (Addendum)
1. I have prescribed an antibiotic today for a possible UTI. Please take this today. If you have not yet taken your daily Keflex today, you may skip this medication today and restart it tomorrow. 2. Start topical vaginal estrogen cream three times weekly. Apply one pea-sized amount around the opening of your urethra, where your urine comes out.

## 2020-06-17 LAB — CULTURE, URINE COMPREHENSIVE

## 2020-06-30 ENCOUNTER — Other Ambulatory Visit: Payer: Self-pay | Admitting: Internal Medicine

## 2020-06-30 DIAGNOSIS — Z1231 Encounter for screening mammogram for malignant neoplasm of breast: Secondary | ICD-10-CM

## 2020-07-02 ENCOUNTER — Ambulatory Visit
Admission: RE | Admit: 2020-07-02 | Discharge: 2020-07-02 | Disposition: A | Discharge status: Home / Self Care | Payer: Medicare Other | Source: Ambulatory Visit | Attending: Internal Medicine | Admitting: Internal Medicine

## 2020-07-02 ENCOUNTER — Other Ambulatory Visit: Payer: Self-pay

## 2020-07-02 DIAGNOSIS — Z1231 Encounter for screening mammogram for malignant neoplasm of breast: Secondary | ICD-10-CM | POA: Insufficient documentation

## 2020-07-09 ENCOUNTER — Other Ambulatory Visit: Payer: Self-pay | Admitting: Internal Medicine

## 2020-07-09 DIAGNOSIS — R928 Other abnormal and inconclusive findings on diagnostic imaging of breast: Secondary | ICD-10-CM

## 2020-07-09 DIAGNOSIS — N6320 Unspecified lump in the left breast, unspecified quadrant: Secondary | ICD-10-CM

## 2020-07-13 ENCOUNTER — Ambulatory Visit: Payer: Medicare Other | Admitting: Urology

## 2020-07-13 ENCOUNTER — Other Ambulatory Visit: Payer: Self-pay

## 2020-07-13 ENCOUNTER — Encounter: Payer: Self-pay | Admitting: Urology

## 2020-07-13 VITALS — BP 170/53 | HR 62 | Ht 61.0 in | Wt 127.6 lb

## 2020-07-13 DIAGNOSIS — N3020 Other chronic cystitis without hematuria: Secondary | ICD-10-CM

## 2020-07-13 DIAGNOSIS — N390 Urinary tract infection, site not specified: Secondary | ICD-10-CM

## 2020-07-13 LAB — URINALYSIS, COMPLETE
Bilirubin, UA: NEGATIVE
Glucose, UA: NEGATIVE
Ketones, UA: NEGATIVE
Leukocytes,UA: NEGATIVE
Nitrite, UA: NEGATIVE
Protein,UA: NEGATIVE
RBC, UA: NEGATIVE
Specific Gravity, UA: 1.02 (ref 1.005–1.030)
Urobilinogen, Ur: 0.2 mg/dL (ref 0.2–1.0)
pH, UA: 6.5 (ref 5.0–7.5)

## 2020-07-13 LAB — MICROSCOPIC EXAMINATION

## 2020-07-13 MED ORDER — CEPHALEXIN 250 MG PO CAPS: 250.0000 mg | ORAL_CAPSULE | Freq: Every day | ORAL | 3 refills | Status: DC

## 2020-07-13 NOTE — Addendum Note (Signed)
Addended by: Alvera Novel on: 07/13/2020 09:55 AM   Modules accepted: Orders

## 2020-07-13 NOTE — Progress Notes (Signed)
07/13/2020 9:42 AM   Sheryl Michael Mar 08, 1931 [id number]  Referring provider: Idelle Crouch, MD [facility] [place],  [place] [address]  Chief Complaint  Patient presents with  . Follow-up    HPI: NP consult: Elderly patient with grade 3 cystocele who had an anterior repair in 2017 followed by [facility].  Has medical comorbidities on Eliquis.  Has had multiple positive urine cultures.  Recent CT scan normal kidneys.  Failed pessary fitting and vaginectomy was recommended with cardiac clearance.  Recently admitted for urosepsis.  Patient was concerned about daily prophylaxis and resistance.  Given estrogen cream at meatus to reduce infections.  Patient feels vaginal bulging.  She sometimes reduces it.  She has had a hysterectomy.  Some of the details of the history were sometimes challenging  She leaks with coughing sneezing but not bending lifting.  She can have urge incontinence.  She wears 1 depends a day.  No bedwetting.  Voids twice a night and every 2-3 hours during the day  It appears that she has had hospitalization with sepsis 3 times in the last year with very little symptoms preceding this.  Patient has prolapse symptoms.  She has had 3 bladder infections with admission to the hospital.  Patient was drawn.  If the patient decides to proceed with surgery I recommend that the vaginectomy at [facility] was actually a good choice to shorten the time of her surgery with her medical comorbidities.  She understands prolapse is not related to her urinary tract infections.  Role of prophylaxis discussed.  Role of estrogen cream and probiotics discussed  Nausea with Macrodantin I believe has a sulfa allergy.  Reassess in 8 weeks on daily Keflex.  She has been having more trouble with atrial fibrillation and I think we all agree watchful waiting for prolapse is her final decision  Today On August 27 patient saw a nurse practitioner with 1 week history of  occasional dysuria and frequency and was not using estrogen cream.  CT scan Mar 05, 2020 normal when she was in the hospital last time.  Residual 110 mL.  Poor historian.  Was given fosfomycin and encouraged to use estrogen.  Culture was positive on that day  Clinically doing well.  Minimal incontinence.  Clinically infection free.  Using estrogen on daily Keflex    PMH: Past Medical History:  Diagnosis Date  . Arthritis   . Atrial fibrillation ([place])   . Breast cancer ([place]) 2006   right breast ca with lumpectomy and rad tx  . Colon polyp   . Cystocele   . Depression   . Female bladder prolapse   . GERD (gastroesophageal reflux disease)   . Goiter   . Hyperlipemia   . Hypothyroidism   . Osteoporosis   . Personal history of radiation therapy 2006   right breast ca  . Pneumonia   . Procidentia of uterus   . Recurrent UTI   . Reflux   . TIA (transient ischemic attack)   . Vaginal atrophy     Surgical History: Past Surgical History:  Procedure Laterality Date  . APPENDECTOMY    . BREAST BIOPSY Right 2006   breast cancer  . BREAST LUMPECTOMY Right 2006   positive  . BREAST SURGERY Right    lumpectomy  . CATARACT EXTRACTION W/ INTRAOCULAR LENS  IMPLANT, BILATERAL    . COLONOSCOPY    . CYSTOCELE REPAIR N/A 12/21/2015   Procedure: ANTERIOR REPAIR (CYSTOCELE);  Surgeon:  Brayton Mars, MD;  Location: [facility] ORS;  Service: Gynecology;  Laterality: N/A;  . DILATION AND CURETTAGE OF UTERUS    . ELECTROMAGNETIC NAVIGATION BROCHOSCOPY Right 12/13/2018   Procedure: ELECTROMAGNETIC NAVIGATION BRONCHOSCOPY;  Surgeon: Flora Lipps, MD;  Location: [facility] ORS;  Service: Cardiopulmonary;  Laterality: Right;  . EYE SURGERY    . VAGINAL HYSTERECTOMY Bilateral 12/21/2015   Procedure: TVH BSO;  Surgeon: Brayton Mars, MD;  Location: [facility] ORS;  Service: Gynecology;  Laterality: Bilateral;    Home Medications:  Allergies as of 07/13/2020      Reactions   Bactrim  [sulfamethoxazole-trimethoprim] Hives   Iodinated Diagnostic Agents Anaphylaxis   Codeine Nausea And Vomiting   Erythromycin Ethylsuccinate Other (See Comments)   Unknown   Phenobarbital Other (See Comments)   Feeling funny, nervous   Ciprofloxacin Rash      Medication List       Accurate as of July 13, 2020  9:42 AM. If you have any questions, ask your nurse or doctor.        acetaminophen 325 MG tablet Commonly known as: TYLENOL Take 650 mg by mouth every 6 (six) hours as needed for moderate pain.   amiodarone 200 MG tablet Commonly known as: PACERONE Take 200 mg by mouth daily.   apixaban 2.5 MG Tabs tablet Commonly known as: ELIQUIS Take 1 tablet (2.5 mg total) by mouth 2 (two) times daily. What changed: when to take this   atorvastatin 10 MG tablet Commonly known as: LIPITOR Take 1 tablet by mouth at bedtime.   Calcium Carbonate-Vitamin D 600-200 MG-UNIT Tabs Take 1 tablet by mouth daily as needed.   cephALEXin 250 MG capsule Commonly known as: KEFLEX Take 250 mg by mouth daily.   cyanocobalamin 1000 MCG/ML injection Commonly known as: (VITAMIN B-12) Inject 1,000 mcg into the muscle every 28 (twenty-eight) days.   diltiazem 120 MG 24 hr capsule Commonly known as: TIAZAC Take by mouth.   diltiazem 120 MG 24 hr capsule Commonly known as: CARDIZEM CD Take 1 capsule (120 mg total) by mouth daily.   escitalopram 10 MG tablet Commonly known as: LEXAPRO Take 10 mg by mouth daily.   feeding supplement (ENSURE ENLIVE) Liqd Take 237 mLs by mouth 2 (two) times daily between meals.   fosfomycin 3 g Pack Commonly known as: MONUROL TAKE AS SINGLE DOSE   levothyroxine 50 MCG tablet Commonly known as: SYNTHROID Take 50 mcg by mouth daily before breakfast.   omeprazole 20 MG capsule Commonly known as: PRILOSEC Take 20 mg by mouth daily.   pantoprazole 40 MG tablet Commonly known as: PROTONIX Take 1 tablet (40 mg total) by mouth daily.   Premarin  vaginal cream Generic drug: conjugated estrogens Dispose of applicator. Apply pea size amount to vaginal area Monday, Wednesday and Friday night.   PriLOSEC OTC 20 MG tablet Generic drug: omeprazole Take 20 mg by mouth daily.       Allergies:  Allergies  Allergen Reactions  . Bactrim [Sulfamethoxazole-Trimethoprim] Hives  . Iodinated Diagnostic Agents Anaphylaxis  . Codeine Nausea And Vomiting  . Erythromycin Ethylsuccinate Other (See Comments)    Unknown   . Phenobarbital Other (See Comments)    Feeling funny, nervous  . Ciprofloxacin Rash    Family History: Family History  Problem Relation Age of Onset  . Diabetes Sister   . Breast cancer Sister        late 88's  . Diabetes Brother   . Diabetes Sister  Social History:  reports that she has never smoked. She has never used smokeless tobacco. She reports that she does not drink alcohol and does not use drugs.  ROS:                                        Physical Exam: There were no vitals taken for this visit.  Constitutional:  Alert and oriented, No acute distress.   Laboratory Data: Lab Results  Component Value Date   WBC 7.3 05/07/2020   HGB 11.1 (L) 05/07/2020   HCT 33.7 (L) 05/07/2020   MCV 92.8 05/07/2020   PLT 244 05/07/2020    Lab Results  Component Value Date   CREATININE 0.95 05/07/2020    No results found for: PSA  No results found for: TESTOSTERONE  Lab Results  Component Value Date   HGBA1C 5.5 04/15/2013    Urinalysis    Component Value Date/Time   COLORURINE YELLOW (A) 05/04/2020 2154   APPEARANCEUR Hazy (A) 06/12/2020 1019   LABSPEC 1.012 05/04/2020 2154   PHURINE 5.0 05/04/2020 2154   GLUCOSEU Negative 06/12/2020 1019   HGBUR NEGATIVE 05/04/2020 2154   BILIRUBINUR Negative 06/12/2020 [place] 05/04/2020 2154   PROTEINUR Negative 06/12/2020 [place] 05/04/2020 2154   NITRITE Negative 06/12/2020 1019   NITRITE  NEGATIVE 05/04/2020 2154   LEUKOCYTESUR Trace (A) 06/12/2020 1019   LEUKOCYTESUR SMALL (A) 05/04/2020 2154    Pertinent Imaging:   Assessment & Plan: 90x3 Keflex sent to pharmacy and see in 1 year  1. Recurrent UTI  - Urinalysis, Complete   No follow-ups on file.  Reece Packer, MD  [facility] [address] [place], [place] [address] [phone]

## 2020-07-15 ENCOUNTER — Ambulatory Visit
Admission: RE | Admit: 2020-07-15 | Discharge: 2020-07-15 | Disposition: A | Discharge status: Home / Self Care | Payer: Medicare Other | Source: Ambulatory Visit | Attending: Internal Medicine | Admitting: Internal Medicine

## 2020-07-15 DIAGNOSIS — R928 Other abnormal and inconclusive findings on diagnostic imaging of breast: Secondary | ICD-10-CM

## 2020-07-15 DIAGNOSIS — N6320 Unspecified lump in the left breast, unspecified quadrant: Secondary | ICD-10-CM

## 2020-07-16 ENCOUNTER — Other Ambulatory Visit: Payer: Self-pay | Admitting: Internal Medicine

## 2020-07-16 DIAGNOSIS — N6320 Unspecified lump in the left breast, unspecified quadrant: Secondary | ICD-10-CM

## 2020-07-16 DIAGNOSIS — R928 Other abnormal and inconclusive findings on diagnostic imaging of breast: Secondary | ICD-10-CM

## 2020-07-17 ENCOUNTER — Ambulatory Visit
Admission: RE | Admit: 2020-07-17 | Discharge: 2020-07-17 | Disposition: A | Discharge status: Home / Self Care | Payer: Medicare Other | Source: Ambulatory Visit | Attending: Internal Medicine | Admitting: Internal Medicine

## 2020-07-17 DIAGNOSIS — R928 Other abnormal and inconclusive findings on diagnostic imaging of breast: Secondary | ICD-10-CM | POA: Diagnosis present

## 2020-07-17 DIAGNOSIS — N6320 Unspecified lump in the left breast, unspecified quadrant: Secondary | ICD-10-CM

## 2020-07-17 HISTORY — PX: BREAST BIOPSY: SHX20

## 2020-07-17 LAB — CULTURE, URINE COMPREHENSIVE

## 2020-07-20 ENCOUNTER — Encounter: Payer: Self-pay | Admitting: *Deleted

## 2020-07-20 ENCOUNTER — Other Ambulatory Visit: Payer: Self-pay | Admitting: *Deleted

## 2020-07-20 DIAGNOSIS — C50912 Malignant neoplasm of unspecified site of left female breast: Secondary | ICD-10-CM

## 2020-07-20 MED ORDER — FOSFOMYCIN TROMETHAMINE 3 G PO PACK: 3.0000 g | PACK | Freq: Once | ORAL | 0 refills | Status: AC

## 2020-07-20 NOTE — Telephone Encounter (Signed)
Left message

## 2020-07-20 NOTE — Progress Notes (Signed)
Notified by Electa Sniff, RN at [facility] that patient had been informed of her new diagnosis and to call her daughter Malachy Mood to schedule any appointments.  I have patient scheduled to see Dr. Peyton Najjar on 07/21/20 @ 1:30.  I am waiting on an appointment with medical oncology.  Patient has a history of right breast cancer and was seen by Dr. Oliva Bustard, who is now retired.  Will notify patient as soon as I have that appointment.  Patient scheduled to see Dr. Rogue Bussing on Thursday 07/23/20 @ 11:30.

## 2020-07-21 ENCOUNTER — Other Ambulatory Visit: Payer: Self-pay | Admitting: General Surgery

## 2020-07-21 DIAGNOSIS — C50512 Malignant neoplasm of lower-outer quadrant of left female breast: Secondary | ICD-10-CM

## 2020-07-22 ENCOUNTER — Ambulatory Visit
Admission: RE | Admit: 2020-07-22 | Discharge: 2020-07-22 | Disposition: A | Discharge status: Home / Self Care | Payer: Medicare Other | Source: Ambulatory Visit | Attending: General Surgery | Admitting: General Surgery

## 2020-07-22 ENCOUNTER — Other Ambulatory Visit: Payer: Self-pay

## 2020-07-22 DIAGNOSIS — C50512 Malignant neoplasm of lower-outer quadrant of left female breast: Secondary | ICD-10-CM | POA: Diagnosis present

## 2020-07-22 MED ORDER — GADOBUTROL 1 MMOL/ML IV SOLN
5.0000 mL | Freq: Once | INTRAVENOUS | Status: AC | PRN
  Administered 2020-07-22: 5 mL via INTRAVENOUS

## 2020-07-23 ENCOUNTER — Encounter: Payer: Self-pay | Admitting: Internal Medicine

## 2020-07-23 ENCOUNTER — Other Ambulatory Visit: Payer: Self-pay

## 2020-07-23 ENCOUNTER — Ambulatory Visit: Payer: Self-pay | Admitting: General Surgery

## 2020-07-23 ENCOUNTER — Inpatient Hospital Stay: Payer: Medicare Other

## 2020-07-23 ENCOUNTER — Other Ambulatory Visit: Payer: Self-pay | Admitting: General Surgery

## 2020-07-23 ENCOUNTER — Inpatient Hospital Stay: Payer: Medicare Other | Attending: Internal Medicine | Admitting: Internal Medicine

## 2020-07-23 DIAGNOSIS — Z171 Estrogen receptor negative status [ER-]: Secondary | ICD-10-CM | POA: Insufficient documentation

## 2020-07-23 DIAGNOSIS — Z87440 Personal history of urinary (tract) infections: Secondary | ICD-10-CM | POA: Diagnosis not present

## 2020-07-23 DIAGNOSIS — I4891 Unspecified atrial fibrillation: Secondary | ICD-10-CM | POA: Diagnosis not present

## 2020-07-23 DIAGNOSIS — Z803 Family history of malignant neoplasm of breast: Secondary | ICD-10-CM | POA: Insufficient documentation

## 2020-07-23 DIAGNOSIS — C50512 Malignant neoplasm of lower-outer quadrant of left female breast: Secondary | ICD-10-CM | POA: Insufficient documentation

## 2020-07-23 DIAGNOSIS — Z780 Asymptomatic menopausal state: Secondary | ICD-10-CM | POA: Diagnosis not present

## 2020-07-23 DIAGNOSIS — Z170 Estrogen receptor positive status [ER+]: Secondary | ICD-10-CM | POA: Insufficient documentation

## 2020-07-23 DIAGNOSIS — Z7901 Long term (current) use of anticoagulants: Secondary | ICD-10-CM | POA: Diagnosis not present

## 2020-07-23 LAB — SURGICAL PATHOLOGY

## 2020-07-23 NOTE — H&P (View-Only) (Signed)
PATIENT PROFILE: Sheryl Michael is a 86 y.o. female who presents to the Clinic for consultation at the request of Dr. Doy Hutching for evaluation of left breast cancer.  PCP:  Idelle Crouch, MD  HISTORY OF PRESENT ILLNESS: Ms. Vitelli reports she had her usual schedule mammogram.  She was found with a mass of the left breast.  Diagnostic mammogram and ultrasound show a 3 mm mass on the left breast.  Core biopsy was done showing invasive lobular carcinoma.  Patient denies any palpable mass, any skin changes, nipple retraction, any nipple discharge.  Family history of breast cancer: Sister Family history of other cancers: None Menarche: 86 years old Menopause: Cannot recall Used OCP: none Used estrogen and progesterone therapy: no  History of Radiation to the chest: contralateral breast Previous biopsy: one on the other breast Number of pregnancies: 3 Age of first pregnancy: 89  PROBLEM LIST:        Problem List  Date Reviewed: 06/17/2020       Noted   History of recurrent UTIs 05/22/2020   Sprain of interphalangeal joint of right ring finger 07/30/2018   Rotator cuff tendinitis, left 07/16/2018   Traumatic complete tear of left rotator cuff 07/16/2018   Strain of left hip 07/16/2018   Encounter for Hemoccult screening 03/12/2018   History of colon polyps 03/12/2018   Hypothyroidism, acquired, unspecified 07/10/2017   PAF (paroxysmal atrial fibrillation) (CMS-HCC) 06/07/2017   Complete tear of right rotator cuff 05/15/2017   Rotator cuff tendinitis, right 12/05/2016   Hyperlipidemia 04/01/2014   Breast cancer (CMS-HCC) 04/01/2014   TIA (transient ischemic attack) 04/01/2014   Osteoporosis 04/01/2014   Goiter 04/01/2014   GERD (gastroesophageal reflux disease) 04/01/2014   Female bladder prolapse Unknown   Schatzki's ring Unknown   Hx of esophageal spasm 02/28/2014      GENERAL REVIEW OF SYSTEMS:   General ROS: negative for - chills, fatigue, fever, weight gain or  weight loss Allergy and Immunology ROS: negative for - hives  Hematological and Lymphatic ROS: negative for - bleeding problems or bruising, negative for palpable nodes Endocrine ROS: negative for - heat or cold intolerance, hair changes Respiratory ROS: negative for - cough, shortness of breath or wheezing Cardiovascular ROS: no chest pain or palpitations GI ROS: negative for nausea, vomiting, abdominal pain, diarrhea, constipation Musculoskeletal ROS: negative for - joint swelling or muscle pain Neurological ROS: negative for - confusion, syncope Dermatological ROS: negative for pruritus and rash Psychiatric: negative for anxiety, depression, difficulty sleeping and memory loss  MEDICATIONS: Current Medications        Current Outpatient Medications  Medication Sig Dispense Refill  . apixaban (ELIQUIS) 2.5 mg tablet Take 1 tablet (2.5 mg total) by mouth every 12 (twelve) hours 90 tablet 7  . calcium carbonate-vitamin D3 (CALTRATE 600+D) 600 mg(1,510m) -200 unit tablet Take 1 tablet by mouth once daily       . cephalexin (KEFLEX) 250 MG capsule       . conjugated estrogens (PREMARIN) 0.625 mg/gram vaginal cream Dispose of applicator. Apply pea size amount to vaginal area Monday, Wednesday and Friday night.    . diltiazem (CARDIZEM CD) 120 MG XR capsule Take 1 capsule (120 mg total) by mouth once daily 60 capsule 3  . escitalopram oxalate (LEXAPRO) 10 MG tablet TAKE 1 TABLET BY MOUTH DAILY 30 tablet 2  . omeprazole (PRILOSEC) 20 MG DR capsule Take by mouth    . SYNTHROID 50 mcg tablet TAKE ONE TABLET ON AN EMPTY  STOMACH WITHA GLASS OF WATER AT LEAST 30 TO 60 MINUTES BEFORE BREAKFAST 90 tablet 3            Current Facility-Administered Medications  Medication Dose Route Frequency Provider Last Rate Last Admin  . cyanocobalamin (VITAMIN B12) injection 1,000 mcg  1,000 mcg Intramuscular Q30 Days Idelle Crouch, MD   1,000 mcg at 07/15/20 1429      ALLERGIES: Iodinated  contrast media, Ciprofloxacin, Codeine sulfate, Erythromycin ethylsuccinate, Penicillin v potassium, Phenobarbital, and Sulfa (sulfonamide antibiotics)  PAST MEDICAL HISTORY:     Past Medical History:  Diagnosis Date  . Atrial fibrillation (CMS-HCC)    Hospitalization at [facility] for a.fib with RVR in 12/2018  . Breast cancer (CMS-HCC) 2007   s/p lumpectomy and radiation  . Female bladder prolapse   . History of colon polyps 03/12/2018  . Hyperlipidemia   . Hypothyroidism   . Influenza    2011, 2012, 2013  . Multiple thyroid nodules    Biopsy of rt and left-sided nodules in 10/2018 were benign  . Osteoporosis, post-menopausal   . Schatzki's ring   . Stroke (CMS-HCC)   . TIA (transient ischemic attack) 2013    PAST SURGICAL HISTORY:      Past Surgical History:  Procedure Laterality Date  . APPENDECTOMY    . BRONCHOSCOPY    . CATARACT EXTRACTION    . COLONOSCOPY  06/03/1998   Adenomatous Polyp  . COLONOSCOPY  09/11/2001   PH Adenomatous Polyp  . COLONOSCOPY  12/15/2004   Adenomatous Polyp  . COLONOSCOPY  02/18/2009   PH Adenomatous Polyps  . COLONOSCOPY  04/03/2014   PH Adenomatous Polyps:  No repeat due to age per RTE  . EGD  02/18/2009   No repeat per RTE  . EGD  04/03/2014   No repeat per RTE  . HYSTERECTOMY TOTAL ABDOMINAL W/REMOVAL TUBES &/OR OVARIES  2017   total vaginal hysterectomy with BSO  . SIGMOIDOSCOPY FLEXIBLE  04/16/1998     FAMILY HISTORY:      Family History  Problem Relation Age of Onset  . Breast cancer Sister   . Throat cancer Sister   . Coronary Artery Disease (Blocked arteries around heart) Mother   . Thyroid disease Mother   . Myocardial Infarction (Heart attack) Mother   . Stroke Father      SOCIAL HISTORY: Social History          Socioeconomic History  . Marital status: Widowed    Spouse name: Not on file  . Number of children: Not on file  . Years of education: Not on file  .  Highest education level: Not on file  Occupational History  . Not on file  Tobacco Use  . Smoking status: Never Smoker  . Smokeless tobacco: Never Used  Vaping Use  . Vaping Use: Never used  Substance and Sexual Activity  . Alcohol use: No  . Drug use: No  . Sexual activity: Never    Partners: Male    Comment: widowed  Other Topics Concern  . Not on file  Social History Narrative  . Not on file   Social Determinants of Health      Financial Resource Strain:   . Difficulty of Paying Living Expenses:   Food Insecurity:   . Worried About Charity fundraiser in the Last Year:   . Arboriculturist in the Last Year:   Transportation Needs:   . Film/video editor (Medical):   Marland Kitchen Lack  of Transportation (Non-Medical):       PHYSICAL EXAM:    Vitals:   07/21/20 1329  BP: 131/61  Pulse: 60   Body mass index is 25 kg/m. Weight: 58.1 kg (128 lb)   GENERAL: Alert, active, oriented x3  HEENT: Pupils equal reactive to light. Extraocular movements are intact. Sclera clear. Palpebral conjunctiva normal red color.Pharynx clear.  NECK: Supple with no palpable mass and no adenopathy.  LUNGS: Sound clear with no rales rhonchi or wheezes.  HEART: Regular rhythm S1 and S2 without murmur.  BREAST: breasts appear normal, no suspicious masses, no skin or nipple changes or axillary nodes.  ABDOMEN: Soft and depressible, nontender with no palpable mass, no hepatomegaly.  EXTREMITIES: Well-developed well-nourished symmetrical with no dependent edema.  NEUROLOGICAL: Awake alert oriented, facial expression symmetrical, moving all extremities.  REVIEW OF DATA: I have reviewed the following data today:      Office Visit on 05/22/2020  Component Date Value  . Glucose 05/22/2020 58*  . Sodium 05/22/2020 139   . Potassium 05/22/2020 5.0   . Chloride 05/22/2020 104   . Carbon Dioxide (CO2) 05/22/2020 29.7   . Urea Nitrogen (BUN) 05/22/2020 20   . Creatinine  05/22/2020 1.0   . Glomerular Filtration Ra* 05/22/2020 52*  . Calcium 05/22/2020 9.2   . AST  05/22/2020 12   . ALT  05/22/2020 9   . Alk Phos (alkaline Phosp* 05/22/2020 90   . Albumin 05/22/2020 3.6   . Bilirubin, Total 05/22/2020 0.5   . Protein, Total 05/22/2020 6.6   . A/G Ratio 05/22/2020 1.2   . WBC (White Blood Cell Co* 05/22/2020 6.3   . RBC (Red Blood Cell Coun* 05/22/2020 4.04   . Hemoglobin 05/22/2020 12.2   . Hematocrit 05/22/2020 38.4   . MCV (Mean Corpuscular Vo* 05/22/2020 95.0   . MCH (Mean Corpuscular He* 05/22/2020 30.2   . MCHC (Mean Corpuscular H* 05/22/2020 31.8*  . Platelet Count 05/22/2020 288   . RDW-CV (Red Cell Distrib* 05/22/2020 14.9*  . MPV (Mean Platelet Volum* 05/22/2020 9.4   . Neutrophils 05/22/2020 3.34   . Lymphocytes 05/22/2020 1.46   . Monocytes 05/22/2020 0.67   . Eosinophils 05/22/2020 0.71*  . Basophils 05/22/2020 0.06   . Neutrophil % 05/22/2020 53.4   . Lymphocyte % 05/22/2020 23.3   . Monocyte % 05/22/2020 10.7   . Eosinophil % 05/22/2020 11.3*  . Basophil% 05/22/2020 1.0   . Immature Granulocyte % 05/22/2020 0.3   . Immature Granulocyte Cou* 05/22/2020 0.02   . Cholesterol, Total 05/22/2020 196   . Triglyceride 05/22/2020 106   . HDL (High Density Lipopr* 05/22/2020 49.2   . LDL Calculated 05/22/2020 126   . VLDL Cholesterol 05/22/2020 21   . Cholesterol/HDL Ratio 05/22/2020 4.0   . Thyroid Stimulating Horm* 05/22/2020 2.155   . Hemoglobin A1C 05/22/2020 6.3*  . Average Blood Glucose (C* 05/22/2020 134   . Vitamin B12 05/22/2020 350   . Urine Culture, Routine -* 05/22/2020 Final report*  . Result 1 - [facility] 05/22/2020 Comment*  . Antimicrobial Susceptibi* 05/22/2020 Comment   Office Visit on 04/30/2020  Component Date Value  . Urine Culture, Routine -* 04/30/2020 Final report*  . Result 1 - [facility] 04/30/2020 Enterococcus faecalis*  . Antimicrobial Susceptibi* 04/30/2020 Comment   . Color 04/30/2020 Yellow   . Clarity  04/30/2020 Clear   . Specific Gravity 04/30/2020 1.015   . pH, Urine 04/30/2020 6.5   . Protein, Urinalysis 04/30/2020 Negative   .  Glucose, Urinalysis 04/30/2020 Negative   . Ketones, Urinalysis 04/30/2020 Negative   . Blood, Urinalysis 04/30/2020 Negative   . Nitrite, Urinalysis 04/30/2020 Negative   . Leukocyte Esterase, Urin* 04/30/2020 Negative   . White Blood Cells, Urina* 04/30/2020 0-3   . Red Blood Cells, Urinaly* 04/30/2020 0-3   . Bacteria, Urinalysis 04/30/2020 None Seen   . Squamous Epithelial Cell* 04/30/2020 Few      ASSESSMENT: Ms. Stiefel is a 86 y.o. female presenting for consultation for left breast cancer.    Patient was oriented again about the pathology results. Surgical alternatives were discussed with patient including partial vs total mastectomy. Surgical technique and post operative care was discussed with patient. Risk of surgery was discussed with patient including but not limited to: wound infection, seroma, hematoma, brachial plexopathy, mondor's disease (thrombosis of small veins of breast), chronic wound pain, breast lymphedema, altered sensation to the nipple and cosmesis among others.   Due to lobular features MRI was recommended.  I discussed with the patient recommendation regular MRI.  She understood and agreed to proceed with MRI.  MRI done and show no other concerning area on either breast.   Malignant neoplasm of lower-outer quadrant of left female breast, unspecified estrogen receptor status (CMS-HCC) [C50.512]  PLAN: 1. Left breast radiofrequency tagged partial mastectomy and sentinel lymph node biopsy  Patient and her family verbalized understanding, all questions were answered, and were agreeable with the plan outlined above.     Herbert Pun, MD  Electronically signed by Herbert Pun, MD

## 2020-07-23 NOTE — Assessment & Plan Note (Addendum)
#  Clinical stage I- LEFT BREAST INVASIVE MAMMARY CA with  LOBULAR CANCER-receptor status pending.  3 to 4 mm size-on biopsy radiology.  MRI-biopsy artifact clip otherwise no other disease noted.   # I had a long discussion with the patient and daughter in general regarding the treatment options of breast cancer including-surgery; adjuvant radiation; role of adjuvant systemic therapy including-chemotherapy antihormone therapy.   # Patient will likely need lumpectomy with sentinel lymph node evaluation.  Radiation at her age could be considered optional, however await receptor status.  We will also discuss at tumor conference.  #Chemotherapy less likely to be offered given her age/comorbidities/early stage breast cancer.  Patient/daughter are understandably also quite reluctant with chemotherapeutic option.  Again, decision pending at this time while awaiting receptor status.  #A. Fib-on Eliquis/amiodarone-clinically stable.  #History of UTIs- on prophylaxis Kelfex; awaiting evaluation at [facility]  Thank you-  for allowing me to participate in the care of your pleasant patient. Please do not hesitate to contact me with questions or concerns in the interim.  # DISPOSITION: # follow up TBD- Dr.B   ADDENDUM: Spoke to Dr. Peyton Najjar receptor status -interestingly shows ER-LOW [1-10%]; PR- NEG; Her-2 POSITIVE.  However given early stage breast cancer-the treatment recommendation would still be surgery upfront.  Final decisions for systemic therapy would be considered post surgery.will discuss with daughter.

## 2020-07-23 NOTE — H&P (Signed)
PATIENT PROFILE: Sheryl Michael is a 86 y.o. female who presents to the Clinic for consultation at the request of Dr. Doy Hutching for evaluation of left breast cancer.  PCP:  Idelle Crouch, MD  HISTORY OF PRESENT ILLNESS: Sheryl Michael reports she had her usual schedule mammogram.  She was found with a mass of the left breast.  Diagnostic mammogram and ultrasound show a 3 mm mass on the left breast.  Core biopsy was done showing invasive lobular carcinoma.  Patient denies any palpable mass, any skin changes, nipple retraction, any nipple discharge.  Family history of breast cancer: Sister Family history of other cancers: None Menarche: 86 years old Menopause: Cannot recall Used OCP: none Used estrogen and progesterone therapy: no  History of Radiation to the chest: contralateral breast Previous biopsy: one on the other breast Number of pregnancies: 3 Age of first pregnancy: 31  PROBLEM LIST:        Problem List  Date Reviewed: 06/17/2020       Noted   History of recurrent UTIs 05/22/2020   Sprain of interphalangeal joint of right ring finger 07/30/2018   Rotator cuff tendinitis, left 07/16/2018   Traumatic complete tear of left rotator cuff 07/16/2018   Strain of left hip 07/16/2018   Encounter for Hemoccult screening 03/12/2018   History of colon polyps 03/12/2018   Hypothyroidism, acquired, unspecified 07/10/2017   PAF (paroxysmal atrial fibrillation) (CMS-HCC) 06/07/2017   Complete tear of right rotator cuff 05/15/2017   Rotator cuff tendinitis, right 12/05/2016   Hyperlipidemia 04/01/2014   Breast cancer (CMS-HCC) 04/01/2014   TIA (transient ischemic attack) 04/01/2014   Osteoporosis 04/01/2014   Goiter 04/01/2014   GERD (gastroesophageal reflux disease) 04/01/2014   Female bladder prolapse Unknown   Schatzki's ring Unknown   Hx of esophageal spasm 02/28/2014      GENERAL REVIEW OF SYSTEMS:   General ROS: negative for - chills, fatigue, fever, weight gain or  weight loss Allergy and Immunology ROS: negative for - hives  Hematological and Lymphatic ROS: negative for - bleeding problems or bruising, negative for palpable nodes Endocrine ROS: negative for - heat or cold intolerance, hair changes Respiratory ROS: negative for - cough, shortness of breath or wheezing Cardiovascular ROS: no chest pain or palpitations GI ROS: negative for nausea, vomiting, abdominal pain, diarrhea, constipation Musculoskeletal ROS: negative for - joint swelling or muscle pain Neurological ROS: negative for - confusion, syncope Dermatological ROS: negative for pruritus and rash Psychiatric: negative for anxiety, depression, difficulty sleeping and memory loss  MEDICATIONS: Current Medications        Current Outpatient Medications  Medication Sig Dispense Refill  . apixaban (ELIQUIS) 2.5 mg tablet Take 1 tablet (2.5 mg total) by mouth every 12 (twelve) hours 90 tablet 7  . calcium carbonate-vitamin D3 (CALTRATE 600+D) 600 mg(1,578m) -200 unit tablet Take 1 tablet by mouth once daily       . cephalexin (KEFLEX) 250 MG capsule       . conjugated estrogens (PREMARIN) 0.625 mg/gram vaginal cream Dispose of applicator. Apply pea size amount to vaginal area Monday, Wednesday and Friday night.    . diltiazem (CARDIZEM CD) 120 MG XR capsule Take 1 capsule (120 mg total) by mouth once daily 60 capsule 3  . escitalopram oxalate (LEXAPRO) 10 MG tablet TAKE 1 TABLET BY MOUTH DAILY 30 tablet 2  . omeprazole (PRILOSEC) 20 MG DR capsule Take by mouth    . SYNTHROID 50 mcg tablet TAKE ONE TABLET ON AN EMPTY  STOMACH WITHA GLASS OF WATER AT LEAST 30 TO 60 MINUTES BEFORE BREAKFAST 90 tablet 3            Current Facility-Administered Medications  Medication Dose Route Frequency Provider Last Rate Last Admin  . cyanocobalamin (VITAMIN B12) injection 1,000 mcg  1,000 mcg Intramuscular Q30 Days Sparks, Jeffrey D, MD   1,000 mcg at 07/15/20 1429      ALLERGIES: Iodinated  contrast media, Ciprofloxacin, Codeine sulfate, Erythromycin ethylsuccinate, Penicillin v potassium, Phenobarbital, and Sulfa (sulfonamide antibiotics)  PAST MEDICAL HISTORY:     Past Medical History:  Diagnosis Date  . Atrial fibrillation (CMS-HCC)    Hospitalization at [facility] for a.fib with RVR in 12/2018  . Breast cancer (CMS-HCC) 2007   s/p lumpectomy and radiation  . Female bladder prolapse   . History of colon polyps 03/12/2018  . Hyperlipidemia   . Hypothyroidism   . Influenza    2011, 2012, 2013  . Multiple thyroid nodules    Biopsy of rt and left-sided nodules in 10/2018 were benign  . Osteoporosis, post-menopausal   . Schatzki's ring   . Stroke (CMS-HCC)   . TIA (transient ischemic attack) 2013    PAST SURGICAL HISTORY:      Past Surgical History:  Procedure Laterality Date  . APPENDECTOMY    . BRONCHOSCOPY    . CATARACT EXTRACTION    . COLONOSCOPY  06/03/1998   Adenomatous Polyp  . COLONOSCOPY  09/11/2001   PH Adenomatous Polyp  . COLONOSCOPY  12/15/2004   Adenomatous Polyp  . COLONOSCOPY  02/18/2009   PH Adenomatous Polyps  . COLONOSCOPY  04/03/2014   PH Adenomatous Polyps:  No repeat due to age per RTE  . EGD  02/18/2009   No repeat per RTE  . EGD  04/03/2014   No repeat per RTE  . HYSTERECTOMY TOTAL ABDOMINAL W/REMOVAL TUBES &/OR OVARIES  2017   total vaginal hysterectomy with BSO  . SIGMOIDOSCOPY FLEXIBLE  04/16/1998     FAMILY HISTORY:      Family History  Problem Relation Age of Onset  . Breast cancer Sister   . Throat cancer Sister   . Coronary Artery Disease (Blocked arteries around heart) Mother   . Thyroid disease Mother   . Myocardial Infarction (Heart attack) Mother   . Stroke Father      SOCIAL HISTORY: Social History          Socioeconomic History  . Marital status: Widowed    Spouse name: Not on file  . Number of children: Not on file  . Years of education: Not on file  .  Highest education level: Not on file  Occupational History  . Not on file  Tobacco Use  . Smoking status: Never Smoker  . Smokeless tobacco: Never Used  Vaping Use  . Vaping Use: Never used  Substance and Sexual Activity  . Alcohol use: No  . Drug use: No  . Sexual activity: Never    Partners: Male    Comment: widowed  Other Topics Concern  . Not on file  Social History Narrative  . Not on file   Social Determinants of Health      Financial Resource Strain:   . Difficulty of Paying Living Expenses:   Food Insecurity:   . Worried About Running Out of Food in the Last Year:   . Ran Out of Food in the Last Year:   Transportation Needs:   . Lack of Transportation (Medical):   . Lack   of Transportation (Non-Medical):       PHYSICAL EXAM:    Vitals:   07/21/20 1329  BP: 131/61  Pulse: 60   Body mass index is 25 kg/m. Weight: 58.1 kg (128 lb)   GENERAL: Alert, active, oriented x3  HEENT: Pupils equal reactive to light. Extraocular movements are intact. Sclera clear. Palpebral conjunctiva normal red color.Pharynx clear.  NECK: Supple with no palpable mass and no adenopathy.  LUNGS: Sound clear with no rales rhonchi or wheezes.  HEART: Regular rhythm S1 and S2 without murmur.  BREAST: breasts appear normal, no suspicious masses, no skin or nipple changes or axillary nodes.  ABDOMEN: Soft and depressible, nontender with no palpable mass, no hepatomegaly.  EXTREMITIES: Well-developed well-nourished symmetrical with no dependent edema.  NEUROLOGICAL: Awake alert oriented, facial expression symmetrical, moving all extremities.  REVIEW OF DATA: I have reviewed the following data today:      Office Visit on 05/22/2020  Component Date Value  . Glucose 05/22/2020 58*  . Sodium 05/22/2020 139   . Potassium 05/22/2020 5.0   . Chloride 05/22/2020 104   . Carbon Dioxide (CO2) 05/22/2020 29.7   . Urea Nitrogen (BUN) 05/22/2020 20   . Creatinine  05/22/2020 1.0   . Glomerular Filtration Ra* 05/22/2020 52*  . Calcium 05/22/2020 9.2   . AST  05/22/2020 12   . ALT  05/22/2020 9   . Alk Phos (alkaline Phosp* 05/22/2020 90   . Albumin 05/22/2020 3.6   . Bilirubin, Total 05/22/2020 0.5   . Protein, Total 05/22/2020 6.6   . A/G Ratio 05/22/2020 1.2   . WBC (White Blood Cell Co* 05/22/2020 6.3   . RBC (Red Blood Cell Coun* 05/22/2020 4.04   . Hemoglobin 05/22/2020 12.2   . Hematocrit 05/22/2020 38.4   . MCV (Mean Corpuscular Vo* 05/22/2020 95.0   . MCH (Mean Corpuscular He* 05/22/2020 30.2   . MCHC (Mean Corpuscular H* 05/22/2020 31.8*  . Platelet Count 05/22/2020 288   . RDW-CV (Red Cell Distrib* 05/22/2020 14.9*  . MPV (Mean Platelet Volum* 05/22/2020 9.4   . Neutrophils 05/22/2020 3.34   . Lymphocytes 05/22/2020 1.46   . Monocytes 05/22/2020 0.67   . Eosinophils 05/22/2020 0.71*  . Basophils 05/22/2020 0.06   . Neutrophil % 05/22/2020 53.4   . Lymphocyte % 05/22/2020 23.3   . Monocyte % 05/22/2020 10.7   . Eosinophil % 05/22/2020 11.3*  . Basophil% 05/22/2020 1.0   . Immature Granulocyte % 05/22/2020 0.3   . Immature Granulocyte Cou* 05/22/2020 0.02   . Cholesterol, Total 05/22/2020 196   . Triglyceride 05/22/2020 106   . HDL (High Density Lipopr* 05/22/2020 49.2   . LDL Calculated 05/22/2020 126   . VLDL Cholesterol 05/22/2020 21   . Cholesterol/HDL Ratio 05/22/2020 4.0   . Thyroid Stimulating Horm* 05/22/2020 2.155   . Hemoglobin A1C 05/22/2020 6.3*  . Average Blood Glucose (C* 05/22/2020 134   . Vitamin B12 05/22/2020 350   . Urine Culture, Routine -* 05/22/2020 Final report*  . Result 1 - [facility] 05/22/2020 Comment*  . Antimicrobial Susceptibi* 05/22/2020 Comment   Office Visit on 04/30/2020  Component Date Value  . Urine Culture, Routine -* 04/30/2020 Final report*  . Result 1 - [facility] 04/30/2020 Enterococcus faecalis*  . Antimicrobial Susceptibi* 04/30/2020 Comment   . Color 04/30/2020 Yellow   . Clarity  04/30/2020 Clear   . Specific Gravity 04/30/2020 1.015   . pH, Urine 04/30/2020 6.5   . Protein, Urinalysis 04/30/2020 Negative   .  Glucose, Urinalysis 04/30/2020 Negative   . Ketones, Urinalysis 04/30/2020 Negative   . Blood, Urinalysis 04/30/2020 Negative   . Nitrite, Urinalysis 04/30/2020 Negative   . Leukocyte Esterase, Urin* 04/30/2020 Negative   . White Blood Cells, Urina* 04/30/2020 0-3   . Red Blood Cells, Urinaly* 04/30/2020 0-3   . Bacteria, Urinalysis 04/30/2020 None Seen   . Squamous Epithelial Cell* 04/30/2020 Few      ASSESSMENT: Ms. Mccroskey is a 86 y.o. female presenting for consultation for left breast cancer.    Patient was oriented again about the pathology results. Surgical alternatives were discussed with patient including partial vs total mastectomy. Surgical technique and post operative care was discussed with patient. Risk of surgery was discussed with patient including but not limited to: wound infection, seroma, hematoma, brachial plexopathy, mondor's disease (thrombosis of small veins of breast), chronic wound pain, breast lymphedema, altered sensation to the nipple and cosmesis among others.   Due to lobular features MRI was recommended.  I discussed with the patient recommendation regular MRI.  She understood and agreed to proceed with MRI.  MRI done and show no other concerning area on either breast.   Malignant neoplasm of lower-outer quadrant of left female breast, unspecified estrogen receptor status (CMS-HCC) [C50.512]  PLAN: 1. Left breast radiofrequency tagged partial mastectomy and sentinel lymph node biopsy  Patient and her family verbalized understanding, all questions were answered, and were agreeable with the plan outlined above.     Yesennia Hirota Cintron-Diaz, MD  Electronically signed by Jocob Dambach Cintron-Diaz, MD  

## 2020-07-23 NOTE — Telephone Encounter (Signed)
Fosfamycin 3 gram one sachet once only  Then go back on daily antibiotic  Resistent to other meds per [employer]

## 2020-07-23 NOTE — Progress Notes (Signed)
Supported patient, and daughter Malachy Mood at initial Med/Onc appointment with Dr. Rogue Bussing.   Hormone/HER2 receptor status pending.  Plan for lumpectomy and will follow up with Oncology 2 weeks post-op.  Daughter requests to be initial contact and is preferred contact number in chart.

## 2020-07-23 NOTE — Progress Notes (Signed)
one [place] NOTE  Patient Care Team: Idelle Crouch, MD as PCP - General (Internal Medicine)  CHIEF COMPLAINTS/PURPOSE OF CONSULTATION: Breast cancer  #  Oncology History Overview Note  #Right breast cancer [2006; Dr.Choksi]-s/p lumpectomy followed by radiation; ? Endocrine therapy.   # AUG 2021- LEFT BREAST INVASIVE MAMMARY CA with lobular features; ER/PR/Her-2 NEU- pending.   1. Biopsy clip artifact and post biopsy changes within the LOWER OUTER LEFT breast at the site of biopsy-proven malignancy. 2. No evidence of multifocal, multicentric or contralateral malignancy. No abnormal appearing lymph nodes.  # A.fib- on eliuiqs [Dr.callwood]; frequent UTIs [[facility] ]; COVID in dec 2021.   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  female female with prior history of right breast cancer has been referred to [place] for further evaluation recommendations for new diagnosis of breast cancer.  Patient was diagnosed with right breast cancer status post surgery; radiation; no chemotherapy.  Patient not sure if she got any antihormone therapy.   Patient states she was found to have an abnormal screening mammogram which led to diagnostic mammogram/ultrasound/followed by biopsy-as summarized above.  Review of Systems  Constitutional: Negative for chills, diaphoresis, fever, malaise/fatigue and weight loss.  HENT: Negative for nosebleeds and sore throat.   Eyes: Negative for double vision.  Respiratory: Negative for cough, hemoptysis, sputum production, shortness of breath and wheezing.   Cardiovascular: Negative for  chest pain, palpitations, orthopnea and leg swelling.  Gastrointestinal: Negative for abdominal pain, blood in stool, constipation, diarrhea, heartburn, melena, nausea and vomiting.  Genitourinary: Negative for dysuria, frequency and urgency.  Musculoskeletal: Positive for back pain and joint pain.  Skin: Negative.  Negative for itching and rash.  Neurological: Negative for dizziness, tingling, focal weakness, weakness and headaches.  Endo/Heme/Allergies: Does not bruise/bleed easily.  Psychiatric/Behavioral: Negative for depression. The patient is not nervous/anxious and does not have insomnia.      MEDICAL HISTORY:  Past Medical History:  Diagnosis Date  . Arthritis   . Atrial fibrillation ([place])   . Breast cancer ([place]) 2006   right breast ca with lumpectomy and rad tx  . Colon polyp   . Cystocele   . Depression   . Female bladder prolapse   . GERD (gastroesophageal reflux disease)   . Goiter   . Hyperlipemia   . Hypothyroidism   . Osteoporosis   . Personal history of radiation therapy 2006   right breast ca  . Pneumonia   . Procidentia of uterus   . Recurrent UTI   . Reflux   . TIA (transient ischemic attack)   . Vaginal atrophy     SURGICAL HISTORY: Past Surgical History:  Procedure Laterality Date  . APPENDECTOMY    . BREAST BIOPSY Right 2006   breast cancer  . BREAST LUMPECTOMY Right 2006   positive  . BREAST SURGERY Right    lumpectomy  . CATARACT EXTRACTION W/ INTRAOCULAR LENS  IMPLANT, BILATERAL    . COLONOSCOPY    . CYSTOCELE REPAIR N/A 12/21/2015   Procedure: ANTERIOR REPAIR (CYSTOCELE);  Surgeon: Alanda Slim Defrancesco,  MD;  Location: [facility] ORS;  Service: Gynecology;  Laterality: N/A;  . DILATION AND CURETTAGE OF UTERUS    . ELECTROMAGNETIC NAVIGATION BROCHOSCOPY Right 12/13/2018   Procedure: ELECTROMAGNETIC NAVIGATION BRONCHOSCOPY;  Surgeon: Flora Lipps, MD;  Location: [facility] ORS;  Service: Cardiopulmonary;  Laterality: Right;  . EYE SURGERY    . VAGINAL  HYSTERECTOMY Bilateral 12/21/2015   Procedure: TVH BSO;  Surgeon: Brayton Mars, MD;  Location: [facility] ORS;  Service: Gynecology;  Laterality: Bilateral;    SOCIAL HISTORY: Social History   Socioeconomic History  . Marital status: Widowed    Spouse name: Not on file  . Number of children: Not on file  . Years of education: Not on file  . Highest education level: Not on file  Occupational History  . Not on file  Tobacco Use  . Smoking status: Never Smoker  . Smokeless tobacco: Never Used  Vaping Use  . Vaping Use: Never used  Substance and Sexual Activity  . Alcohol use: No  . Drug use: No  . Sexual activity: Never    Birth control/protection: Post-menopausal  Other Topics Concern  . Not on file  Social History Narrative  . Not on file   Social Determinants of Health   Financial Resource Strain:   . Difficulty of Paying Living Expenses: Not on file  Food Insecurity:   . Worried About Charity fundraiser in the Last Year: Not on file  . Ran Out of Food in the Last Year: Not on file  Transportation Needs:   . Lack of Transportation (Medical): Not on file  . Lack of Transportation (Non-Medical): Not on file  Physical Activity:   . Days of Exercise per Week: Not on file  . Minutes of Exercise per Session: Not on file  Stress:   . Feeling of Stress : Not on file  Social Connections:   . Frequency of Communication with Friends and Family: Not on file  . Frequency of Social Gatherings with Friends and Family: Not on file  . Attends Religious Services: Not on file  . Active Member of Clubs or Organizations: Not on file  . Attends Archivist Meetings: Not on file  . Marital Status: Not on file  Intimate Partner Violence:   . Fear of Current or Ex-Partner: Not on file  . Emotionally Abused: Not on file  . Physically Abused: Not on file  . Sexually Abused: Not on file    FAMILY HISTORY: Family History  Problem Relation Age of Onset  . Diabetes Sister    . Breast cancer Sister        late 68's  . Diabetes Brother   . Diabetes Sister     ALLERGIES:  is allergic to bactrim [sulfamethoxazole-trimethoprim], iodinated diagnostic agents, codeine, erythromycin ethylsuccinate, phenobarbital, and ciprofloxacin.  MEDICATIONS:  Current Outpatient Medications  Medication Sig Dispense Refill  . acetaminophen (TYLENOL) 325 MG tablet Take 650 mg by mouth every 6 (six) hours as needed for moderate pain.    Marland Kitchen amiodarone (PACERONE) 200 MG tablet Take 200 mg by mouth daily.    Marland Kitchen apixaban (ELIQUIS) 2.5 MG TABS tablet Take 1 tablet (2.5 mg total) by mouth 2 (two) times daily. (Patient taking differently: Take 2.5 mg by mouth every 12 (twelve) hours. ) 60 tablet 0  . atorvastatin (LIPITOR) 10 MG tablet Take 1 tablet by mouth at bedtime.    . Calcium Carbonate-Vitamin D 600-200 MG-UNIT TABS Take 1 tablet by mouth daily as needed.    Marland Kitchen  cephALEXin (KEFLEX) 250 MG capsule Take 1 capsule (250 mg total) by mouth daily. 90 capsule 3  . conjugated estrogens (PREMARIN) vaginal cream Dispose of applicator. Apply pea size amount to vaginal area Monday, Wednesday and Friday night. 42.5 g 11  . cyanocobalamin (,VITAMIN B-12,) 1000 MCG/ML injection Inject 1,000 mcg into the muscle every 28 (twenty-eight) days.    Marland Kitchen diltiazem (CARDIZEM CD) 120 MG 24 hr capsule Take 1 capsule (120 mg total) by mouth daily. 30 capsule 0  . diltiazem (TIAZAC) 120 MG 24 hr capsule Take by mouth.    . escitalopram (LEXAPRO) 10 MG tablet Take 10 mg by mouth daily.     . feeding supplement, ENSURE ENLIVE, (ENSURE ENLIVE) LIQD Take 237 mLs by mouth 2 (two) times daily between meals. 237 mL 12  . fosfomycin (MONUROL) 3 g PACK TAKE AS SINGLE DOSE    . levothyroxine (SYNTHROID, LEVOTHROID) 50 MCG tablet Take 50 mcg by mouth daily before breakfast.    . omeprazole (PRILOSEC OTC) 20 MG tablet Take 20 mg by mouth daily.     Marland Kitchen omeprazole (PRILOSEC) 20 MG capsule Take 20 mg by mouth daily.    .  pantoprazole (PROTONIX) 40 MG tablet Take 1 tablet (40 mg total) by mouth daily. 30 tablet 0   No current facility-administered medications for this visit.      Marland Kitchen  PHYSICAL EXAMINATION: ECOG PERFORMANCE STATUS: 0 - Asymptomatic  Vitals:   07/23/20 1146  BP: (!) 142/52  Pulse: 60  Resp: 16  Temp: 98.1 F (36.7 C)  SpO2: 100%   Filed Weights   07/23/20 1146  Weight: 127 lb (57.6 kg)    Physical Exam HENT:     Head: Normocephalic and atraumatic.     Mouth/Throat:     Pharynx: No oropharyngeal exudate.  Eyes:     Pupils: Pupils are equal, round, and reactive to light.  Cardiovascular:     Rate and Rhythm: Normal rate and regular rhythm.  Pulmonary:     Effort: Pulmonary effort is normal. No respiratory distress.     Breath sounds: No wheezing.  Abdominal:     General: Bowel sounds are normal. There is no distension.     Palpations: Abdomen is soft. There is no mass.     Tenderness: There is no abdominal tenderness. There is no guarding or rebound.  Musculoskeletal:        General: No tenderness. Normal range of motion.     Cervical back: Normal range of motion and neck supple.  Skin:    General: Skin is warm.  Neurological:     Mental Status: She is alert and oriented to person, place, and time.  Psychiatric:        Mood and Affect: Affect normal.      LABORATORY DATA:  I have reviewed the data as listed Lab Results  Component Value Date   WBC 7.3 05/07/2020   HGB 11.1 (L) 05/07/2020   HCT 33.7 (L) 05/07/2020   MCV 92.8 05/07/2020   PLT 244 05/07/2020   Recent Labs    03/04/20 2253 03/05/20 0418 05/04/20 1823 05/04/20 1823 05/05/20 0434 05/06/20 0458 05/07/20 0503  NA 135   < > 139  --  140 139  --   K 4.1   < > 3.7  --  4.0 4.2  --   CL 103   < > 103  --  108 109  --   CO2 23   < > 25  --  25 23  --   GLUCOSE 120*   < > 113*  --  133* 82  --   BUN 16   < > 16  --  18 16  --   CREATININE 1.09*   < > 1.23*   < > 1.06* 0.91 0.95  CALCIUM 8.9    < > 8.9  --  8.4* 8.4*  --   GFRNONAA 46*   < > 39*   < > 47* 57* 54*  GFRAA 53*   < > 46*   < > 55* >60 >60  PROT 7.1  --   --   --   --   --   --   ALBUMIN 3.5  --   --   --   --   --   --   AST 17  --   --   --   --   --   --   ALT 12  --   --   --   --   --   --   ALKPHOS 68  --   --   --   --   --   --   BILITOT 0.8  --   --   --   --   --   --    < > = values in this interval not displayed.    RADIOGRAPHIC STUDIES: I have personally reviewed the radiological images as listed and agreed with the findings in the report. MR BREAST BILATERAL W WO CONTRAST INC CAD  Result Date: 07/22/2020 CLINICAL DATA:  86 year old female with newly diagnosed 0.3 cm LOWER OUTER LEFT breast malignancy. History of RIGHT breast cancer and lumpectomy in 2007. LABS:  None performed today EXAM: BILATERAL BREAST MRI WITH AND WITHOUT CONTRAST TECHNIQUE: Multiplanar, multisequence MR images of both breasts were obtained prior to and following the intravenous administration of 5 ml of Gadavist Three-dimensional MR images were rendered by post-processing of the original MR data on an independent workstation. The three-dimensional MR images were interpreted, and findings are reported in the following complete MRI report for this study. Three dimensional images were evaluated at the independent interpreting workstation using the DynaCAD thin client. COMPARISON:  Prior mammograms FINDINGS: Breast composition: a. Almost entirely fat. Background parenchymal enhancement: Minimal Right breast: No suspicious mass or abnormal enhancement. Lumpectomy changes are present. Left breast: Biopsy clip artifact and post biopsy changes within the LOWER OUTER LEFT breast identified, middle depth. No suspicious enhancement is identified. The biopsied malignancy is not visualized as a discrete lesion on this exam. Lymph nodes: No abnormal appearing lymph nodes. Ancillary findings:  None. IMPRESSION: 1. Biopsy clip artifact and post biopsy changes  within the LOWER OUTER LEFT breast at the site of biopsy-proven malignancy. 2. No evidence of multifocal, multicentric or contralateral malignancy. No abnormal appearing lymph nodes. RECOMMENDATION: Treatment plan BI-RADS CATEGORY  6: Known biopsy-proven malignancy. Electronically Signed   By: Margarette Canada M.D.   On: 07/22/2020 15:46   US BREAST LTD UNI LEFT INC AXILLA  Result Date: 07/15/2020 CLINICAL DATA:  Screening recall for a possible left breast mass. The patient has history of a right breast cancer status post lumpectomy in 2007. EXAM: DIGITAL DIAGNOSTIC UNILATERAL LEFT MAMMOGRAM WITH TOMO AND CAD; ULTRASOUND LEFT BREAST LIMITED COMPARISON:  Previous exam(s). ACR Breast Density Category a: The breast tissue is almost entirely fatty. FINDINGS: There is a subcentimeter round spiculated mass in the lower slightly outer left breast, posterior depth. Mammographic images  were processed with CAD. Ultrasound targeted to the left breast at 5 o'clock, 4 cm from the nipple demonstrates a round mass with indistinct margins measuring 3 x 3 x 3 mm. Ultrasound of the left axilla demonstrates multiple normal-appearing lymph nodes. IMPRESSION: 1.  There is a suspicious 3 mm mass in the left breast at 5 o'clock. 2.  No evidence of left axillary lymphadenopathy. RECOMMENDATION: Ultrasound guided biopsy is recommended for the left breast mass. We will schedule the patient at her earliest convenience. I have discussed the findings and recommendations with the patient. If applicable, a reminder letter will be sent to the patient regarding the next appointment. BI-RADS CATEGORY  5: Highly suggestive of malignancy. Electronically Signed   By: Ammie Ferrier M.D.   On: 07/15/2020 12:10   MM DIAG BREAST TOMO UNI LEFT  Result Date: 07/15/2020 CLINICAL DATA:  Screening recall for a possible left breast mass. The patient has history of a right breast cancer status post lumpectomy in 2007. EXAM: DIGITAL DIAGNOSTIC UNILATERAL  LEFT MAMMOGRAM WITH TOMO AND CAD; ULTRASOUND LEFT BREAST LIMITED COMPARISON:  Previous exam(s). ACR Breast Density Category a: The breast tissue is almost entirely fatty. FINDINGS: There is a subcentimeter round spiculated mass in the lower slightly outer left breast, posterior depth. Mammographic images were processed with CAD. Ultrasound targeted to the left breast at 5 o'clock, 4 cm from the nipple demonstrates a round mass with indistinct margins measuring 3 x 3 x 3 mm. Ultrasound of the left axilla demonstrates multiple normal-appearing lymph nodes. IMPRESSION: 1.  There is a suspicious 3 mm mass in the left breast at 5 o'clock. 2.  No evidence of left axillary lymphadenopathy. RECOMMENDATION: Ultrasound guided biopsy is recommended for the left breast mass. We will schedule the patient at her earliest convenience. I have discussed the findings and recommendations with the patient. If applicable, a reminder letter will be sent to the patient regarding the next appointment. BI-RADS CATEGORY  5: Highly suggestive of malignancy. Electronically Signed   By: Ammie Ferrier M.D.   On: 07/15/2020 12:10   MM 3D SCREEN BREAST BILATERAL  Result Date: 07/03/2020 CLINICAL DATA:  Screening. EXAM: DIGITAL SCREENING BILATERAL MAMMOGRAM WITH TOMO AND CAD COMPARISON:  Previous exam(s). ACR Breast Density Category b: There are scattered areas of fibroglandular density. FINDINGS: In the left breast, a possible mass warrants further evaluation. In the right breast, no findings suspicious for malignancy. Images were processed with CAD. IMPRESSION: Further evaluation is suggested for a possible mass in the left breast. RECOMMENDATION: Diagnostic mammogram and possibly ultrasound of the left breast. (Code:[id number]) The patient will be contacted regarding the findings, and additional imaging will be scheduled. BI-RADS CATEGORY  0: Incomplete. Need additional imaging evaluation and/or prior mammograms for comparison.  Electronically Signed   By: Lovey Newcomer M.D.   On: 07/03/2020 12:52   MM CLIP PLACEMENT LEFT  Result Date: 07/17/2020 CLINICAL DATA:  Post ultrasound-guided biopsy of a 0.3 cm mass in the left breast at the 5 o'clock position. EXAM: DIAGNOSTIC LEFT MAMMOGRAM POST ULTRASOUND BIOPSY COMPARISON:  Previous exams. FINDINGS: Mammographic images were obtained following ultrasound guided biopsy of a 0.3 cm mass in the left breast at the 5 o'clock position. A Vision biopsy marking clip is present and located at the posterior margin of the biopsied mass in the left breast at the 5 o'clock position. IMPRESSION: Vision biopsy marking clip at posterior margin of biopsied mass in the left breast at the 5 o'clock position. Final Assessment: Post  Procedure Mammograms for Marker Placement Electronically Signed   By: Everlean Alstrom M.D.   On: 07/17/2020 09:32   [place] LT BREAST BX W LOC DEV 1ST LESION IMG BX SPEC US GUIDE  Addendum Date: 07/21/2020   ADDENDUM REPORT: 07/20/2020 13:32 ADDENDUM: PATHOLOGY revealed: A. BREAST, LEFT 5:00 4 CM FN; ULTRASOUND-GUIDED BIOPSY: - INVASIVE MAMMARY CARCINOMA WITH LOBULAR FEATURES. - IN SITU CARCINOMA, SEE COMMENT. - ATYPICAL LOBULAR HYPERPLASIA. 4 mm in this sample. Grade 2. Lymphovascular invasion: Not identified. Pathology results are CONCORDANT with imaging findings, per Dr. Everlean Alstrom. Pathology results and recommendations below were discussed with patient by telephone on 07/20/2020. Patient reported biopsy site within normal limits with slight tenderness at the site. Post biopsy care instructions were reviewed, questions were answered and my direct phone number was provided to patient. Patient was instructed to call [facility] if any concerns or questions arise related to the biopsy. Recommendation: 1. Request for surgical consultation relayed to Al Pimple RN and Tanya Nones RN at [facility] by Electa Sniff RN on 07/20/2020. 2.  Consider  bilateral breast MRI due to lobular histology. Pathology results reported by Electa Sniff RN on 07/20/2020. Electronically Signed   By: Everlean Alstrom M.D.   On: 07/20/2020 13:32   Addendum Date: 07/17/2020   ADDENDUM REPORT: 07/17/2020 09:34 ADDENDUM: An addendum is made to the procedure portion of the exam due to an error reported in the type of biopsy marking clip placed. This should read as: At the conclusion of the procedure a VISION BIOPSY MARKING CLIP was deployed into the biopsy cavity. Electronically Signed   By: Everlean Alstrom M.D.   On: 07/17/2020 09:34   Result Date: 07/21/2020 CLINICAL DATA:  86 year old female with a suspicious 0.3 cm mass in the left breast at the 5 o'clock position. EXAM: ULTRASOUND GUIDED LEFT BREAST CORE NEEDLE BIOPSY COMPARISON:  Previous exam(s). PROCEDURE: I met with the patient and we discussed the procedure of ultrasound-guided biopsy, including benefits and alternatives. We discussed the high likelihood of a successful procedure. We discussed the risks of the procedure, including infection, bleeding, tissue injury, clip migration, and inadequate sampling. Informed written consent was given. The usual time-out protocol was performed immediately prior to the procedure. Lesion quadrant: Lower outer Using sterile technique and 1% Lidocaine as local anesthetic, under direct ultrasound visualization, a 14 gauge spring-loaded device was used to perform biopsy of the mass in the left breast at the 5 o'clock position using a medial to lateral approach. At the conclusion of the procedure a Q shaped tissue marker clip was deployed into the biopsy cavity. Follow up 2 view mammogram was performed and dictated separately. IMPRESSION: Ultrasound guided biopsy of the mass in the left breast at the 5 o'clock position. No apparent complications. Electronically Signed: By: Everlean Alstrom M.D. On: 07/17/2020 09:25    ASSESSMENT & PLAN:   Carcinoma of lower-outer quadrant of left  breast in female, estrogen receptor positive ([place]) #Clinical stage I- LEFT BREAST INVASIVE MAMMARY CA with  LOBULAR CANCER-receptor status pending.  3 to 4 mm size-on biopsy radiology.  MRI-biopsy artifact clip otherwise no other disease noted.   # I had a long discussion with the patient and daughter in general regarding the treatment options of breast cancer including-surgery; adjuvant radiation; role of adjuvant systemic therapy including-chemotherapy antihormone therapy.   # Patient will likely need lumpectomy with sentinel lymph node evaluation.  Radiation at her age could be considered optional, however await receptor status.  We will also discuss at tumor conference.  #Chemotherapy less likely to be offered given her age/comorbidities/early stage breast cancer.  Patient/daughter are understandably also quite reluctant with chemotherapeutic option.  Again, decision pending at this time while awaiting receptor status.  #A. Fib-on Eliquis/amiodarone-clinically stable.  #History of UTIs- on prophylaxis Kelfex; awaiting evaluation at [facility]  Thank you-  for allowing me to participate in the care of your pleasant patient. Please do not hesitate to contact me with questions or concerns in the interim.  # DISPOSITION: # follow up TBD- Dr.B   ADDENDUM: Spoke to Dr. Peyton Najjar receptor status -interestingly shows ER-LOW [1-10%]; PR- NEG; Her-2 POSITIVE.  However given early stage breast cancer-the treatment recommendation would still be surgery upfront.  Final decisions for systemic therapy would be considered post surgery.will discuss with daughter.   All questions were answered. The patient/family knows to call the clinic with any problems, questions or concerns.    Cammie Sickle, MD 07/24/2020 7:49 AM

## 2020-07-24 ENCOUNTER — Telehealth: Payer: Self-pay | Admitting: Internal Medicine

## 2020-07-24 ENCOUNTER — Other Ambulatory Visit: Payer: Self-pay | Admitting: General Surgery

## 2020-07-24 DIAGNOSIS — C50512 Malignant neoplasm of lower-outer quadrant of left female breast: Secondary | ICD-10-CM

## 2020-07-24 NOTE — Progress Notes (Signed)
Family request change in date of surgery if possible, so elder daughter could be here on that day.  Discussed options with Dr. Windell Moment, and reported possible date changes to daughter Malachy Mood.  Plans to keep surgery on 08/03/20.  Notified Dr. Windell Moment of patient wishes.

## 2020-07-24 NOTE — Telephone Encounter (Signed)
On 10/08-left a voicemail for the patient's daughter [name]-to call us back to discuss regarding breast receptor profile.   I would recommend proceeding with surgery as primary therapeutic option.  Systemic therapy to be decided post surgery.  Patient will likely need radiation-as lumpectomy/SLNBx is planned.   FYI-Dr.Cintron

## 2020-07-27 ENCOUNTER — Telehealth: Payer: Self-pay | Admitting: *Deleted

## 2020-07-27 NOTE — Telephone Encounter (Signed)
Spoke with daughter- 07/27/2020 at [phone]- see rn's phone note

## 2020-07-27 NOTE — Telephone Encounter (Signed)
Spoke with Sheryl Michael, patient's daughter.  Explained to daughter that Dr. B is out of town today, but will be back tomorrow. Discussed the plan of care as outlined by Dr. Aletha Halim phone note. Questions were answered to daughter's satisfaction. She thanked me for calling her back. She will let Dr. B know if she has any further questions or concerns.  On note: she inquired about the necessity to follow-up on the Right middle lobe lung nodules, previously seen on pet scan last year. She stated that it was "too small for Dr. Angelica Chessman to biopsy." They chose not to biopsy the lung nodule at that time due to risk factors for pneumothorax. Daughter would like to discuss the follow-up plan for the lung nodule and whether or not Dr. B feels that the nodule could be possible metastatic breast cancer.I explained to daughter that I would send the msg to Dr. B so that he could follow-up on his clinical question. She thanked me for actively listening to her concerns.

## 2020-07-27 NOTE — Telephone Encounter (Signed)
Daughter retuning Dr Aletha Halim call from Friday

## 2020-07-29 ENCOUNTER — Encounter
Admission: RE | Admit: 2020-07-29 | Discharge: 2020-07-29 | Disposition: A | Discharge status: Home / Self Care | Payer: Medicare Other | Source: Ambulatory Visit | Attending: General Surgery | Admitting: General Surgery

## 2020-07-29 ENCOUNTER — Telehealth: Payer: Self-pay | Admitting: Internal Medicine

## 2020-07-29 ENCOUNTER — Other Ambulatory Visit: Payer: Self-pay

## 2020-07-29 HISTORY — DX: Essential (primary) hypertension: I10

## 2020-07-29 HISTORY — DX: Other complications of anesthesia, initial encounter: T88.59XA

## 2020-07-29 HISTORY — DX: Cardiac arrhythmia, unspecified: I49.9

## 2020-07-29 NOTE — Progress Notes (Addendum)
[facility] Perioperative Services  Pre-Admission/Anesthesia Testing Clinical Review  Date: 07/29/20  Patient Demographics:  Name: Sheryl Michael DOB:   06-Feb-1931 MRN:   [id number]  Planned Surgical Procedure(s):    Case: [id number] Date/Time: 08/03/20 1025   Procedure: PARTIAL MASTECTOMY WITH Radio Frequency tag AND AXILLARY SENTINEL LYMPH NODE BX (Left Breast)   Anesthesia type: General   Pre-op diagnosis: C50.512 Malignant neoplasm of lower outer quadrant of lt female breast, unspecified estrogen receptor status   Location: [facility] OR ROOM 06 / [place]   Surgeons: Herbert Pun, MD     NOTE: Available PAT nursing documentation and vital signs have been reviewed. Clinical nursing staff has updated patient's PMH/PSHx, current medication list, and drug allergies/intolerances to ensure comprehensive history available to assist in medical decision making as it pertains to the aforementioned surgical procedure and anticipated anesthetic course.   Clinical Discussion:  Sheryl Michael is a 86 y.o. female who is submitted for pre-surgical anesthesia review and clearance prior to her undergoing the above procedure. Patient has never been a smoker. Pertinent PMH includes: atrial fibrillation, TIA, HTN, HLD, hypothyroidism (with goiter), GERD (on daily PPI), OA, breast cancer (s/p lumpectomy and XRT), depression (on SSRI).  Patient is followed by cardiology Clayborn Bigness, MD). She was last seen in the cardiology clinic on 06/17/2020; notes reviewed. At the time of her clinic visit, patient was feeling well overall. She denied any angina, however continued to have exertional dyspnea. Patient denied any palpitations, fatigue, peripheral edema, vertiginous symptoms, and presyncope/syncope. PMH (+) for A. fib with RVR for which patient takes daily diltiazem. Patient on GDMT for her HLD using atorvastatin. Recent Holter monitor study revealed an A. fib burden of  13.33% with the predominant rhythm being sinus bradycardia to sinus rhythm; mean heart rate 69 bpm. TTE done in 04/2020 revealed normal LV systolic function with an estimated LVEF of 55-60%. Last myocardial perfusion imaging performed in 2018 revealed no evidence of stress-induced myocardial ischemia or arrhythmia; LVEF at that time was 70% (see full interpretation of cardiovascular testing below). Patient scheduled to follow-up with outpatient cardiology in 4 months.  Patient has been found to have biopsy-proven ER (+), PR (-), HER-2/neu (+) invasive mammary carcinoma with lobular features. She has been seen in consult by general surgery and has elected to undergo partial mastectomy. Patient scheduled to undergo mastectomy and axillary sentinel node biopsy on 08/03/2020 with Dr. Herbert Pun. Given patient's past medical history and cardiac issues, presurgical cardiac clearance was sought by the performing surgeon's office. Per cardiology, "patient is optimized for surgery with a MODERATE risk stratification".  Again, this patient is on daily anticoagulation using apixaban. She has been instructed on her cardiologist recommendations to hold her apixaban for 5 to 7 days prior to her procedure.  She denies previous perioperative complications with anesthesia. She underwent a general anesthetic course here (ASA III) in 11/2018 with no documented complications.   Vitals with BMI 07/29/2020 07/23/2020 07/13/2020  Height 5' 0" 5' 1" 5' 1"  Weight 126 lbs 127 lbs 127 lbs 10 oz  BMI 24.61 86.57 84.69  Systolic - 629 528  Diastolic - 52 53  Pulse - 60 62    Providers/Specialists:   NOTE: Primary physician provider listed below. Patient may have been seen by APP or partner within same practice.   PROVIDER ROLE LAST Tanna Savoy, MD General Surgery 07/21/2020  Idelle Crouch, MD Primary Care Provider 05/22/2020  Katrine Coho, MD  Cardiology 06/17/2020   Allergies:  Bactrim  [sulfamethoxazole-trimethoprim], Iodinated diagnostic agents, Codeine, Erythromycin ethylsuccinate, Phenobarbital, Ciprofloxacin, and Penicillins  Current Home Medications:   . acetaminophen (TYLENOL) 325 MG tablet  . atorvastatin (LIPITOR) 10 MG tablet  . Calcium Carbonate-Vitamin D 600-200 MG-UNIT TABS  . cephALEXin (KEFLEX) 250 MG capsule  . conjugated estrogens (PREMARIN) vaginal cream  . cyanocobalamin (,VITAMIN B-12,) 1000 MCG/ML injection  . diltiazem (CARDIZEM CD) 120 MG 24 hr capsule  . escitalopram (LEXAPRO) 10 MG tablet  . Lactobacillus (PROBIOTIC ACIDOPHILUS) CAPS  . levothyroxine (SYNTHROID, LEVOTHROID) 50 MCG tablet  . omeprazole (PRILOSEC) 20 MG capsule  . apixaban (ELIQUIS) 2.5 MG TABS tablet  . feeding supplement, ENSURE ENLIVE, (ENSURE ENLIVE) LIQD  . pantoprazole (PROTONIX) 40 MG tablet   No current facility-administered medications for this encounter.   History:   Past Medical History:  Diagnosis Date  . Arthritis   . Atrial fibrillation ([place])   . Breast cancer ([place]) 2006   right breast ca with lumpectomy and rad tx  . Colon polyp   . Complication of anesthesia    questions about husband who passed in 2012   . Cystocele   . Depression   . Dysrhythmia   . Female bladder prolapse   . GERD (gastroesophageal reflux disease)   . Goiter   . Hyperlipemia   . Hypertension   . Hypothyroidism   . Osteoporosis   . Personal history of radiation therapy 2006   right breast ca  . Pneumonia   . Procidentia of uterus   . Recurrent UTI   . Reflux   . TIA (transient ischemic attack)   . Vaginal atrophy    Past Surgical History:  Procedure Laterality Date  . APPENDECTOMY    . BREAST BIOPSY Right 2006   breast cancer  . BREAST LUMPECTOMY Right 2006   positive  . BREAST SURGERY Right    lumpectomy  . CATARACT EXTRACTION W/ INTRAOCULAR LENS  IMPLANT, BILATERAL    . COLONOSCOPY    . CYSTOCELE REPAIR N/A 12/21/2015   Procedure: ANTERIOR REPAIR (CYSTOCELE);   Surgeon: Brayton Mars, MD;  Location: [facility] ORS;  Service: Gynecology;  Laterality: N/A;  . DILATION AND CURETTAGE OF UTERUS    . ELECTROMAGNETIC NAVIGATION BROCHOSCOPY Right 12/13/2018   Procedure: ELECTROMAGNETIC NAVIGATION BRONCHOSCOPY;  Surgeon: Flora Lipps, MD;  Location: [facility] ORS;  Service: Cardiopulmonary;  Laterality: Right;  . EYE SURGERY    . VAGINAL HYSTERECTOMY Bilateral 12/21/2015   Procedure: TVH BSO;  Surgeon: Brayton Mars, MD;  Location: [facility] ORS;  Service: Gynecology;  Laterality: Bilateral;   Family History  Problem Relation Age of Onset  . Diabetes Sister   . Breast cancer Sister        late 67's  . Diabetes Brother   . Diabetes Sister    Social History   Tobacco Use  . Smoking status: Never Smoker  . Smokeless tobacco: Never Used  Vaping Use  . Vaping Use: Never used  Substance Use Topics  . Alcohol use: No  . Drug use: No    Pertinent Clinical Results:  LABS: Labs reviewed: Acceptable for surgery.     ECG: Date: 05/04/2020 Time ECG obtained: 1819 PM Rate: 70 bpm Rhythm: normal sinus Axis (leads I and aVF): Normal Intervals: PR 164 ms. QRS 80 ms. QTc 533 ms. ST segment and T wave changes: Nonspecific ST and T wave abnormality; prolonged QTc Comparison: Previous ECG performed on 03/04/2020 showed patient to be in atrial fibrillation  with RVR.    IMAGING / PROCEDURES: ECHOCARDIOGRAM done on 03/05/2020 1. Left ventricular ejection fraction, by estimation, is 60 to 65%. The left ventricle has normal function. The left ventricle has no regional wall motion abnormalities. There is mild left ventricular hypertrophy. Left ventricular diastolic parameters were normal.  2. Right ventricular systolic function is normal. The right ventricular size is normal. There is mildly elevated pulmonary artery systolic pressure. 3. The mitral valve is normal in structure. Trivial mitral valve regurgitation. No evidence of mitral stenosis.  4. The aortic  valve is normal in structure. Aortic valve regurgitation is mild. No aortic stenosis is present. 5. The inferior vena cava is normal in size with greater than 50% respiratory variability, suggesting right atrial pressure of 3 mmHg.   LEXISCAN done on 06/16/2017 1. LVEF 70% 2. Regional wall motion reveals normal myocardial thickening and wall motion 3. The overall quality of the study is good 4. No artifacts noted 5. Normal left ventricular cavity 6. No evidence of stress-induced myocardial ischemia or arrhythmia 7. Overall negative scan  Impression and Plan:  MAYIA MEGILL has been referred for pre-anesthesia review and clearance prior to her undergoing the planned anesthetic and procedural courses. Available labs, pertinent testing, and imaging results were personally reviewed by me. This patient has been appropriately cleared by cardiology.   Based on clinical review performed today (07/29/20), barring any significant acute changes in the patient's overall condition, it is anticipated that shewill be able to proceed with the planned surgical intervention. Any acute changes in clinical condition may necessitate her procedure being postponed and/or cancelled. Pre-surgical instructions were reviewed with the patient during her PAT appointment and questions were fielded by PAT clinical staff.  Honor Loh, MSN, APRN, FNP-C, CEN [facility]  Peri-operative Services Nurse Practitioner Phone: [phone] 07/29/20 3:32 PM  NOTE: This note has been prepared using Dragon dictation software. Despite my best ability to proofread, there is always the potential that unintentional transcriptional errors may still occur from this process.

## 2020-07-29 NOTE — Patient Instructions (Signed)
Your procedure is scheduled on: Monday August 03, 2020. Report to Day Surgery inside [place] 2nd floor. To find out your arrival time please call [phone] between 1PM - 3PM on Friday July 31, 2020.  Remember: Instructions that are not followed completely may result in serious medical risk,  up to and including death, or upon the discretion of your surgeon and anesthesiologist your  surgery may need to be rescheduled.     _X__ 1. Do not eat food after midnight the night before your procedure.                 No chewing gum or hard candies. You may drink clear liquids up to 2 hours                 before you are scheduled to arrive for your surgery- DO not drink clear                 liquids within 2 hours of the start of your surgery.                 Clear Liquids include:  water, apple juice without pulp, clear Gatorade, G2 or                  Gatorade Zero (avoid Red/Purple/Blue), Black Coffee or Tea (Do not add                 anything to coffee or tea).  __X__2.  On the morning of surgery brush your teeth with toothpaste and water, you                may rinse your mouth with mouthwash if you wish.  Do not swallow any toothpaste of mouthwash.     _X__ 3.  No Alcohol for 24 hours before or after surgery.   _X__ 4.  Do Not Smoke or use e-cigarettes For 24 Hours Prior to Your Surgery.                 Do not use any chewable tobacco products for at least 6 hours prior to                 Surgery.  _X__  5.  Do not use any recreational drugs (marijuana, cocaine, heroin, ecstasy, MDMA or other)                For at least one week prior to your surgery.  Combination of these drugs with anesthesia                May have life threatening results.  __x__ 6.  Notify your doctor if there is any change in your medical condition      (cold, fever, infections).     Do not wear jewelry, make-up, hairpins, clips or nail polish. Do not wear lotions, powders,  or perfumes. You may wear deodorant. Do not shave 48 hours prior to surgery. Men may shave face and neck. Do not bring valuables to the hospital.    [facility] is not responsible for any belongings or valuables.  Contacts, dentures or bridgework may not be worn into surgery. Leave your suitcase in the car. After surgery it may be brought to your room. For patients admitted to the hospital, discharge time is determined by your treatment team.   Patients discharged the day of surgery will not be allowed to drive home.   Make arrangements for someone to be  with you for the first 24 hours of your Same Day Discharge.   __x__ Take these medicines the morning of surgery with A SIP OF WATER:    1. diltiazem (CARDIZEM CD) 120 MG  2. levothyroxine (SYNTHROID, LEVOTHROID) 50 MCG  3. omeprazole (PRILOSEC) 20 MG   4. escitalopram (LEXAPRO) 10 MG   __x__ Use CHG Soap as directed  ____ Use Benzoyl Peroxide Gel as instructed  ____ Use inhalers on the day of surgery  ____ Stop metformin 2 days prior to surgery    ____ Take 1/2 of usual insulin dose the night before surgery. No insulin the morning          of surgery.   __x__ Stop apixaban (ELIQUIS) 2.5 MG as instructed by provider.  __x__ Stop Anti-inflammatories such as Ibuprofen, Aleve, Advil, naproxen, aspirin and or BC powders.   __x__ Stop supplements until after surgery.    __x__ Do not start any herbal supplements before your procedure.     If you have any questions regarding your pre-procedure instructions,  Please call Pre-admit Testing at [phone].

## 2020-07-29 NOTE — Telephone Encounter (Signed)
On 10/12-spoke to patient's daughter re: results of the ER week positive; PR negative HER-2/neu positive.  Proceed with surgery upfront.  We will decide on the systemic therapy based upon final surgical pathology.  Daughter concerned about patient history of frequent UTIs.  I will reach out to urology regarding her concerns.   GB

## 2020-07-30 ENCOUNTER — Ambulatory Visit
Admission: RE | Admit: 2020-07-30 | Discharge: 2020-07-30 | Disposition: A | Discharge status: Home / Self Care | Payer: Medicare Other | Source: Ambulatory Visit | Attending: General Surgery | Admitting: General Surgery

## 2020-07-30 ENCOUNTER — Other Ambulatory Visit
Admission: RE | Admit: 2020-07-30 | Discharge: 2020-07-30 | Disposition: A | Discharge status: Home / Self Care | Payer: Medicare Other | Source: Ambulatory Visit | Attending: General Surgery | Admitting: General Surgery

## 2020-07-30 DIAGNOSIS — Z20822 Contact with and (suspected) exposure to covid-19: Secondary | ICD-10-CM | POA: Diagnosis not present

## 2020-07-30 DIAGNOSIS — C50512 Malignant neoplasm of lower-outer quadrant of left female breast: Secondary | ICD-10-CM | POA: Diagnosis not present

## 2020-07-30 LAB — SARS CORONAVIRUS 2 (TAT 6-24 HRS): SARS Coronavirus 2: NEGATIVE

## 2020-08-03 ENCOUNTER — Ambulatory Visit: Payer: Medicare Other | Admitting: Certified Registered"

## 2020-08-03 ENCOUNTER — Other Ambulatory Visit: Payer: Self-pay

## 2020-08-03 ENCOUNTER — Encounter
Admission: RE | Disposition: A | Discharge status: Home / Self Care | Payer: Self-pay | Source: Home / Self Care | Attending: General Surgery

## 2020-08-03 ENCOUNTER — Ambulatory Visit
Admission: RE | Admit: 2020-08-03 | Discharge: 2020-08-03 | Disposition: A | Discharge status: Home / Self Care | Payer: Medicare Other | Source: Ambulatory Visit | Attending: General Surgery | Admitting: General Surgery

## 2020-08-03 ENCOUNTER — Ambulatory Visit
Admission: RE | Admit: 2020-08-03 | Discharge: 2020-08-03 | Disposition: A | Discharge status: Home / Self Care | Payer: Medicare Other | Attending: General Surgery | Admitting: General Surgery

## 2020-08-03 ENCOUNTER — Ambulatory Visit: Payer: Medicare Other | Admitting: Urgent Care

## 2020-08-03 ENCOUNTER — Encounter: Payer: Self-pay | Admitting: General Surgery

## 2020-08-03 ENCOUNTER — Telehealth: Payer: Self-pay

## 2020-08-03 DIAGNOSIS — C50512 Malignant neoplasm of lower-outer quadrant of left female breast: Secondary | ICD-10-CM

## 2020-08-03 DIAGNOSIS — Z170 Estrogen receptor positive status [ER+]: Secondary | ICD-10-CM | POA: Insufficient documentation

## 2020-08-03 DIAGNOSIS — E039 Hypothyroidism, unspecified: Secondary | ICD-10-CM | POA: Insufficient documentation

## 2020-08-03 DIAGNOSIS — Z8249 Family history of ischemic heart disease and other diseases of the circulatory system: Secondary | ICD-10-CM | POA: Insufficient documentation

## 2020-08-03 DIAGNOSIS — I480 Paroxysmal atrial fibrillation: Secondary | ICD-10-CM | POA: Insufficient documentation

## 2020-08-03 DIAGNOSIS — Z803 Family history of malignant neoplasm of breast: Secondary | ICD-10-CM | POA: Diagnosis not present

## 2020-08-03 DIAGNOSIS — Z881 Allergy status to other antibiotic agents status: Secondary | ICD-10-CM | POA: Diagnosis not present

## 2020-08-03 DIAGNOSIS — Z923 Personal history of irradiation: Secondary | ICD-10-CM | POA: Diagnosis not present

## 2020-08-03 DIAGNOSIS — Z882 Allergy status to sulfonamides status: Secondary | ICD-10-CM | POA: Insufficient documentation

## 2020-08-03 DIAGNOSIS — I10 Essential (primary) hypertension: Secondary | ICD-10-CM | POA: Diagnosis not present

## 2020-08-03 DIAGNOSIS — Z8673 Personal history of transient ischemic attack (TIA), and cerebral infarction without residual deficits: Secondary | ICD-10-CM | POA: Diagnosis not present

## 2020-08-03 DIAGNOSIS — Z885 Allergy status to narcotic agent status: Secondary | ICD-10-CM | POA: Diagnosis not present

## 2020-08-03 DIAGNOSIS — Z7901 Long term (current) use of anticoagulants: Secondary | ICD-10-CM | POA: Insufficient documentation

## 2020-08-03 DIAGNOSIS — Z79890 Hormone replacement therapy (postmenopausal): Secondary | ICD-10-CM | POA: Insufficient documentation

## 2020-08-03 DIAGNOSIS — Z853 Personal history of malignant neoplasm of breast: Secondary | ICD-10-CM | POA: Insufficient documentation

## 2020-08-03 DIAGNOSIS — Z79899 Other long term (current) drug therapy: Secondary | ICD-10-CM | POA: Diagnosis not present

## 2020-08-03 DIAGNOSIS — K219 Gastro-esophageal reflux disease without esophagitis: Secondary | ICD-10-CM | POA: Insufficient documentation

## 2020-08-03 DIAGNOSIS — Z8349 Family history of other endocrine, nutritional and metabolic diseases: Secondary | ICD-10-CM | POA: Diagnosis not present

## 2020-08-03 HISTORY — PX: PARTIAL MASTECTOMY WITH NEEDLE LOCALIZATION AND AXILLARY SENTINEL LYMPH NODE BX: SHX6009

## 2020-08-03 HISTORY — PX: BREAST LUMPECTOMY: SHX2

## 2020-08-03 SURGERY — PARTIAL MASTECTOMY WITH NEEDLE LOCALIZATION AND AXILLARY SENTINEL LYMPH NODE BX
Anesthesia: General | Site: Breast | Laterality: Left

## 2020-08-03 MED ORDER — BUPIVACAINE-EPINEPHRINE (PF) 0.5% -1:200000 IJ SOLN
INTRAMUSCULAR | Status: AC
  Filled 2020-08-03: qty 90

## 2020-08-03 MED ORDER — CEFAZOLIN SODIUM-DEXTROSE 2-4 GM/100ML-% IV SOLN
INTRAVENOUS | Status: AC
  Filled 2020-08-03: qty 100

## 2020-08-03 MED ORDER — LIDOCAINE HCL (PF) 2 % IJ SOLN
INTRAMUSCULAR | Status: AC
  Filled 2020-08-03: qty 5

## 2020-08-03 MED ORDER — ONDANSETRON HCL 4 MG/2ML IJ SOLN
INTRAMUSCULAR | Status: AC
  Filled 2020-08-03: qty 2

## 2020-08-03 MED ORDER — ONDANSETRON HCL 4 MG PO TABS: 4.0000 mg | ORAL_TABLET | Freq: Every day | ORAL | 1 refills | Status: DC | PRN

## 2020-08-03 MED ORDER — CHLORHEXIDINE GLUCONATE 0.12 % MT SOLN: 15.0000 mL | Freq: Once | OROMUCOSAL | Status: AC

## 2020-08-03 MED ORDER — GLYCOPYRROLATE 0.2 MG/ML IJ SOLN
INTRAMUSCULAR | Status: DC | PRN
  Administered 2020-08-03: .2 mg via INTRAVENOUS

## 2020-08-03 MED ORDER — DEXAMETHASONE SODIUM PHOSPHATE 10 MG/ML IJ SOLN
INTRAMUSCULAR | Status: AC
  Filled 2020-08-03: qty 1

## 2020-08-03 MED ORDER — BUPIVACAINE-EPINEPHRINE (PF) 0.5% -1:200000 IJ SOLN
INTRAMUSCULAR | Status: DC | PRN
  Administered 2020-08-03: 10 mL
  Administered 2020-08-03: 20 mL

## 2020-08-03 MED ORDER — METHYLENE BLUE 0.5 % INJ SOLN
INTRAVENOUS | Status: AC
  Filled 2020-08-03: qty 10

## 2020-08-03 MED ORDER — LIDOCAINE HCL (CARDIAC) PF 100 MG/5ML IV SOSY
PREFILLED_SYRINGE | INTRAVENOUS | Status: DC | PRN
  Administered 2020-08-03: 60 mg via INTRAVENOUS

## 2020-08-03 MED ORDER — BUPIVACAINE-EPINEPHRINE (PF) 0.25% -1:200000 IJ SOLN
INTRAMUSCULAR | Status: AC
  Filled 2020-08-03: qty 30

## 2020-08-03 MED ORDER — FENTANYL CITRATE (PF) 100 MCG/2ML IJ SOLN: 25.0000 ug | INTRAMUSCULAR | Status: DC | PRN

## 2020-08-03 MED ORDER — FENTANYL CITRATE (PF) 100 MCG/2ML IJ SOLN
INTRAMUSCULAR | Status: AC
  Filled 2020-08-03: qty 2

## 2020-08-03 MED ORDER — ACETAMINOPHEN 10 MG/ML IV SOLN
INTRAVENOUS | Status: DC | PRN
  Administered 2020-08-03: 1000 mg via INTRAVENOUS

## 2020-08-03 MED ORDER — TRAMADOL HCL 50 MG PO TABS: 50.0000 mg | ORAL_TABLET | Freq: Four times a day (QID) | ORAL | 0 refills | Status: DC | PRN

## 2020-08-03 MED ORDER — LACTATED RINGERS IV SOLN: INTRAVENOUS | Status: DC

## 2020-08-03 MED ORDER — GLYCOPYRROLATE 0.2 MG/ML IJ SOLN
INTRAMUSCULAR | Status: AC
  Filled 2020-08-03: qty 1

## 2020-08-03 MED ORDER — SUCCINYLCHOLINE CHLORIDE 200 MG/10ML IV SOSY
PREFILLED_SYRINGE | INTRAVENOUS | Status: AC
  Filled 2020-08-03: qty 10

## 2020-08-03 MED ORDER — CEFAZOLIN SODIUM-DEXTROSE 2-4 GM/100ML-% IV SOLN
2.0000 g | INTRAVENOUS | Status: AC
  Administered 2020-08-03: 2 g via INTRAVENOUS

## 2020-08-03 MED ORDER — ONDANSETRON HCL 4 MG/2ML IJ SOLN: 4.0000 mg | Freq: Once | INTRAMUSCULAR | Status: DC | PRN

## 2020-08-03 MED ORDER — PROPOFOL 10 MG/ML IV BOLUS
INTRAVENOUS | Status: DC | PRN
  Administered 2020-08-03: 30 mg via INTRAVENOUS
  Administered 2020-08-03: 100 mg via INTRAVENOUS
  Administered 2020-08-03: 50 mg via INTRAVENOUS

## 2020-08-03 MED ORDER — ORAL CARE MOUTH RINSE: 15.0000 mL | Freq: Once | OROMUCOSAL | Status: AC

## 2020-08-03 MED ORDER — DEXAMETHASONE SODIUM PHOSPHATE 10 MG/ML IJ SOLN
INTRAMUSCULAR | Status: DC | PRN
  Administered 2020-08-03: 10 mg via INTRAVENOUS

## 2020-08-03 MED ORDER — FENTANYL CITRATE (PF) 100 MCG/2ML IJ SOLN
INTRAMUSCULAR | Status: DC | PRN
Start: 2020-08-03 — End: 2020-08-03
  Administered 2020-08-03 (×4): 25 ug via INTRAVENOUS

## 2020-08-03 MED ORDER — MIDAZOLAM HCL 2 MG/2ML IJ SOLN
INTRAMUSCULAR | Status: AC
  Filled 2020-08-03: qty 2

## 2020-08-03 MED ORDER — CHLORHEXIDINE GLUCONATE 0.12 % MT SOLN
OROMUCOSAL | Status: AC
  Administered 2020-08-03: 15 mL via OROMUCOSAL
  Filled 2020-08-03: qty 15

## 2020-08-03 MED ORDER — ACETAMINOPHEN 10 MG/ML IV SOLN
INTRAVENOUS | Status: AC
  Filled 2020-08-03: qty 100

## 2020-08-03 MED ORDER — METHYLENE BLUE 0.5 % INJ SOLN
INTRAVENOUS | Status: DC | PRN
  Administered 2020-08-03: 8 mL via INTRADERMAL

## 2020-08-03 MED ORDER — TECHNETIUM TC 99M SULFUR COLLOID FILTERED
1.0000 | Freq: Once | INTRAVENOUS | Status: AC | PRN
  Administered 2020-08-03: 0.637 via INTRADERMAL

## 2020-08-03 SURGICAL SUPPLY — 53 items
ADH SKN CLS APL DERMABOND .7 (GAUZE/BANDAGES/DRESSINGS) ×1
APL PRP STRL LF DISP 70% ISPRP (MISCELLANEOUS) ×1
BINDER BREAST LRG (GAUZE/BANDAGES/DRESSINGS) IMPLANT
BINDER BREAST MEDIUM (GAUZE/BANDAGES/DRESSINGS) IMPLANT
BINDER BREAST XLRG (GAUZE/BANDAGES/DRESSINGS) IMPLANT
BINDER BREAST XXLRG (GAUZE/BANDAGES/DRESSINGS) IMPLANT
BLADE SURG 15 STRL LF DISP TIS (BLADE) ×2 IMPLANT
BLADE SURG 15 STRL SS (BLADE) ×6
CANISTER SUCT 1200ML W/VALVE (MISCELLANEOUS) IMPLANT
CHLORAPREP W/TINT 26 (MISCELLANEOUS) ×3 IMPLANT
CNTNR SPEC 2.5X3XGRAD LEK (MISCELLANEOUS)
CONT SPEC 4OZ STER OR WHT (MISCELLANEOUS)
CONT SPEC 4OZ STRL OR WHT (MISCELLANEOUS)
CONTAINER SPEC 2.5X3XGRAD LEK (MISCELLANEOUS) IMPLANT
COVER WAND RF STERILE (DRAPES) ×3 IMPLANT
DERMABOND ADVANCED (GAUZE/BANDAGES/DRESSINGS) ×2
DERMABOND ADVANCED .7 DNX12 (GAUZE/BANDAGES/DRESSINGS) ×1 IMPLANT
DEVICE DUBIN SPECIMEN MAMMOGRA (MISCELLANEOUS) ×3 IMPLANT
DRAPE LAPAROTOMY TRNSV 106X77 (MISCELLANEOUS) ×3 IMPLANT
DRSG GAUZE FLUFF 36X18 (GAUZE/BANDAGES/DRESSINGS) IMPLANT
ELECT CAUTERY BLADE 6.4 (BLADE) ×3 IMPLANT
ELECT REM PT RETURN 9FT ADLT (ELECTROSURGICAL) ×3
ELECTRODE REM PT RTRN 9FT ADLT (ELECTROSURGICAL) ×1 IMPLANT
GLOVE BIO SURGEON STRL SZ 6.5 (GLOVE) ×6 IMPLANT
GLOVE BIO SURGEONS STRL SZ 6.5 (GLOVE) ×3
GLOVE BIOGEL PI IND STRL 6.5 (GLOVE) ×3 IMPLANT
GLOVE BIOGEL PI INDICATOR 6.5 (GLOVE) ×6
GOWN STRL REUS W/ TWL LRG LVL3 (GOWN DISPOSABLE) ×2 IMPLANT
GOWN STRL REUS W/TWL LRG LVL3 (GOWN DISPOSABLE) ×6
KIT MARKER MARGIN INK (KITS) ×3 IMPLANT
KIT TURNOVER KIT A (KITS) ×3 IMPLANT
LABEL OR SOLS (LABEL) ×3 IMPLANT
MANIFOLD NEPTUNE II (INSTRUMENTS) ×3 IMPLANT
MARGIN MAP 10MM (MISCELLANEOUS) ×3 IMPLANT
MARKER MARGIN CORRECT CLIP (MARKER) ×3 IMPLANT
NEEDLE HYPO 22GX1.5 SAFETY (NEEDLE) ×6 IMPLANT
NEEDLE HYPO 25X1 1.5 SAFETY (NEEDLE) ×3 IMPLANT
PACK BASIN MINOR (MISCELLANEOUS) ×3 IMPLANT
RETRACTOR RING XSMALL (MISCELLANEOUS) ×1 IMPLANT
RTRCTR WOUND ALEXIS 13CM XS SH (MISCELLANEOUS) ×3
SET LOCALIZER 20 PROBE US (MISCELLANEOUS) ×3 IMPLANT
SLEVE PROBE SENORX GAMMA FIND (MISCELLANEOUS) ×3 IMPLANT
SUT ETHILON 3-0 FS-10 30 BLK (SUTURE) ×3
SUT MNCRL 4-0 (SUTURE) ×6
SUT MNCRL 4-0 27XMFL (SUTURE) ×2
SUT SILK 2 0 SH (SUTURE) ×3 IMPLANT
SUT VIC AB 3-0 SH 27 (SUTURE) ×6
SUT VIC AB 3-0 SH 27X BRD (SUTURE) ×2 IMPLANT
SUTURE EHLN 3-0 FS-10 30 BLK (SUTURE) ×1 IMPLANT
SUTURE MNCRL 4-0 27XMF (SUTURE) ×2 IMPLANT
SYR 10ML LL (SYRINGE) ×9 IMPLANT
SYR BULB IRRIG 60ML STRL (SYRINGE) ×3 IMPLANT
WATER STERILE IRR 1000ML POUR (IV SOLUTION) ×3 IMPLANT

## 2020-08-03 NOTE — Anesthesia Preprocedure Evaluation (Signed)
Anesthesia Evaluation  Patient identified by MRN, date of birth, ID band Patient awake    Reviewed: Allergy & Precautions, NPO status , Patient's Chart, lab work & pertinent test results  History of Anesthesia Complications Negative for: history of anesthetic complications  Airway Mallampati: II       Dental   Pulmonary neg sleep apnea, neg COPD, Not current smoker,           Cardiovascular hypertension, Pt. on medications (-) Past MI and (-) CHF + dysrhythmias Atrial Fibrillation (-) Valvular Problems/Murmurs     Neuro/Psych neg Seizures Depression TIA (Arm weakness, resolved quickly)   GI/Hepatic Neg liver ROS, GERD  Medicated and Controlled,  Endo/Other  neg diabetesHypothyroidism   Renal/GU Renal InsufficiencyRenal disease     Musculoskeletal   Abdominal   Peds  Hematology   Anesthesia Other Findings   Reproductive/Obstetrics                             Anesthesia Physical Anesthesia Plan  ASA: III  Anesthesia Plan: General   Post-op Pain Management:    Induction: Intravenous  PONV Risk Score and Plan: 3 and Dexamethasone, Ondansetron and Treatment may vary due to age or medical condition  Airway Management Planned: Oral ETT and LMA  Additional Equipment:   Intra-op Plan:   Post-operative Plan:   Informed Consent: I have reviewed the patients History and Physical, chart, labs and discussed the procedure including the risks, benefits and alternatives for the proposed anesthesia with the patient or authorized representative who has indicated his/her understanding and acceptance.       Plan Discussed with:   Anesthesia Plan Comments:         Anesthesia Quick Evaluation

## 2020-08-03 NOTE — Discharge Instructions (Signed)
AMBULATORY SURGERY  DISCHARGE INSTRUCTIONS   1) The drugs that you were given will stay in your system until tomorrow so for the next 24 hours you should not:  A) Drive an automobile B) Make any legal decisions C) Drink any alcoholic beverage   2) You may resume regular meals tomorrow.  Today it is better to start with liquids and gradually work up to solid foods.  You may eat anything you prefer, but it is better to start with liquids, then soup and crackers, and gradually work up to solid foods.   3) Please notify your doctor immediately if you have any unusual bleeding, trouble breathing, redness and pain at the surgery site, drainage, fever, or pain not relieved by medication.    4) Additional Instructions:        Please contact your physician with any problems or Same Day Surgery at [phone], Monday through Friday 6 am to 4 pm, or [place] [facility] number at [phone]. Diet: Resume home heart healthy regular diet.   Activity: No heavy lifting >20 pounds (children, pets, laundry, garbage) or strenuous activity until follow-up, but light activity and walking are encouraged. Do not drive or drink alcohol if taking narcotic pain medications.  Wound care: May shower with soapy water and pat dry (do not rub incisions), but no baths or submerging incision underwater until follow-up. (no swimming)   Medications: Resume all home medications. For mild to moderate pain: acetaminophen (Tylenol) or ibuprofen (if no kidney disease). Combining Tylenol with alcohol can substantially increase your risk of causing liver disease. Narcotic pain medications, if prescribed, can be used for severe pain, though may cause nausea, constipation, and drowsiness. If you do not need the narcotic pain medication, you do not need to fill the prescription.  Call office [phone]) at any time if any questions, worsening pain, fevers/chills, bleeding, drainage from incision site, or  other concerns.

## 2020-08-03 NOTE — Op Note (Signed)
Preoperative diagnosis: Left breast carcinoma.  Postoperative diagnosis: Left breast carcinoma.   Procedure: Left radiofrequency tag-localized partial mastectomy.                       Left Axillary Sentinel Lymph node biopsy  Anesthesia: GETA  Surgeon: Dr. Windell Moment  Wound Classification: Clean  Indications: Patient is a 86 y.o. female with a nonpalpable left breast mass noted on mammography with core biopsy demonstrating invasive lobular carcinoma requires radiofrequency tag-localized partial mastectomy for treatment with sentinel lymph node biopsy.   Findings: 1. Specimen mammography shows marker and tag on specimen 2. Pathology call refers gross examination of margins was grossly negative 3. No other palpable mass or lymph node identified.   Description of procedure: Preoperative radiofrequency tag localization was performed by radiology. In the nuclear medicine suite, the subareolar region was injected with Tc-99 sulfur colloid. Localization studies were reviewed. The patient was taken to the operating room and placed supine on the operating table, and after general anesthesia the left chest and axilla were prepped and draped in the usual sterile fashion. A time-out was completed verifying correct patient, procedure, site, positioning, and implant(s) and/or special equipment prior to beginning this procedure. I personally injected methylene blue dye for intra operative identification of lymph nodes.   By comparing the localization studies and interrogation with Localizer device, the probable trajectory and location of the mass was visualized. A circumareolar skin incision was planned in such a way as to minimize the amount of dissection to reach the mass.  The skin incision was made. Flaps were raised and the location of the tag was confirmed with Localizer device confirmed. A 2-0 silk figure-of-eight stay suture was placed and used for retraction. Dissection was then taken down  circumferentially, taking care to include the entire localizing tag and a wide margin of grossly normal tissue. The specimen and entire localizing tag were removed. The specimen was oriented and sent to radiology with the localization studies. Confirmation was received that the entire target lesion had been resected. The wound was irrigated. Hemostasis was checked. The wound was closed with interrupted sutures of 3-0 Vicryl and a subcuticular suture of Monocryl 3-0. No attempt was made to close the dead space.   A hand-held gamma probe was used to identify the location of the hottest spot in the axilla. An incision was made around the caudal axillary hairline. Dissection was carried down until subdermal facias was advanced. The probe was placed and again, the point of maximal count was found. Dissection continue until nodule was identified. The probe was placed in contact with the node. The node was excised in its entirety.  An additional hot spot was detected and the node was excised in similar fashion. No additional hot spots were identified. No clinically abnormal nodes were palpated. The procedure was terminated. Hemostasis was achieved and the wound closed in layers with deep interrupted 3-0 Vicryl and skin was closed with subcuticular suture of Monocryl 3-0.  The patient tolerated the procedure well and was taken to the postanesthesia care unit in stable condition.   Sentinel Node Biopsy Synoptic Operative Report  Operation performed with curative intent:Yes  Tracer(s) used to identify sentinel nodes in the upfront surgery (non-neoadjuvant) setting (select all that apply):Dye and Radioactive Tracer  Tracer(s) used to identify sentinel nodes in the neoadjuvant setting (select all that apply):N/A  All nodes (colored or non-colored) present at the end of a dye-filled lymphatic channel were removed:Yes  All significantly radioactive nodes were removed:Yes  All palpable suspicious nodes were  removed:N/A  Biopsy-proven positive nodes marked with clips prior to chemotherapy were identified and removed:N/A   Specimen: Left Breast mass                     Sentinel Lymph nodes  Complications: None  Estimated Blood Loss: 10 mL

## 2020-08-03 NOTE — Anesthesia Procedure Notes (Signed)
Procedure Name: Intubation Date/Time: 08/03/2020 2:15 PM Performed by: Kelton Pillar, CRNA Pre-anesthesia Checklist: Patient identified, Emergency Drugs available, Suction available and Patient being monitored Patient Re-evaluated:Patient Re-evaluated prior to induction Oxygen Delivery Method: Circle system utilized Preoxygenation: Pre-oxygenation with 100% oxygen Induction Type: IV induction Ventilation: Mask ventilation without difficulty Laryngoscope Size: McGraph and 3 Grade View: Grade I Tube type: Oral Tube size: 6.5 mm Number of attempts: 1 Airway Equipment and Method: Stylet Placement Confirmation: ETT inserted through vocal cords under direct vision,  positive ETCO2,  CO2 detector and breath sounds checked- equal and bilateral Secured at: 20 cm Tube secured with: Tape Dental Injury: Teeth and Oropharynx as per pre-operative assessment

## 2020-08-03 NOTE — Telephone Encounter (Signed)
-----   Message from Bjorn Loser, MD sent at 08/03/2020  8:49 AM EDT ----- Please say once day will be fine and should protect her No issues with surgery

## 2020-08-03 NOTE — Telephone Encounter (Signed)
sw patient daughter she understands.

## 2020-08-03 NOTE — Interval H&P Note (Signed)
History and Physical Interval Note:  08/03/2020 1:22 PM  Sheryl Michael  has presented today for surgery, with the diagnosis of C50.512 Malignant neoplasm of lower outer quadrant of lt female breast, unspecified estrogen receptor status.  The various methods of treatment have been discussed with the patient and family. After consideration of risks, benefits and other options for treatment, the patient has consented to  Procedure(s): PARTIAL MASTECTOMY WITH Radio Frequency tag AND AXILLARY SENTINEL LYMPH NODE BX (Left) as a surgical intervention.  The patient's history has been reviewed, patient examined, no change in status, stable for surgery.  I have reviewed the patient's chart and labs.  Left breast marked in the pre procedure room. Questions were answered to the patient's satisfaction.     Herbert Pun

## 2020-08-03 NOTE — Transfer of Care (Signed)
Immediate Anesthesia Transfer of Care Note  Patient: Sheryl Michael  Procedure(s) Performed: PARTIAL MASTECTOMY WITH Radio Frequency tag AND AXILLARY SENTINEL LYMPH NODE BX (Left Breast)  Patient Location: PACU  Anesthesia Type:General  Level of Consciousness: drowsy, patient cooperative and responds to stimulation  Airway & Oxygen Therapy: Patient Spontanous Breathing and Patient connected to face mask oxygen  Post-op Assessment: Report given to RN and Post -op Vital signs reviewed and stable  Post vital signs: Reviewed and stable  Last Vitals:  Vitals Value Taken Time  BP 138/54 08/03/20 1536  Temp 36.9 C 08/03/20 1536  Pulse 83 08/03/20 1541  Resp 16 08/03/20 1541  SpO2 100 % 08/03/20 1541  Vitals shown include unvalidated device data.  Last Pain:  Vitals:   08/03/20 1536  TempSrc:   PainSc: Asleep         Complications: No complications documented.

## 2020-08-03 NOTE — Anesthesia Procedure Notes (Signed)
Procedure Name: LMA Insertion Performed by: Kelton Pillar, CRNA Pre-anesthesia Checklist: Patient identified, Emergency Drugs available, Suction available and Patient being monitored Patient Re-evaluated:Patient Re-evaluated prior to induction Oxygen Delivery Method: Circle system utilized Preoxygenation: Pre-oxygenation with 100% oxygen Induction Type: IV induction LMA: LMA inserted LMA Size: 3.0 Placement Confirmation: positive ETCO2,  CO2 detector and breath sounds checked- equal and bilateral Tube secured with: Tape Dental Injury: Teeth and Oropharynx as per pre-operative assessment

## 2020-08-04 ENCOUNTER — Encounter: Payer: Self-pay | Admitting: General Surgery

## 2020-08-04 NOTE — Anesthesia Postprocedure Evaluation (Signed)
Anesthesia Post Note  Patient: Sheryl Michael  Procedure(s) Performed: PARTIAL MASTECTOMY WITH Radio Frequency tag AND AXILLARY SENTINEL LYMPH NODE BX (Left Breast)  Patient location during evaluation: PACU Anesthesia Type: General Level of consciousness: awake and alert and oriented Pain management: pain level controlled Vital Signs Assessment: post-procedure vital signs reviewed and stable Respiratory status: spontaneous breathing Cardiovascular status: blood pressure returned to baseline Anesthetic complications: no   No complications documented.   Last Vitals:  Vitals:   08/03/20 1621 08/03/20 1653  BP: 126/62 (!) 114/54  Pulse: 85 81  Resp: 18 16  Temp:  36.9 C  SpO2: 100% 97%    Last Pain:  Vitals:   08/03/20 1653  TempSrc: Temporal  PainSc: 0-No pain                 Octavius Shin

## 2020-08-05 ENCOUNTER — Other Ambulatory Visit: Payer: Self-pay

## 2020-08-05 ENCOUNTER — Inpatient Hospital Stay (HOSPITAL_BASED_OUTPATIENT_CLINIC_OR_DEPARTMENT_OTHER): Payer: Medicare Other | Admitting: Hospice and Palliative Medicine

## 2020-08-05 DIAGNOSIS — C50512 Malignant neoplasm of lower-outer quadrant of left female breast: Secondary | ICD-10-CM

## 2020-08-05 DIAGNOSIS — Z171 Estrogen receptor negative status [ER-]: Secondary | ICD-10-CM

## 2020-08-05 NOTE — Progress Notes (Signed)
Multidisciplinary Oncology Council Documentation  Sheryl Michael was presented by our [facility] on 08/05/2020, which included representatives from:  . Palliative Care . Dietitian . Physical/Occupational Therapist . Speech Therapist . Survivorship Nurse . Nurse Navigator . Research RN . Sheryl Michael currently presents with history of breast cancer  We reviewed previous medical and familial history, history of present illness, and recent lab results along with all available histopathologic and imaging studies. [facility] [place] considered available treatment options and made the following recommendations/referrals: Orders Placed This Encounter  Procedures  . Ambulatory referral to [facility], OT to follow.    The MOC is a meeting of clinicians from various specialty areas who evaluate and discuss patients for whom a multidisciplinary approach is being considered. Final determinations in the plan of care are those of the provider(s).   Today's extended care, comprehensive team conference, [name] was not present for the discussion and was not examined.

## 2020-08-06 ENCOUNTER — Other Ambulatory Visit: Payer: Self-pay | Admitting: Anatomic Pathology & Clinical Pathology

## 2020-08-10 ENCOUNTER — Encounter: Payer: Self-pay | Admitting: Urology

## 2020-08-10 ENCOUNTER — Other Ambulatory Visit: Payer: Self-pay

## 2020-08-10 ENCOUNTER — Telehealth: Payer: Self-pay | Admitting: Urology

## 2020-08-10 ENCOUNTER — Ambulatory Visit (INDEPENDENT_AMBULATORY_CARE_PROVIDER_SITE_OTHER): Payer: Medicare Other | Admitting: Urology

## 2020-08-10 VITALS — BP 116/62 | HR 66 | Ht 60.0 in | Wt 127.0 lb

## 2020-08-10 DIAGNOSIS — N3020 Other chronic cystitis without hematuria: Secondary | ICD-10-CM

## 2020-08-10 DIAGNOSIS — N390 Urinary tract infection, site not specified: Secondary | ICD-10-CM

## 2020-08-10 LAB — BLADDER SCAN AMB NON-IMAGING: Scan Result: 4

## 2020-08-10 NOTE — Telephone Encounter (Signed)
Added patient to Dr. Mikle Bosworth schedule today at 2:30pm.

## 2020-08-10 NOTE — Progress Notes (Signed)
08/10/2020 2:49 PM   Sheryl Michael Mar 11, 1931 [id number]  Referring provider: Idelle Crouch, MD [place] [facility],  [place] [address]  Chief Complaint  Patient presents with  . Recurrent UTI    HPI: NP consult:Elderly patient with grade 3 cystocele who had an anterior repair in 2017 followed by [facility]. Has medical comorbidities on Eliquis. Has had multiple positive urine cultures. Recent CT scan normal kidneys.Failed pessary fitting and vaginectomy was recommended with cardiac clearance.Recently admitted for urosepsis. Patient was concerned about daily prophylaxis and resistance. Given estrogen cream at meatus to reduce infections.  Patient feels vaginal bulging. She sometimes reduces it. She has had a hysterectomy. Some of the details of the history were sometimes challenging  She leaks with coughing sneezing but not bending lifting. She can have urge incontinence. She wears 1 depends a day. No bedwetting. Voids twice a night and every 2-3 hours during the day  It appears that she has had hospitalization with sepsis 3 times in the last year with very little symptoms preceding this.  Patient has prolapse symptoms. She has had 3 bladder infections with admission to the hospital.  Patient was drawn. If the patient decides to proceed with surgery I recommend that the vaginectomy at [facility] was actually a good choice to shorten the time of her surgery with her medical comorbidities.  She understands prolapse is not related to her urinary tract infections. Role of prophylaxis discussed. Role of estrogen cream and probiotics discussed  Nausea with Macrodantin I believe has a sulfa allergy. Reassess in 8 weeks on daily Keflex. She has been having more trouble with atrial fibrillation and I think we all agree watchful waiting for prolapse is her final decision  On August 27 patient saw a nurse practitioner with 1 week history of  occasional dysuria and frequency and was not using estrogen cream.  CT scan Mar 05, 2020 normal when she was in the hospital last time.  Residual 110 mL.  Poor historian.  Was given fosfomycin and encouraged to use estrogen.  Culture was positive on that day  Clinically doing well.  Minimal incontinence.  Clinically infection free.  Using estrogen on daily Keflex  Today Patient last seen July 13, 2020 on daily Keflex.  Called in today wishing to be seen The last 2 cultures with Pseudomonas have been resistant to antibiotics but sensitive to ciprofloxacin.  She is allergic to ciprofloxacin and gets a rash.  Still uses probiotics.  Her main symptom was increased frequency.  She also would have urgency then cannot urinate that well.  She said it comes and goes and is a bit worse than usual.  No foul-smelling urine or cloudiness.  No fever  Had breast surgery 1 week ago.  Urine reviewed.  Urine sent for culture.  Chart reviewed.  Cystoscopy orders   PMH: Past Medical History:  Diagnosis Date  . Arthritis   . Atrial fibrillation ([place])   . Breast cancer ([place]) 2006   right breast ca with lumpectomy and rad tx  . Colon polyp   . Complication of anesthesia    questions about husband who passed in 2012   . Cystocele   . Depression   . Dysrhythmia   . Female bladder prolapse   . GERD (gastroesophageal reflux disease)   . Goiter   . Hyperlipemia   . Hypertension   . Hypothyroidism   . Osteoporosis   . Personal history of radiation therapy 2006  right breast ca  . Pneumonia   . Procidentia of uterus   . Recurrent UTI   . Reflux   . TIA (transient ischemic attack)   . Vaginal atrophy     Surgical History: Past Surgical History:  Procedure Laterality Date  . APPENDECTOMY    . BREAST BIOPSY Right 2006   breast cancer  . BREAST LUMPECTOMY Right 2006   positive  . BREAST SURGERY Right    lumpectomy  . CATARACT EXTRACTION W/ INTRAOCULAR LENS  IMPLANT, BILATERAL    .  COLONOSCOPY    . CYSTOCELE REPAIR N/A 12/21/2015   Procedure: ANTERIOR REPAIR (CYSTOCELE);  Surgeon: Brayton Mars, MD;  Location: [facility] ORS;  Service: Gynecology;  Laterality: N/A;  . DILATION AND CURETTAGE OF UTERUS    . ELECTROMAGNETIC NAVIGATION BROCHOSCOPY Right 12/13/2018   Procedure: ELECTROMAGNETIC NAVIGATION BRONCHOSCOPY;  Surgeon: Flora Lipps, MD;  Location: [facility] ORS;  Service: Cardiopulmonary;  Laterality: Right;  . EYE SURGERY    . PARTIAL MASTECTOMY WITH NEEDLE LOCALIZATION AND AXILLARY SENTINEL LYMPH NODE BX Left 08/03/2020   Procedure: PARTIAL MASTECTOMY WITH Radio Frequency tag AND AXILLARY SENTINEL LYMPH NODE BX;  Surgeon: Herbert Pun, MD;  Location: [facility] ORS;  Service: General;  Laterality: Left;  Marland Kitchen VAGINAL HYSTERECTOMY Bilateral 12/21/2015   Procedure: TVH BSO;  Surgeon: Brayton Mars, MD;  Location: [facility] ORS;  Service: Gynecology;  Laterality: Bilateral;    Home Medications:  Allergies as of 08/10/2020      Reactions   Bactrim [sulfamethoxazole-trimethoprim] Hives   Iodinated Diagnostic Agents Anaphylaxis   Codeine Nausea And Vomiting   Erythromycin Ethylsuccinate Other (See Comments)   Unknown   Phenobarbital Other (See Comments)   Feeling funny, nervous   Ciprofloxacin Rash   Penicillins Rash      Medication List       Accurate as of August 10, 2020  2:49 PM. If you have any questions, ask your nurse or doctor.        acetaminophen 325 MG tablet Commonly known as: TYLENOL Take 650 mg by mouth every 6 (six) hours as needed (pain.).   apixaban 2.5 MG Tabs tablet Commonly known as: ELIQUIS Take 1 tablet (2.5 mg total) by mouth 2 (two) times daily.   atorvastatin 10 MG tablet Commonly known as: LIPITOR Take 1 tablet by mouth at bedtime.   Calcium Carbonate-Vitamin D 600-200 MG-UNIT Tabs Take 1 tablet by mouth daily.   cephALEXin 250 MG capsule Commonly known as: KEFLEX Take 1 capsule (250 mg total) by mouth daily.     cyanocobalamin 1000 MCG/ML injection Commonly known as: (VITAMIN B-12) Inject 1,000 mcg into the muscle every 28 (twenty-eight) days.   diltiazem 120 MG 24 hr capsule Commonly known as: CARDIZEM CD Take 1 capsule (120 mg total) by mouth daily.   escitalopram 10 MG tablet Commonly known as: LEXAPRO Take 10 mg by mouth daily.   feeding supplement Liqd Take 237 mLs by mouth 2 (two) times daily between meals.   levothyroxine 50 MCG tablet Commonly known as: SYNTHROID Take 50 mcg by mouth daily before breakfast.   omeprazole 20 MG capsule Commonly known as: PRILOSEC Take 20 mg by mouth daily.   ondansetron 4 MG tablet Commonly known as: Zofran Take 1 tablet (4 mg total) by mouth daily as needed for nausea or vomiting.   pantoprazole 40 MG tablet Commonly known as: PROTONIX Take 1 tablet (40 mg total) by mouth daily.   Premarin vaginal cream Generic drug: conjugated estrogens Dispose of  applicator. Apply pea size amount to vaginal area Monday, Wednesday and Friday night. What changed:   how much to take  how to take this  when to take this   Probiotic Acidophilus Caps Take 1 capsule by mouth in the morning and at bedtime.   traMADol 50 MG tablet Commonly known as: Ultram Take 1 tablet (50 mg total) by mouth every 6 (six) hours as needed.       Allergies:  Allergies  Allergen Reactions  . Bactrim [Sulfamethoxazole-Trimethoprim] Hives  . Iodinated Diagnostic Agents Anaphylaxis  . Codeine Nausea And Vomiting  . Erythromycin Ethylsuccinate Other (See Comments)    Unknown   . Phenobarbital Other (See Comments)    Feeling funny, nervous  . Ciprofloxacin Rash  . Penicillins Rash    Family History: Family History  Problem Relation Age of Onset  . Diabetes Sister   . Breast cancer Sister        late 79's  . Diabetes Brother   . Diabetes Sister     Social History:  reports that she has never smoked. She has never used smokeless tobacco. She reports that  she does not drink alcohol and does not use drugs.  ROS:                                        Physical Exam: There were no vitals taken for this visit.    Laboratory Data: Lab Results  Component Value Date   WBC 7.3 05/07/2020   HGB 11.1 (L) 05/07/2020   HCT 33.7 (L) 05/07/2020   MCV 92.8 05/07/2020   PLT 244 05/07/2020    Lab Results  Component Value Date   CREATININE 0.95 05/07/2020    No results found for: PSA  No results found for: TESTOSTERONE  Lab Results  Component Value Date   HGBA1C 5.5 04/15/2013    Urinalysis    Component Value Date/Time   COLORURINE YELLOW (A) 05/04/2020 2154   APPEARANCEUR Cloudy (A) 07/13/2020 0942   LABSPEC 1.012 05/04/2020 2154   PHURINE 5.0 05/04/2020 2154   GLUCOSEU Negative 07/13/2020 0942   HGBUR NEGATIVE 05/04/2020 2154   BILIRUBINUR Negative 07/13/2020 [place] 05/04/2020 2154   PROTEINUR Negative 07/13/2020 [place] NEGATIVE 05/04/2020 2154   NITRITE Negative 07/13/2020 0942   NITRITE NEGATIVE 05/04/2020 2154   LEUKOCYTESUR Negative 07/13/2020 0942   LEUKOCYTESUR SMALL (A) 05/04/2020 2154    Pertinent Imaging:   Assessment & Plan: Check through her medical records and because she is becoming resistant and having breakthrough infections perhaps for completion I will perform cystoscopy in 6 weeks.  I could not find out for sure if she had this at [facility].  She could not recall for certain.  It probably will not change her management.  Call with culture results and stay on Keflex.  1. Recurrent UTI  - Urinalysis, Complete - Bladder Scan (Post Void Residual) in office   No follow-ups on file.  Reece Packer, MD  [place] [address] [place], [place] [address] [phone]

## 2020-08-10 NOTE — Telephone Encounter (Signed)
Important to note that patient had breast surgery on 08/03/20.

## 2020-08-10 NOTE — Telephone Encounter (Signed)
Patient called the office this morning.  She was seen on 9/27 by Dr. Matilde Sprang.  She is taking 250mg  of Keflex everyday.  She calls today with complaint of urinary frequency, straining to urinate.  She fears that she may have a UTI.    Please call patient to assess if she needs an appointment.

## 2020-08-11 LAB — URINALYSIS, COMPLETE
Bilirubin, UA: NEGATIVE
Glucose, UA: NEGATIVE
Ketones, UA: NEGATIVE
Nitrite, UA: POSITIVE — AB
Specific Gravity, UA: 1.025 (ref 1.005–1.030)
Urobilinogen, Ur: 0.2 mg/dL (ref 0.2–1.0)
pH, UA: 5.5 (ref 5.0–7.5)

## 2020-08-11 LAB — MICROSCOPIC EXAMINATION: WBC, UA: >30 /hpf — AB (ref 0–5)

## 2020-08-13 LAB — CULTURE, URINE COMPREHENSIVE

## 2020-08-14 LAB — SURGICAL PATHOLOGY

## 2020-08-17 ENCOUNTER — Other Ambulatory Visit: Payer: Self-pay | Admitting: *Deleted

## 2020-08-17 ENCOUNTER — Telehealth: Payer: Self-pay

## 2020-08-17 ENCOUNTER — Inpatient Hospital Stay: Payer: Medicare Other | Attending: Internal Medicine | Admitting: Internal Medicine

## 2020-08-17 ENCOUNTER — Other Ambulatory Visit: Payer: Self-pay

## 2020-08-17 DIAGNOSIS — C50512 Malignant neoplasm of lower-outer quadrant of left female breast: Secondary | ICD-10-CM | POA: Insufficient documentation

## 2020-08-17 DIAGNOSIS — Z87440 Personal history of urinary (tract) infections: Secondary | ICD-10-CM | POA: Insufficient documentation

## 2020-08-17 DIAGNOSIS — Z7901 Long term (current) use of anticoagulants: Secondary | ICD-10-CM | POA: Diagnosis not present

## 2020-08-17 DIAGNOSIS — Z170 Estrogen receptor positive status [ER+]: Secondary | ICD-10-CM | POA: Diagnosis not present

## 2020-08-17 DIAGNOSIS — I4891 Unspecified atrial fibrillation: Secondary | ICD-10-CM | POA: Insufficient documentation

## 2020-08-17 DIAGNOSIS — N390 Urinary tract infection, site not specified: Secondary | ICD-10-CM

## 2020-08-17 MED ORDER — FOSFOMYCIN TROMETHAMINE 3 G PO PACK: 3.0000 g | PACK | Freq: Once | ORAL | Status: DC

## 2020-08-17 NOTE — Telephone Encounter (Signed)
Pt aware. Verbalized understanding.  

## 2020-08-17 NOTE — Assessment & Plan Note (Addendum)
#  Clinical stage I- LEFT BREAST INVASIVE MAMMARY CA with  LOBULAR CANCER-  pT1a [30mm;pN0 ];  ER-POSITIVE; PR- NEG; Her-2- POSITIVE.   #Reviewed the pathology and stage of cancer with the patient and daughter in detail.  I would recommend adjuvant radiation given the lumpectomy/HER-2/neu positive disease.  #Discussed the mechanism of action of aromatase inhibitors-with blocking of estrogen to prevent breast cancer.  This would be recommended after finishing radiation.  #I discussed the role of adjuvant chemotherapy with Taxol and Herceptin especially in tumors greater than 5 mm; also the patient was much younger.  I worry about side effects of chemotherapy including but not limited to neuropathy/low blood counts risk of infection fatigue.  Also risk of cardiotoxicity from Herceptin.  #A. Fib-on Eliquis/amiodarone-stable  #History of UTIs- on prophylaxis Kelfex; awaiting evaluation at [facility].  Stable  #Genetic counseling: Given 2 separate primaries/metachronous breast cancer I would recommend evaluation with genetic counseling.  Patient has 2 daughters.  I spoke at length with the patient's daughter- regarding the patient's clinical status/plan of care.  Family agreement.   # DISPOSITION: # Referral to Dr.Crystal re: breast cancer # follow up in mid-dec 2021- Dr.B  # 40 minutes face-to-face with the patient discussing the above plan of care; more than 50% of time spent on prognosis/ natural history; counseling and coordination.

## 2020-08-17 NOTE — Telephone Encounter (Signed)
My ERROR Should say Monurol 3 gram sachet x 1 Thanks for the heads up

## 2020-08-17 NOTE — Telephone Encounter (Signed)
Spoke with Patient and her daughter .  Patient had a uti on 07/13/2020 was given the monurol medication then as well.. Her daughter is expressing concern if this medication is working or not.  She was wondering if patient is able to drop off a repeat urine after this dose to see if the medication is getting rid of the infection

## 2020-08-17 NOTE — Addendum Note (Signed)
Addended by: Alvera Novel on: 08/17/2020 04:12 PM   Modules accepted: Orders

## 2020-08-17 NOTE — Telephone Encounter (Signed)
Can you please clarify if this is the medication. Omnicef one 3 gram sachet x 1. I am unable to find packet for this medication just capsules

## 2020-08-17 NOTE — Telephone Encounter (Signed)
-----   Message from Chrystie Nose, [place] sent at 08/17/2020  7:41 AM EDT -----  ----- Message ----- From: Bjorn Loser, MD Sent: 08/14/2020   4:21 PM EDT To: Chrystie Nose, CMA  Omnicef one 3 gram sachet x 1 Then go back on prophylaxis    ----- Message ----- From: Chrystie Nose, CMA Sent: 08/13/2020   1:44 PM EDT To: Bjorn Loser, MD   ----- Message ----- From: Interface, [facility] Lab Results In Sent: 08/11/2020   1:37 PM EDT To: Rowe Robert Clinical

## 2020-08-17 NOTE — Progress Notes (Signed)
[facility] [place] NOTE  Patient Care Team: Idelle Crouch, MD as PCP - General (Internal Medicine) Noreene Filbert, MD as Radiation Oncologist (Radiation Oncology)  CHIEF COMPLAINTS/PURPOSE OF CONSULTATION: Breast cancer  #  Oncology History Overview Note  #Right breast cancer [2006; Dr.Choksi]-s/p lumpectomy followed by radiation; ? Endocrine therapy.   # AUG 2021- LEFT BREAST INVASIVE LOBULAR CA wREPEAT ER-POSITIVE; PR-NEGATIVE' /Her-2 NEU- POSITIVE s/p LUMPECTOMY; pT1a;pN0 [ stage I]  # A.fib- on eliuiqs [Dr.callwood]; frequent UTIs [[facility] ]; COVID in dec 2021.   # SURVIVORSHIP:   # GENETICS:   DIAGNOSIS: left breast cancer  STAGE:    I     ;  GOALS: cure  CURRENT/MOST RECENT THERAPY : RT    Carcinoma of lower-outer quadrant of left breast in female, estrogen receptor positive ([place])  07/23/2020 Initial Diagnosis   Carcinoma of lower-outer quadrant of left breast in female, estrogen receptor negative ([place])      HISTORY OF PRESENTING ILLNESS:  Sheryl Michael 86 y.o.  female patient with left-sided breast cancer status post surgery is here for follow-up.  Patient is recuperating fairly well from surgery. Denies any significant pain. However admits to bruising from the surgery.  Otherwise no nausea no vomiting. No fevers or chills.  Review of Systems  Constitutional: Negative for chills, diaphoresis, fever, malaise/fatigue and weight loss.  HENT: Negative for nosebleeds and sore throat.   Eyes: Negative for double vision.  Respiratory: Negative for cough, hemoptysis, sputum production, shortness of breath and wheezing.   Cardiovascular: Negative for chest pain, palpitations, orthopnea and leg swelling.  Gastrointestinal: Negative for abdominal pain, blood in stool, constipation, diarrhea, heartburn, melena, nausea and vomiting.  Genitourinary: Negative for dysuria, frequency and urgency.  Musculoskeletal: Positive for back pain and joint  pain.  Skin: Negative.  Negative for itching and rash.  Neurological: Negative for dizziness, tingling, focal weakness, weakness and headaches.  Endo/Heme/Allergies: Does not bruise/bleed easily.  Psychiatric/Behavioral: Negative for depression. The patient is not nervous/anxious and does not have insomnia.      MEDICAL HISTORY:  Past Medical History:  Diagnosis Date  . Arthritis   . Atrial fibrillation ([place])   . Breast cancer ([place]) 2006   right breast ca with lumpectomy and rad tx  . Colon polyp   . Complication of anesthesia    questions about husband who passed in 2012   . Cystocele   . Depression   . Dysrhythmia   . Female bladder prolapse   . GERD (gastroesophageal reflux disease)   . Goiter   . Hyperlipemia   . Hypertension   . Hypothyroidism   . Osteoporosis   . Personal history of radiation therapy 2006   right breast ca  . Pneumonia   . Procidentia of uterus   . Recurrent UTI   . Reflux   . TIA (transient ischemic attack)   . Vaginal atrophy     SURGICAL HISTORY: Past Surgical History:  Procedure Laterality Date  . APPENDECTOMY    . BREAST BIOPSY Right 2006   breast cancer  . BREAST LUMPECTOMY Right 2006   positive  . BREAST SURGERY Right    lumpectomy  . CATARACT EXTRACTION W/ INTRAOCULAR LENS  IMPLANT, BILATERAL    . COLONOSCOPY    . CYSTOCELE REPAIR N/A 12/21/2015   Procedure: ANTERIOR REPAIR (CYSTOCELE);  Surgeon: Brayton Mars, MD;  Location: [facility] ORS;  Service: Gynecology;  Laterality: N/A;  . DILATION AND CURETTAGE OF UTERUS    . ELECTROMAGNETIC  NAVIGATION BROCHOSCOPY Right 12/13/2018   Procedure: ELECTROMAGNETIC NAVIGATION BRONCHOSCOPY;  Surgeon: Flora Lipps, MD;  Location: [facility] OR[facility];  Service: Cardiopulmonary;  Laterality: Right;  . EYE SURGERY    . PARTIAL MASTECTOMY WITH NEEDLE LOCALIZATION AND AXILLARY SENTINEL LYMPH NODE BX Left 08/03/2020   Procedure: PARTIAL MASTECTOMY WITH Radio Frequency tag AND AXILLARY SENTINEL LYMPH NODE BX;   Surgeon: Herbert Pun, MD;  Location: [facility] OR[facility];  Service: General;  Laterality: Left;  Marland Kitchen VAGINAL HYSTERECTOMY Bilateral 12/21/2015   Procedure: TVH BSO;  Surgeon: Brayton Mars, MD;  Location: [facility] OR[facility];  Service: Gynecology;  Laterality: Bilateral;    SOCIAL HISTORY: Social History   Socioeconomic History  . Marital status: Widowed    Spouse name: Not on file  . Number of children: Not on file  . Years of education: Not on file  . Highest education level: Not on file  Occupational History  . Not on file  Tobacco Use  . Smoking status: Never Smoker  . Smokeless tobacco: Never Used  Vaping Use  . Vaping Use: Never used  Substance and Sexual Activity  . Alcohol use: No  . Drug use: No  . Sexual activity: Never    Birth control/protection: Post-menopausal  Other Topics Concern  . Not on file  Social History Narrative  . Not on file   Social Determinants of Health   Financial Resource Strain:   . Difficulty of Paying Living Expenses: Not on file  Food Insecurity:   . Worried About Charity fundraiser in the Last Year: Not on file  . Ran Out of Food in the Last Year: Not on file  Transportation Needs:   . Lack of Transportation (Medical): Not on file  . Lack of Transportation (Non-Medical): Not on file  Physical Activity:   . Days of Exercise per Week: Not on file  . Minutes of Exercise per Session: Not on file  Stress:   . Feeling of Stress : Not on file  Social Connections:   . Frequency of Communication with Friends and Family: Not on file  . Frequency of Social Gatherings with Friends and Family: Not on file  . Attends Religious Services: Not on file  . Active Member of Clubs or Organizations: Not on file  . Attends Archivist Meetings: Not on file  . Marital Status: Not on file  Intimate Partner Violence:   . Fear of Current or Ex-Partner: Not on file  . Emotionally Abused: Not on file  . Physically Abused: Not on file  . Sexually  Abused: Not on file    FAMILY HISTORY: Family History  Problem Relation Age of Onset  . Diabetes Sister   . Breast cancer Sister        late 63's  . Diabetes Brother   . Diabetes Sister     ALLERGIES:  is allergic to bactrim [sulfamethoxazole-trimethoprim], iodinated diagnostic agents, codeine, erythromycin ethylsuccinate, phenobarbital, ciprofloxacin, and penicillins.  MEDICATIONS:  Current Outpatient Medications  Medication Sig Dispense Refill  . apixaban (ELIQUIS) 2.5 MG TABS tablet Take 1 tablet (2.5 mg total) by mouth 2 (two) times daily. 60 tablet 0  . Calcium Carbonate-Vitamin D 600-200 MG-UNIT TABS Take 1 tablet by mouth daily.     . cephALEXin (KEFLEX) 250 MG capsule Take 1 capsule (250 mg total) by mouth daily. 90 capsule 3  . cyanocobalamin (,VITAMIN B-12,) 1000 MCG/ML injection Inject 1,000 mcg into the muscle every 28 (twenty-eight) days.    Marland Kitchen diltiazem (CARDIZEM CD) 120  MG 24 hr capsule Take 1 capsule (120 mg total) by mouth daily. 30 capsule 0  . escitalopram (LEXAPRO) 10 MG tablet Take 10 mg by mouth daily.     . Lactobacillus (PROBIOTIC ACIDOPHILUS) CAPS Take 1 capsule by mouth in the morning and at bedtime.    Marland Kitchen levothyroxine (SYNTHROID, LEVOTHROID) 50 MCG tablet Take 50 mcg by mouth daily before breakfast.    . omeprazole (PRILOSEC) 20 MG capsule Take 20 mg by mouth daily.    Marland Kitchen acetaminophen (TYLENOL) 325 MG tablet Take 650 mg by mouth every 6 (six) hours as needed (pain.).  (Patient not taking: Reported on 08/17/2020)    . feeding supplement, ENSURE ENLIVE, (ENSURE ENLIVE) LIQD Take 237 mLs by mouth 2 (two) times daily between meals. (Patient not taking: Reported on 07/24/2020) 237 mL 12   No current facility-administered medications for this visit.      Marland Kitchen  PHYSICAL EXAMINATION: ECOG PERFORMANCE STATUS: 0 - Asymptomatic  Vitals:   08/17/20 1122  BP: (!) 140/49  Pulse: 60  Resp: 18  Temp: (!) 97.5 F (36.4 C)   Filed Weights   08/17/20 1122  Weight:  127 lb (57.6 kg)    Physical Exam Constitutional:      Comments: Patient is ambulating independently. Accompanied by daughter.  HENT:     Head: Normocephalic and atraumatic.     Mouth/Throat:     Pharynx: No oropharyngeal exudate.  Eyes:     Pupils: Pupils are equal, round, and reactive to light.  Cardiovascular:     Rate and Rhythm: Normal rate and regular rhythm.  Pulmonary:     Effort: Pulmonary effort is normal. No respiratory distress.     Breath sounds: No wheezing.  Abdominal:     General: Bowel sounds are normal. There is no distension.     Palpations: Abdomen is soft. There is no mass.     Tenderness: There is no abdominal tenderness. There is no guarding or rebound.  Musculoskeletal:        General: No tenderness. Normal range of motion.     Cervical back: Normal range of motion and neck supple.  Skin:    General: Skin is warm.  Neurological:     Mental Status: She is alert and oriented to person, place, and time.  Psychiatric:        Mood and Affect: Affect normal.      LABORATORY DATA:  I have reviewed the data as listed Lab Results  Component Value Date   WBC 7.3 05/07/2020   HGB 11.1 (L) 05/07/2020   HCT 33.7 (L) 05/07/2020   MCV 92.8 05/07/2020   PLT 244 05/07/2020   Recent Labs    03/04/20 2253 03/05/20 0418 05/04/20 1823 05/04/20 1823 05/05/20 0434 05/06/20 0458 05/07/20 0503  NA 135   < > 139  --  140 139  --   K 4.1   < > 3.7  --  4.0 4.2  --   CL 103   < > 103  --  108 109  --   CO2 23   < > 25  --  25 23  --   GLUCOSE 120*   < > 113*  --  133* 82  --   BUN 16   < > 16  --  18 16  --   CREATININE 1.09*   < > 1.23*   < > 1.06* 0.91 0.95  CALCIUM 8.9   < > 8.9  --  8.4* 8.4*  --   GFRNONAA 46*   < > 39*   < > 47* 57* 54*  GFRAA 53*   < > 46*   < > 55* >60 >60  PROT 7.1  --   --   --   --   --   --   ALBUMIN 3.5  --   --   --   --   --   --   AST 17  --   --   --   --   --   --   ALT 12  --   --   --   --   --   --   ALKPHOS 68  --    --   --   --   --   --   BILITOT 0.8  --   --   --   --   --   --    < > = values in this interval not displayed.    RADIOGRAPHIC STUDIES: I have personally reviewed the radiological images as listed and agreed with the findings in the report. MR BREAST BILATERAL W WO CONTRAST INC CAD  Result Date: 07/22/2020 CLINICAL DATA:  86 year old female with newly diagnosed 0.3 cm LOWER OUTER LEFT breast malignancy. History of RIGHT breast cancer and lumpectomy in 2007. LABS:  None performed today EXAM: BILATERAL BREAST MRI WITH AND WITHOUT CONTRAST TECHNIQUE: Multiplanar, multisequence MR images of both breasts were obtained prior to and following the intravenous administration of 5 ml of Gadavist Three-dimensional MR images were rendered by post-processing of the original MR data on an independent workstation. The three-dimensional MR images were interpreted, and findings are reported in the following complete MRI report for this study. Three dimensional images were evaluated at the independent interpreting workstation using the DynaCAD thin client. COMPARISON:  Prior mammograms FINDINGS: Breast composition: a. Almost entirely fat. Background parenchymal enhancement: Minimal Right breast: No suspicious mass or abnormal enhancement. Lumpectomy changes are present. Left breast: Biopsy clip artifact and post biopsy changes within the LOWER OUTER LEFT breast identified, middle depth. No suspicious enhancement is identified. The biopsied malignancy is not visualized as a discrete lesion on this exam. Lymph nodes: No abnormal appearing lymph nodes. Ancillary findings:  None. IMPRESSION: 1. Biopsy clip artifact and post biopsy changes within the LOWER OUTER LEFT breast at the site of biopsy-proven malignancy. 2. No evidence of multifocal, multicentric or contralateral malignancy. No abnormal appearing lymph nodes. RECOMMENDATION: Treatment plan BI-RADS CATEGORY  6: Known biopsy-proven malignancy. Electronically Signed    By: Margarette Canada M.D.   On: 07/22/2020 15:46   NM SENTINEL NODE INJECTION  Result Date: 08/03/2020 CLINICAL DATA:  Left breast cancer. EXAM: NUCLEAR MEDICINE BREAST LYMPHOSCINTIGRAPHY LEFT TECHNIQUE: Intradermal injection of radiopharmaceutical was performed at the 12 o'clock, 3 o'clock, 6 o'clock, and 9 o'clock positions around the LEFT nipple. The patient was then sent to the operating room where the sentinel node(s) were identified and removed by the surgeon. RADIOPHARMACEUTICALS:  Total of 1 mCi Millipore-filtered Technetium-12msulfur colloid, injected in four aliquots of 0.25 mCi each. IMPRESSION: Uncomplicated intradermal injection of a total of 1 mCi Technetium-930mulfur colloid for purposes of sentinel node identification. Electronically Signed   By: M.Jerilynn Mages Shick M.D.   On: 08/03/2020 09:44   MM Breast Surgical Specimen  Result Date: 08/03/2020 CLINICAL DATA:  Status post radiofrequency tag localized left breast lumpectomy. EXAM: SPECIMEN RADIOGRAPH OF THE LEFT BREAST COMPARISON:  Previous exam(s). FINDINGS: Status  post excision of the left breast. The radiofrequency tag and vision shaped clip are present within the specimen. IMPRESSION: Specimen radiograph of the left breast. Electronically Signed   By: Kristopher Oppenheim M.D.   On: 08/03/2020 15:03   MM LT RADIO FREQUENCY TAG LOC MAMMO GUIDE  Result Date: 07/30/2020 CLINICAL DATA:  Localization of a left breast cancer EXAM: MAMMOGRAPHIC GUIDED RADIOFREQUENCY DEVICE LOCALIZATION OF THE LEFT BREAST COMPARISON:  Previous exam(s) FINDINGS: Patient presents for radiofrequency device localization prior to surgery. I met with the patient and we discussed the procedure of radiofrequency device localization including benefits and alternatives. We discussed the high likelihood of a successful procedure. We discussed the risks of the procedure including infection, bleeding, tissue injury and further surgery. Informed, written consent was given. The usual  time-out protocol was performed immediately prior to the procedure. Using mammographic guidance, sterile technique, 1% lidocaine as local anesthesia, a radiofrequency tag was used to localize the patient has known left breast cancer using a medial approach. The follow-up mammogram images confirm that the RF device is in the expected location and are marked for the surgeon. Follow-up survey of the patient confirms the presence of the RF device. The patient tolerated the procedure well and was [facility] from the [facility]. IMPRESSION: Radiofrequency device localization of the known cancer in the left breast. No apparent complications. Electronically Signed   By: Dorise Bullion III M.D   On: 07/30/2020 16:36    ASSESSMENT & PLAN:   Carcinoma of lower-outer quadrant of left breast in female, estrogen receptor positive ([place]) #Clinical stage I- LEFT BREAST INVASIVE MAMMARY CA with  LOBULAR CANCER-  pT1a [62m;pN0 ];  ER-POSITIVE; PR- NEG; Her-2- POSITIVE.   #Reviewed the pathology and stage of cancer with the patient and daughter in detail.  I would recommend adjuvant radiation given the lumpectomy/HER-2/neu positive disease.  #Discussed the mechanism of action of aromatase inhibitors-with blocking of estrogen to prevent breast cancer.  This would be recommended after finishing radiation.  #I discussed the role of adjuvant chemotherapy with Taxol and Herceptin especially in tumors greater than 5 mm; also the patient was much younger.  I worry about side effects of chemotherapy including but not limited to neuropathy/low blood counts risk of infection fatigue.  Also risk of cardiotoxicity from Herceptin.  #A. Fib-on Eliquis/amiodarone-stable  #History of UTIs- on prophylaxis Kelfex; awaiting [facility]  Stable  #Genetic counseling: Given 2 separate primaries/metachronous breast cancer I would recommend evaluation with genetic counseling.  Patient has 2 daughters.  I spoke at length with  the patient's daughter- regarding the patient's clinical status/plan of care.  Family agreement.   # DISPOSITION: # Referral to Dr.Crystal re: breast cancer # follow up in mid-dec 2021- Dr.B  # 40 minutes face-to-face with the patient discussing the above plan of care; more than 50% of time spent on prognosis/ natural history; counseling and coordination.   All questions were answered. The patient/family knows to call the clinic with any problems, questions or concerns.    GCammie Sickle MD 08/17/2020 3:52 PM

## 2020-08-18 MED ORDER — FOSFOMYCIN TROMETHAMINE 3 G PO PACK: 3.0000 g | PACK | Freq: Once | ORAL | 0 refills | Status: AC

## 2020-08-18 NOTE — Telephone Encounter (Signed)
sw daughter, will call back to schedule

## 2020-08-18 NOTE — Telephone Encounter (Signed)
Good idea to repeat the culture - she has resistance and allergies issues

## 2020-08-24 ENCOUNTER — Ambulatory Visit
Admission: RE | Admit: 2020-08-24 | Discharge: 2020-08-24 | Disposition: A | Discharge status: Home / Self Care | Payer: Medicare Other | Source: Ambulatory Visit | Attending: Radiation Oncology | Admitting: Radiation Oncology

## 2020-08-24 ENCOUNTER — Encounter: Payer: Self-pay | Admitting: Radiation Oncology

## 2020-08-24 ENCOUNTER — Other Ambulatory Visit: Payer: Self-pay

## 2020-08-24 VITALS — Temp 96.5°F | Wt 127.9 lb

## 2020-08-24 DIAGNOSIS — K219 Gastro-esophageal reflux disease without esophagitis: Secondary | ICD-10-CM | POA: Insufficient documentation

## 2020-08-24 DIAGNOSIS — I4891 Unspecified atrial fibrillation: Secondary | ICD-10-CM | POA: Diagnosis not present

## 2020-08-24 DIAGNOSIS — Z923 Personal history of irradiation: Secondary | ICD-10-CM | POA: Diagnosis not present

## 2020-08-24 DIAGNOSIS — Z8673 Personal history of transient ischemic attack (TIA), and cerebral infarction without residual deficits: Secondary | ICD-10-CM | POA: Diagnosis not present

## 2020-08-24 DIAGNOSIS — I10 Essential (primary) hypertension: Secondary | ICD-10-CM | POA: Insufficient documentation

## 2020-08-24 DIAGNOSIS — F329 Major depressive disorder, single episode, unspecified: Secondary | ICD-10-CM | POA: Diagnosis not present

## 2020-08-24 DIAGNOSIS — E785 Hyperlipidemia, unspecified: Secondary | ICD-10-CM | POA: Insufficient documentation

## 2020-08-24 DIAGNOSIS — M810 Age-related osteoporosis without current pathological fracture: Secondary | ICD-10-CM | POA: Diagnosis not present

## 2020-08-24 DIAGNOSIS — Z170 Estrogen receptor positive status [ER+]: Secondary | ICD-10-CM | POA: Insufficient documentation

## 2020-08-24 DIAGNOSIS — Z79899 Other long term (current) drug therapy: Secondary | ICD-10-CM | POA: Diagnosis not present

## 2020-08-24 DIAGNOSIS — Z7901 Long term (current) use of anticoagulants: Secondary | ICD-10-CM | POA: Insufficient documentation

## 2020-08-24 DIAGNOSIS — Z79811 Long term (current) use of aromatase inhibitors: Secondary | ICD-10-CM | POA: Diagnosis not present

## 2020-08-24 DIAGNOSIS — C50512 Malignant neoplasm of lower-outer quadrant of left female breast: Secondary | ICD-10-CM | POA: Diagnosis present

## 2020-08-24 DIAGNOSIS — E039 Hypothyroidism, unspecified: Secondary | ICD-10-CM | POA: Insufficient documentation

## 2020-08-24 DIAGNOSIS — Z86010 Personal history of colonic polyps: Secondary | ICD-10-CM | POA: Diagnosis not present

## 2020-08-24 NOTE — Consult Note (Signed)
NEW PATIENT EVALUATION  Name: Sheryl Michael  MRN: [id number]  Date:   08/24/2020     DOB: 20-Jul-1931   This 86 y.o. female patient presents to the clinic for initial evaluation of stage I (T1a ER positive PR negative HER-2/neu overexpressed status post wide local excision and patient previously treated back in 2006 2 her right breast for invasive mammary carcinoma.  REFERRING PHYSICIAN: Idelle Crouch, MD  CHIEF COMPLAINT:  Chief Complaint  Patient presents with  . Breast Cancer    Initial consultation    DIAGNOSIS: The encounter diagnosis was Carcinoma of lower-outer quadrant of left breast in female, estrogen receptor positive ([place]).   PREVIOUS INVESTIGATIONS:  Mammogram ultrasound reviewed Pathology report reviewed Clinical notes reviewed  HPI: Patient is a 86 year old female well-known to department having completed radiation therapy to her right breast back in 2006 for invasive mammary carcinoma.  She recently presented with fibroglandular density in the left breast warranting further investigation.  Ultrasound confirmed a 3 mm suspicious mass in the left breast at the 5 o'clock position.  Axilla was normal.  She underwent ultrasound-guided biopsy which was positive for invasive mammary carcinoma.  MRI of the breast showed no other evidence of multifocal multicentric or contralateral abnormality.  She underwent a wide local excision for a 5 mm grade 2 invasive mammary carcinoma with margins clear at 6 mm.  2 sentinel lymph nodes were negative for malignancy.  She is to see medical oncology.  Tumor was ER positive PR negative and HER-2/neu overexpressed.  Based on the size of the tumor medical oncology did not recommend systemic chemotherapy.  She will be treated with aromatase inhibitor after completion of radiation.  She is seen today for evaluation is doing well.  She states her breast is healing well she specifically [place] breast tenderness cough or bone pain.  PLANNED TREATMENT  REGIMEN: Hypofractionated whole breast radiation to her left breast  PAST MEDICAL HISTORY:  has a past medical history of Arthritis, Atrial fibrillation ([place]), Breast cancer ([place]) (2006), Colon polyp, Complication of anesthesia, Cystocele, Depression, Dysrhythmia, Female bladder prolapse, GERD (gastroesophageal reflux disease), Goiter, Hyperlipemia, Hypertension, Hypothyroidism, Osteoporosis, Personal history of radiation therapy (2006), Pneumonia, Procidentia of uterus, Recurrent UTI, Reflux, TIA (transient ischemic attack), and Vaginal atrophy.    PAST SURGICAL HISTORY:  Past Surgical History:  Procedure Laterality Date  . APPENDECTOMY    . BREAST BIOPSY Right 2006   breast cancer  . BREAST LUMPECTOMY Right 2006   positive  . BREAST SURGERY Right    lumpectomy  . CATARACT EXTRACTION W/ INTRAOCULAR LENS  IMPLANT, BILATERAL    . COLONOSCOPY    . CYSTOCELE REPAIR N/A 12/21/2015   Procedure: ANTERIOR REPAIR (CYSTOCELE);  Surgeon: Brayton Mars, MD;  Location: [facility];  Service: Gynecology;  Laterality: N/A;  . DILATION AND CURETTAGE OF UTERUS    . ELECTROMAGNETIC NAVIGATION BROCHOSCOPY Right 12/13/2018   Procedure: ELECTROMAGNETIC NAVIGATION BRONCHOSCOPY;  Surgeon: Flora Lipps, MD;  Location: [facility];  Service: Cardiopulmonary;  Laterality: Right;  . EYE SURGERY    . PARTIAL MASTECTOMY WITH NEEDLE LOCALIZATION AND AXILLARY SENTINEL LYMPH NODE BX Left 08/03/2020   Procedure: PARTIAL MASTECTOMY WITH Radio Frequency tag AND AXILLARY SENTINEL LYMPH NODE BX;  Surgeon: Herbert Pun, MD;  Location: [facility];  Service: General;  Laterality: Left;  Marland Kitchen VAGINAL HYSTERECTOMY Bilateral 12/21/2015   Procedure: TVH BSO;  Surgeon: Brayton Mars, MD;  Location: [facility];  Service: Gynecology;  Laterality: Bilateral;    FAMILY HISTORY:  family history includes Breast cancer in her sister; Diabetes in her brother, sister, and sister.  SOCIAL HISTORY:  reports that she has never smoked.  She has never used smokeless tobacco. She reports that she does not drink alcohol and does not use drugs.  ALLERGIES: Bactrim [sulfamethoxazole-trimethoprim], Iodinated diagnostic agents, Codeine, Erythromycin ethylsuccinate, Phenobarbital, Ciprofloxacin, and Penicillins  MEDICATIONS:  Current Outpatient Medications  Medication Sig Dispense Refill  . apixaban (ELIQUIS) 2.5 MG TABS tablet Take 1 tablet (2.5 mg total) by mouth 2 (two) times daily. 60 tablet 0  . Calcium Carbonate-Vitamin D 600-200 MG-UNIT TABS Take 1 tablet by mouth daily.     . cephALEXin (KEFLEX) 250 MG capsule Take 1 capsule (250 mg total) by mouth daily. 90 capsule 3  . cyanocobalamin (,VITAMIN B-12,) 1000 MCG/ML injection Inject 1,000 mcg into the muscle every 28 (twenty-eight) days.    Marland Kitchen diltiazem (CARDIZEM CD) 120 MG 24 hr capsule Take 1 capsule (120 mg total) by mouth daily. 30 capsule 0  . escitalopram (LEXAPRO) 10 MG tablet Take 10 mg by mouth daily.     . Lactobacillus (PROBIOTIC ACIDOPHILUS) CAPS Take 1 capsule by mouth in the morning and at bedtime.    Marland Kitchen levothyroxine (SYNTHROID, LEVOTHROID) 50 MCG tablet Take 50 mcg by mouth daily before breakfast.    . omeprazole (PRILOSEC) 20 MG capsule Take 20 mg by mouth daily.    Marland Kitchen acetaminophen (TYLENOL) 325 MG tablet Take 650 mg by mouth every 6 (six) hours as needed (pain.).  (Patient not taking: Reported on 08/17/2020)    . feeding supplement, ENSURE ENLIVE, (ENSURE ENLIVE) LIQD Take 237 mLs by mouth 2 (two) times daily between meals. (Patient not taking: Reported on 07/24/2020) 237 mL 12   Current Facility-Administered Medications  Medication Dose Route Frequency Provider Last Rate Last Admin  . fosfomycin (MONUROL) packet 3 g  3 g Oral Once MacDiarmid, Nicki Reaper, MD        ECOG PERFORMANCE STATUS:  0 - Asymptomatic  REVIEW OF SYSTEMS: Patient denies any weight loss, fatigue, weakness, fever, chills or night sweats. Patient denies any loss of vision, blurred vision. Patient  denies any ringing  of the ears or hearing loss. No irregular heartbeat. Patient denies heart murmur or history of fainting. Patient denies any chest pain or pain radiating to her upper extremities. Patient denies any shortness of breath, difficulty breathing at night, cough or hemoptysis. Patient denies any swelling in the lower legs. Patient denies any nausea vomiting, vomiting of blood, or coffee ground material in the vomitus. Patient denies any stomach pain. Patient states has had normal bowel movements no significant constipation or diarrhea. Patient denies any dysuria, hematuria or significant nocturia. Patient denies any problems walking, swelling in the joints or loss of balance. Patient denies any skin changes, loss of hair or loss of weight. Patient denies any excessive worrying or anxiety or significant depression. Patient denies any problems with insomnia. Patient denies excessive thirst, polyuria, polydipsia. Patient denies any swollen glands, patient denies easy bruising or easy bleeding. Patient denies any recent infections, allergies or URI. Patient "s visual fields have not changed significantly in recent time.   PHYSICAL EXAM: Temp (!) 96.5 F (35.8 C) (Tympanic)   Wt 127 lb 14.4 oz (58 kg)   BMI 24.98 kg/m  Right breast is somewhat distorted from previous radiation and surgery.  Left breast is somewhat erythematous although no warmth is noted do not think she has mastitis.  No dominant mass or nodularity is noted in  either breast in 2 positions examined.  No axillary or supraclavicular adenopathy is appreciated.  Well-developed well-nourished patient in NAD. HEENT reveals PERLA, EOMI, discs not visualized.  Oral cavity is clear. No oral mucosal lesions are identified. Neck is clear without evidence of cervical or supraclavicular adenopathy. Lungs are clear to A&P. Cardiac examination is essentially unremarkable with regular rate and rhythm without murmur rub or thrill. Abdomen is benign  with no organomegaly or masses noted. Motor sensory and DTR levels are equal and symmetric in the upper and lower extremities. Cranial nerves II through XII are grossly intact. Proprioception is intact. No peripheral adenopathy or edema is identified. No motor or sensory levels are noted. Crude visual fields are within normal range.  LABORATORY DATA: Pathology report reviewed    RADIOLOGY RESULTS: Mammogram ultrasound and MRI scans reviewed compatible with above-stated findings   IMPRESSION: Stage Ia invasive mammary carcinoma of the left breast ER positive HER-2/neu overexpressed in 86 year old female previously treated to her right breast in 2006  PLAN: This time I would recommend a hypofractionated course of treatment over 3 weeks to her left breast.  Would also boost her scar another 1000 cGy using electron beam.  Risks and benefits of treatment including skin reaction fatigue alteration of blood counts possible occlusion of superficial lung all were described in detail to the patient.  She seems to comprehend my recommendations well.  I have personally set up and ordered CT simulation for later this week.  I would like to take this opportunity to thank you for allowing me to participate in the care of your patient.Noreene Filbert, MD

## 2020-08-25 ENCOUNTER — Other Ambulatory Visit: Payer: Self-pay

## 2020-08-25 ENCOUNTER — Other Ambulatory Visit: Payer: Medicare Other

## 2020-08-25 DIAGNOSIS — N3020 Other chronic cystitis without hematuria: Secondary | ICD-10-CM

## 2020-08-25 DIAGNOSIS — N390 Urinary tract infection, site not specified: Secondary | ICD-10-CM

## 2020-08-25 LAB — MICROSCOPIC EXAMINATION: Bacteria, UA: NONE SEEN

## 2020-08-25 LAB — URINALYSIS, COMPLETE
Bilirubin, UA: NEGATIVE
Glucose, UA: NEGATIVE
Ketones, UA: NEGATIVE
Leukocytes,UA: NEGATIVE
Nitrite, UA: NEGATIVE
Protein,UA: NEGATIVE
RBC, UA: NEGATIVE
Specific Gravity, UA: 1.015 (ref 1.005–1.030)
Urobilinogen, Ur: 0.2 mg/dL (ref 0.2–1.0)
pH, UA: 6 (ref 5.0–7.5)

## 2020-08-26 ENCOUNTER — Ambulatory Visit: Payer: Medicare Other

## 2020-08-27 ENCOUNTER — Inpatient Hospital Stay: Payer: Medicare Other

## 2020-08-27 ENCOUNTER — Encounter: Payer: Self-pay | Admitting: Licensed Clinical Social Worker

## 2020-08-27 ENCOUNTER — Inpatient Hospital Stay (HOSPITAL_BASED_OUTPATIENT_CLINIC_OR_DEPARTMENT_OTHER): Payer: Medicare Other | Admitting: Licensed Clinical Social Worker

## 2020-08-27 DIAGNOSIS — C50512 Malignant neoplasm of lower-outer quadrant of left female breast: Secondary | ICD-10-CM

## 2020-08-27 DIAGNOSIS — Z803 Family history of malignant neoplasm of breast: Secondary | ICD-10-CM

## 2020-08-27 DIAGNOSIS — Z170 Estrogen receptor positive status [ER+]: Secondary | ICD-10-CM

## 2020-08-27 DIAGNOSIS — Z853 Personal history of malignant neoplasm of breast: Secondary | ICD-10-CM

## 2020-08-27 NOTE — Progress Notes (Signed)
REFERRING PROVIDER: Cammie Sickle, MD [place],  [place] [address]  PRIMARY PROVIDER:  Idelle Crouch, MD  PRIMARY REASON FOR VISIT:  1. Carcinoma of lower-outer quadrant of left breast in female, estrogen receptor positive ([place])   2. Family history of breast cancer   3. History of breast cancer      HISTORY OF PRESENT ILLNESS:   Sheryl Michael, a 86 y.o. female, was seen for a [facility] cancer genetics consultation at the request of Dr. Rogue Bussing due to a personal and family history of breast cancer.  Sheryl Michael presents to clinic today to discuss the possibility of a hereditary predisposition to cancer, genetic testing, and to further clarify her future cancer risks, as well as potential cancer risks for family members.   At the age of 47, Sheryl Michael was diagnosed with cancer of the right breast, this was treated with lumpectomy and radiation. In 2021, at the age of 52, Sheryl Michael was diagnosed with invasive lobular cancer of the left breast, ER+, PR-, Her2+. She had partial mastectomy on 08/06/20.   CANCER HISTORY:  Oncology History Overview Note  #Right breast cancer [2006; Dr.Choksi]-s/p lumpectomy followed by radiation; ? Endocrine therapy.   # AUG 2021- LEFT BREAST INVASIVE LOBULAR CA wREPEAT ER-POSITIVE; PR-NEGATIVE' /Her-2 NEU- POSITIVE s/p LUMPECTOMY; pT1a;pN0 [ stage I]  # A.fib- on eliuiqs [Dr.callwood]; frequent UTIs [[facility] ]; COVID in dec 2021.   # SURVIVORSHIP:   # GENETICS:   DIAGNOSIS: left breast cancer  STAGE:    I     ;  GOALS: cure  CURRENT/MOST RECENT THERAPY : RT    Carcinoma of lower-outer quadrant of left breast in female, estrogen receptor positive ([place])  07/23/2020 Initial Diagnosis   Carcinoma of lower-outer quadrant of left breast in female, estrogen receptor negative ([place])      RISK FACTORS:  Menarche was at age 14.  First live birth at age 60.  Ovaries intact: no.  Hysterectomy: yes.  Menopausal  status: postmenopausal.   Colonoscopy: yes; some polyps, she believes less than 10.  Past Medical History:  Diagnosis Date  . Arthritis   . Atrial fibrillation ([place])   . Breast cancer ([place]) 2006   right breast ca with lumpectomy and rad tx  . Colon polyp   . Complication of anesthesia    questions about husband who passed in 2012   . Cystocele   . Depression   . Dysrhythmia   . Family history of breast cancer   . Female bladder prolapse   . GERD (gastroesophageal reflux disease)   . Goiter   . Hyperlipemia   . Hypertension   . Hypothyroidism   . Osteoporosis   . Personal history of radiation therapy 2006   right breast ca  . Pneumonia   . Procidentia of uterus   . Recurrent UTI   . Reflux   . TIA (transient ischemic attack)   . Vaginal atrophy     Past Surgical History:  Procedure Laterality Date  . APPENDECTOMY    . BREAST BIOPSY Right 2006   breast cancer  . BREAST LUMPECTOMY Right 2006   positive  . BREAST SURGERY Right    lumpectomy  . CATARACT EXTRACTION W/ INTRAOCULAR LENS  IMPLANT, BILATERAL    . COLONOSCOPY    . CYSTOCELE REPAIR N/A 12/21/2015   Procedure: ANTERIOR REPAIR (CYSTOCELE);  Surgeon: Brayton Mars, MD;  Location: [facility];  Service: Gynecology;  Laterality: N/A;  . DILATION AND  CURETTAGE OF UTERUS    . ELECTROMAGNETIC NAVIGATION BROCHOSCOPY Right 12/13/2018   Procedure: ELECTROMAGNETIC NAVIGATION BRONCHOSCOPY;  Surgeon: Flora Lipps, MD;  Location: [facility];  Service: Cardiopulmonary;  Laterality: Right;  . EYE SURGERY    . PARTIAL MASTECTOMY WITH NEEDLE LOCALIZATION AND AXILLARY SENTINEL LYMPH NODE BX Left 08/03/2020   Procedure: PARTIAL MASTECTOMY WITH Radio Frequency tag AND AXILLARY SENTINEL LYMPH NODE BX;  Surgeon: Herbert Pun, MD;  Location: [facility];  Service: General;  Laterality: Left;  Marland Kitchen VAGINAL HYSTERECTOMY Bilateral 12/21/2015   Procedure: TVH BSO;  Surgeon: Brayton Mars, MD;  Location: [facility];  Service:  Gynecology;  Laterality: Bilateral;    Social History   Socioeconomic History  . Marital status: Widowed    Spouse name: Not on file  . Number of children: Not on file  . Years of education: Not on file  . Highest education level: Not on file  Occupational History  . Not on file  Tobacco Use  . Smoking status: Never Smoker  . Smokeless tobacco: Never Used  Vaping Use  . Vaping Use: Never used  Substance and Sexual Activity  . Alcohol use: No  . Drug use: No  . Sexual activity: Never    Birth control/protection: Post-menopausal  Other Topics Concern  . Not on file  Social History Narrative  . Not on file   Social Determinants of Health   Financial Resource Strain:   . Difficulty of Paying Living Expenses: Not on file  Food Insecurity:   . Worried About Charity fundraiser in the Last Year: Not on file  . Ran Out of Food in the Last Year: Not on file  Transportation Needs:   . Lack of Transportation (Medical): Not on file  . Lack of Transportation (Non-Medical): Not on file  Physical Activity:   . Days of Exercise per Week: Not on file  . Minutes of Exercise per Session: Not on file  Stress:   . Feeling of Stress : Not on file  Social Connections:   . Frequency of Communication with Friends and Family: Not on file  . Frequency of Social Gatherings with Friends and Family: Not on file  . Attends Religious Services: Not on file  . Active Member of Clubs or Organizations: Not on file  . Attends Archivist Meetings: Not on file  . Marital Status: Not on file     FAMILY HISTORY:  We obtained a detailed, 4-generation family history.  Significant diagnoses are listed below: Family History  Problem Relation Age of Onset  . Diabetes Sister   . Breast cancer Sister        late 32's  . Diabetes Brother   . Diabetes Sister    Sheryl Michael has 2 daughters and 1 son, no cancers. Her daughter Jackelyn Poling was present for the session today. Sheryl Michael had 6 sisters and 3  brothers. One sister had breast cancer in her 60s and is living in her late 47s. Another sister had throat cancer. No cancers in nieces/nephews.  Sheryl Michael mother died at 37. Patient had 4 maternal aunts, 1 uncle, no cancers. No known cancers in maternal cousins. No information about maternal grandparents.  Sheryl Michael father died at 65. Patient had 2 paternal aunts and 3 or 4 uncles, no known cancers. No known cancers in paternal cousins or grandparents.   Sheryl Michael is unaware of previous family history of genetic testing for hereditary cancer risks. Patient's maternal ancestors are of unknown  descent, and paternal ancestors are of unknown descent. There is no reported Ashkenazi Jewish ancestry. There is no known consanguinity.    GENETIC COUNSELING ASSESSMENT: Sheryl Michael is a 86 y.o. female with a personal and family history of breast cancer which is somewhat suggestive of a hereditary cancer syndrome and predisposition to cancer. We, therefore, discussed and recommended the following at today's visit.   DISCUSSION: We discussed that approximately 5-10% of breast cancer is hereditary  Most cases of hereditary breast cancer are associated with BRCA1/BRCA2 genes, although there are other genes associated with hereditary breast cancer as well including PALB2, CHEK2, ATM .  We discussed that testing is beneficial for several reasons including  knowing about other cancer risks, identifying potential screening and risk-reduction options that may be appropriate, and to understand if other family members could be at risk for cancer and allow them to undergo genetic testing.   We reviewed the characteristics, features and inheritance patterns of hereditary cancer syndromes. We also discussed genetic testing, including the appropriate family members to test, the process of testing, insurance coverage and turn-around-time for results. We discussed the implications of a negative, positive and/or variant of  uncertain significant result. We recommended Sheryl Michael pursue genetic testing for the CustomNext+RNA gene panel.   The CustomNext-Cancer + RNAinsight panel  includes sequencing and/or deletion duplication testing of the following 91 genes: AIP, ALK, APC*, ATM*, AXIN2, BAP1, BARD1, BLM, BMPR1A, BRCA1*, BRCA2*, BRIP1*, CDC73, CDH1*, CDK4, CDKN1B, CDKN2A, CHEK2*, CTNNA1, DICER1, FANCC, FH, FLCN, GALNT12, KIF1B, LZTR1, MAX, MEN1, MET, MLH1*, MRE11A, MSH2*, MSH3, MSH6*, MUTYH*, NBN, NF1*, NF2, NTHL1, PALB2*, PHOX2B, PMS2*, POT1, PRKAR1A, PTCH1, PTEN*, RAD50, RAD51C*, RAD51D*, RB1, RECQL, RET, SDHA, SDHAF2, SDHB, SDHC, SDHD, SMAD4, SMARCA4, SMARCB1, SMARCE1, STK11, SUFU, TMEM127, TP53*, TSC1, TSC2, VHL and XRCC2 (sequencing and deletion/duplication); CASR, CFTR, CPA1, CTRC, EGFR, EGLN1, FAM175A, HOXB13, KIT, MITF, MLH3, PALLD, PDGFRA, POLD1, POLE, PRSS1, RINT1, RPS20, SPINK1 and TERT (sequencing only); EPCAM and GREM1 (deletion/duplication only).   Based on Sheryl Michael's personal and family history of cancer, she meets medical criteria for genetic testing. Despite that she meets criteria, she may still have an out of pocket cost. We discussed that if her out of pocket cost for testing is over $100, the laboratory will call and confirm whether she wants to proceed with testing.  If the out of pocket cost of testing is less than $100 she will be billed by the genetic testing laboratory.   PLAN: Despite our recommendation, Sheryl Michael did not wish to pursue genetic testing at today's visit. We understand this decision and remain available to coordinate genetic testing at any time in the future. We, therefore, recommend Sheryl Michael continue to follow the cancer screening guidelines given by her primary healthcare provider.   Sheryl Michael questions were answered to her satisfaction today. Our contact information was provided should additional questions or concerns arise. Thank you for the referral and allowing [place] to  share in the care of your patient.   Faith Rogue, MS, [facility] Genetic Counselor [email or web address] Phone: [phone]  The patient was seen for a total of 30 minutes in face-to-face genetic counseling. Patient's daughter, Jackelyn Poling, was also present. Dr. Grayland Ormond was available for discussion regarding this case.   _______________________________________________________________________ For Office Staff:  Number of people involved in session: 2 Was an Intern/ student involved with case: no

## 2020-08-29 LAB — CULTURE, URINE COMPREHENSIVE

## 2020-08-31 ENCOUNTER — Telehealth: Payer: Self-pay

## 2020-08-31 MED ORDER — FOSFOMYCIN TROMETHAMINE 3 G PO PACK: 3.0000 g | PACK | Freq: Once | ORAL | 0 refills | Status: AC

## 2020-08-31 NOTE — Telephone Encounter (Signed)
Notified patient of ABX being sent to pharmacy. Patients daughter states she does not believe the fosfomycin is working to treat patients UTI. She would like to know is there an alternative. Explained to patients daughter this is based off of culture results. Please advise.

## 2020-08-31 NOTE — Telephone Encounter (Signed)
-----   Message from Bjorn Loser, MD sent at 08/31/2020  8:23 AM EST ----- Fosfomycin 3 gram sachet x 1 Then go back on daily keflex   ----- Message ----- From: Evelina Bucy, CMA Sent: 08/28/2020  11:49 AM EST To: Bjorn Loser, MD   ----- Message ----- From: Interface, [facility] Lab Results In Sent: 08/25/2020   4:37 PM EST To: [facility]

## 2020-08-31 NOTE — Telephone Encounter (Signed)
Explained to patients daughter, patient is being treated based off of culture results. Patients daughter states she is not giving the fosfomycin to patient again because doing so would result in the patient receiving fosfomycin for 3 months in a row with no UTI relief. Patients daughter states she will consult with patients PCP on this matter. Patient is scheduled with Dr Matilde Sprang for 09/21/20, I advised patients daughter to speak with Dr Matilde Sprang at that time.

## 2020-08-31 NOTE — Telephone Encounter (Signed)
Thanks for discussing with me and calling daughter

## 2020-09-02 ENCOUNTER — Ambulatory Visit: Payer: Medicare Other

## 2020-09-03 ENCOUNTER — Ambulatory Visit
Admission: RE | Admit: 2020-09-03 | Discharge: 2020-09-03 | Disposition: A | Discharge status: Home / Self Care | Payer: Medicare Other | Source: Ambulatory Visit | Attending: Radiation Oncology | Admitting: Radiation Oncology

## 2020-09-03 ENCOUNTER — Ambulatory Visit: Payer: Medicare Other

## 2020-09-03 DIAGNOSIS — Z510 Encounter for antineoplastic radiation therapy: Secondary | ICD-10-CM | POA: Insufficient documentation

## 2020-09-03 DIAGNOSIS — C50911 Malignant neoplasm of unspecified site of right female breast: Secondary | ICD-10-CM | POA: Diagnosis present

## 2020-09-04 ENCOUNTER — Ambulatory Visit: Payer: Medicare Other

## 2020-09-04 ENCOUNTER — Other Ambulatory Visit: Payer: Self-pay | Admitting: *Deleted

## 2020-09-04 DIAGNOSIS — Z170 Estrogen receptor positive status [ER+]: Secondary | ICD-10-CM

## 2020-09-04 DIAGNOSIS — C50512 Malignant neoplasm of lower-outer quadrant of left female breast: Secondary | ICD-10-CM

## 2020-09-07 ENCOUNTER — Ambulatory Visit: Payer: Medicare Other

## 2020-09-07 DIAGNOSIS — Z510 Encounter for antineoplastic radiation therapy: Secondary | ICD-10-CM | POA: Diagnosis not present

## 2020-09-08 ENCOUNTER — Ambulatory Visit: Payer: Medicare Other

## 2020-09-09 ENCOUNTER — Ambulatory Visit: Admission: RE | Admit: 2020-09-09 | Payer: Medicare Other | Source: Ambulatory Visit

## 2020-09-09 ENCOUNTER — Ambulatory Visit: Payer: Medicare Other

## 2020-09-09 DIAGNOSIS — Z510 Encounter for antineoplastic radiation therapy: Secondary | ICD-10-CM | POA: Diagnosis not present

## 2020-09-14 ENCOUNTER — Ambulatory Visit: Payer: Medicare Other

## 2020-09-14 ENCOUNTER — Ambulatory Visit
Admission: RE | Admit: 2020-09-14 | Discharge: 2020-09-14 | Disposition: A | Discharge status: Home / Self Care | Payer: Medicare Other | Source: Ambulatory Visit | Attending: Radiation Oncology | Admitting: Radiation Oncology

## 2020-09-14 DIAGNOSIS — Z510 Encounter for antineoplastic radiation therapy: Secondary | ICD-10-CM | POA: Diagnosis not present

## 2020-09-15 ENCOUNTER — Ambulatory Visit
Admission: RE | Admit: 2020-09-15 | Discharge: 2020-09-15 | Disposition: A | Discharge status: Home / Self Care | Payer: Medicare Other | Source: Ambulatory Visit | Attending: Radiation Oncology | Admitting: Radiation Oncology

## 2020-09-15 ENCOUNTER — Ambulatory Visit: Payer: Medicare Other

## 2020-09-15 DIAGNOSIS — Z510 Encounter for antineoplastic radiation therapy: Secondary | ICD-10-CM | POA: Diagnosis not present

## 2020-09-16 ENCOUNTER — Ambulatory Visit: Payer: Medicare Other

## 2020-09-16 ENCOUNTER — Ambulatory Visit
Admission: RE | Admit: 2020-09-16 | Discharge: 2020-09-16 | Disposition: A | Discharge status: Home / Self Care | Payer: Medicare Other | Source: Ambulatory Visit | Attending: Radiation Oncology | Admitting: Radiation Oncology

## 2020-09-16 DIAGNOSIS — Z510 Encounter for antineoplastic radiation therapy: Secondary | ICD-10-CM | POA: Insufficient documentation

## 2020-09-16 DIAGNOSIS — C50911 Malignant neoplasm of unspecified site of right female breast: Secondary | ICD-10-CM | POA: Insufficient documentation

## 2020-09-17 ENCOUNTER — Ambulatory Visit: Payer: Medicare Other

## 2020-09-17 ENCOUNTER — Ambulatory Visit
Admission: RE | Admit: 2020-09-17 | Discharge: 2020-09-17 | Disposition: A | Discharge status: Home / Self Care | Payer: Medicare Other | Source: Ambulatory Visit | Attending: Radiation Oncology | Admitting: Radiation Oncology

## 2020-09-17 DIAGNOSIS — Z510 Encounter for antineoplastic radiation therapy: Secondary | ICD-10-CM | POA: Diagnosis not present

## 2020-09-18 ENCOUNTER — Ambulatory Visit
Admission: RE | Admit: 2020-09-18 | Discharge: 2020-09-18 | Disposition: A | Discharge status: Home / Self Care | Payer: Medicare Other | Source: Ambulatory Visit | Attending: Radiation Oncology | Admitting: Radiation Oncology

## 2020-09-18 ENCOUNTER — Ambulatory Visit: Payer: Medicare Other

## 2020-09-18 DIAGNOSIS — Z510 Encounter for antineoplastic radiation therapy: Secondary | ICD-10-CM | POA: Diagnosis not present

## 2020-09-21 ENCOUNTER — Ambulatory Visit: Payer: Medicare Other

## 2020-09-21 ENCOUNTER — Ambulatory Visit: Payer: Medicare Other | Admitting: Urology

## 2020-09-21 ENCOUNTER — Ambulatory Visit
Admission: RE | Admit: 2020-09-21 | Discharge: 2020-09-21 | Disposition: A | Discharge status: Home / Self Care | Payer: Medicare Other | Source: Ambulatory Visit | Attending: Radiation Oncology | Admitting: Radiation Oncology

## 2020-09-21 ENCOUNTER — Other Ambulatory Visit: Payer: Self-pay

## 2020-09-21 ENCOUNTER — Encounter: Payer: Self-pay | Admitting: Urology

## 2020-09-21 VITALS — BP 110/56 | HR 63

## 2020-09-21 DIAGNOSIS — N390 Urinary tract infection, site not specified: Secondary | ICD-10-CM | POA: Diagnosis not present

## 2020-09-21 DIAGNOSIS — N3946 Mixed incontinence: Secondary | ICD-10-CM

## 2020-09-21 DIAGNOSIS — N3020 Other chronic cystitis without hematuria: Secondary | ICD-10-CM

## 2020-09-21 DIAGNOSIS — Z510 Encounter for antineoplastic radiation therapy: Secondary | ICD-10-CM | POA: Diagnosis not present

## 2020-09-21 LAB — URINALYSIS, COMPLETE
Bilirubin, UA: NEGATIVE
Glucose, UA: NEGATIVE
Ketones, UA: NEGATIVE
Nitrite, UA: NEGATIVE
Protein,UA: NEGATIVE
Specific Gravity, UA: 1.025 (ref 1.005–1.030)
Urobilinogen, Ur: 0.2 mg/dL (ref 0.2–1.0)
pH, UA: 6 (ref 5.0–7.5)

## 2020-09-21 LAB — MICROSCOPIC EXAMINATION
Bacteria, UA: NONE SEEN
WBC, UA: >30 /hpf — AB (ref 0–5)

## 2020-09-21 MED ORDER — CEPHALEXIN 250 MG PO CAPS: 250.0000 mg | ORAL_CAPSULE | Freq: Every day | ORAL | 3 refills | Status: DC

## 2020-09-21 NOTE — Progress Notes (Signed)
09/21/2020 10:23 AM   Sheryl Michael 1931/03/29 [id number]  Referring provider: Idelle Crouch, MD [facility] [place],  [place] [address]  Chief Complaint  Patient presents with  . Cysto    HPI: NP consult:Elderly patient with grade 3 cystocele who had an anterior repair in 2017 followed by [facility]. Has medical comorbidities on Eliquis. Has had multiple positive urine cultures. Recent CT scan normal kidneys.Failed pessary fitting and vaginectomy was recommended with cardiac clearance.Recently admitted for urosepsis. Patient was concerned about daily prophylaxis and resistance. Given estrogen cream at meatus to reduce infections.  Patient feels vaginal bulging. She sometimes reduces it. She has had a hysterectomy. Some of the details of the history were sometimes challenging  She leaks with coughing sneezing but not bending lifting. She can have urge incontinence. She wears 1 depends a day. No bedwetting. Voids twice a night and every 2-3 hours during the day  It appears that she has had hospitalization with sepsis 3 times in the last year with very little symptoms preceding this.  Patient was drawn. If the patient decides to proceed with surgery I recommend that the vaginectomy at [facility] was actually a good choice to shorten the time of her surgery with her medical comorbidities.  She understands prolapse is not related to her urinary tract infections. Role of prophylaxis discussed. Role of estrogen cream and probiotics discussed  Nausea with Macrodantin I believe has a sulfa allergy. Reassess in 8 weeks on daily Keflex. She has been having more trouble with atrial fibrillation and I think we all agree watchful waiting for prolapse is her final decision  On August 27 patient saw a nurse practitioner with 1 week history of occasional dysuria and frequency and was not using estrogen cream. CT scan Mar 05, 2020 normal when she was in  the hospital last time. Residual 110 mL.Poor historian.Was given fosfomycin and encouraged to use estrogen. Culture was positive on that day  Using estrogen on daily Keflex  Patient last seen July 13, 2020 on daily Keflex.  Called in today wishing to be seen The last 2 cultures with Pseudomonas have been resistant to antibiotics but sensitive to ciprofloxacin.  She is allergic to ciprofloxacin and gets a rash.  Still uses probiotics.  Her main symptom was increased frequency.  She also would have urgency then cannot urinate that well.  She said it comes and goes and is a bit worse than usual.  No foul-smelling urine or cloudiness.  No fever  Check through her medical records and because she is becoming resistant and having breakthrough infections perhaps for completion I will perform cystoscopy in 6 weeks.  I could not find out for sure if she had this at [facility].  She could not recall for certain.  It probably will not change her management.  Call with culture results and stay on Keflex.  TOday Frequency stable.  No fever.  No burning.  No cloudy urine.  Incontinence stable  Patient had a positive culture once again with allergy and resistant issues.  She was given fosfomycin again and her family had noted on the phone that this is been tried before and were frustrated.  Once again on November 11 she had Pseudomonas in her urine that was pansensitive to multiple medications but the only oral was ciprofloxacin.  Cystoscopy: Patient underwent flexible cystoscopy.  She had mild bladder erythema in keeping with cystitis.  Urine was quite cloudy with white flecks.  Trigone normal.  No carcinoma or foreign body.  Tolerated the procedure well.  Urine sent for culture recognizing it likely will be Pseudomonas colonization.  PMH: Past Medical History:  Diagnosis Date  . Arthritis   . Atrial fibrillation ([place])   . Breast cancer ([place]) 2006   right breast ca with lumpectomy and rad tx  .  Colon polyp   . Complication of anesthesia    questions about husband who passed in 2012   . Cystocele   . Depression   . Dysrhythmia   . Family history of breast cancer   . Female bladder prolapse   . GERD (gastroesophageal reflux disease)   . Goiter   . Hyperlipemia   . Hypertension   . Hypothyroidism   . Osteoporosis   . Personal history of radiation therapy 2006   right breast ca  . Pneumonia   . Procidentia of uterus   . Recurrent UTI   . Reflux   . TIA (transient ischemic attack)   . Vaginal atrophy     Surgical History: Past Surgical History:  Procedure Laterality Date  . APPENDECTOMY    . BREAST BIOPSY Right 2006   breast cancer  . BREAST LUMPECTOMY Right 2006   positive  . BREAST SURGERY Right    lumpectomy  . CATARACT EXTRACTION W/ INTRAOCULAR LENS  IMPLANT, BILATERAL    . COLONOSCOPY    . CYSTOCELE REPAIR N/A 12/21/2015   Procedure: ANTERIOR REPAIR (CYSTOCELE);  Surgeon: Brayton Mars, MD;  Location: [facility] ORS;  Service: Gynecology;  Laterality: N/A;  . DILATION AND CURETTAGE OF UTERUS    . ELECTROMAGNETIC NAVIGATION BROCHOSCOPY Right 12/13/2018   Procedure: ELECTROMAGNETIC NAVIGATION BRONCHOSCOPY;  Surgeon: Flora Lipps, MD;  Location: [facility] ORS;  Service: Cardiopulmonary;  Laterality: Right;  . EYE SURGERY    . PARTIAL MASTECTOMY WITH NEEDLE LOCALIZATION AND AXILLARY SENTINEL LYMPH NODE BX Left 08/03/2020   Procedure: PARTIAL MASTECTOMY WITH Radio Frequency tag AND AXILLARY SENTINEL LYMPH NODE BX;  Surgeon: Herbert Pun, MD;  Location: [facility] ORS;  Service: General;  Laterality: Left;  Marland Kitchen VAGINAL HYSTERECTOMY Bilateral 12/21/2015   Procedure: TVH BSO;  Surgeon: Brayton Mars, MD;  Location: [facility] ORS;  Service: Gynecology;  Laterality: Bilateral;    Home Medications:  Allergies as of 09/21/2020      Reactions   Bactrim [sulfamethoxazole-trimethoprim] Hives   Iodinated Diagnostic Agents Anaphylaxis   Codeine Nausea And Vomiting    Erythromycin Ethylsuccinate Other (See Comments)   Unknown   Phenobarbital Other (See Comments)   Feeling funny, nervous   Ciprofloxacin Rash   Penicillins Rash      Medication List       Accurate as of September 21, 2020 10:23 AM. If you have any questions, ask your nurse or doctor.        acetaminophen 325 MG tablet Commonly known as: TYLENOL Take 650 mg by mouth every 6 (six) hours as needed (pain.).   apixaban 2.5 MG Tabs tablet Commonly known as: ELIQUIS Take 1 tablet (2.5 mg total) by mouth 2 (two) times daily.   Calcium Carbonate-Vitamin D 600-200 MG-UNIT Tabs Take 1 tablet by mouth daily.   cephALEXin 250 MG capsule Commonly known as: KEFLEX Take 1 capsule (250 mg total) by mouth daily.   cyanocobalamin 1000 MCG/ML injection Commonly known as: (VITAMIN B-12) Inject 1,000 mcg into the muscle every 28 (twenty-eight) days.   diltiazem 120 MG 24 hr capsule Commonly known as: CARDIZEM CD Take 1 capsule (120 mg total) by mouth daily.  escitalopram 10 MG tablet Commonly known as: LEXAPRO Take 10 mg by mouth daily.   feeding supplement Liqd Take 237 mLs by mouth 2 (two) times daily between meals.   levothyroxine 50 MCG tablet Commonly known as: SYNTHROID Take 50 mcg by mouth daily before breakfast.   nitrofurantoin (macrocrystal-monohydrate) 100 MG capsule Commonly known as: MACROBID Take by mouth.   omeprazole 20 MG capsule Commonly known as: PRILOSEC Take 20 mg by mouth daily.   Probiotic Acidophilus Caps Take 1 capsule by mouth in the morning and at bedtime.       Allergies:  Allergies  Allergen Reactions  . Bactrim [Sulfamethoxazole-Trimethoprim] Hives  . Iodinated Diagnostic Agents Anaphylaxis  . Codeine Nausea And Vomiting  . Erythromycin Ethylsuccinate Other (See Comments)    Unknown   . Phenobarbital Other (See Comments)    Feeling funny, nervous  . Ciprofloxacin Rash  . Penicillins Rash    Family History: Family History  Problem  Relation Age of Onset  . Diabetes Sister   . Breast cancer Sister        late 30's  . Diabetes Brother   . Diabetes Sister     Social History:  reports that she has never smoked. She has never used smokeless tobacco. She reports that she does not drink alcohol and does not use drugs.  ROS:                                        Physical Exam: BP (!) 110/56   Pulse 63   Constitutional:  Alert and oriented, No acute distress.  Laboratory Data: Lab Results  Component Value Date   WBC 7.3 05/07/2020   HGB 11.1 (L) 05/07/2020   HCT 33.7 (L) 05/07/2020   MCV 92.8 05/07/2020   PLT 244 05/07/2020    Lab Results  Component Value Date   CREATININE 0.95 05/07/2020    No results found for: PSA  No results found for: TESTOSTERONE  Lab Results  Component Value Date   HGBA1C 5.5 04/15/2013    Urinalysis    Component Value Date/Time   COLORURINE YELLOW (A) 05/04/2020 2154   APPEARANCEUR Clear 08/25/2020 1041   LABSPEC 1.012 05/04/2020 2154   PHURINE 5.0 05/04/2020 2154   GLUCOSEU Negative 08/25/2020 1041   [place] 05/04/2020 2154   BILIRUBINUR Negative 08/25/2020 [place] NEGATIVE 05/04/2020 2154   PROTEINUR Negative 08/25/2020 [place] NEGATIVE 05/04/2020 2154   NITRITE Negative 08/25/2020 1041   NITRITE NEGATIVE 05/04/2020 2154   LEUKOCYTESUR Negative 08/25/2020 1041   LEUKOCYTESUR SMALL (A) 05/04/2020 2154    Pertinent Imaging:   Assessment & Plan: Patient has chronic cystitis.  Unfortunately she does not have good options in terms of treating the positive cultures.  I believe she is chronically colonized and is controversial whether or not she is to stay on the daily Keflex.  I thought it was reasonable to do so since it may keep her out of the hospital.  Treatment tools are limited.  I had a lengthy conversation with the patient and her daughter and I think they understand very well that regardless of treatment  intravenously or orally the culture will soon come back as positive.  I encourage them not to treat mild symptoms and not to get cultures.  She had a culture last week by another doctor.  Her daughter wants her to stay  on daily Keflex and 90x3 sent.  See in 1 year.  We talked about limited treatment tools if we are asked to treat the patient.  They understand that medical admissions for urinary tract infections are generally done by hospitalist.  Referral to infectious disease was spoken about  1. Chronic cystitis  - Urinalysis, Complete   No follow-ups on file.  Reece Packer, MD  [facility] [address], [place], [place] [address] [phone]

## 2020-09-22 ENCOUNTER — Ambulatory Visit: Payer: Medicare Other

## 2020-09-22 ENCOUNTER — Ambulatory Visit
Admission: RE | Admit: 2020-09-22 | Discharge: 2020-09-22 | Disposition: A | Discharge status: Home / Self Care | Payer: Medicare Other | Source: Ambulatory Visit | Attending: Radiation Oncology | Admitting: Radiation Oncology

## 2020-09-22 DIAGNOSIS — Z510 Encounter for antineoplastic radiation therapy: Secondary | ICD-10-CM | POA: Diagnosis not present

## 2020-09-23 ENCOUNTER — Ambulatory Visit: Payer: Medicare Other

## 2020-09-23 ENCOUNTER — Ambulatory Visit
Admission: RE | Admit: 2020-09-23 | Discharge: 2020-09-23 | Disposition: A | Discharge status: Home / Self Care | Payer: Medicare Other | Source: Ambulatory Visit | Attending: Radiation Oncology | Admitting: Radiation Oncology

## 2020-09-23 DIAGNOSIS — Z510 Encounter for antineoplastic radiation therapy: Secondary | ICD-10-CM | POA: Diagnosis not present

## 2020-09-24 ENCOUNTER — Ambulatory Visit
Admission: RE | Admit: 2020-09-24 | Discharge: 2020-09-24 | Disposition: A | Discharge status: Home / Self Care | Payer: Medicare Other | Source: Ambulatory Visit | Attending: Radiation Oncology | Admitting: Radiation Oncology

## 2020-09-24 ENCOUNTER — Ambulatory Visit: Payer: Medicare Other

## 2020-09-24 DIAGNOSIS — Z510 Encounter for antineoplastic radiation therapy: Secondary | ICD-10-CM | POA: Diagnosis not present

## 2020-09-25 ENCOUNTER — Ambulatory Visit
Admission: RE | Admit: 2020-09-25 | Discharge: 2020-09-25 | Disposition: A | Discharge status: Home / Self Care | Payer: Medicare Other | Source: Ambulatory Visit | Attending: Radiation Oncology | Admitting: Radiation Oncology

## 2020-09-25 ENCOUNTER — Ambulatory Visit: Payer: Medicare Other

## 2020-09-25 DIAGNOSIS — Z510 Encounter for antineoplastic radiation therapy: Secondary | ICD-10-CM | POA: Diagnosis not present

## 2020-09-28 ENCOUNTER — Inpatient Hospital Stay: Payer: Medicare Other | Attending: Internal Medicine | Admitting: Internal Medicine

## 2020-09-28 ENCOUNTER — Ambulatory Visit
Admission: RE | Admit: 2020-09-28 | Discharge: 2020-09-28 | Disposition: A | Discharge status: Home / Self Care | Payer: Medicare Other | Source: Ambulatory Visit | Attending: Radiation Oncology | Admitting: Radiation Oncology

## 2020-09-28 ENCOUNTER — Ambulatory Visit: Payer: Medicare Other

## 2020-09-28 ENCOUNTER — Other Ambulatory Visit: Payer: Self-pay

## 2020-09-28 DIAGNOSIS — Z9012 Acquired absence of left breast and nipple: Secondary | ICD-10-CM | POA: Insufficient documentation

## 2020-09-28 DIAGNOSIS — Z7901 Long term (current) use of anticoagulants: Secondary | ICD-10-CM | POA: Diagnosis not present

## 2020-09-28 DIAGNOSIS — C50512 Malignant neoplasm of lower-outer quadrant of left female breast: Secondary | ICD-10-CM | POA: Insufficient documentation

## 2020-09-28 DIAGNOSIS — I4891 Unspecified atrial fibrillation: Secondary | ICD-10-CM | POA: Insufficient documentation

## 2020-09-28 DIAGNOSIS — Z803 Family history of malignant neoplasm of breast: Secondary | ICD-10-CM | POA: Diagnosis not present

## 2020-09-28 DIAGNOSIS — Z510 Encounter for antineoplastic radiation therapy: Secondary | ICD-10-CM | POA: Diagnosis not present

## 2020-09-28 DIAGNOSIS — Z170 Estrogen receptor positive status [ER+]: Secondary | ICD-10-CM | POA: Diagnosis not present

## 2020-09-28 DIAGNOSIS — Z780 Asymptomatic menopausal state: Secondary | ICD-10-CM | POA: Insufficient documentation

## 2020-09-28 DIAGNOSIS — Z87440 Personal history of urinary (tract) infections: Secondary | ICD-10-CM | POA: Diagnosis not present

## 2020-09-28 NOTE — Progress Notes (Signed)
[facility] NOTE  Patient Care Team: Idelle Crouch, MD as PCP - General (Internal Medicine) Noreene Filbert, MD as Radiation Oncologist (Radiation Oncology)  CHIEF COMPLAINTS/PURPOSE OF CONSULTATION: Breast cancer  #  Oncology History Overview Note  #Right breast cancer [2006; Dr.Choksi]-s/p lumpectomy followed by radiation; ? Endocrine therapy.   # AUG 2021- LEFT BREAST INVASIVE LOBULAR CA wREPEAT ER-POSITIVE; PR-NEGATIVE' /Her-2 NEU- POSITIVE s/p LUMPECTOMY; pT1a;pN0 [ stage I]  # A.fib- on eliuiqs [Dr.callwood]; frequent UTIs [[facility] ]; COVID in dec 2021.   # SURVIVORSHIP:   # GENETICS:   DIAGNOSIS: left breast cancer  STAGE:    I     ;  GOALS: cure  CURRENT/MOST RECENT THERAPY : RT    Carcinoma of lower-outer quadrant of left breast in female, estrogen receptor positive ([place])  07/23/2020 Initial Diagnosis   Carcinoma of lower-outer quadrant of left breast in female, estrogen receptor negative ([place])      HISTORY OF PRESENTING ILLNESS:  Sheryl Michael 86 y.o.  female patient with left-sided breast cancer status post surgery is here for follow-up.  Patient is currently undergoing radiation to end of December 2021.  Patient tolerating radiation fairly well.  Mild rash in the area of radiation portal.  Review of Systems  Constitutional: Negative for chills, diaphoresis, fever, malaise/fatigue and weight loss.  HENT: Negative for nosebleeds and sore throat.   Eyes: Negative for double vision.  Respiratory: Negative for cough, hemoptysis, sputum production, shortness of breath and wheezing.   Cardiovascular: Negative for chest pain, palpitations, orthopnea and leg swelling.  Gastrointestinal: Negative for abdominal pain, blood in stool, constipation, diarrhea, heartburn, melena, nausea and vomiting.  Genitourinary: Negative for dysuria, frequency and urgency.  Musculoskeletal: Positive for back pain and joint pain.  Skin: Negative.   Negative for itching and rash.  Neurological: Negative for dizziness, tingling, focal weakness, weakness and headaches.  Endo/Heme/Allergies: Does not bruise/bleed easily.  Psychiatric/Behavioral: Negative for depression. The patient is not nervous/anxious and does not have insomnia.      MEDICAL HISTORY:  Past Medical History:  Diagnosis Date  . Arthritis   . Atrial fibrillation ([place])   . Breast cancer ([place]) 2006   right breast ca with lumpectomy and rad tx  . Colon polyp   . Complication of anesthesia    questions about husband who passed in 2012   . Cystocele   . Depression   . Dysrhythmia   . Family history of breast cancer   . Female bladder prolapse   . GERD (gastroesophageal reflux disease)   . Goiter   . Hyperlipemia   . Hypertension   . Hypothyroidism   . Osteoporosis   . Personal history of radiation therapy 2006   right breast ca  . Pneumonia   . Procidentia of uterus   . Recurrent UTI   . Reflux   . TIA (transient ischemic attack)   . Vaginal atrophy     SURGICAL HISTORY: Past Surgical History:  Procedure Laterality Date  . APPENDECTOMY    . BREAST BIOPSY Right 2006   breast cancer  . BREAST LUMPECTOMY Right 2006   positive  . BREAST SURGERY Right    lumpectomy  . CATARACT EXTRACTION W/ INTRAOCULAR LENS  IMPLANT, BILATERAL    . COLONOSCOPY    . CYSTOCELE REPAIR N/A 12/21/2015   Procedure: ANTERIOR REPAIR (CYSTOCELE);  Surgeon: Brayton Mars, MD;  Location: [facility] ORS;  Service: Gynecology;  Laterality: N/A;  . DILATION AND CURETTAGE OF UTERUS    .  ELECTROMAGNETIC NAVIGATION BROCHOSCOPY Right 12/13/2018   Procedure: ELECTROMAGNETIC NAVIGATION BRONCHOSCOPY;  Surgeon: Flora Lipps, MD;  Location: [facility] ORS;  Service: Cardiopulmonary;  Laterality: Right;  . EYE SURGERY    . PARTIAL MASTECTOMY WITH NEEDLE LOCALIZATION AND AXILLARY SENTINEL LYMPH NODE BX Left 08/03/2020   Procedure: PARTIAL MASTECTOMY WITH Radio Frequency tag AND AXILLARY SENTINEL LYMPH  NODE BX;  Surgeon: Herbert Pun, MD;  Location: [facility] ORS;  Service: General;  Laterality: Left;  Marland Kitchen VAGINAL HYSTERECTOMY Bilateral 12/21/2015   Procedure: TVH BSO;  Surgeon: Brayton Mars, MD;  Location: [facility] ORS;  Service: Gynecology;  Laterality: Bilateral;    SOCIAL HISTORY: Social History   Socioeconomic History  . Marital status: Widowed    Spouse name: Not on file  . Number of children: Not on file  . Years of education: Not on file  . Highest education level: Not on file  Occupational History  . Not on file  Tobacco Use  . Smoking status: Never Smoker  . Smokeless tobacco: Never Used  Vaping Use  . Vaping Use: Never used  Substance and Sexual Activity  . Alcohol use: No  . Drug use: No  . Sexual activity: Never    Birth control/protection: Post-menopausal  Other Topics Concern  . Not on file  Social History Narrative  . Not on file   Social Determinants of Health   Financial Resource Strain: Not on file  Food Insecurity: Not on file  Transportation Needs: Not on file  Physical Activity: Not on file  Stress: Not on file  Social Connections: Not on file  Intimate Partner Violence: Not on file    FAMILY HISTORY: Family History  Problem Relation Age of Onset  . Diabetes Sister   . Breast cancer Sister        late 3's  . Diabetes Brother   . Diabetes Sister     ALLERGIES:  is allergic to bactrim [sulfamethoxazole-trimethoprim], iodinated diagnostic agents, codeine, erythromycin ethylsuccinate, phenobarbital, ciprofloxacin, and penicillins.  MEDICATIONS:  Current Outpatient Medications  Medication Sig Dispense Refill  . acetaminophen (TYLENOL) 325 MG tablet Take 650 mg by mouth every 6 (six) hours as needed (pain.).     Marland Kitchen apixaban (ELIQUIS) 2.5 MG TABS tablet Take 1 tablet (2.5 mg total) by mouth 2 (two) times daily. 60 tablet 0  . Calcium Carbonate-Vitamin D 600-200 MG-UNIT TABS Take 1 tablet by mouth daily.     . cephALEXin (KEFLEX) 250 MG  capsule Take 1 capsule (250 mg total) by mouth daily. 90 capsule 3  . cyanocobalamin (,VITAMIN B-12,) 1000 MCG/ML injection Inject 1,000 mcg into the muscle every 28 (twenty-eight) days.    Marland Kitchen diltiazem (CARDIZEM CD) 120 MG 24 hr capsule Take 1 capsule (120 mg total) by mouth daily. 30 capsule 0  . escitalopram (LEXAPRO) 10 MG tablet Take 10 mg by mouth daily.     . feeding supplement, ENSURE ENLIVE, (ENSURE ENLIVE) LIQD Take 237 mLs by mouth 2 (two) times daily between meals. 237 mL 12  . Lactobacillus (PROBIOTIC ACIDOPHILUS) CAPS Take 1 capsule by mouth in the morning and at bedtime.    Marland Kitchen levothyroxine (SYNTHROID, LEVOTHROID) 50 MCG tablet Take 50 mcg by mouth daily before breakfast.    . omeprazole (PRILOSEC) 20 MG capsule Take 20 mg by mouth daily.     Current Facility-Administered Medications  Medication Dose Route Frequency Provider Last Rate Last Admin  . fosfomycin (MONUROL) packet 3 g  3 g Oral Once Bjorn Loser, MD          .  PHYSICAL EXAMINATION: ECOG PERFORMANCE STATUS: 0 - Asymptomatic  Vitals:   09/28/20 1530  BP: (!) 125/51  Pulse: 63  Resp: 18  Temp: (!) 96.6 F (35.9 C)   There were no vitals filed for this visit.  Physical Exam Constitutional:      Comments: Patient is ambulating independently. Accompanied by daughter.  HENT:     Head: Normocephalic and atraumatic.     Mouth/Throat:     Pharynx: No oropharyngeal exudate.  Eyes:     Pupils: Pupils are equal, round, and reactive to light.  Cardiovascular:     Rate and Rhythm: Normal rate and regular rhythm.  Pulmonary:     Effort: Pulmonary effort is normal. No respiratory distress.     Breath sounds: No wheezing.  Abdominal:     General: Bowel sounds are normal. There is no distension.     Palpations: Abdomen is soft. There is no mass.     Tenderness: There is no abdominal tenderness. There is no guarding or rebound.  Musculoskeletal:        General: No tenderness. Normal range of motion.      Cervical back: Normal range of motion and neck supple.  Skin:    General: Skin is warm.  Neurological:     Mental Status: She is alert and oriented to person, place, and time.  Psychiatric:        Mood and Affect: Affect normal.      LABORATORY DATA:  I have reviewed the data as listed Lab Results  Component Value Date   WBC 5.9 09/30/2020   HGB 13.1 09/30/2020   HCT 40.0 09/30/2020   MCV 93.7 09/30/2020   PLT 223 09/30/2020   Recent Labs    03/04/20 2253 03/05/20 0418 05/04/20 1823 05/05/20 0434 05/06/20 0458 05/07/20 0503  NA 135   < > 139 140 139  --   K 4.1   < > 3.7 4.0 4.2  --   CL 103   < > 103 108 109  --   CO2 23   < > _0 --   GLUCOSE 120*   < > 113* 133* 82  --   BUN 16   < > _1 --   CREATININE 1.09*   < > 1.23* 1.06* 0.91 0.95  CALCIUM 8.9   < > 8.9 8.4* 8.4*  --   GFRNONAA 46*   < > 39* 47* 57* 54*  GFRAA 53*   < > 46* 55* >60 >60  PROT 7.1  --   --   --   --   --   ALBUMIN 3.5  --   --   --   --   --   AST 17  --   --   --   --   --   ALT 12  --   --   --   --   --   ALKPHOS 68  --   --   --   --   --   BILITOT 0.8  --   --   --   --   --    < > = values in this interval not displayed.    RADIOGRAPHIC STUDIES: I have personally reviewed the radiological images as listed and agreed with the findings in the report. No results found.  ASSESSMENT & PLAN:   Carcinoma of lower-outer quadrant of left breast in female, estrogen receptor positive ([place]) #Clinical stage  I- LEFT BREAST INVASIVE MAMMARY CA with  LOBULAR CANCER-  pT1a [29m;pN0 ];  ER-POSITIVE; PR- NEG; Her-2- POSITIVE. HOLD adjuvant chemo. On RT [until 12/28].  #Discussed the mechanism of action of aromatase inhibitors-with blocking of estrogen to prevent breast cancer.  Patient not a candidate for chemotherapy/Herceptin. #A. Fib-on Eliquis/amiodarone-stable  #History of UTIs- on prophylaxis Kelfex; [facility]  Stable  #Genetic counseling: Given 2  separate primaries/metachronous breast cancer I would recommend evaluation with genetic counseling.  Patient has 2 daughters s/p counselling declines genetic testing.   # DISPOSITION: # follow up in end of Jan 2022;  Dr.B    All questions were answered. The patient/family knows to call the clinic with any problems, questions or concerns.    GCammie Sickle MD 10/06/2020 11:02 PM

## 2020-09-28 NOTE — Assessment & Plan Note (Addendum)
#  Clinical stage I- LEFT BREAST INVASIVE MAMMARY CA with  LOBULAR CANCER-  pT1a [64m;pN0 ];  ER-POSITIVE; PR- NEG; Her-2- POSITIVE. HOLD adjuvant chemo. On RT [until 12/28].  #Discussed the mechanism of action of aromatase inhibitors-with blocking of estrogen to prevent breast cancer.  Patient not a candidate for chemotherapy/Herceptin. #A. Fib-on Eliquis/amiodarone-stable  #History of UTIs- on prophylaxis Kelfex; [facility]  Stable  #Genetic counseling: Given 2 separate primaries/metachronous breast cancer I would recommend evaluation with genetic counseling.  Patient has 2 daughters s/p counselling declines genetic testing.   # DISPOSITION: # follow up in end of Jan 2022;  Dr.B

## 2020-09-29 ENCOUNTER — Ambulatory Visit: Payer: Medicare Other

## 2020-09-29 ENCOUNTER — Ambulatory Visit
Admission: RE | Admit: 2020-09-29 | Discharge: 2020-09-29 | Disposition: A | Discharge status: Home / Self Care | Payer: Medicare Other | Source: Ambulatory Visit | Attending: Radiation Oncology | Admitting: Radiation Oncology

## 2020-09-29 DIAGNOSIS — Z510 Encounter for antineoplastic radiation therapy: Secondary | ICD-10-CM | POA: Diagnosis not present

## 2020-09-30 ENCOUNTER — Other Ambulatory Visit: Payer: Self-pay

## 2020-09-30 ENCOUNTER — Ambulatory Visit
Admission: RE | Admit: 2020-09-30 | Discharge: 2020-09-30 | Disposition: A | Discharge status: Home / Self Care | Payer: Medicare Other | Source: Ambulatory Visit | Attending: Radiation Oncology | Admitting: Radiation Oncology

## 2020-09-30 ENCOUNTER — Inpatient Hospital Stay: Payer: Medicare Other

## 2020-09-30 ENCOUNTER — Ambulatory Visit: Payer: Medicare Other

## 2020-09-30 DIAGNOSIS — Z170 Estrogen receptor positive status [ER+]: Secondary | ICD-10-CM

## 2020-09-30 DIAGNOSIS — C50512 Malignant neoplasm of lower-outer quadrant of left female breast: Secondary | ICD-10-CM | POA: Diagnosis not present

## 2020-09-30 DIAGNOSIS — Z510 Encounter for antineoplastic radiation therapy: Secondary | ICD-10-CM | POA: Diagnosis not present

## 2020-09-30 LAB — CBC
HCT: 40 % (ref 36.0–46.0)
Hemoglobin: 13.1 g/dL (ref 12.0–15.0)
MCH: 30.7 pg (ref 26.0–34.0)
MCHC: 32.8 g/dL (ref 30.0–36.0)
MCV: 93.7 fL (ref 80.0–100.0)
Platelets: 223 10*3/uL (ref 150–400)
RBC: 4.27 MIL/uL (ref 3.87–5.11)
RDW: 14.4 % (ref 11.5–15.5)
WBC: 5.9 10*3/uL (ref 4.0–10.5)
nRBC: 0 % (ref 0.0–0.2)

## 2020-10-01 ENCOUNTER — Ambulatory Visit
Admission: RE | Admit: 2020-10-01 | Discharge: 2020-10-01 | Disposition: A | Discharge status: Home / Self Care | Payer: Medicare Other | Source: Ambulatory Visit | Attending: Radiation Oncology | Admitting: Radiation Oncology

## 2020-10-01 ENCOUNTER — Ambulatory Visit: Payer: Medicare Other

## 2020-10-01 DIAGNOSIS — Z510 Encounter for antineoplastic radiation therapy: Secondary | ICD-10-CM | POA: Diagnosis not present

## 2020-10-02 ENCOUNTER — Ambulatory Visit: Payer: Medicare Other

## 2020-10-02 ENCOUNTER — Ambulatory Visit
Admission: RE | Admit: 2020-10-02 | Discharge: 2020-10-02 | Disposition: A | Discharge status: Home / Self Care | Payer: Medicare Other | Source: Ambulatory Visit | Attending: Radiation Oncology | Admitting: Radiation Oncology

## 2020-10-02 DIAGNOSIS — Z510 Encounter for antineoplastic radiation therapy: Secondary | ICD-10-CM | POA: Diagnosis not present

## 2020-10-05 ENCOUNTER — Ambulatory Visit
Admission: RE | Admit: 2020-10-05 | Discharge: 2020-10-05 | Disposition: A | Discharge status: Home / Self Care | Payer: Medicare Other | Source: Ambulatory Visit | Attending: Radiation Oncology | Admitting: Radiation Oncology

## 2020-10-05 ENCOUNTER — Ambulatory Visit: Payer: Medicare Other

## 2020-10-05 DIAGNOSIS — Z510 Encounter for antineoplastic radiation therapy: Secondary | ICD-10-CM | POA: Diagnosis not present

## 2020-10-06 ENCOUNTER — Ambulatory Visit
Admission: RE | Admit: 2020-10-06 | Discharge: 2020-10-06 | Disposition: A | Discharge status: Home / Self Care | Payer: Medicare Other | Source: Ambulatory Visit | Attending: Radiation Oncology | Admitting: Radiation Oncology

## 2020-10-06 DIAGNOSIS — Z510 Encounter for antineoplastic radiation therapy: Secondary | ICD-10-CM | POA: Diagnosis not present

## 2020-10-07 ENCOUNTER — Ambulatory Visit
Admission: RE | Admit: 2020-10-07 | Discharge: 2020-10-07 | Disposition: A | Discharge status: Home / Self Care | Payer: Medicare Other | Source: Ambulatory Visit | Attending: Radiation Oncology | Admitting: Radiation Oncology

## 2020-10-07 DIAGNOSIS — Z510 Encounter for antineoplastic radiation therapy: Secondary | ICD-10-CM | POA: Diagnosis not present

## 2020-10-08 ENCOUNTER — Ambulatory Visit
Admission: RE | Admit: 2020-10-08 | Discharge: 2020-10-08 | Disposition: A | Discharge status: Home / Self Care | Payer: Medicare Other | Source: Ambulatory Visit | Attending: Radiation Oncology | Admitting: Radiation Oncology

## 2020-10-08 DIAGNOSIS — Z510 Encounter for antineoplastic radiation therapy: Secondary | ICD-10-CM | POA: Diagnosis not present

## 2020-10-12 ENCOUNTER — Ambulatory Visit
Admission: RE | Admit: 2020-10-12 | Discharge: 2020-10-12 | Disposition: A | Discharge status: Home / Self Care | Payer: Medicare Other | Source: Ambulatory Visit | Attending: Radiation Oncology | Admitting: Radiation Oncology

## 2020-10-12 DIAGNOSIS — Z510 Encounter for antineoplastic radiation therapy: Secondary | ICD-10-CM | POA: Diagnosis not present

## 2020-10-13 ENCOUNTER — Ambulatory Visit
Admission: RE | Admit: 2020-10-13 | Discharge: 2020-10-13 | Disposition: A | Discharge status: Home / Self Care | Payer: Medicare Other | Source: Ambulatory Visit | Attending: Radiation Oncology | Admitting: Radiation Oncology

## 2020-10-13 ENCOUNTER — Other Ambulatory Visit: Payer: Self-pay | Admitting: Licensed Clinical Social Worker

## 2020-10-13 DIAGNOSIS — Z510 Encounter for antineoplastic radiation therapy: Secondary | ICD-10-CM | POA: Diagnosis not present

## 2020-10-13 DIAGNOSIS — Z87440 Personal history of urinary (tract) infections: Secondary | ICD-10-CM

## 2020-10-13 MED ORDER — FOSFOMYCIN TROMETHAMINE 3 G PO PACK: 3.0000 g | PACK | Freq: Once | ORAL | 0 refills | Status: AC

## 2020-10-14 ENCOUNTER — Ambulatory Visit: Payer: Medicare Other

## 2020-11-09 ENCOUNTER — Inpatient Hospital Stay: Payer: Medicare Other | Admitting: Internal Medicine

## 2020-11-10 ENCOUNTER — Inpatient Hospital Stay: Payer: Medicare Other | Attending: Internal Medicine | Admitting: Internal Medicine

## 2020-11-10 DIAGNOSIS — C50512 Malignant neoplasm of lower-outer quadrant of left female breast: Secondary | ICD-10-CM

## 2020-11-10 DIAGNOSIS — Z170 Estrogen receptor positive status [ER+]: Secondary | ICD-10-CM | POA: Diagnosis not present

## 2020-11-10 DIAGNOSIS — M810 Age-related osteoporosis without current pathological fracture: Secondary | ICD-10-CM

## 2020-11-10 MED ORDER — ANASTROZOLE 1 MG PO TABS: 1.0000 mg | ORAL_TABLET | Freq: Every day | ORAL | 2 refills | Status: DC

## 2020-11-10 NOTE — Progress Notes (Signed)
I connected with Sheryl Michael on 11/10/20 at  9:15 AM EST by video enabled telemedicine visit and verified that I am speaking with the correct person using two identifiers.  I discussed the limitations, risks, security and privacy concerns of performing an evaluation and management service by telemedicine and the availability of in-person appointments. I also discussed with the patient that there may be a patient responsible charge related to this service. The patient expressed understanding and agreed to proceed.    Other persons participating in the visit and their role in the encounter: RN/medical reconciliation Patient's location: home Provider's location: office  Oncology History Overview Note  #Right breast cancer [2006; Dr.Choksi]-s/p lumpectomy followed by radiation; ? Endocrine therapy.   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  # JAN 25th, 2022-start anastrozole  #2016 bone density osteoporosis  # A.fib- on eliuiqs [Dr.callwood]; frequent UTIs [[facility] ]; COVID in dec 2021.   # SURVIVORSHIP:   # GENETICS:   DIAGNOSIS: left breast cancer  STAGE:    I     ;  GOALS: cure  CURRENT/MOST RECENT THERAPY : RT    Carcinoma of lower-outer quadrant of left breast in female, estrogen receptor positive ([name])  07/23/2020 Initial Diagnosis   Carcinoma of lower-outer quadrant of left breast in female, estrogen receptor negative ([name])      Chief Complaint: breast cancer  Patient is hard of hearing; accompanied by daughter Sheryl Michael History of present illness:Sheryl Michael 87 y.o.  female with history of stage I ER/PR positive HER-2 positive breast cancer is here for follow-up.  In the interim patient finished radiation approximately a month ago.  Noted to have radiation changes/erythema in the radiation portal.  Patient has been  using moisturizing lotions.  Overall is getting better not any worse.  As per the daughter patient continues to have frequent UTIs for which she is being evaluated by urology.  Observation/objective: No acute distress.  Accompanied by daughter.  Assessment and plan: Carcinoma of lower-outer quadrant of left breast in female, estrogen receptor positive ([name]) #Clinical stage I- LEFT BREAST INVASIVE MAMMARY CA with  LOBULAR CANCER-  pT1a [57m;pN0 ];  ER-POSITIVE; PR- NEG; Her-2- POSITIVE.  Not a candidate for adjuvant chemotherapy.  #Proceed with anastrozole. Discussed the mechanism of action of aromatase inhibitors-with blocking of estrogen to prevent breast cancer.  Also discussed the potential side effects including but not limited to arthralgias hot flashes and increased risk of osteoporosis [see below].   # Radiation dermatitis-continue moisturizing lotion.  Overall improving.  If not any better inform us-recommend evaluation in the clinic.  # OSTEPOROSIS [2016]-recommend Ca+ 1000/ 2000 vitD; recommend repeating baseline bone density test-prior to next visit.  #A. Fib-on Eliquis/amiodarone- STABLE.   #History of UTIs- on prophylaxis Kelfex-awaiting evaluation with urology   # DISPOSITION: # follow up in 2 months- Virtual MD; BMD prior- Dr.B      Follow-up instructions:  I discussed the assessment and treatment plan with the patient.  The patient was provided an opportunity to ask questions and all were answered.  The patient agreed with the plan and demonstrated understanding of instructions.  The patient was advised to call back or seek an in person evaluation if the symptoms worsen or if the condition fails to improve as anticipated.  Dr. [facility]aine [facility]1/25/2022 1:10 PM

## 2020-11-10 NOTE — Assessment & Plan Note (Addendum)
#  Clinical stage I- LEFT BREAST INVASIVE MAMMARY CA with  LOBULAR CANCER-  pT1a [67m;pN0 ];  ER-POSITIVE; PR- NEG; Her-2- POSITIVE.  Not a candidate for adjuvant chemotherapy.  #Proceed with anastrozole. Discussed the mechanism of action of aromatase inhibitors-with blocking of estrogen to prevent breast cancer.  Also discussed the potential side effects including but not limited to arthralgias hot flashes and increased risk of osteoporosis [see below].   # Radiation dermatitis-continue moisturizing lotion.  Overall improving.  If not any better inform us-recommend evaluation in the clinic.  # OSTEPOROSIS [2016]-recommend Ca+ 1000/ 2000 vitD; recommend repeating baseline bone density test-prior to next visit.  #A. Fib-on Eliquis/amiodarone- STABLE.   #History of UTIs- on prophylaxis Kelfex-awaiting evaluation with urology   # DISPOSITION: # follow up in 2 months- Virtual MD; BMD prior- Dr.B

## 2020-11-18 ENCOUNTER — Ambulatory Visit
Admission: RE | Admit: 2020-11-18 | Discharge: 2020-11-18 | Disposition: A | Discharge status: Home / Self Care | Payer: Medicare Other | Source: Ambulatory Visit | Attending: Radiation Oncology | Admitting: Radiation Oncology

## 2020-11-18 ENCOUNTER — Other Ambulatory Visit: Payer: Self-pay

## 2020-11-18 DIAGNOSIS — C50512 Malignant neoplasm of lower-outer quadrant of left female breast: Secondary | ICD-10-CM | POA: Insufficient documentation

## 2020-11-18 DIAGNOSIS — Z170 Estrogen receptor positive status [ER+]: Secondary | ICD-10-CM | POA: Insufficient documentation

## 2020-11-18 NOTE — Progress Notes (Signed)
Radiation Oncology Follow up Note  Name: Sheryl Michael   Date:   11/18/2020 MRN:  [id number] DOB: 23-Apr-1931   Radiation Oncology TeleHEALTH VISIT PROGRESS NOTE  I connected with      Sheryl Michael  by telephone-[place] and verified that I am speaking with the correct person using two identifiers.  I discussed the limitations, risks, security and privacy concerns of performing an evaluation and management service by telemedicine and the availability of in-person appointments. I also discussed with the patient that there may be a patient responsible charge related to this service. The patient expressed understanding and agreed to proceed.    Other persons participating in the visit and their role in the encounter:    Patient's location: Home   Provider's location: work  This 87 y.o. female presents on video conference call for 1 month follow-up status post whole breast radiation for stage I ER positive PR negative HER-2/neu overexpressed of the right breast  REFERRING PROVIDER: Idelle Crouch, MD  HPI: Patient is a 87 year old female now out 1 month having completed whole breast radiation to her right breast and breast that had previously been treated for invasive mammary carcinoma back in 2006.  Her tumor was ER positive PR negative and HER-2/neu overexpressed.  She is doing well her skin reaction has subsided..  She has been started on anastrozole tolerating well although daughter reports that she is quite fatigued.  Patient also has A. fib is on Eliquis.  COMPLICATIONS OF TREATMENT: none  FOLLOW UP COMPLIANCE: keeps appointments   PHYSICAL EXAM:  There were no vitals taken for this visit. No exam was performed as this was a telephone visit  RADIOLOGY RESULTS: No current films to review  PLAN: Present time based on the patient's age and mobility will turn follow-up care over to Dr. Jacinto Reap.  I would be happy to reevaluate the patient anytime should further oncology input be needed.  She  continues on anastrozole.  Patient and daughter knows to call with any concerns.  I would like to take this opportunity to thank you for allowing me to participate in the care of your patient.Sheryl Filbert, MD

## 2020-11-26 ENCOUNTER — Ambulatory Visit
Admission: RE | Admit: 2020-11-26 | Discharge: 2020-11-26 | Disposition: A | Discharge status: Home / Self Care | Payer: Medicare Other | Source: Ambulatory Visit | Attending: Internal Medicine | Admitting: Internal Medicine

## 2020-11-26 ENCOUNTER — Other Ambulatory Visit: Payer: Self-pay

## 2020-11-26 DIAGNOSIS — Z170 Estrogen receptor positive status [ER+]: Secondary | ICD-10-CM | POA: Insufficient documentation

## 2020-11-26 DIAGNOSIS — C50512 Malignant neoplasm of lower-outer quadrant of left female breast: Secondary | ICD-10-CM | POA: Insufficient documentation

## 2020-11-26 DIAGNOSIS — M810 Age-related osteoporosis without current pathological fracture: Secondary | ICD-10-CM | POA: Insufficient documentation

## 2020-12-31 ENCOUNTER — Other Ambulatory Visit: Payer: Self-pay | Admitting: Orthopedic Surgery

## 2020-12-31 DIAGNOSIS — M25511 Pain in right shoulder: Secondary | ICD-10-CM

## 2021-01-03 ENCOUNTER — Ambulatory Visit
Admission: RE | Admit: 2021-01-03 | Discharge: 2021-01-03 | Disposition: A | Discharge status: Home / Self Care | Payer: Medicare Other | Source: Ambulatory Visit | Attending: Orthopedic Surgery | Admitting: Orthopedic Surgery

## 2021-01-03 ENCOUNTER — Other Ambulatory Visit: Payer: Self-pay

## 2021-01-03 DIAGNOSIS — M25511 Pain in right shoulder: Secondary | ICD-10-CM | POA: Insufficient documentation

## 2021-01-05 ENCOUNTER — Inpatient Hospital Stay: Payer: Medicare Other | Attending: Internal Medicine | Admitting: Internal Medicine

## 2021-01-05 DIAGNOSIS — C50512 Malignant neoplasm of lower-outer quadrant of left female breast: Secondary | ICD-10-CM

## 2021-01-05 DIAGNOSIS — Z170 Estrogen receptor positive status [ER+]: Secondary | ICD-10-CM | POA: Diagnosis not present

## 2021-01-05 NOTE — Progress Notes (Signed)
I connected with Sheryl Michael on 01/05/21 at  3:15 PM EDT by video enabled telemedicine visit and verified that I am speaking with the correct person using two identifiers.  I discussed the limitations, risks, security and privacy concerns of performing an evaluation and management service by telemedicine and the availability of in-person appointments. I also discussed with the patient that there may be a patient responsible charge related to this service. The patient expressed understanding and agreed to proceed.    Other persons participating in the visit and their role in the encounter: RN/medical reconciliation Patient's location: home Provider's location: office  Oncology History Overview Note  #Right breast cancer [2006; Dr.Choksi]-s/p lumpectomy followed by radiation; ? Endocrine therapy.   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  # JAN 25th, 2022-start anastrozole  #2016 bone density osteoporosis  # A.fib- on eliuiqs [Dr.callwood]; frequent UTIs [[facility] ]; COVID in dec 2021.   # SURVIVORSHIP:   # GENETICS:   DIAGNOSIS: left breast cancer  STAGE:    I     ;  GOALS: cure  CURRENT/MOST RECENT THERAPY : RT    Carcinoma of lower-outer quadrant of left breast in female, estrogen receptor positive ([place])  07/23/2020 Initial Diagnosis   Carcinoma of lower-outer quadrant of left breast in female, estrogen receptor negative (Sheryl Michael)      Chief Complaint: *Breast cancer   History of present illness:Sheryl Michael 87 y.o.  female with history of stage I triple positive breast cancer-currently on adjuvant anastrozole/is here today with results of her bone density test.  Patient did not receive chemotherapy/Herceptin because of her age risk of complications.  Patient is currently anastrozole.  Denies any worsening joint pains or  new bone pain.  Denies any falls.  Observation/objective: Alert & oriented x 3. In No acute distress.  She is accompanied by her son.  Assessment and plan: Carcinoma of lower-outer quadrant of left breast in female, estrogen receptor positive ([name]) # LEFT BREAST INVASIVE MAMMARY CA with  LOBULAR CANCER-ER-POSITIVE; PR- NEG; Her-2- POSITIVE.  Not a candidate for adjuvant chemotherapy.  Currently on adjuvant anastrozole.  Stable.  #Continue anastrozole.  Tolerating well without any major side effects.  # OSTEPOROSIS [2016]-recommend Ca+ 1000/ 2000 vitD; 2022-BMD measured at [facility] L1-L3 is 0.795 g/cm2 with a T-score of -3.2. Marland KitchenDiscussed the potential risk factors for osteoporosis- age/gender/postmenopausal status/use of anti-estrogen treatments.  calcium and vitamin D supplementation/ and also use of bisphosphonates.  Discussed use of Reclast on a yearly basis.  Potential benefits and/side effects  Including but not limited to Osteonecrosis of jaw/ hypocalcemia.  Patient will decide next visit.;  Will order today.  #A. Fib-on Eliquis/amiodarone-stable  #History of UTIs- on prophylaxis Kelfex-[urology.]   # DISPOSITION: # follow up in 3 months-; MD; labs- cbc/bmp; Possible reclast-Dr.B    Follow-up instructions:  I discussed the assessment and treatment plan with the patient.  The patient was provided an opportunity to ask questions and all were answered.  The patient agreed with the plan and demonstrated understanding of instructions.  The patient was advised to call back or seek an in person evaluation if the symptoms worsen or if the condition fails to improve as anticipated.  Dr. Charlaine Dalton [place] at [facility] 01/05/2021 4:13 PM

## 2021-01-05 NOTE — Assessment & Plan Note (Addendum)
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  Not a candidate for adjuvant chemotherapy.  Currently on adjuvant anastrozole.  Stable.  #Continue anastrozole.  Tolerating well without any major side effects.  # OSTEPOROSIS [2016]-recommend Ca+ 1000/ 2000 vitD; 2022-BMD measured at [facility] L1-L3 is 0.795 g/cm2 with a T-score of -3.2. Marland KitchenDiscussed the potential risk factors for osteoporosis- age/gender/postmenopausal status/use of anti-estrogen treatments.  calcium and vitamin D supplementation/ and also use of bisphosphonates.  Discussed use of Reclast on a yearly basis.  Potential benefits and/side effects  Including but not limited to Osteonecrosis of jaw/ hypocalcemia.  Patient will decide next visit.;  Will order today.  #A. Fib-on Eliquis/amiodarone-stable  #History of UTIs- on prophylaxis Kelfex-[urology.]   # DISPOSITION: # follow up in 3 months-; MD; labs- cbc/bmp; Possible reclast-Dr.B

## 2021-03-05 ENCOUNTER — Other Ambulatory Visit: Payer: Self-pay | Admitting: Internal Medicine

## 2021-03-08 ENCOUNTER — Encounter: Payer: Self-pay | Admitting: Internal Medicine

## 2021-03-11 ENCOUNTER — Other Ambulatory Visit: Payer: Self-pay | Admitting: Physician Assistant

## 2021-03-11 DIAGNOSIS — R911 Solitary pulmonary nodule: Secondary | ICD-10-CM

## 2021-03-12 ENCOUNTER — Ambulatory Visit
Admission: RE | Admit: 2021-03-12 | Discharge: 2021-03-12 | Disposition: A | Discharge status: Home / Self Care | Payer: Medicare Other | Source: Ambulatory Visit | Attending: Physician Assistant | Admitting: Physician Assistant

## 2021-03-12 ENCOUNTER — Other Ambulatory Visit: Payer: Self-pay

## 2021-03-12 DIAGNOSIS — R911 Solitary pulmonary nodule: Secondary | ICD-10-CM | POA: Insufficient documentation

## 2021-04-05 ENCOUNTER — Telehealth: Payer: Self-pay | Admitting: *Deleted

## 2021-04-05 NOTE — Telephone Encounter (Signed)
Sheryl Michael called reporting that patient had a chest xray and wanted to see if Dr B wants to look at results to discuss at apt Wednesday. She is asking if anything needs to be done regarding the findings.  IMPRESSION: 1. There is new irregular and ground-glass opacity of the anterior left upper lobe and lingula underlying the left breast, generally consistent with subacute radiation pneumonitis and developing radiation fibrosis. 2. Redemonstrated, predominantly bandlike and irregular subpleural radiation fibrosis of the anterior right middle lobe and right upper lobe. 3. No specific findings of the lungs to explain cough. 4. There are new findings of left lumpectomy and skin thickening of the left breast, likely related to interval radiation therapy. 5. Coronary artery disease.   Aortic Atherosclerosis (ICD10-I70.0).     Electronically Signed   By: Eddie Candle M.D.   On: 03/12/2021 10:01

## 2021-04-06 ENCOUNTER — Encounter: Payer: Self-pay | Admitting: Internal Medicine

## 2021-04-06 ENCOUNTER — Telehealth: Payer: Self-pay | Admitting: Internal Medicine

## 2021-04-06 NOTE — Telephone Encounter (Signed)
Dr. B - please advise. 

## 2021-04-06 NOTE — Telephone Encounter (Signed)
On 6/21-unable to reach patient left voicemail to discuss results of the chest x-ray tomorrow visit.

## 2021-04-07 ENCOUNTER — Other Ambulatory Visit: Payer: Self-pay

## 2021-04-07 ENCOUNTER — Inpatient Hospital Stay: Payer: Medicare Other

## 2021-04-07 ENCOUNTER — Inpatient Hospital Stay: Payer: Medicare Other | Attending: Internal Medicine

## 2021-04-07 ENCOUNTER — Other Ambulatory Visit: Payer: Self-pay | Admitting: *Deleted

## 2021-04-07 ENCOUNTER — Encounter: Payer: Self-pay | Admitting: Internal Medicine

## 2021-04-07 ENCOUNTER — Inpatient Hospital Stay (HOSPITAL_BASED_OUTPATIENT_CLINIC_OR_DEPARTMENT_OTHER): Payer: Medicare Other | Admitting: Internal Medicine

## 2021-04-07 DIAGNOSIS — Z87440 Personal history of urinary (tract) infections: Secondary | ICD-10-CM | POA: Insufficient documentation

## 2021-04-07 DIAGNOSIS — Z7901 Long term (current) use of anticoagulants: Secondary | ICD-10-CM | POA: Insufficient documentation

## 2021-04-07 DIAGNOSIS — Z780 Asymptomatic menopausal state: Secondary | ICD-10-CM | POA: Diagnosis not present

## 2021-04-07 DIAGNOSIS — M810 Age-related osteoporosis without current pathological fracture: Secondary | ICD-10-CM | POA: Diagnosis not present

## 2021-04-07 DIAGNOSIS — I4891 Unspecified atrial fibrillation: Secondary | ICD-10-CM | POA: Insufficient documentation

## 2021-04-07 DIAGNOSIS — C50512 Malignant neoplasm of lower-outer quadrant of left female breast: Secondary | ICD-10-CM

## 2021-04-07 DIAGNOSIS — Z170 Estrogen receptor positive status [ER+]: Secondary | ICD-10-CM

## 2021-04-07 DIAGNOSIS — R918 Other nonspecific abnormal finding of lung field: Secondary | ICD-10-CM | POA: Diagnosis not present

## 2021-04-07 DIAGNOSIS — Z803 Family history of malignant neoplasm of breast: Secondary | ICD-10-CM | POA: Insufficient documentation

## 2021-04-07 LAB — CBC WITH DIFFERENTIAL/PLATELET
Abs Immature Granulocytes: 0.02 10*3/uL (ref 0.00–0.07)
Basophils Absolute: 0 10*3/uL (ref 0.0–0.1)
Basophils Relative: 1 %
Eosinophils Absolute: 0.3 10*3/uL (ref 0.0–0.5)
Eosinophils Relative: 7 %
HCT: 36.6 % (ref 36.0–46.0)
Hemoglobin: 11.9 g/dL — ABNORMAL LOW (ref 12.0–15.0)
Immature Granulocytes: 0 %
Lymphocytes Relative: 27 %
Lymphs Abs: 1.3 10*3/uL (ref 0.7–4.0)
MCH: 31.9 pg (ref 26.0–34.0)
MCHC: 32.5 g/dL (ref 30.0–36.0)
MCV: 98.1 fL (ref 80.0–100.0)
Monocytes Absolute: 0.5 10*3/uL (ref 0.1–1.0)
Monocytes Relative: 10 %
Neutro Abs: 2.8 10*3/uL (ref 1.7–7.7)
Neutrophils Relative %: 55 %
Platelets: 230 10*3/uL (ref 150–400)
RBC: 3.73 MIL/uL — ABNORMAL LOW (ref 3.87–5.11)
RDW: 14.3 % (ref 11.5–15.5)
WBC: 5 10*3/uL (ref 4.0–10.5)
nRBC: 0 % (ref 0.0–0.2)

## 2021-04-07 LAB — BASIC METABOLIC PANEL
Anion gap: 7 (ref 5–15)
BUN: 13 mg/dL (ref 8–23)
CO2: 26 mmol/L (ref 22–32)
Calcium: 8.9 mg/dL (ref 8.9–10.3)
Chloride: 106 mmol/L (ref 98–111)
Creatinine, Ser: 0.97 mg/dL (ref 0.44–1.00)
GFR, Estimated: 57 mL/min — ABNORMAL LOW (ref >60)
Glucose, Bld: 137 mg/dL — ABNORMAL HIGH (ref 70–99)
Potassium: 4.4 mmol/L (ref 3.5–5.1)
Sodium: 139 mmol/L (ref 135–145)

## 2021-04-07 NOTE — Assessment & Plan Note (Addendum)
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  Not a candidate for adjuvant chemotherapy.  Currently on adjuvant anastrozole.  Stable.  Tolerating current medical therapy.  Continue anastrozole.  # OSTEOPOROSIS: 2022-BMD measured at [facility] L1-L3 is 0.795 g/cm2 with a T-score of -3.2. Marland KitchenDiscussed the potential risk factors for osteoporosis- age/gender/postmenopausal status/use of anti-estrogen treatments.  calcium and vitamin D supplementation/ and also use of bisphosphonates.  Discussed use of Reclast on a yearly basis.  Potential benefits and/side effects  Including but not limited to Osteonecrosis of jaw/ hypocalcemia.  Patient reluctant anxious wants to hold off Reclast today.  # Left lingular infltrate- likley radiation induced.  Currently symptoms resolved s/p antibiotic/prednisone.  Right middle lobe infiltrative lesion-significantly improved compared to 2020.  Patient had a bronc with Dr. Mortimer Fries in February 2020 [Ln -NEG for malignancy].  Reviewed imaging independently/and with family.  Given her age frailty I think is reasonable to hold off any further work-up with pulmonary.  #A. Fib-on Eliquis/amiodarone-stable  #History of UTIs- on prophylaxis Kelfex/doxycycline-[urology.]  #Genetics: Patient is interested in genetic blood draw discussed with genetic counselor.  We will draw blood today.  # DISPOSITION: # HOLD reclast # follow up in 4 months-; MD; labs- cbc/bmp; Possible reclast-Dr.B

## 2021-04-07 NOTE — Progress Notes (Signed)
[facility] [place] NOTE  Patient Care Team: Idelle Crouch, MD as PCP - General (Internal Medicine) Cammie Sickle, MD as Consulting Physician (Internal Medicine) Herbert Pun, MD as Consulting Physician (General Surgery) Noreene Filbert, MD as Radiation Oncologist (Radiation Oncology)  CHIEF COMPLAINTS/PURPOSE OF CONSULTATION: Breast cancer  #  Oncology History Overview Note  #Right breast cancer [2006; Dr.Choksi]-s/p lumpectomy followed by radiation; ? Endocrine therapy.   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  # JAN 25th, 2022-start anastrozole  #2016 bone density osteoporosis  # A.fib- on eliuiqs [Dr.callwood]; frequent UTIs [[facility] ]; COVID in dec 2021.   # SURVIVORSHIP:   # GENETICS:   DIAGNOSIS: left breast cancer  STAGE:    I     ;  GOALS: cure  CURRENT/MOST RECENT THERAPY : RT    Carcinoma of lower-outer quadrant of left breast in female, estrogen receptor positive ([place])  07/23/2020 Initial Diagnosis   Carcinoma of lower-outer quadrant of left breast in female, estrogen receptor negative ([place])      HISTORY OF PRESENTING ILLNESS:  Sheryl Michael 87 y.o.  female patient with left-sided breast cancer status post surgery is here for follow-up.  Interim patient had UTI for which she was been treated by Keflex/doxycycline.  Currently awaiting on cultures.  Patient had episode of cough for which she was treated with prednisone and antibiotics.  CT scan in late May showed infiltrate in the left lung lingular.  Otherwise denies any headache nausea vomiting. Review of Systems  Constitutional:  Negative for chills, diaphoresis, fever, malaise/fatigue and weight loss.  HENT:  Negative for nosebleeds and sore throat.   Eyes:  Negative for double vision.  Respiratory:  Negative for  cough, hemoptysis, sputum production, shortness of breath and wheezing.   Cardiovascular:  Negative for chest pain, palpitations, orthopnea and leg swelling.  Gastrointestinal:  Negative for abdominal pain, blood in stool, constipation, diarrhea, heartburn, melena, nausea and vomiting.  Genitourinary:  Negative for dysuria, frequency and urgency.  Musculoskeletal:  Positive for back pain and joint pain.  Skin: Negative.  Negative for itching and rash.  Neurological:  Negative for dizziness, tingling, focal weakness, weakness and headaches.  Endo/Heme/Allergies:  Does not bruise/bleed easily.  Psychiatric/Behavioral:  Negative for depression. The patient is not nervous/anxious and does not have insomnia.     MEDICAL HISTORY:  Past Medical History:  Diagnosis Date   Arthritis    Atrial fibrillation ([place])    Breast cancer ([place]) 2006   right breast ca with lumpectomy and rad tx   Colon polyp    Complication of anesthesia    questions about husband who passed in 2012    Cystocele    Depression    Dysrhythmia    Family history of breast cancer    Female bladder prolapse    GERD (gastroesophageal reflux disease)    Goiter    Hyperlipemia    Hypertension    Hypothyroidism    Osteoporosis    Personal history of radiation therapy 2006   right breast ca   Pneumonia    Procidentia of uterus    Recurrent UTI    Reflux    TIA (transient ischemic attack)    Vaginal atrophy     SURGICAL HISTORY: Past Surgical History:  Procedure Laterality Date   APPENDECTOMY     BREAST BIOPSY Right 2006   breast cancer  BREAST LUMPECTOMY Right 2006   positive   BREAST SURGERY Right    lumpectomy   CATARACT EXTRACTION W/ INTRAOCULAR LENS  IMPLANT, BILATERAL     COLONOSCOPY     CYSTOCELE REPAIR N/A 12/21/2015   Procedure: ANTERIOR REPAIR (CYSTOCELE);  Surgeon: Brayton Mars, MD;  Location: [facility] OR[facility];  Service: Gynecology;  Laterality: N/A;   DILATION AND CURETTAGE OF UTERUS      ELECTROMAGNETIC NAVIGATION BROCHOSCOPY Right 12/13/2018   Procedure: ELECTROMAGNETIC NAVIGATION BRONCHOSCOPY;  Surgeon: Flora Lipps, MD;  Location: [facility];  Service: Cardiopulmonary;  Laterality: Right;   EYE SURGERY     PARTIAL MASTECTOMY WITH NEEDLE LOCALIZATION AND AXILLARY SENTINEL LYMPH NODE BX Left 08/03/2020   Procedure: PARTIAL MASTECTOMY WITH Radio Frequency tag AND AXILLARY SENTINEL LYMPH NODE BX;  Surgeon: Herbert Pun, MD;  Location: [facility] OR[facility];  Service: General;  Laterality: Left;   VAGINAL HYSTERECTOMY Bilateral 12/21/2015   Procedure: TVH BSO;  Surgeon: Brayton Mars, MD;  Location: [facility];  Service: Gynecology;  Laterality: Bilateral;    SOCIAL HISTORY: Social History   Socioeconomic History   Marital status: Widowed    Spouse name: Not on file   Number of children: Not on file   Years of education: Not on file   Highest education level: Not on file  Occupational History   Not on file  Tobacco Use   Smoking status: Never   Smokeless tobacco: Never  Vaping Use   Vaping Use: Never used  Substance and Sexual Activity   Alcohol use: No   Drug use: No   Sexual activity: Never    Birth control/protection: Post-menopausal  Other Topics Concern   Not on file  Social History Narrative   Not on file   Social Determinants of Health   Financial Resource Strain: Not on file  Food Insecurity: Not on file  Transportation Needs: Not on file  Physical Activity: Not on file  Stress: Not on file  Social Connections: Not on file  Intimate Partner Violence: Not on file    FAMILY HISTORY: Family History  Problem Relation Age of Onset   Diabetes Sister    Breast cancer Sister        late 83's   Diabetes Brother    Diabetes Sister     ALLERGIES:  is allergic to bactrim [sulfamethoxazole-trimethoprim], iodinated diagnostic agents, codeine, erythromycin ethylsuccinate, phenobarbital, ciprofloxacin, and penicillins.  MEDICATIONS:  Current Outpatient  Medications  Medication Sig Dispense Refill   acetaminophen (TYLENOL) 325 MG tablet Take 650 mg by mouth every 6 (six) hours as needed (pain.).      anastrozole (ARIMIDEX) 1 MG tablet TAKE 1 TABLET BY MOUTH EVERY DAY 90 tablet 2   apixaban (ELIQUIS) 2.5 MG TABS tablet Take 1 tablet (2.5 mg total) by mouth 2 (two) times daily. 60 tablet 0   Calcium Carbonate-Vitamin D 600-200 MG-UNIT TABS Take 1 tablet by mouth daily.      cephALEXin (KEFLEX) 250 MG capsule Take 1 capsule (250 mg total) by mouth daily. 90 capsule 3   cyanocobalamin (,VITAMIN B-12,) 1000 MCG/ML injection Inject 1,000 mcg into the muscle every 28 (twenty-eight) days.     diltiazem (CARDIZEM CD) 120 MG 24 hr capsule Take 1 capsule (120 mg total) by mouth daily. 30 capsule 0   doxycycline (VIBRAMYCIN) 100 MG capsule Take 1 mg by mouth 2 (two) times daily.     escitalopram (LEXAPRO) 10 MG tablet Take 10 mg by mouth daily.      feeding  supplement, ENSURE ENLIVE, (ENSURE ENLIVE) LIQD Take 237 mLs by mouth 2 (two) times daily between meals. 237 mL 12   fosfomycin (MONUROL) 3 g PACK Take 3 g by mouth once.     Lactobacillus (PROBIOTIC ACIDOPHILUS) CAPS Take 1 capsule by mouth in the morning and at bedtime.     levothyroxine (SYNTHROID, LEVOTHROID) 50 MCG tablet Take 50 mcg by mouth daily before breakfast.     omeprazole (PRILOSEC) 20 MG capsule Take 20 mg by mouth daily.     Current Facility-Administered Medications  Medication Dose Route Frequency Provider Last Rate Last Admin   fosfomycin (MONUROL) packet 3 g  3 g Oral Once Bjorn Loser, MD          .  PHYSICAL EXAMINATION: ECOG PERFORMANCE STATUS: 0 - Asymptomatic  Vitals:   04/07/21 1306  BP: (!) 136/50  Pulse: 65  Resp: 17  SpO2: 100%   Filed Weights   04/07/21 1306  Weight: 126 lb 8 oz (57.4 kg)    Physical Exam Constitutional:      Comments: Patient is ambulating independently. Accompanied by daughter.  HENT:     Head: Normocephalic and atraumatic.      Mouth/Throat:     Pharynx: No oropharyngeal exudate.  Eyes:     Pupils: Pupils are equal, round, and reactive to light.  Cardiovascular:     Rate and Rhythm: Normal rate and regular rhythm.  Pulmonary:     Effort: Pulmonary effort is normal. No respiratory distress.     Breath sounds: No wheezing.  Abdominal:     General: Bowel sounds are normal. There is no distension.     Palpations: Abdomen is soft. There is no mass.     Tenderness: no abdominal tenderness There is no guarding or rebound.  Musculoskeletal:        General: No tenderness. Normal range of motion.     Cervical back: Normal range of motion and neck supple.  Skin:    General: Skin is warm.  Neurological:     Mental Status: She is alert and oriented to person, place, and time.  Psychiatric:        Mood and Affect: Affect normal.     LABORATORY DATA:  I have reviewed the data as listed Lab Results  Component Value Date   WBC 5.0 04/07/2021   HGB 11.9 (L) 04/07/2021   HCT 36.6 04/07/2021   MCV 98.1 04/07/2021   PLT 230 04/07/2021   Recent Labs    05/05/20 0434 05/06/20 0458 05/07/20 0503 04/07/21 1242  NA 140 139  --  139  K 4.0 4.2  --  4.4  CL 108 109  --  106  CO2 25 23  --  26  GLUCOSE 133* 82  --  137*  BUN 18 16  --  13  CREATININE 1.06* 0.91 0.95 0.97  CALCIUM 8.4* 8.4*  --  8.9  GFRNONAA 47* 57* 54* 57*  GFRAA 55* >60 >60  --     RADIOGRAPHIC STUDIES: I have personally reviewed the radiological images as listed and agreed with the findings in the report. CT CHEST WO CONTRAST  Result Date: 03/12/2021 CLINICAL DATA:  Cough, shortness of breath for 2 months, history of bilateral breast cancer, nonsmoker EXAM: CT CHEST WITHOUT CONTRAST TECHNIQUE: Multidetector CT imaging of the chest was performed following the standard protocol without IV contrast. COMPARISON:  12/13/2018 FINDINGS: Cardiovascular: Aortic atherosclerosis. Normal heart size. Scattered left and right coronary artery  calcifications. No pericardial  effusion. Mediastinum/Nodes: No enlarged mediastinal, hilar, or axillary lymph nodes. Small hiatal hernia. Thyroid gland, trachea, and esophagus demonstrate no significant findings. Lungs/Pleura: There is new irregular and ground-glass opacity of the anterior left upper lobe and lingula underlying the left breast (series 3, image 47, 66). Redemonstrated, predominantly bandlike and irregular subpleural radiation fibrosis of the anterior right middle lobe and right upper lobe (series 3, image 73). Unchanged biapical pleuroparenchymal scarring. No pleural effusion or pneumothorax. Upper Abdomen: No acute abnormality. Musculoskeletal: No chest wall mass or suspicious bone lesions identified. Dystrophic calcification in the central right breast. There are new findings of left lumpectomy and skin thickening of the left breast, likely related to radiation therapy. IMPRESSION: 1. There is new irregular and ground-glass opacity of the anterior left upper lobe and lingula underlying the left breast, generally consistent with subacute radiation pneumonitis and developing radiation fibrosis. 2. Redemonstrated, predominantly bandlike and irregular subpleural radiation fibrosis of the anterior right middle lobe and right upper lobe. 3. No specific findings of the lungs to explain cough. 4. There are new findings of left lumpectomy and skin thickening of the left breast, likely related to interval radiation therapy. 5. Coronary artery disease. Aortic Atherosclerosis (ICD10-I70.0). Electronically Signed   By: Eddie Candle M.D.   On: 03/12/2021 10:01    ASSESSMENT & PLAN:   Carcinoma of lower-outer quadrant of left breast in female, estrogen receptor positive ([place]) # LEFT BREAST INVASIVE MAMMARY CA with  LOBULAR CANCER-ER-POSITIVE; PR- NEG; Her-2- POSITIVE.  Not a candidate for adjuvant chemotherapy.  Currently on adjuvant anastrozole.  Stable.  Tolerating current medical therapy.  Continue  anastrozole.  # OSTEOPOROSIS: 2022-BMD measured at [facility] L1-L3 is 0.795 g/cm2 with a T-score of -3.2. Marland KitchenDiscussed the potential risk factors for osteoporosis- age/gender/postmenopausal status/use of anti-estrogen treatments.  calcium and vitamin D supplementation/ and also use of bisphosphonates.  Discussed use of Reclast on a yearly basis.  Potential benefits and/side effects  Including but not limited to Osteonecrosis of jaw/ hypocalcemia.  Patient reluctant anxious wants to hold off Reclast today.  # Left lingular infltrate- likley radiation induced.  Currently symptoms resolved s/p antibiotic/prednisone.  Right middle lobe infiltrative lesion-significantly improved compared to 2020.  Patient had a bronc with Dr. Mortimer Fries in February 2020 [Ln -NEG for malignancy].  Reviewed imaging independently/and with family.  Given her age frailty I think is reasonable to hold off any further work-up with pulmonary.  #A. Fib-on Eliquis/amiodarone-stable  #History of UTIs- on prophylaxis Kelfex/doxycycline-[urology.]  #Genetics: Patient is interested in genetic blood draw discussed with genetic counselor.  We will draw blood today.  # DISPOSITION: # HOLD reclast # follow up in 4 months-; MD; labs- cbc/bmp; Possible reclast-Dr.B  All questions were answered. The patient/family knows to call the clinic with any problems, questions or concerns.    Cammie Sickle, MD 04/07/2021 2:28 PM

## 2021-04-28 ENCOUNTER — Ambulatory Visit: Payer: Medicare Other | Admitting: Radiation Oncology

## 2021-05-03 ENCOUNTER — Encounter: Payer: Self-pay | Admitting: Internal Medicine

## 2021-05-04 ENCOUNTER — Telehealth: Payer: Self-pay | Admitting: Licensed Clinical Social Worker

## 2021-05-04 ENCOUNTER — Encounter: Payer: Self-pay | Admitting: Licensed Clinical Social Worker

## 2021-05-04 ENCOUNTER — Ambulatory Visit: Payer: Self-pay | Admitting: Licensed Clinical Social Worker

## 2021-05-04 DIAGNOSIS — C50512 Malignant neoplasm of lower-outer quadrant of left female breast: Secondary | ICD-10-CM

## 2021-05-04 DIAGNOSIS — Z803 Family history of malignant neoplasm of breast: Secondary | ICD-10-CM

## 2021-05-04 DIAGNOSIS — Z853 Personal history of malignant neoplasm of breast: Secondary | ICD-10-CM

## 2021-05-04 DIAGNOSIS — Z1379 Encounter for other screening for genetic and chromosomal anomalies: Secondary | ICD-10-CM | POA: Insufficient documentation

## 2021-05-04 NOTE — Progress Notes (Signed)
HPI:  Ms. Guerin was previously seen in the [facility] due to a personal and family history of cancer and concerns regarding a hereditary predisposition to cancer. Please refer to our prior cancer genetics clinic note for more information regarding our discussion, assessment and recommendations, at the time. Ms. Thull recent genetic test results were disclosed to her, as were recommendations warranted by these results. These results and recommendations are discussed in more detail below.  CANCER HISTORY:  Oncology History Overview Note  #Right breast cancer [2006; Dr.Choksi]-s/p lumpectomy followed by radiation; ? Endocrine therapy.   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  # JAN 25th, 2022-start anastrozole  #2016 bone density osteoporosis  # A.fib- on eliuiqs [Dr.callwood]; frequent UTIs [[facility] ]; COVID in dec 2021.   # SURVIVORSHIP:   # GENETICS:   DIAGNOSIS: left breast cancer  STAGE:    I     ;  GOALS: cure  CURRENT/MOST RECENT THERAPY : RT    Carcinoma of lower-outer quadrant of left breast in female, estrogen receptor positive ([place])  07/23/2020 Initial Diagnosis   Carcinoma of lower-outer quadrant of left breast in female, estrogen receptor negative ([place])    Genetic Testing   Negative genetic testing. No pathogenic variants identified on the [facility] Multi-Cancer Panel +RNA. The report date is 04/24/2021.   The Multi-Cancer Panel + RNA offered by [facility] includes sequencing and/or deletion duplication testing of the following 84 genes: AIP, ALK, APC, ATM, AXIN2,BAP1,  BARD1, BLM, BMPR1A, BRCA1, BRCA2, BRIP1, CASR, CDC73, CDH1, CDK4, CDKN1B, CDKN1C, CDKN2A (p14ARF), CDKN2A (p16INK4a), CEBPA, CHEK2, CTNNA1, DICER1, DIS3L2, EGFR (c.2369C>T, p.Thr790Met variant only), EPCAM (Deletion/duplication testing  only), FH, FLCN, GATA2, GPC3, GREM1 (Promoter region deletion/duplication testing only), HOXB13 (c.251G>A, p.Gly84Glu), HRAS, KIT, MAX, MEN1, MET, MITF (c.952G>A, p.Glu318Lys variant only), MLH1, MSH2, MSH3, MSH6, MUTYH, NBN, NF1, NF2, NTHL1, PALB2, PDGFRA, PHOX2B, PMS2, POLD1, POLE, POT1, PRKAR1A, PTCH1, PTEN, RAD50, RAD51C, RAD51D, RB1, RECQL4, RET, RUNX1, SDHAF2, SDHA (sequence changes only), SDHB, SDHC, SDHD, SMAD4, SMARCA4, SMARCB1, SMARCE1, STK11, SUFU, TERC, TERT, TMEM127, TP53, TSC1, TSC2, VHL, WRN and WT1.     FAMILY HISTORY:  We obtained a detailed, 4-generation family history.  Significant diagnoses are listed below: Family History  Problem Relation Age of Onset   Diabetes Sister    Breast cancer Sister        late 10's   Diabetes Brother    Diabetes Sister    Ms. Negron has 2 daughters and 1 son, no cancers. Her daughter Jackelyn Poling was present for the session today. Ms. Biebel had 6 sisters and 3 brothers. One sister had breast cancer in her 62s and is living in her late 23s. Another sister had throat cancer. No cancers in nieces/nephews.   Ms. Auth mother died at 23. Patient had 4 maternal aunts, 1 uncle, no cancers. No known cancers in maternal cousins. No information about maternal grandparents.   Ms. Butrick father died at 68. Patient had 2 paternal aunts and 3 or 4 uncles, no known cancers. No known cancers in paternal cousins or grandparents.    Ms. Eddington is unaware of previous family history of genetic testing for hereditary cancer risks. Patient's maternal ancestors are of unknown descent, and paternal ancestors are of unknown descent. There is no reported Ashkenazi Jewish ancestry. There is no known consanguinity.       GENETIC TEST RESULTS: Genetic testing  reported out on 04/24/2021 through the [facility] Multi-Cancer+RNA cancer panel found no pathogenic mutations.   The CancerNext-Expanded + RNAinsight gene panel offered by [employer] and includes sequencing and  rearrangement analysis for the following 77 genes: IP, ALK, APC*, ATM*, AXIN2, BAP1, BARD1, BLM, BMPR1A, BRCA1*, BRCA2*, BRIP1*, CDC73, CDH1*,CDK4, CDKN1B, CDKN2A, CHEK2*, CTNNA1, DICER1, FANCC, FH, FLCN, GALNT12, KIF1B, LZTR1, MAX, MEN1, MET, MLH1*, MSH2*, MSH3, MSH6*, MUTYH*, NBN, NF1*, NF2, NTHL1, PALB2*, PHOX2B, PMS2*, POT1, PRKAR1A, PTCH1, PTEN*, RAD51C*, RAD51D*,RB1, RECQL, RET, SDHA, SDHAF2, SDHB, SDHC, SDHD, SMAD4, SMARCA4, SMARCB1, SMARCE1, STK11, SUFU, TMEM127, TP53*,TSC1, TSC2, VHL and XRCC2 (sequencing and deletion/duplication); EGFR, EGLN1, HOXB13, KIT, MITF, PDGFRA, POLD1 and POLE (sequencing only); EPCAM and GREM1 (deletion/duplication only).   The test report has been scanned into EPIC and is located under the Molecular Pathology section of the Results Review tab.  A portion of the result report is included below for reference.     We discussed that because current genetic testing is not perfect, it is possible there may be a gene mutation in one of these genes that current testing cannot detect, but that chance is small.  There could be another gene that has not yet been discovered, or that we have not yet tested, that is responsible for the cancer diagnoses in the family. It is also possible there is a hereditary cause for the cancer in the family that Ms. Narain did not inherit and therefore was not identified in her testing.  Therefore, it is important to remain in touch with cancer genetics in the future so that we can continue to offer Ms. Christine the most up to date genetic testing.   ADDITIONAL GENETIC TESTING: We discussed with Ms. Chesterfield that her genetic testing was fairly extensive.  If there are genes identified to increase cancer risk that can be analyzed in the future, we would be happy to discuss and coordinate this testing at that time.    CANCER SCREENING RECOMMENDATIONS: Ms. Sytsma test result is considered negative (normal).  This means that we have not identified a  hereditary cause for her  personal and family history of cancer at this time. Most cancers happen by chance and this negative test suggests that her cancer may fall into this category.    While reassuring, this does not definitively rule out a hereditary predisposition to cancer. It is still possible that there could be genetic mutations that are undetectable by current technology. There could be genetic mutations in genes that have not been tested or identified to increase cancer risk.  Therefore, it is recommended she continue to follow the cancer management and screening guidelines provided by her oncology and primary healthcare provider.   An individual's cancer risk and medical management are not determined by genetic test results alone. Overall cancer risk assessment incorporates additional factors, including personal medical history, family history, and any available genetic information that may result in a personalized plan for cancer prevention and surveillance.  RECOMMENDATIONS FOR FAMILY MEMBERS:  Relatives in this family might be at some increased risk of developing cancer, over the general population risk, simply due to the family history of cancer.  We recommended female relatives in this family have a yearly mammogram beginning at age 52, or 34 years younger than the earliest onset of cancer, an annual clinical breast exam, and perform monthly breast self-exams. Female relatives in this family should also have a gynecological exam as recommended by their primary provider.  All family members should be referred for  colonoscopy starting at age 20.   FOLLOW-UP: Lastly, we discussed with Ms. Kosier that cancer genetics is a rapidly advancing field and it is possible that new genetic tests will be appropriate for her and/or her family members in the future. We encouraged her to remain in contact with cancer genetics on an annual basis so we can update her personal and family histories and let her  know of advances in cancer genetics that may benefit this family.   Our contact number was provided. Ms. Leib questions were answered to her satisfaction, and she knows she is welcome to call us at anytime with additional questions or concerns.   Faith Rogue, MS, [facility] Genetic Counselor [email or web address] Phone: [phone]

## 2021-05-04 NOTE — Telephone Encounter (Signed)
Revealed negative genetic testing.  This normal result is reassuring and indicates that it is unlikely Ms. Latin's cancer is due to a hereditary cause.  It is unlikely that there is an increased risk of another cancer due to a mutation in one of these genes.  However, genetic testing is not perfect, and cannot definitively rule out a hereditary cause.  It will be important for her to keep in contact with genetics to learn if any additional testing may be needed in the future.

## 2021-05-31 ENCOUNTER — Other Ambulatory Visit: Payer: Self-pay | Admitting: General Surgery

## 2021-05-31 DIAGNOSIS — Z853 Personal history of malignant neoplasm of breast: Secondary | ICD-10-CM

## 2021-07-05 ENCOUNTER — Ambulatory Visit
Admission: RE | Admit: 2021-07-05 | Discharge: 2021-07-05 | Disposition: A | Discharge status: Home / Self Care | Payer: Medicare Other | Source: Ambulatory Visit | Attending: General Surgery | Admitting: General Surgery

## 2021-07-05 ENCOUNTER — Other Ambulatory Visit: Payer: Self-pay

## 2021-07-05 DIAGNOSIS — Z853 Personal history of malignant neoplasm of breast: Secondary | ICD-10-CM

## 2021-07-19 ENCOUNTER — Other Ambulatory Visit: Payer: Self-pay

## 2021-07-19 ENCOUNTER — Ambulatory Visit: Payer: Medicare Other | Admitting: Urology

## 2021-07-19 VITALS — BP 147/55 | HR 66

## 2021-07-19 DIAGNOSIS — N390 Urinary tract infection, site not specified: Secondary | ICD-10-CM

## 2021-07-19 MED ORDER — CEPHALEXIN 250 MG PO CAPS: 250.0000 mg | ORAL_CAPSULE | Freq: Every day | ORAL | 3 refills | Status: DC

## 2021-07-19 NOTE — Progress Notes (Signed)
07/19/2021 1:01 PM   Sheryl Michael 1931-06-04 [id number]  Referring provider: Idelle Crouch, MD [facility] [place],  [place] [address]  Chief Complaint  Patient presents with   Recurrent UTI    HPI: I reviewed the lengthy note.  Frequency stable.  She reports getting urinary tract infections treated by primary care.  We had a circular conversation about daily Keflex and whether or not she is to stay on it.  We decide to continue with that.   PMH: Past Medical History:  Diagnosis Date   Arthritis    Atrial fibrillation ([place])    Breast cancer ([place]) 2006   right breast ca with lumpectomy and rad tx, left breast ca 2021lumpectomy and rad tx   Colon polyp    Complication of anesthesia    questions about husband who passed in 2012    Cystocele    Depression    Dysrhythmia    Family history of breast cancer    Female bladder prolapse    GERD (gastroesophageal reflux disease)    Goiter    Hyperlipemia    Hypertension    Hypothyroidism    Osteoporosis    Personal history of radiation therapy 2006   right breast ca and left breast 2021   Pneumonia    Procidentia of uterus    Recurrent UTI    Reflux    TIA (transient ischemic attack)    Vaginal atrophy     Surgical History: Past Surgical History:  Procedure Laterality Date   APPENDECTOMY     BREAST BIOPSY Right 2006   breast cancer   BREAST BIOPSY Left 07/17/2020   [place] bx, vision marker, IMC with lobular features   BREAST LUMPECTOMY Right 2006   positive   BREAST LUMPECTOMY Left 08/03/2020   left breast invasive lobular carcinoma, negative LNs   BREAST SURGERY Right    lumpectomy   CATARACT EXTRACTION W/ INTRAOCULAR LENS  IMPLANT, BILATERAL     COLONOSCOPY     CYSTOCELE REPAIR N/A 12/21/2015   Procedure: ANTERIOR REPAIR (CYSTOCELE);  Surgeon: Brayton Mars, MD;  Location: [facility];  Service: Gynecology;  Laterality: N/A;   DILATION AND CURETTAGE OF UTERUS      ELECTROMAGNETIC NAVIGATION BROCHOSCOPY Right 12/13/2018   Procedure: ELECTROMAGNETIC NAVIGATION BRONCHOSCOPY;  Surgeon: Flora Lipps, MD;  Location: [facility];  Service: Cardiopulmonary;  Laterality: Right;   EYE SURGERY     PARTIAL MASTECTOMY WITH NEEDLE LOCALIZATION AND AXILLARY SENTINEL LYMPH NODE BX Left 08/03/2020   Procedure: PARTIAL MASTECTOMY WITH Radio Frequency tag AND AXILLARY SENTINEL LYMPH NODE BX;  Surgeon: Herbert Pun, MD;  Location: [facility];  Service: General;  Laterality: Left;   VAGINAL HYSTERECTOMY Bilateral 12/21/2015   Procedure: TVH BSO;  Surgeon: Brayton Mars, MD;  Location: [facility];  Service: Gynecology;  Laterality: Bilateral;    Home Medications:  Allergies as of 07/19/2021       Reactions   Bactrim [sulfamethoxazole-trimethoprim] Hives   Iodinated Diagnostic Agents Anaphylaxis   Codeine Nausea And Vomiting   Erythromycin Ethylsuccinate Other (See Comments)   Unknown   Phenobarbital Other (See Comments)   Feeling funny, nervous   Ciprofloxacin Rash   Penicillins Rash        Medication List        Accurate as of July 19, 2021  1:01 PM. If you have any questions, ask your nurse or doctor.          acetaminophen 325 MG  tablet Commonly known as: TYLENOL Take 650 mg by mouth every 6 (six) hours as needed (pain.).   anastrozole 1 MG tablet Commonly known as: ARIMIDEX TAKE 1 TABLET BY MOUTH EVERY DAY   apixaban 2.5 MG Tabs tablet Commonly known as: ELIQUIS Take 1 tablet (2.5 mg total) by mouth 2 (two) times daily.   Calcium Carbonate-Vitamin D 600-200 MG-UNIT Tabs Take 1 tablet by mouth daily.   cephALEXin 250 MG capsule Commonly known as: KEFLEX Take 1 capsule (250 mg total) by mouth daily.   cyanocobalamin 1000 MCG/ML injection Commonly known as: (VITAMIN B-12) Inject 1,000 mcg into the muscle every 28 (twenty-eight) days.   diltiazem 120 MG 24 hr capsule Commonly known as: CARDIZEM CD Take 1 capsule (120 mg total)  by mouth daily.   escitalopram 10 MG tablet Commonly known as: LEXAPRO Take 10 mg by mouth daily.   feeding supplement Liqd Take 237 mLs by mouth 2 (two) times daily between meals.   fosfomycin 3 g Pack Commonly known as: MONUROL Take 3 g by mouth once.   levothyroxine 50 MCG tablet Commonly known as: SYNTHROID Take 50 mcg by mouth daily before breakfast.   omeprazole 20 MG capsule Commonly known as: PRILOSEC Take 20 mg by mouth daily.   Probiotic Acidophilus Caps Take 1 capsule by mouth in the morning and at bedtime.        Allergies:  Allergies  Allergen Reactions   Bactrim [Sulfamethoxazole-Trimethoprim] Hives   Iodinated Diagnostic Agents Anaphylaxis   Codeine Nausea And Vomiting   Erythromycin Ethylsuccinate Other (See Comments)    Unknown    Phenobarbital Other (See Comments)    Feeling funny, nervous   Ciprofloxacin Rash   Penicillins Rash    Family History: Family History  Problem Relation Age of Onset   Diabetes Sister    Breast cancer Sister        late 45's   Diabetes Brother    Diabetes Sister     Social History:  reports that she has never smoked. She has never used smokeless tobacco. She reports that she does not drink alcohol and does not use drugs.  ROS:                                        Physical Exam: There were no vitals taken for this visit.  Constitutional:  Alert and oriented, No acute distress. HEENT: [place] AT, moist mucus membranes.  Trachea midline, no masses.  Laboratory Data: Lab Results  Component Value Date   WBC 5.0 04/07/2021   HGB 11.9 (L) 04/07/2021   HCT 36.6 04/07/2021   MCV 98.1 04/07/2021   PLT 230 04/07/2021    Lab Results  Component Value Date   CREATININE 0.97 04/07/2021    No results found for: PSA  No results found for: TESTOSTERONE  Lab Results  Component Value Date   HGBA1C 5.5 04/15/2013    Urinalysis    Component Value Date/Time   COLORURINE YELLOW (A)  05/04/2020 2154   APPEARANCEUR Cloudy (A) 09/21/2020 1005   LABSPEC 1.012 05/04/2020 2154   PHURINE 5.0 05/04/2020 2154   GLUCOSEU Negative 09/21/2020 [place] 05/04/2020 2154   BILIRUBINUR Negative 09/21/2020 [place] NEGATIVE 05/04/2020 2154   PROTEINUR Negative 09/21/2020 1005   PROTEINUR NEGATIVE 05/04/2020 2154   NITRITE Negative 09/21/2020 1005   NITRITE NEGATIVE 05/04/2020 2154  LEUKOCYTESUR 2+ (A) 09/21/2020 1005   LEUKOCYTESUR SMALL (A) 05/04/2020 2154    Pertinent Imaging:   Assessment & Plan: Prescription renewed and I will see in a year  There are no diagnoses linked to this encounter.  No follow-ups on file.  Reece Packer, MD  [facility] [address], [place], [place] [address] [phone]

## 2021-07-20 LAB — URINALYSIS, COMPLETE
Bilirubin, UA: NEGATIVE
Glucose, UA: NEGATIVE
Ketones, UA: NEGATIVE
Leukocytes,UA: NEGATIVE
Nitrite, UA: NEGATIVE
Protein,UA: NEGATIVE
RBC, UA: NEGATIVE
Specific Gravity, UA: 1.015 (ref 1.005–1.030)
Urobilinogen, Ur: 0.2 mg/dL (ref 0.2–1.0)
pH, UA: 6 (ref 5.0–7.5)

## 2021-07-20 LAB — MICROSCOPIC EXAMINATION
Bacteria, UA: NONE SEEN
RBC, Urine: NONE SEEN /hpf (ref 0–2)

## 2021-08-03 ENCOUNTER — Other Ambulatory Visit: Payer: Self-pay

## 2021-08-03 DIAGNOSIS — C50512 Malignant neoplasm of lower-outer quadrant of left female breast: Secondary | ICD-10-CM

## 2021-08-03 DIAGNOSIS — Z170 Estrogen receptor positive status [ER+]: Secondary | ICD-10-CM

## 2021-08-04 ENCOUNTER — Inpatient Hospital Stay (HOSPITAL_BASED_OUTPATIENT_CLINIC_OR_DEPARTMENT_OTHER): Payer: Medicare Other | Admitting: Internal Medicine

## 2021-08-04 ENCOUNTER — Inpatient Hospital Stay: Payer: Medicare Other

## 2021-08-04 ENCOUNTER — Inpatient Hospital Stay: Payer: Medicare Other | Attending: Internal Medicine

## 2021-08-04 ENCOUNTER — Other Ambulatory Visit: Payer: Self-pay

## 2021-08-04 DIAGNOSIS — Z7901 Long term (current) use of anticoagulants: Secondary | ICD-10-CM | POA: Diagnosis not present

## 2021-08-04 DIAGNOSIS — I4891 Unspecified atrial fibrillation: Secondary | ICD-10-CM | POA: Insufficient documentation

## 2021-08-04 DIAGNOSIS — Z79899 Other long term (current) drug therapy: Secondary | ICD-10-CM | POA: Insufficient documentation

## 2021-08-04 DIAGNOSIS — C50512 Malignant neoplasm of lower-outer quadrant of left female breast: Secondary | ICD-10-CM

## 2021-08-04 DIAGNOSIS — M810 Age-related osteoporosis without current pathological fracture: Secondary | ICD-10-CM | POA: Diagnosis not present

## 2021-08-04 DIAGNOSIS — Z86010 Personal history of colonic polyps: Secondary | ICD-10-CM | POA: Insufficient documentation

## 2021-08-04 DIAGNOSIS — Z79811 Long term (current) use of aromatase inhibitors: Secondary | ICD-10-CM | POA: Insufficient documentation

## 2021-08-04 DIAGNOSIS — Z170 Estrogen receptor positive status [ER+]: Secondary | ICD-10-CM | POA: Diagnosis not present

## 2021-08-04 DIAGNOSIS — Z780 Asymptomatic menopausal state: Secondary | ICD-10-CM | POA: Insufficient documentation

## 2021-08-04 DIAGNOSIS — Z803 Family history of malignant neoplasm of breast: Secondary | ICD-10-CM | POA: Insufficient documentation

## 2021-08-04 DIAGNOSIS — Z87440 Personal history of urinary (tract) infections: Secondary | ICD-10-CM | POA: Insufficient documentation

## 2021-08-04 LAB — CBC WITH DIFFERENTIAL/PLATELET
Abs Immature Granulocytes: 0.01 K/uL (ref 0.00–0.07)
Basophils Absolute: 0 K/uL (ref 0.0–0.1)
Basophils Relative: 1 %
Eosinophils Absolute: 0.1 K/uL (ref 0.0–0.5)
Eosinophils Relative: 3 %
HCT: 37.9 % (ref 36.0–46.0)
Hemoglobin: 12.5 g/dL (ref 12.0–15.0)
Immature Granulocytes: 0 %
Lymphocytes Relative: 31 %
Lymphs Abs: 1.3 K/uL (ref 0.7–4.0)
MCH: 31.7 pg (ref 26.0–34.0)
MCHC: 33 g/dL (ref 30.0–36.0)
MCV: 96.2 fL (ref 80.0–100.0)
Monocytes Absolute: 0.5 K/uL (ref 0.1–1.0)
Monocytes Relative: 11 %
Neutro Abs: 2.3 K/uL (ref 1.7–7.7)
Neutrophils Relative %: 54 %
Platelets: 182 K/uL (ref 150–400)
RBC: 3.94 MIL/uL (ref 3.87–5.11)
RDW: 13.9 % (ref 11.5–15.5)
WBC: 4.3 K/uL (ref 4.0–10.5)
nRBC: 0 % (ref 0.0–0.2)

## 2021-08-04 LAB — BASIC METABOLIC PANEL
Anion gap: 5 (ref 5–15)
BUN: 18 mg/dL (ref 8–23)
CO2: 28 mmol/L (ref 22–32)
Calcium: 8.9 mg/dL (ref 8.9–10.3)
Chloride: 104 mmol/L (ref 98–111)
Creatinine, Ser: 1.04 mg/dL — ABNORMAL HIGH (ref 0.44–1.00)
GFR, Estimated: 52 mL/min — ABNORMAL LOW (ref >60)
Glucose, Bld: 96 mg/dL (ref 70–99)
Potassium: 4.2 mmol/L (ref 3.5–5.1)
Sodium: 137 mmol/L (ref 135–145)

## 2021-08-04 NOTE — Progress Notes (Signed)
Survivorship Care Plan visit completed.  Treatment summary reviewed and given to patient.  ASCO answers booklet reviewed and given to patient.  CARE program and Cancer Transitions discussed with patient along with other resources cancer center offers to patients and caregivers.  Patient verbalized understanding.    

## 2021-08-04 NOTE — Progress Notes (Signed)
[facility] NOTE  Patient Care Team: Idelle Crouch, MD as PCP - General (Internal Medicine) Cammie Sickle, MD as Consulting Physician (Internal Medicine) Herbert Pun, MD as Consulting Physician (General Surgery) Noreene Filbert, MD as Radiation Oncologist (Radiation Oncology)  CHIEF COMPLAINTS/PURPOSE OF CONSULTATION: Breast cancer  #  Oncology History Overview Note  #Right breast cancer [2006; Dr.Choksi]-s/p lumpectomy followed by radiation; ? Endocrine therapy.   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  # JAN 25th, 2022-start anastrozole  #2016 bone density osteoporosis  # A.fib- on eliuiqs [Dr.callwood]; frequent UTIs [[facility]rology[facility] ]; COVID in dec 2021.   # SURVIVORSHIP:   # GENETICS:   DIAGNOSIS: left breast cancer  STAGE:    I     ;  GOALS: cure  CURRENT/MOST RECENT THERAPY : RT    Carcinoma of lower-outer quadrant of left breast in female, estrogen receptor positive ([place])  07/23/2020 Initial Diagnosis   Carcinoma of lower-outer quadrant of left breast in female, estrogen receptor negative ([place])    Genetic Testing   Negative genetic testing. No pathogenic variants identified on the [facility] Multi-Cancer Panel +RNA. The report date is 04/24/2021.   The Multi-Cancer Panel + RNA offered by [facility] includes sequencing and/or deletion duplication testing of the following 84 genes: AIP, ALK, APC, ATM, AXIN2,BAP1,  BARD1, BLM, BMPR1A, BRCA1, BRCA2, BRIP1, CASR, CDC73, CDH1, CDK4, CDKN1B, CDKN1C, CDKN2A (p14ARF), CDKN2A (p16INK4a), CEBPA, CHEK2, CTNNA1, DICER1, DIS3L2, EGFR (c.2369C>T, p.Thr790Met variant only), EPCAM (Deletion/duplication testing only), FH, FLCN, GATA2, GPC3, GREM1 (Promoter region deletion/duplication testing only), HOXB13 (c.251G>A, p.Gly84Glu), HRAS, KIT, MAX, MEN1, MET,  MITF (c.952G>A, p.Glu318Lys variant only), MLH1, MSH2, MSH3, MSH6, MUTYH, NBN, NF1, NF2, NTHL1, PALB2, PDGFRA, PHOX2B, PMS2, POLD1, POLE, POT1, PRKAR1A, PTCH1, PTEN, RAD50, RAD51C, RAD51D, RB1, RECQL4, RET, RUNX1, SDHAF2, SDHA (sequence changes only), SDHB, SDHC, SDHD, SMAD4, SMARCA4, SMARCB1, SMARCE1, STK11, SUFU, TERC, TERT, TMEM127, TP53, TSC1, TSC2, VHL, WRN and WT1.      HISTORY OF PRESENTING ILLNESS: Patient is alone.  Walks independently. Melton Alar 87 y.o.  female patient with left-sided breast cancer status post surgery is here for follow-up.  Denies any recent UTI.  Follows up with urology.  She continues to take anastrozole.  Denies any joint pains nausea vomiting headaches.  Denies any worsening hot flashes.   Review of Systems  Constitutional:  Negative for chills, diaphoresis, fever, malaise/fatigue and weight loss.  HENT:  Negative for nosebleeds and sore throat.   Eyes:  Negative for double vision.  Respiratory:  Negative for cough, hemoptysis, sputum production, shortness of breath and wheezing.   Cardiovascular:  Negative for chest pain, palpitations, orthopnea and leg swelling.  Gastrointestinal:  Negative for abdominal pain, blood in stool, constipation, diarrhea, heartburn, melena, nausea and vomiting.  Genitourinary:  Negative for dysuria, frequency and urgency.  Musculoskeletal:  Positive for back pain and joint pain.  Skin: Negative.  Negative for itching and rash.  Neurological:  Negative for dizziness, tingling, focal weakness, weakness and headaches.  Endo/Heme/Allergies:  Does not bruise/bleed easily.  Psychiatric/Behavioral:  Negative for depression. The patient is not nervous/anxious and does not have insomnia.     MEDICAL HISTORY:  Past Medical History:  Diagnosis Date   Arthritis    Atrial fibrillation ([place])    Breast cancer ([place]) 2006   right breast ca with lumpectomy and rad tx, left breast ca 2021lumpectomy and rad  tx   Colon polyp     Complication of anesthesia    questions about husband who passed in 2012    Cystocele    Depression    Dysrhythmia    Family history of breast cancer    Female bladder prolapse    GERD (gastroesophageal reflux disease)    Goiter    Hyperlipemia    Hypertension    Hypothyroidism    Osteoporosis    Personal history of radiation therapy 2006   right breast ca and left breast 2021   Pneumonia    Procidentia of uterus    Recurrent UTI    Reflux    TIA (transient ischemic attack)    Vaginal atrophy     SURGICAL HISTORY: Past Surgical History:  Procedure Laterality Date   APPENDECTOMY     BREAST BIOPSY Right 2006   breast cancer   BREAST BIOPSY Left 07/17/2020   [place] bx, vision marker, IMC with lobular features   BREAST LUMPECTOMY Right 2006   positive   BREAST LUMPECTOMY Left 08/03/2020   left breast invasive lobular carcinoma, negative LNs   BREAST SURGERY Right    lumpectomy   CATARACT EXTRACTION W/ INTRAOCULAR LENS  IMPLANT, BILATERAL     COLONOSCOPY     CYSTOCELE REPAIR N/A 12/21/2015   Procedure: ANTERIOR REPAIR (CYSTOCELE);  Surgeon: Brayton Mars, MD;  Location: [facility];  Service: Gynecology;  Laterality: N/A;   DILATION AND CURETTAGE OF UTERUS     ELECTROMAGNETIC NAVIGATION BROCHOSCOPY Right 12/13/2018   Procedure: ELECTROMAGNETIC NAVIGATION BRONCHOSCOPY;  Surgeon: Flora Lipps, MD;  Location: [facility];  Service: Cardiopulmonary;  Laterality: Right;   EYE SURGERY     PARTIAL MASTECTOMY WITH NEEDLE LOCALIZATION AND AXILLARY SENTINEL LYMPH NODE BX Left 08/03/2020   Procedure: PARTIAL MASTECTOMY WITH Radio Frequency tag AND AXILLARY SENTINEL LYMPH NODE BX;  Surgeon: Herbert Pun, MD;  Location: [facility];  Service: General;  Laterality: Left;   VAGINAL HYSTERECTOMY Bilateral 12/21/2015   Procedure: TVH BSO;  Surgeon: Brayton Mars, MD;  Location: [facility];  Service: Gynecology;  Laterality: Bilateral;    SOCIAL HISTORY: Social History    Socioeconomic History   Marital status: Widowed    Spouse name: Not on file   Number of children: Not on file   Years of education: Not on file   Highest education level: Not on file  Occupational History   Not on file  Tobacco Use   Smoking status: Never   Smokeless tobacco: Never  Vaping Use   Vaping Use: Never used  Substance and Sexual Activity   Alcohol use: No   Drug use: No   Sexual activity: Never    Birth control/protection: Post-menopausal  Other Topics Concern   Not on file  Social History Narrative   Not on file   Social Determinants of Health   Financial Resource Strain: Not on file  Food Insecurity: Not on file  Transportation Needs: Not on file  Physical Activity: Not on file  Stress: Not on file  Social Connections: Not on file  Intimate Partner Violence: Not on file    FAMILY HISTORY: Family History  Problem Relation Age of Onset   Diabetes Sister    Breast cancer Sister        late 61's   Diabetes Brother    Diabetes Sister     ALLERGIES:  is allergic to bactrim [sulfamethoxazole-trimethoprim], iodinated diagnostic agents, codeine, erythromycin ethylsuccinate, phenobarbital, ciprofloxacin, and penicillins.  MEDICATIONS:  Current Outpatient  Medications  Medication Sig Dispense Refill   acetaminophen (TYLENOL) 325 MG tablet Take 650 mg by mouth every 6 (six) hours as needed (pain.).      anastrozole (ARIMIDEX) 1 MG tablet TAKE 1 TABLET BY MOUTH EVERY DAY 90 tablet 2   apixaban (ELIQUIS) 2.5 MG TABS tablet Take 1 tablet (2.5 mg total) by mouth 2 (two) times daily. 60 tablet 0   Calcium Carbonate-Vitamin D 600-200 MG-UNIT TABS Take 1 tablet by mouth daily.      cephALEXin (KEFLEX) 250 MG capsule Take 1 capsule (250 mg total) by mouth daily. 90 capsule 3   cyanocobalamin (,VITAMIN B-12,) 1000 MCG/ML injection Inject 1,000 mcg into the muscle every 28 (twenty-eight) days.     diltiazem (CARDIZEM CD) 120 MG 24 hr capsule Take 1 capsule (120 mg  total) by mouth daily. 30 capsule 0   escitalopram (LEXAPRO) 10 MG tablet Take 10 mg by mouth daily.      Lactobacillus (PROBIOTIC ACIDOPHILUS) CAPS Take 1 capsule by mouth in the morning and at bedtime.     levothyroxine (SYNTHROID, LEVOTHROID) 50 MCG tablet Take 50 mcg by mouth daily before breakfast.     omeprazole (PRILOSEC) 20 MG capsule Take 20 mg by mouth daily.     feeding supplement, ENSURE ENLIVE, (ENSURE ENLIVE) LIQD Take 237 mLs by mouth 2 (two) times daily between meals. (Patient not taking: Reported on 08/04/2021) 237 mL 12   No current facility-administered medications for this visit.      Marland Kitchen  PHYSICAL EXAMINATION: ECOG PERFORMANCE STATUS: 0 - Asymptomatic  Vitals:   08/04/21 1303  BP: (!) 125/47  Pulse: (!) 58  Resp: 18  Temp: 98.5 F (36.9 C)  SpO2: 98%   Filed Weights   08/04/21 1301  Weight: 123 lb (55.8 kg)    Physical Exam HENT:     Head: Normocephalic and atraumatic.     Mouth/Throat:     Pharynx: No oropharyngeal exudate.  Eyes:     Pupils: Pupils are equal, round, and reactive to light.  Cardiovascular:     Rate and Rhythm: Normal rate and regular rhythm.  Pulmonary:     Effort: Pulmonary effort is normal. No respiratory distress.     Breath sounds: No wheezing.  Abdominal:     General: Bowel sounds are normal. There is no distension.     Palpations: Abdomen is soft. There is no mass.     Tenderness: There is no abdominal tenderness. There is no guarding or rebound.  Musculoskeletal:        General: No tenderness. Normal range of motion.     Cervical back: Normal range of motion and neck supple.  Skin:    General: Skin is warm.  Neurological:     Mental Status: She is alert and oriented to person, place, and time.  Psychiatric:        Mood and Affect: Affect normal.     LABORATORY DATA:  I have reviewed the data as listed Lab Results  Component Value Date   WBC 4.3 08/04/2021   HGB 12.5 08/04/2021   HCT 37.9 08/04/2021   MCV 96.2  08/04/2021   PLT 182 08/04/2021   Recent Labs    04/07/21 1242 08/04/21 1235  NA 139 137  K 4.4 4.2  CL 106 104  CO2 26 28  GLUCOSE 137* 96  BUN 13 18  CREATININE 0.97 1.04*  CALCIUM 8.9 8.9  GFRNONAA 57* 52*    RADIOGRAPHIC STUDIES: I have personally  reviewed the radiological images as listed and agreed with the findings in the report. No results found.  ASSESSMENT & PLAN:   Carcinoma of lower-outer quadrant of left breast in female, estrogen receptor positive ([place]) # LEFT BREAST INVASIVE MAMMARY CA with  LOBULAR CANCER-ER-POSITIVE; PR- NEG; Her-2- POSITIVE.  Not a candidate for adjuvant chemotherapy.  Currently on adjuvant anastrozole.  Stable.  Tolerating current medical therapy.  Continue anastrozole.  # OSTEOPOROSIS: 2022-BMD measured at AP Spine L1-L3 is 0.795 g/cm2 with a T-score of -3.2. Marland KitchenDiscussed the potential risk factors for osteoporosis- age/gender/postmenopausal status/use of anti-estrogen treatments.  calcium and vitamin D supplementation/ and also use of bisphosphonates.  Discussed use of Reclast on a yearly basis.  Potential benefits and/side effects  Including but not limited to Osteonecrosis of jaw/ hypocalcemia.  Patient reluctant anxious wants to hold off Reclast today.  # Left lingular infltrate- likley radiation induced.  Currently symptoms resolved s/p antibiotic/prednisone.  Right middle lobe infiltrative lesion-significantly improved compared to 2020.  Patient had a bronc with Dr. Mortimer Fries in February 2020 [Ln -NEG for malignancy].  Reviewed imaging independently/and with family.  Given her age frailty I think is reasonable to hold off any further work-up with pulmonary.  #A. Fib-on Eliquis/amiodarone-stable  #History of UTIs- on prophylaxis Kelfex/doxycycline-[urology.]  #Genetics: Patient is interested in genetic blood draw discussed with genetic counselor.  We will draw blood today.  # DISPOSITION: # HOLD reclast # follow up in 4 months-; MD; labs-  cbc/bmp; Possible reclast-Dr.B  All questions were answered. The patient/family knows to call the clinic with any problems, questions or concerns.    Cammie Sickle, MD 08/04/2021 1:51 PM

## 2021-08-04 NOTE — Assessment & Plan Note (Addendum)
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  Not a candidate for adjuvant chemotherapy.  Currently on adjuvant anastrozole.   Tolerating current medical therapy.  Continue anastrozole. Stable.  # OSTEOPOROSIS: 2022-BMD measured at AP Spine L1-L3 is 0.795 g/cm2 with a T-score of -3.2. Discussed the potential risk factors for osteoporosis- age/gender/postmenopausal status/use of anti-estrogen treatments.  calcium and vitamin D supplementation/ and also use of bisphosphonates.  Patient concerned about potential side effects-of lower extremity pain given prior use with Fosamax.  She is still reluctant with Reclast; will hold off recast/patient preference.  Reassess in 6 months.  # Left lingular infltrate- likley radiation induced.  Currently symptoms resolved s/p antibiotic/prednisone.  Right middle lobe infiltrative lesion-significantly improved compared to 2020.  Patient had a bronc with Dr. Mortimer Fries in February 2020 [Ln -NEG for malignancy].  Hold off any further work-up given her age.  #A. Fib-on Eliquis/amiodarone-stable  #History of UTIs- on prophylaxis Kelfex/doxycycline-[urology.]  #Genetics: Patient is interested in genetic blood draw discussed with genetic counselor-negative for any deleterious mutations.  # DISPOSITION: # HOLD reclast # follow up in 6 months-; MD; labs- cbc/bmp; Possible reclast-Dr.B

## 2021-11-15 ENCOUNTER — Emergency Department
Admission: EM | Admit: 2021-11-15 | Discharge: 2021-11-15 | Disposition: A | Discharge status: Home / Self Care | Payer: Medicare Other | Attending: Emergency Medicine | Admitting: Emergency Medicine

## 2021-11-15 ENCOUNTER — Other Ambulatory Visit: Payer: Self-pay

## 2021-11-15 ENCOUNTER — Emergency Department: Payer: Medicare Other

## 2021-11-15 ENCOUNTER — Encounter: Payer: Self-pay | Admitting: Emergency Medicine

## 2021-11-15 DIAGNOSIS — Y9289 Other specified places as the place of occurrence of the external cause: Secondary | ICD-10-CM | POA: Insufficient documentation

## 2021-11-15 DIAGNOSIS — R519 Headache, unspecified: Secondary | ICD-10-CM | POA: Insufficient documentation

## 2021-11-15 DIAGNOSIS — W01198A Fall on same level from slipping, tripping and stumbling with subsequent striking against other object, initial encounter: Secondary | ICD-10-CM | POA: Insufficient documentation

## 2021-11-15 DIAGNOSIS — S0512XA Contusion of eyeball and orbital tissues, left eye, initial encounter: Secondary | ICD-10-CM | POA: Insufficient documentation

## 2021-11-15 DIAGNOSIS — Y9301 Activity, walking, marching and hiking: Secondary | ICD-10-CM | POA: Insufficient documentation

## 2021-11-15 DIAGNOSIS — S0592XA Unspecified injury of left eye and orbit, initial encounter: Secondary | ICD-10-CM | POA: Diagnosis present

## 2021-11-15 DIAGNOSIS — Z853 Personal history of malignant neoplasm of breast: Secondary | ICD-10-CM | POA: Insufficient documentation

## 2021-11-15 DIAGNOSIS — R079 Chest pain, unspecified: Secondary | ICD-10-CM | POA: Diagnosis not present

## 2021-11-15 DIAGNOSIS — Z7901 Long term (current) use of anticoagulants: Secondary | ICD-10-CM | POA: Diagnosis not present

## 2021-11-15 DIAGNOSIS — I4891 Unspecified atrial fibrillation: Secondary | ICD-10-CM | POA: Diagnosis not present

## 2021-11-15 DIAGNOSIS — W19XXXA Unspecified fall, initial encounter: Secondary | ICD-10-CM

## 2021-11-15 NOTE — ED Triage Notes (Signed)
Pt via POV from home. Pt had a mechanical fall today. Pt stumbled and hit her head. Pt does take Eliquis. Denies pain but does have hematoma to the L eyebrow. Denies lightheadedness. Denies NV. Pt is A&Ox4 and NAD

## 2021-11-15 NOTE — ED Provider Notes (Signed)
[facility] Provider Note    Event Date/Time   First MD Initiated Contact with Patient 11/15/21 1626     (approximate)   History   Chief Complaint Fall   HPI Sheryl Michael is a 88 y.o. female, history of atrial fibrillation, GERD, hyperlipidemia, breast cancer, presents to the emergency department for evaluation of injury sustained from a fall.  Patient states that she experienced a mechanical fall today while walking outside and fell on the cement ground.  She states that she hit her head.  Denies LOC or nausea/vomiting afterwards.  Denies lightheadedness, dizziness, or vertigo.  She states that she currently has a mild headache and feels some mild pain along the left side of her chest when she presses on it.  She is currently on Eliquis.  Denies fever/chills, neck pain, back pain, hip pain, numbness/tingling in upper or lower extremities, blurred vision, or hearing changes.  History Limitations: No limitations.      Physical Exam  Triage Vital Signs: ED Triage Vitals  Enc Vitals Group     BP 11/15/21 1509 138/61     Pulse Rate 11/15/21 1509 65     Resp 11/15/21 1509 18     Temp 11/15/21 1509 98.5 F (36.9 C)     Temp Source 11/15/21 1509 Oral     SpO2 11/15/21 1509 95 %     Weight 11/15/21 1507 120 lb (54.4 kg)     Height 11/15/21 1507 5\' 1"  (1.549 m)     Head Circumference --      Peak Flow --      Pain Score 11/15/21 1507 0     Pain Loc --      Pain Edu? --      Excl. in [place]? --     Most recent vital signs: Vitals:   11/15/21 1509  BP: 138/61  Pulse: 65  Resp: 18  Temp: 98.5 F (36.9 C)  SpO2: 95%    General: Awake, NAD.  CV: Good peripheral perfusion.  Resp: Normal effort.  Abd: Soft, non-tender. No distention.  Neuro: At baseline.  Cranial nerves II through XII intact.  Normal strength in upper and lower extremities. Other: Contusion appreciated around the left orbit.  No cranial or maxillofacial tenderness.  Mild tenderness  appreciated on the left side of the chest.  Patient is able to ambulate well without ataxia.  Physical Exam    ED Results / Procedures / Treatments  Labs (all labs ordered are listed, but only abnormal results are displayed) Labs Reviewed - No data to display   EKG Not applicable.   RADIOLOGY  ED Provider Interpretation: I personally reviewed and interpreted these images.  Negative chest x-ray.  Head CT shows no acute intracranial abnormality.  Neck CT shows no acute fractures or dislocations.  Maxillofacial CT unremarkable.  DG Chest 2 View  Result Date: 11/15/2021 CLINICAL DATA:  chest pain EXAM: CHEST - 2 VIEW COMPARISON:  Chest x-ray 05/04/2020 FINDINGS: The heart and mediastinal contours are unchanged. Aortic calcification. Biapical pleural/pulmonary scarring. No focal consolidation. No pulmonary edema. No pleural effusion. No pneumothorax. No acute osseous abnormality. Old healed right and left rib fractures. Rounded peripherally calcified lesion within the right breast again noted-likely sequelae of trauma. IMPRESSION: No active cardiopulmonary disease. Electronically Signed   By: Iven Finn M.D.   On: 11/15/2021 18:01   CT HEAD WO CONTRAST (5MM)  Result Date: 11/15/2021 CLINICAL DATA:  Head trauma, moderate/severe. Facial trauma, blunt. Neck  trauma. Additional history provided: Mechanical fall (hitting head) hematoma to left eyebrow. EXAM: CT HEAD WITHOUT CONTRAST CT MAXILLOFACIAL WITHOUT CONTRAST CT CERVICAL SPINE WITHOUT CONTRAST TECHNIQUE: Multidetector CT imaging of the head, cervical spine, and maxillofacial structures were performed using the standard protocol without intravenous contrast. Multiplanar CT image reconstructions of the cervical spine and maxillofacial structures were also generated. RADIATION DOSE REDUCTION: This exam was performed according to the departmental dose-optimization program which includes automated exposure control, adjustment of the mA and/or  kV according to patient size and/or use of iterative reconstruction technique. COMPARISON:  Brain MRI 04/04/2020. Head CT 02/07/2020. FINDINGS: CT HEAD FINDING: Brain: Mild generalized parenchymal atrophy, not unexpected for age. Small chronic insult within the right basal ganglia/anterior limb of right internal capsule, more fully characterized on the prior brain MRI of 04/04/2020. 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 There is no acute intracranial hemorrhage. No demarcated cortical infarct. No extra-axial fluid collection. No evidence of an intracranial mass. No midline shift. Vascular: No hyperdense vessel.  Atherosclerotic calcifications. Skull: Normal. Negative for fracture or focal lesion. Other: Small left mastoid effusion. CT MAXILLOFACIAL FINDINGS Osseous: No acute maxillofacial fracture is identified. Orbits: Left periorbital and maxillofacial soft tissue swelling/hematoma. No acute abnormality within the orbits. Sinuses: Mild mucosal thickening within the bilateral maxillary sinuses. Soft tissues: Left periorbital and maxillofacial soft tissue swelling/hematoma. CT CERVICAL SPINE FINDINGS Alignment: Straightening of the expected cervical lordosis. 2 mm C4-C5 grade 1 anterolisthesis. Skull base and vertebrae: The basion-dental and atlanto-dental intervals are maintained.No evidence of acute fracture to the cervical spine. Subcentimeter well-circumscribed lucent focus within the C5 vertebral body, favored benign/degenerative. Facet joint ankylosis on the right at C4-C5. Soft tissues and spinal canal: No prevertebral fluid or swelling. No visible canal hematoma. Disc levels: Cervical spondylosis with multilevel disc space narrowing, disc bulges/central disc protrusions, posterior disc osteophytes, endplate spurring, uncovertebral hypertrophy and facet arthrosis. Disc space narrowing is greatest at C5-C6 and C6-C7 (moderately advanced at these levels). No appreciable high-grade  spinal canal stenosis. Multilevel bony neural foraminal narrowing. Multilevel ventral osteophytes, most prominent at C5-C6, C7-T1 and T1-T2. Upper chest: Consolidation within the imaged lung apices. No visible pneumothorax. Biapical pleuroparenchymal scarring. IMPRESSION: CT head: 1. No evidence of acute intracranial abnormality. 2. Mild generalized parenchymal atrophy. 3. Small chronic insult within the right basal ganglia/anterior limb of right internal capsule, more fully characterized on the prior brain MRI of 04/04/2020. Please refer to this prior examination for further description and for differential considerations. 4. 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 5. Small left mastoid effusion. CT maxillofacial: 1. No evidence of acute maxillofacial fracture. 2. Left periorbital and maxillofacial soft tissue swelling/hematoma. 3. Mild mucosal thickening within the bilateral maxillary sinuses. CT cervical spine: 1. No evidence of acute fracture to the cervical spine. 2. Straightening of the expected cervical lordosis. 3. 2 mm C4-C5 grade 1 anterolisthesis. 4. Cervical spondylosis, as described. 5. Facet joint ankylosis on the right at C4-C5. Electronically Signed   By: Kellie Simmering D.O.   On: 11/15/2021 16:06   CT Cervical Spine Wo Contrast  Result Date: 11/15/2021 CLINICAL DATA:  Head trauma, moderate/severe. Facial trauma, blunt. Neck trauma. Additional history provided: Mechanical fall (hitting head) hematoma to left eyebrow. EXAM: CT HEAD WITHOUT CONTRAST CT MAXILLOFACIAL WITHOUT CONTRAST CT CERVICAL SPINE WITHOUT CONTRAST TECHNIQUE: Multidetector CT imaging of the head, cervical spine, and maxillofacial structures were performed using the standard protocol without intravenous contrast. Multiplanar CT image reconstructions of the cervical spine  and maxillofacial structures were also generated. RADIATION DOSE REDUCTION: This exam was performed according to the  departmental dose-optimization program which includes automated exposure control, adjustment of the mA and/or kV according to patient size and/or use of iterative reconstruction technique. COMPARISON:  Brain MRI 04/04/2020. Head CT 02/07/2020. FINDINGS: CT HEAD FINDING: Brain: Mild generalized parenchymal atrophy, not unexpected for age. Small chronic insult within the right basal ganglia/anterior limb of right internal capsule, more fully characterized on the prior brain MRI of 04/04/2020. 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 There is no acute intracranial hemorrhage. No demarcated cortical infarct. No extra-axial fluid collection. No evidence of an intracranial mass. No midline shift. Vascular: No hyperdense vessel.  Atherosclerotic calcifications. Skull: Normal. Negative for fracture or focal lesion. Other: Small left mastoid effusion. CT MAXILLOFACIAL FINDINGS Osseous: No acute maxillofacial fracture is identified. Orbits: Left periorbital and maxillofacial soft tissue swelling/hematoma. No acute abnormality within the orbits. Sinuses: Mild mucosal thickening within the bilateral maxillary sinuses. Soft tissues: Left periorbital and maxillofacial soft tissue swelling/hematoma. CT CERVICAL SPINE FINDINGS Alignment: Straightening of the expected cervical lordosis. 2 mm C4-C5 grade 1 anterolisthesis. Skull base and vertebrae: The basion-dental and atlanto-dental intervals are maintained.No evidence of acute fracture to the cervical spine. Subcentimeter well-circumscribed lucent focus within the C5 vertebral body, favored benign/degenerative. Facet joint ankylosis on the right at C4-C5. Soft tissues and spinal canal: No prevertebral fluid or swelling. No visible canal hematoma. Disc levels: Cervical spondylosis with multilevel disc space narrowing, disc bulges/central disc protrusions, posterior disc osteophytes, endplate spurring, uncovertebral hypertrophy and facet arthrosis. Disc  space narrowing is greatest at C5-C6 and C6-C7 (moderately advanced at these levels). No appreciable high-grade spinal canal stenosis. Multilevel bony neural foraminal narrowing. Multilevel ventral osteophytes, most prominent at C5-C6, C7-T1 and T1-T2. Upper chest: Consolidation within the imaged lung apices. No visible pneumothorax. Biapical pleuroparenchymal scarring. IMPRESSION: CT head: 1. No evidence of acute intracranial abnormality. 2. Mild generalized parenchymal atrophy. 3. Small chronic insult within the right basal ganglia/anterior limb of right internal capsule, more fully characterized on the prior brain MRI of 04/04/2020. Please refer to this prior examination for further description and for differential considerations. 4. 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 5. Small left mastoid effusion. CT maxillofacial: 1. No evidence of acute maxillofacial fracture. 2. Left periorbital and maxillofacial soft tissue swelling/hematoma. 3. Mild mucosal thickening within the bilateral maxillary sinuses. CT cervical spine: 1. No evidence of acute fracture to the cervical spine. 2. Straightening of the expected cervical lordosis. 3. 2 mm C4-C5 grade 1 anterolisthesis. 4. Cervical spondylosis, as described. 5. Facet joint ankylosis on the right at C4-C5. Electronically Signed   By: Kellie Simmering D.O.   On: 11/15/2021 16:06   CT Maxillofacial Wo Contrast  Result Date: 11/15/2021 CLINICAL DATA:  Head trauma, moderate/severe. Facial trauma, blunt. Neck trauma. Additional history provided: Mechanical fall (hitting head) hematoma to left eyebrow. EXAM: CT HEAD WITHOUT CONTRAST CT MAXILLOFACIAL WITHOUT CONTRAST CT CERVICAL SPINE WITHOUT CONTRAST TECHNIQUE: Multidetector CT imaging of the head, cervical spine, and maxillofacial structures were performed using the standard protocol without intravenous contrast. Multiplanar CT image reconstructions of the cervical spine and  maxillofacial structures were also generated. RADIATION DOSE REDUCTION: This exam was performed according to the departmental dose-optimization program which includes automated exposure control, adjustment of the mA and/or kV according to patient size and/or use of iterative reconstruction technique. COMPARISON:  Brain MRI 04/04/2020. Head CT 02/07/2020. FINDINGS: CT HEAD FINDING: Brain: Mild generalized  parenchymal atrophy, not unexpected for age. Small chronic insult within the right basal ganglia/anterior limb of right internal capsule, more fully characterized on the prior brain MRI of 04/04/2020. 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 There is no acute intracranial hemorrhage. No demarcated cortical infarct. No extra-axial fluid collection. No evidence of an intracranial mass. No midline shift. Vascular: No hyperdense vessel.  Atherosclerotic calcifications. Skull: Normal. Negative for fracture or focal lesion. Other: Small left mastoid effusion. CT MAXILLOFACIAL FINDINGS Osseous: No acute maxillofacial fracture is identified. Orbits: Left periorbital and maxillofacial soft tissue swelling/hematoma. No acute abnormality within the orbits. Sinuses: Mild mucosal thickening within the bilateral maxillary sinuses. Soft tissues: Left periorbital and maxillofacial soft tissue swelling/hematoma. CT CERVICAL SPINE FINDINGS Alignment: Straightening of the expected cervical lordosis. 2 mm C4-C5 grade 1 anterolisthesis. Skull base and vertebrae: The basion-dental and atlanto-dental intervals are maintained.No evidence of acute fracture to the cervical spine. Subcentimeter well-circumscribed lucent focus within the C5 vertebral body, favored benign/degenerative. Facet joint ankylosis on the right at C4-C5. Soft tissues and spinal canal: No prevertebral fluid or swelling. No visible canal hematoma. Disc levels: Cervical spondylosis with multilevel disc space narrowing, disc bulges/central disc  protrusions, posterior disc osteophytes, endplate spurring, uncovertebral hypertrophy and facet arthrosis. Disc space narrowing is greatest at C5-C6 and C6-C7 (moderately advanced at these levels). No appreciable high-grade spinal canal stenosis. Multilevel bony neural foraminal narrowing. Multilevel ventral osteophytes, most prominent at C5-C6, C7-T1 and T1-T2. Upper chest: Consolidation within the imaged lung apices. No visible pneumothorax. Biapical pleuroparenchymal scarring. IMPRESSION: CT head: 1. No evidence of acute intracranial abnormality. 2. Mild generalized parenchymal atrophy. 3. Small chronic insult within the right basal ganglia/anterior limb of right internal capsule, more fully characterized on the prior brain MRI of 04/04/2020. Please refer to this prior examination for further description and for differential considerations. 4. 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 5. Small left mastoid effusion. CT maxillofacial: 1. No evidence of acute maxillofacial fracture. 2. Left periorbital and maxillofacial soft tissue swelling/hematoma. 3. Mild mucosal thickening within the bilateral maxillary sinuses. CT cervical spine: 1. No evidence of acute fracture to the cervical spine. 2. Straightening of the expected cervical lordosis. 3. 2 mm C4-C5 grade 1 anterolisthesis. 4. Cervical spondylosis, as described. 5. Facet joint ankylosis on the right at C4-C5. Electronically Signed   By: Kellie Simmering D.O.   On: 11/15/2021 16:06    PROCEDURES:  Critical Care performed: None.  Procedures    MEDICATIONS ORDERED IN ED: Medications - No data to display   IMPRESSION / MDM / [place] / ED COURSE  I reviewed the triage vital signs and the nursing notes.                              Sheryl Michael is a 88 y.o. female, history of atrial fibrillation, GERD, hyperlipidemia, breast cancer, presents to the emergency department for evaluation of injury sustained  from a fall.  Patient states that she experienced a mechanical fall today while walking outside and fell on the cement ground.  She states that she hit her head.  Denies LOC or nausea/vomiting afterwards.  Denies lightheadedness, dizziness, or vertigo.  She states that she currently has a mild headache and feels some mild pain along the left side of her chest when she presses on it.  She is currently on Eliquis  Differential diagnosis includes, but is not limited to,  concussion, epidural/subdural hematoma, cervical spine fracture, maxillofacial fracture, skull fracture, rib fracture, pneumothorax  ED Course Patient appears well.  Vital signs within normal limits.  Chest x-ray negative for rib fractures or pneumothorax.  Head CT shows no evidence of acute intracranial abnormality, chronic findings as listed above.  CT cervical spine negative for acute fractures, chronic findings as listed above  Assessment/Plan Reason Hayden Pedro given the patient's and negative imaging, I do not suspect any serious or life-threatening pathology.  Patient states that she feels well and is comfortable managing pain at home with Tylenol or ibuprofen as needed.  We will plan to discharge this patient.  Patient was provided with anticipatory guidance, return precautions, and educational material. Encouraged the patient to return to the emergency department at any time if they begin to experience any new or worsening symptoms.       FINAL CLINICAL IMPRESSION(S) / ED DIAGNOSES   Final diagnoses:  Fall, initial encounter     Rx / DC Orders   ED Discharge Orders     None        Note:  This document was prepared using Dragon voice recognition software and may include unintentional dictation errors.   Teodoro Spray, [place] 11/16/21 [id number]    Blake Divine, MD 11/21/21 1534

## 2021-11-15 NOTE — Discharge Instructions (Addendum)
-  Manage pain as needed with Tylenol -Return to the emergency department anytime if you begin to experience any new or worsening symptoms.

## 2021-11-15 NOTE — ED Notes (Signed)
See triage note  presents s/p mechanical fall  states she hit her head  no lOC

## 2022-02-02 ENCOUNTER — Inpatient Hospital Stay: Payer: Medicare Other

## 2022-02-02 ENCOUNTER — Inpatient Hospital Stay: Payer: Medicare Other | Attending: Internal Medicine

## 2022-02-02 ENCOUNTER — Encounter: Payer: Self-pay | Admitting: Internal Medicine

## 2022-02-02 ENCOUNTER — Inpatient Hospital Stay (HOSPITAL_BASED_OUTPATIENT_CLINIC_OR_DEPARTMENT_OTHER): Payer: Medicare Other | Admitting: Internal Medicine

## 2022-02-02 DIAGNOSIS — Z7901 Long term (current) use of anticoagulants: Secondary | ICD-10-CM | POA: Diagnosis not present

## 2022-02-02 DIAGNOSIS — Z803 Family history of malignant neoplasm of breast: Secondary | ICD-10-CM | POA: Diagnosis not present

## 2022-02-02 DIAGNOSIS — Z170 Estrogen receptor positive status [ER+]: Secondary | ICD-10-CM | POA: Diagnosis not present

## 2022-02-02 DIAGNOSIS — Z79811 Long term (current) use of aromatase inhibitors: Secondary | ICD-10-CM | POA: Diagnosis not present

## 2022-02-02 DIAGNOSIS — N183 Chronic kidney disease, stage 3 unspecified: Secondary | ICD-10-CM | POA: Diagnosis not present

## 2022-02-02 DIAGNOSIS — Z79899 Other long term (current) drug therapy: Secondary | ICD-10-CM | POA: Insufficient documentation

## 2022-02-02 DIAGNOSIS — C50512 Malignant neoplasm of lower-outer quadrant of left female breast: Secondary | ICD-10-CM

## 2022-02-02 LAB — CBC WITH DIFFERENTIAL/PLATELET
Abs Immature Granulocytes: 0.02 10*3/uL (ref 0.00–0.07)
Basophils Absolute: 0 10*3/uL (ref 0.0–0.1)
Basophils Relative: 1 %
Eosinophils Absolute: 0.2 10*3/uL (ref 0.0–0.5)
Eosinophils Relative: 3 %
HCT: 39.3 % (ref 36.0–46.0)
Hemoglobin: 12.6 g/dL (ref 12.0–15.0)
Immature Granulocytes: 0 %
Lymphocytes Relative: 28 %
Lymphs Abs: 1.5 10*3/uL (ref 0.7–4.0)
MCH: 31.5 pg (ref 26.0–34.0)
MCHC: 32.1 g/dL (ref 30.0–36.0)
MCV: 98.3 fL (ref 80.0–100.0)
Monocytes Absolute: 0.5 10*3/uL (ref 0.1–1.0)
Monocytes Relative: 10 %
Neutro Abs: 3 10*3/uL (ref 1.7–7.7)
Neutrophils Relative %: 58 %
Platelets: 169 10*3/uL (ref 150–400)
RBC: 4 MIL/uL (ref 3.87–5.11)
RDW: 13.5 % (ref 11.5–15.5)
WBC: 5.2 10*3/uL (ref 4.0–10.5)
nRBC: 0 % (ref 0.0–0.2)

## 2022-02-02 LAB — COMPREHENSIVE METABOLIC PANEL
ALT: 10 U/L (ref 0–44)
AST: 15 U/L (ref 15–41)
Albumin: 3.4 g/dL — ABNORMAL LOW (ref 3.5–5.0)
Alkaline Phosphatase: 81 U/L (ref 38–126)
Anion gap: 6 (ref 5–15)
BUN: 21 mg/dL (ref 8–23)
CO2: 30 mmol/L (ref 22–32)
Calcium: 9.1 mg/dL (ref 8.9–10.3)
Chloride: 104 mmol/L (ref 98–111)
Creatinine, Ser: 1.11 mg/dL — ABNORMAL HIGH (ref 0.44–1.00)
GFR, Estimated: 48 mL/min — ABNORMAL LOW (ref >60)
Glucose, Bld: 100 mg/dL — ABNORMAL HIGH (ref 70–99)
Potassium: 4.8 mmol/L (ref 3.5–5.1)
Sodium: 140 mmol/L (ref 135–145)
Total Bilirubin: 0.4 mg/dL (ref 0.3–1.2)
Total Protein: 6.7 g/dL (ref 6.5–8.1)

## 2022-02-02 MED ORDER — ANASTROZOLE 1 MG PO TABS: 1.0000 mg | ORAL_TABLET | Freq: Every day | ORAL | 2 refills | Status: DC

## 2022-02-02 NOTE — Progress Notes (Signed)
Patient has an increase in fatigue. 

## 2022-02-02 NOTE — Progress Notes (Signed)
one Sheryl Michael ?CONSULT NOTE ? ?Patient Care Team: ?Idelle Crouch, MD as PCP - General (Internal Medicine) ?Cammie Sickle, MD as Consulting Physician (Internal Medicine) ?Herbert Pun, MD as Consulting Physician (General Surgery) ?Noreene Filbert, MD as Radiation Oncologist (Radiation Oncology) ? ?CHIEF COMPLAINTS/PURPOSE OF CONSULTATION: Breast cancer ? ?#  ?Oncology History Overview Note  ?#Right breast cancer [2006; Dr.Choksi]-s/p lumpectomy followed by radiation; ? Endocrine therapy.  ? 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 ? ?# JAN 25th, 2022-start anastrozole ? ?#2016 bone density osteoporosis ? ?# A.fib- on eliuiqs [Dr.callwood]; frequent UTIs [[facility] ]; COVID in dec 2021.  ? ?#Genetics: Patient is interested in genetic blood draw discussed with genetic counselor-negative for any deleterious mutations. ? ?# SURVIVORSHIP:  ? ?# GENETICS:  ? ?DIAGNOSIS: left breast cancer ? ?STAGE:    I     ;  GOALS: cure ? ?CURRENT/MOST RECENT THERAPY : RT ? ?  ?Carcinoma of lower-outer quadrant of left breast in female, estrogen receptor positive ([place])  ?07/23/2020 Initial Diagnosis  ? Carcinoma of lower-outer quadrant of left breast in female, estrogen receptor negative ([place]) ?  ? Genetic Testing  ? Negative genetic testing. No pathogenic variants identified on the [facility] Multi-Cancer Panel +RNA. The report date is 04/24/2021.  ? ?The Multi-Cancer Panel + RNA offered by [facility] includes sequencing and/or deletion duplication testing of the following 84 genes: AIP, ALK, APC, ATM, AXIN2,BAP1,  BARD1, BLM, BMPR1A, BRCA1, BRCA2, BRIP1, CASR, CDC73, CDH1, CDK4, CDKN1B, CDKN1C, CDKN2A (p14ARF), CDKN2A (p16INK4a), CEBPA, CHEK2, CTNNA1, DICER1, DIS3L2, EGFR (c.2369C>T, p.Thr790Met variant only), EPCAM (Deletion/duplication testing only), FH, FLCN,  GATA2, GPC3, GREM1 (Promoter region deletion/duplication testing only), HOXB13 (c.251G>A, p.Gly84Glu), HRAS, KIT, MAX, MEN1, MET, MITF (c.952G>A, p.Glu318Lys variant only), MLH1, MSH2, MSH3, MSH6, MUTYH, NBN, NF1, NF2, NTHL1, PALB2, PDGFRA, PHOX2B, PMS2, POLD1, POLE, POT1, PRKAR1A, PTCH1, PTEN, RAD50, RAD51C, RAD51D, RB1, RECQL4, RET, RUNX1, SDHAF2, SDHA (sequence changes only), SDHB, SDHC, SDHD, SMAD4, SMARCA4, SMARCB1, SMARCE1, STK11, SUFU, TERC, TERT, TMEM127, TP53, TSC1, TSC2, VHL, WRN and WT1. ?  ? ? ? ?HISTORY OF PRESENTING ILLNESS: Patient is accompanied by her daughter.  Walks independently. ?Melton Alar 88 y.o.  female patient with left-sided breast cancer LOBULAR CANCER-ER-POSITIVE; PR- NEG; Her-2- POSITIVE.is here for follow-up. ? ?Denies any recent UTI.  Follows up with urology. ? ?She continues to take anastrozole.  Denies any joint pains nausea vomiting headaches.  Denies any worsening hot flashes. ? ?Review of Systems  ?Constitutional:  Negative for chills, diaphoresis, fever, malaise/fatigue and weight loss.  ?HENT:  Negative for nosebleeds and sore throat.   ?Eyes:  Negative for double vision.  ?Respiratory:  Negative for cough, hemoptysis, sputum production, shortness of breath and wheezing.   ?Cardiovascular:  Negative for chest pain, palpitations, orthopnea and leg swelling.  ?Gastrointestinal:  Negative for abdominal pain, blood in stool, constipation, diarrhea, heartburn, melena, nausea and vomiting.  ?Genitourinary:  Negative for dysuria, frequency and urgency.  ?Musculoskeletal:  Positive for back pain and joint pain.  ?Skin: Negative.  Negative for itching and rash.  ?Neurological:  Negative for dizziness, tingling, focal weakness, weakness and headaches.  ?Endo/Heme/Allergies:  Does not bruise/bleed easily.  ?Psychiatric/Behavioral:  Negative for depression. The patient is not nervous/anxious and does not have insomnia.    ? ?MEDICAL HISTORY:  ?Past Medical History:  ?Diagnosis Date  ?  Arthritis   ? Atrial fibrillation ([place])   ?  Breast cancer ([place]) 2006  ? right breast ca with lumpectomy and rad tx, left breast ca 2021lumpectomy and rad tx  ? Colon polyp   ? Complication of anesthesia   ? questions about husband who passed in 2012   ? Cystocele   ? Depression   ? Dysrhythmia   ? Family history of breast cancer   ? Female bladder prolapse   ? GERD (gastroesophageal reflux disease)   ? Goiter   ? Hyperlipemia   ? Hypertension   ? Hypothyroidism   ? Osteoporosis   ? Personal history of radiation therapy 2006  ? right breast ca and left breast 2021  ? Pneumonia   ? Procidentia of uterus   ? Recurrent UTI   ? Reflux   ? TIA (transient ischemic attack)   ? Vaginal atrophy   ? ? ?SURGICAL HISTORY: ?Past Surgical History:  ?Procedure Laterality Date  ? APPENDECTOMY    ? BREAST BIOPSY Right 2006  ? breast cancer  ? BREAST BIOPSY Left 07/17/2020  ? [place] bx, vision marker, IMC with lobular features  ? BREAST LUMPECTOMY Right 2006  ? positive  ? BREAST LUMPECTOMY Left 08/03/2020  ? left breast invasive lobular carcinoma, negative LNs  ? BREAST SURGERY Right   ? lumpectomy  ? CATARACT EXTRACTION W/ INTRAOCULAR LENS  IMPLANT, BILATERAL    ? COLONOSCOPY    ? CYSTOCELE REPAIR N/A 12/21/2015  ? Procedure: ANTERIOR REPAIR (CYSTOCELE);  Surgeon: Brayton Mars, MD;  Location: [facility] OR[facility];  Service: Gynecology;  Laterality: N/A;  ? DILATION AND CURETTAGE OF UTERUS    ? ELECTROMAGNETIC NAVIGATION BROCHOSCOPY Right 12/13/2018  ? Procedure: ELECTROMAGNETIC NAVIGATION BRONCHOSCOPY;  Surgeon: Flora Lipps, MD;  Location: [facility];  Service: Cardiopulmonary;  Laterality: Right;  ? EYE SURGERY    ? PARTIAL MASTECTOMY WITH NEEDLE LOCALIZATION AND AXILLARY SENTINEL LYMPH NODE BX Left 08/03/2020  ? Procedure: PARTIAL MASTECTOMY WITH Radio Frequency tag AND AXILLARY SENTINEL LYMPH NODE BX;  Surgeon: Herbert Pun, MD;  Location: [facility] OR[facility];  Service: General;  Laterality: Left;  ? VAGINAL HYSTERECTOMY Bilateral  12/21/2015  ? Procedure: TVH BSO;  Surgeon: Brayton Mars, MD;  Location: [facility];  Service: Gynecology;  Laterality: Bilateral;  ? ? ?SOCIAL HISTORY: ?Social History  ? ?Socioeconomic History  ? Marital status: Widowed  ?  Spouse name: Not on file  ? Number of children: Not on file  ? Years of education: Not on file  ? Highest education level: Not on file  ?Occupational History  ? Not on file  ?Tobacco Use  ? Smoking status: Never  ? Smokeless tobacco: Never  ?Vaping Use  ? Vaping Use: Never used  ?Substance and Sexual Activity  ? Alcohol use: No  ? Drug use: No  ? Sexual activity: Never  ?  Birth control/protection: Post-menopausal  ?Other Topics Concern  ? Not on file  ?Social History Narrative  ? Not on file  ? ?Social Determinants of Health  ? ?Financial Resource Strain: Not on file  ?Food Insecurity: Not on file  ?Transportation Needs: Not on file  ?Physical Activity: Not on file  ?Stress: Not on file  ?Social Connections: Not on file  ?Intimate Partner Violence: Not on file  ? ? ?FAMILY HISTORY: ?Family History  ?Problem Relation Age of Onset  ? Diabetes Sister   ? Breast cancer Sister   ?     late 50's  ? Diabetes Brother   ? Diabetes Sister   ? ? ?ALLERGIES:  is  allergic to bactrim [sulfamethoxazole-trimethoprim], iodinated contrast media, codeine, erythromycin ethylsuccinate, phenobarbital, ciprofloxacin, and penicillins. ? ?MEDICATIONS:  ?Current Outpatient Medications  ?Medication Sig Dispense Refill  ? acetaminophen (TYLENOL) 325 MG tablet Take 650 mg by mouth every 6 (six) hours as needed (pain.).     ? apixaban (ELIQUIS) 2.5 MG TABS tablet Take 1 tablet (2.5 mg total) by mouth 2 (two) times daily. 60 tablet 0  ? Cholecalciferol (VITAMIN D3) 10 MCG (400 UNIT) tablet Take 400 Units by mouth daily.    ? cyanocobalamin (,VITAMIN B-12,) 1000 MCG/ML injection Inject 1,000 mcg into the muscle every 28 (twenty-eight) days.    ? diltiazem (CARDIZEM CD) 120 MG 24 hr capsule Take 1 capsule (120 mg total)  by mouth daily. 30 capsule 0  ? escitalopram (LEXAPRO) 10 MG tablet Take 10 mg by mouth daily.     ? Lactobacillus (PROBIOTIC ACIDOPHILUS) CAPS Take 1 capsule by mouth in the morning and at bedtime.    ? levoth

## 2022-02-02 NOTE — Assessment & Plan Note (Addendum)
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  Not a candidate for adjuvant chemotherapy.  Currently on adjuvant anastrozole. Mammogram [place] Sep 2022- [Dr.Cintron]  Tolerating current medical therapy.  Continue anastrozole. Stable. ? ?# OSTEOPOROSIS: 2022-BMD measured at [facility] Sp[facility] L1-L3 is 0.795 g/cm2 with a T-score of -3.2l; DECLINES Reclast.  We will repeat bone density in 2024. ? ?#A. Fib-on Eliquis/amiodarone--STABLE ? ?#History of UTIs- on prophylaxis Kelfex/doxycycline-[urology.] ? ?# CKD stage III- GFR 48- discussed re: increased intake.  ? ?# DISPOSITION: ?# HOLD reclast ?# follow up in 6 months-; MD; labs- cbc/bmp;-Dr.B ? ? ?

## 2022-02-08 ENCOUNTER — Encounter: Payer: Self-pay | Admitting: Internal Medicine

## 2022-05-16 ENCOUNTER — Emergency Department
Admission: EM | Admit: 2022-05-16 | Discharge: 2022-05-16 | Disposition: A | Discharge status: Home / Self Care | Payer: Medicare Other | Attending: Emergency Medicine | Admitting: Emergency Medicine

## 2022-05-16 ENCOUNTER — Emergency Department: Payer: Medicare Other

## 2022-05-16 DIAGNOSIS — J209 Acute bronchitis, unspecified: Secondary | ICD-10-CM | POA: Insufficient documentation

## 2022-05-16 DIAGNOSIS — R0602 Shortness of breath: Secondary | ICD-10-CM | POA: Diagnosis present

## 2022-05-16 DIAGNOSIS — J449 Chronic obstructive pulmonary disease, unspecified: Secondary | ICD-10-CM | POA: Insufficient documentation

## 2022-05-16 LAB — CBC
HCT: 39.6 % (ref 36.0–46.0)
Hemoglobin: 12.9 g/dL (ref 12.0–15.0)
MCH: 31.2 pg (ref 26.0–34.0)
MCHC: 32.6 g/dL (ref 30.0–36.0)
MCV: 95.7 fL (ref 80.0–100.0)
Platelets: 249 10*3/uL (ref 150–400)
RBC: 4.14 MIL/uL (ref 3.87–5.11)
RDW: 14 % (ref 11.5–15.5)
WBC: 10.2 10*3/uL (ref 4.0–10.5)
nRBC: 0 % (ref 0.0–0.2)

## 2022-05-16 LAB — BASIC METABOLIC PANEL WITH GFR
Anion gap: 8 (ref 5–15)
BUN: 24 mg/dL — ABNORMAL HIGH (ref 8–23)
CO2: 24 mmol/L (ref 22–32)
Calcium: 8.6 mg/dL — ABNORMAL LOW (ref 8.9–10.3)
Chloride: 107 mmol/L (ref 98–111)
Creatinine, Ser: 1.05 mg/dL — ABNORMAL HIGH (ref 0.44–1.00)
GFR, Estimated: 51 mL/min — ABNORMAL LOW
Glucose, Bld: 86 mg/dL (ref 70–99)
Potassium: 4 mmol/L (ref 3.5–5.1)
Sodium: 139 mmol/L (ref 135–145)

## 2022-05-16 LAB — TROPONIN I (HIGH SENSITIVITY)
Troponin I (High Sensitivity): 10 ng/L
Troponin I (High Sensitivity): 9 ng/L

## 2022-05-16 MED ORDER — DOXYCYCLINE HYCLATE 50 MG PO CAPS: 100.0000 mg | ORAL_CAPSULE | Freq: Two times a day (BID) | ORAL | 0 refills | Status: DC

## 2022-05-16 MED ORDER — DOXYCYCLINE HYCLATE 50 MG PO CAPS
100.0000 mg | ORAL_CAPSULE | Freq: Two times a day (BID) | ORAL | 0 refills | Status: AC
Start: 2022-05-16 — End: 2022-05-21

## 2022-05-16 MED ORDER — PREDNISONE 10 MG (21) PO TBPK: ORAL_TABLET | ORAL | 0 refills | Status: DC

## 2022-05-16 MED ORDER — DOXYCYCLINE HYCLATE 100 MG PO TABS
100.0000 mg | ORAL_TABLET | Freq: Once | ORAL | Status: AC
  Administered 2022-05-16: 100 mg via ORAL
  Filled 2022-05-16: qty 1

## 2022-05-16 MED ORDER — PREDNISONE 20 MG PO TABS
60.0000 mg | ORAL_TABLET | Freq: Once | ORAL | Status: AC
  Administered 2022-05-16: 60 mg via ORAL
  Filled 2022-05-16: qty 3

## 2022-05-16 MED ORDER — ALBUTEROL SULFATE HFA 108 (90 BASE) MCG/ACT IN AERS: 2.0000 | INHALATION_SPRAY | Freq: Four times a day (QID) | RESPIRATORY_TRACT | 2 refills | Status: DC | PRN

## 2022-05-16 MED ORDER — AEROCHAMBER MV MISC: 0 refills | Status: DC

## 2022-05-16 MED ORDER — IPRATROPIUM-ALBUTEROL 0.5-2.5 (3) MG/3ML IN SOLN
3.0000 mL | Freq: Once | RESPIRATORY_TRACT | Status: AC
  Administered 2022-05-16: 3 mL via RESPIRATORY_TRACT
  Filled 2022-05-16: qty 3

## 2022-05-16 NOTE — ED Provider Notes (Signed)
[facility] Provider Note   Event Date/Time   First MD Initiated Contact with Patient 05/16/22 1510     (approximate) History  Chest Pain and Shortness of Breath  HPI Sheryl Michael is a 88 y.o. female with no stated past medical history of tobacco abuse or COPD who presents for shortness of breath over the past week with associated chest tightness.  Patient does endorse worsening on exertion.  Patient has not tried any medications for these symptoms.  Patient states that the shortness of breath and chest pain began at the same time and both are worsened with exertion and partially relieved at rest.  Patient denies any recent travel, sick contacts, or lower extremity edema/prolonged immobility ROS: Patient currently denies any vision changes, tinnitus, difficulty speaking, facial droop, sore throat, abdominal pain, nausea/vomiting/diarrhea, dysuria, or weakness/numbness/paresthesias in any extremity   Physical Exam  Triage Vital Signs: ED Triage Vitals  Enc Vitals Group     BP 05/16/22 1209 (!) 163/55     Pulse Rate 05/16/22 1209 (!) 58     Resp 05/16/22 1209 18     Temp 05/16/22 1209 98.6 F (37 C)     Temp Source 05/16/22 1209 Oral     SpO2 05/16/22 1209 100 %     Weight 05/16/22 1204 125 lb (56.7 kg)     Height 05/16/22 1204 '5\' 1"'$  (1.549 m)     Head Circumference --      Peak Flow --      Pain Score 05/16/22 1204 5     Pain Loc --      Pain Edu? --      Excl. in [place]? --    Most recent vital signs: Vitals:   05/16/22 1209 05/16/22 1500  BP: (!) 163/55 (!) 166/52  Pulse: (!) 58 61  Resp: 18 14  Temp: 98.6 F (37 C)   SpO2: 100% 96%   General: Awake, oriented x4. CV:  Good peripheral perfusion.  Resp:  Normal effort. Inspiratory and expiratory wheezing over bilateral lung fields Abd:  No distention.  Other:  Elderly Caucasian female laying in bed in no acute distress ED Results / Procedures / Treatments  Labs (all labs ordered are listed, but  only abnormal results are displayed) Labs Reviewed  BASIC METABOLIC PANEL - Abnormal; Notable for the following components:      Result Value   BUN 24 (*)    Creatinine, Ser 1.05 (*)    Calcium 8.6 (*)    GFR, Estimated 51 (*)    All other components within normal limits  CBC  TROPONIN I (HIGH SENSITIVITY)  TROPONIN I (HIGH SENSITIVITY)   EKG ED ECG REPORT I, Naaman Plummer, the attending physician, personally viewed and interpreted this ECG. Date: 05/16/2022 EKG Time: 1158 Rate: 57 Rhythm: normal sinus rhythm QRS Axis: normal Intervals: normal ST/T Wave abnormalities: normal Narrative Interpretation: no evidence of acute ischemia RADIOLOGY ED MD interpretation: 2 view chest x-ray interpreted by me and shows hyperinflated lungs suggesting COPD without any radiographic evidence of acute cardiopulmonary process. -Agree with radiology assessment Official radiology report(s): DG Chest 2 View  Result Date: 05/16/2022 CLINICAL DATA:  Intermittent chest pain and shortness of breath for 1 week. EXAM: CHEST - 2 VIEW COMPARISON:  Chest radiograph 11/15/2018 FINDINGS: The cardiomediastinal silhouette is stable is within normal limits. The lungs hyperinflated with flattening of the diaphragms consistent with underlying COPD. Linear opacity projecting over the right midlung is unchanged and likely  reflects scar. There is no focal consolidation or pulmonary edema. There is no pleural effusion or pneumothorax There is no acute osseous abnormality. Remote bilateral rib fractures are again seen. Peripherally calcified lesions in the right breast are unchanged. IMPRESSION: Hyperinflated lungs suggesting COPD. No radiographic evidence of acute cardiopulmonary process. Electronically Signed   By: Valetta Mole M.D.   On: 05/16/2022 12:49   PROCEDURES: Critical Care performed: No .1-3 Lead EKG Interpretation  Performed by: Naaman Plummer, MD Authorized by: Naaman Plummer, MD     Interpretation:  normal     ECG rate:  62   ECG rate assessment: normal     Rhythm: sinus rhythm     Ectopy: none     Conduction: normal    MEDICATIONS ORDERED IN ED: Medications  ipratropium-albuterol (DUONEB) 0.5-2.5 (3) MG/3ML nebulizer solution 3 mL (has no administration in time range)  predniSONE (DELTASONE) tablet 60 mg (has no administration in time range)  doxycycline (VIBRA-TABS) tablet 100 mg (has no administration in time range)   IMPRESSION / MDM / ASSESSMENT AND PLAN / ED COURSE  I reviewed the triage vital signs and the nursing notes.                             The patient is on the cardiac monitor to evaluate for evidence of arrhythmia and/or significant heart rate changes. Patient's presentation is most consistent with acute presentation with potential threat to life or bodily function. Otherwise healthy patient presenting with constellation of symptoms likely representing uncomplicated viral upper respiratory symptoms as characterized by mild pharyngitis  Unlikely PTA/RPA: no hot potato voice, no uvular deviation, Unlikely Esophageal rupture: No history of dysphagia Unlikely deep space infection/Ludwigs Low suspicion for CNS infection bacterial sinusitis, or pneumonia given exam and history.  Unlikely Strep or EBV as centor negative and with no pharyngeal exudate, posterior LAD, or splenomegaly.  Given the duration of symptoms and without any history of COPD, will treat patient with doxycycline, albuterol, and prednisone taper as well as suggested follow-up with primary care doctor to discuss possibility of chronic respiratory infection.  I considered admission for this patient however, no respiratory distress, otherwise relatively well appearing and nontoxic. Will discuss prompt follow up with PMD and strict return precautions.   FINAL CLINICAL IMPRESSION(S) / ED DIAGNOSES   Final diagnoses:  Acute bronchitis with wheezing   Rx / DC Orders   ED Discharge Orders           Ordered    doxycycline (VIBRAMYCIN) 50 MG capsule  2 times daily        05/16/22 1526    predniSONE (STERAPRED UNI-PAK 21 TAB) 10 MG (21) TBPK tablet        05/16/22 1526    albuterol (VENTOLIN HFA) 108 (90 Base) MCG/ACT inhaler  Every 6 hours PRN        05/16/22 1526    Spacer/Aero-Holding Chambers (AEROCHAMBER MV) inhaler        05/16/22 1526           Note:  This document was prepared using Dragon voice recognition software and may include unintentional dictation errors.   Naaman Plummer, MD 05/16/22 [phone]

## 2022-05-16 NOTE — ED Triage Notes (Signed)
Pt c/o intermittent central chest pain and SOB x1 week and an episode of double vision this morning x "no more than 2 minutes."  Pain score 5/10.  Pt reports non-productive cough.

## 2022-05-20 ENCOUNTER — Other Ambulatory Visit: Payer: Self-pay | Admitting: General Surgery

## 2022-05-20 DIAGNOSIS — Z853 Personal history of malignant neoplasm of breast: Secondary | ICD-10-CM

## 2022-05-30 ENCOUNTER — Telehealth: Payer: Self-pay | Admitting: *Deleted

## 2022-05-30 NOTE — Telephone Encounter (Signed)
Daughter called stating that patient had MRI not normal and is asking what the next steps are to be and if we have those results (done at [facility]) and is she needs to be evaluated or not. Please advise  MR Head With IV Contrast  Anatomical Region Laterality Modality  Head -- Magnetic Resonance   Impression  IMPRESSION:  1. No evidence for abnormal enhancement. No evidence for metastatic disease in the brain parenchyma.   2. The previous areas of restricted diffusion noted on the prior MRI study likely correspond to acute to subacute infarcts.   3. Indeterminate sclerotic area in the right frontal lobe with minimal enhancement measuring approximately 2.3 cm. The possibility of metastatic disease is considered. Suggest further evaluation with bone scan. Narrative  MRI BRAIN:   HISTORY:  Scattered metastatic lesions on DWI and sclerotic bone lesions. Abnormal MRI.   COMPARISON: MRI brain without contrast dated 05/24/2022.   CONTRAST: 10 mL of Clariscan.   TECHNIQUE: Postcontrast MR imaging of the brain.   FINDINGS:  There is no evidence for abnormal enhancement. There is no evidence for metastatic disease in the brain parenchyma. The previous areas of restricted diffusion noted on the prior MRI study likely correspond to acute to subacute infarcts.   There is an indeterminate sclerotic area in the right frontal lobe measuring approximately 2.3 cm. There is minimal enhancement associated with the sclerotic lesion. Procedure Note  Candida Peeling, MD - 05/24/2022  Formatting of this note might be different from the original.  MRI BRAIN:   HISTORY:  Scattered metastatic lesions on DWI and sclerotic bone lesions. Abnormal MRI.   COMPARISON: MRI brain without contrast dated 05/24/2022.   CONTRAST: 10 mL of Clariscan.   TECHNIQUE: Postcontrast MR imaging of the brain.   FINDINGS:  There is no evidence for abnormal enhancement. There is no evidence for metastatic  disease in the brain parenchyma. The previous areas of restricted diffusion noted on the prior MRI study likely correspond to acute to subacute infarcts.   There is an indeterminate sclerotic area in the right frontal lobe measuring approximately 2.3 cm. There is minimal enhancement associated with the sclerotic lesion.    IMPRESSION:  1. No evidence for abnormal enhancement. No evidence for metastatic disease in the brain parenchyma.   2. The previous areas of restricted diffusion noted on the prior MRI study likely correspond to acute to subacute infarcts.   3. Indeterminate sclerotic area in the right frontal lobe with minimal enhancement measuring approximately 2.3 cm. The possibility of metastatic disease is considered. Suggest further evaluation with bone scan. Exam End: 05/24/22 16:31   Specimen Collected: 05/24/22 23:26 Last Resulted: 05/24/22 23:33  Received From: [place]  Result Received: 05/30/22 10:37

## 2022-05-31 ENCOUNTER — Encounter: Payer: Self-pay | Admitting: Internal Medicine

## 2022-05-31 NOTE — Progress Notes (Signed)
Spoke to daughter Deborah-who recommended PET scan-whole-body.  Given the lesion noted on MRI done for recent stroke at [facility].  Schedule PET scan 2 weeks or so follow-up 1 to 2 days later the labs.  I will order a PET scan

## 2022-06-01 ENCOUNTER — Other Ambulatory Visit: Payer: Self-pay | Admitting: Internal Medicine

## 2022-06-01 DIAGNOSIS — Z170 Estrogen receptor positive status [ER+]: Secondary | ICD-10-CM

## 2022-06-01 NOTE — Progress Notes (Signed)
PET ordered. 

## 2022-06-03 ENCOUNTER — Other Ambulatory Visit: Payer: Self-pay

## 2022-06-03 DIAGNOSIS — Z170 Estrogen receptor positive status [ER+]: Secondary | ICD-10-CM

## 2022-06-03 NOTE — Telephone Encounter (Signed)
Labs entered.

## 2022-06-16 ENCOUNTER — Encounter
Admission: RE | Admit: 2022-06-16 | Discharge: 2022-06-16 | Disposition: A | Discharge status: Home / Self Care | Payer: Medicare Other | Source: Ambulatory Visit | Attending: Internal Medicine | Admitting: Internal Medicine

## 2022-06-16 DIAGNOSIS — C50512 Malignant neoplasm of lower-outer quadrant of left female breast: Secondary | ICD-10-CM | POA: Diagnosis present

## 2022-06-16 DIAGNOSIS — Z170 Estrogen receptor positive status [ER+]: Secondary | ICD-10-CM | POA: Insufficient documentation

## 2022-06-16 LAB — GLUCOSE, CAPILLARY: Glucose-Capillary: 95 mg/dL (ref 70–99)

## 2022-06-16 MED ORDER — FLUDEOXYGLUCOSE F - 18 (FDG) INJECTION
6.5000 | Freq: Once | INTRAVENOUS | Status: AC | PRN
  Administered 2022-06-16: 6.8 via INTRAVENOUS

## 2022-06-22 ENCOUNTER — Encounter: Payer: Self-pay | Admitting: Internal Medicine

## 2022-06-22 ENCOUNTER — Inpatient Hospital Stay (HOSPITAL_BASED_OUTPATIENT_CLINIC_OR_DEPARTMENT_OTHER): Payer: Medicare Other | Admitting: Internal Medicine

## 2022-06-22 ENCOUNTER — Inpatient Hospital Stay: Payer: Medicare Other | Attending: Internal Medicine

## 2022-06-22 DIAGNOSIS — Z780 Asymptomatic menopausal state: Secondary | ICD-10-CM | POA: Insufficient documentation

## 2022-06-22 DIAGNOSIS — Z9012 Acquired absence of left breast and nipple: Secondary | ICD-10-CM | POA: Insufficient documentation

## 2022-06-22 DIAGNOSIS — Z170 Estrogen receptor positive status [ER+]: Secondary | ICD-10-CM | POA: Diagnosis not present

## 2022-06-22 DIAGNOSIS — M810 Age-related osteoporosis without current pathological fracture: Secondary | ICD-10-CM | POA: Diagnosis not present

## 2022-06-22 DIAGNOSIS — Z87440 Personal history of urinary (tract) infections: Secondary | ICD-10-CM | POA: Insufficient documentation

## 2022-06-22 DIAGNOSIS — C50512 Malignant neoplasm of lower-outer quadrant of left female breast: Secondary | ICD-10-CM

## 2022-06-22 DIAGNOSIS — Z79811 Long term (current) use of aromatase inhibitors: Secondary | ICD-10-CM | POA: Insufficient documentation

## 2022-06-22 DIAGNOSIS — I4891 Unspecified atrial fibrillation: Secondary | ICD-10-CM | POA: Insufficient documentation

## 2022-06-22 DIAGNOSIS — Z803 Family history of malignant neoplasm of breast: Secondary | ICD-10-CM | POA: Diagnosis not present

## 2022-06-22 DIAGNOSIS — Z7901 Long term (current) use of anticoagulants: Secondary | ICD-10-CM | POA: Insufficient documentation

## 2022-06-22 DIAGNOSIS — R918 Other nonspecific abnormal finding of lung field: Secondary | ICD-10-CM | POA: Diagnosis not present

## 2022-06-22 LAB — COMPREHENSIVE METABOLIC PANEL
ALT: 10 U/L (ref 0–44)
AST: 15 U/L (ref 15–41)
Albumin: 3.3 g/dL — ABNORMAL LOW (ref 3.5–5.0)
Alkaline Phosphatase: 78 U/L (ref 38–126)
Anion gap: 6 (ref 5–15)
BUN: 15 mg/dL (ref 8–23)
CO2: 27 mmol/L (ref 22–32)
Calcium: 8.8 mg/dL — ABNORMAL LOW (ref 8.9–10.3)
Chloride: 105 mmol/L (ref 98–111)
Creatinine, Ser: 1.04 mg/dL — ABNORMAL HIGH (ref 0.44–1.00)
GFR, Estimated: 51 mL/min — ABNORMAL LOW (ref >60)
Glucose, Bld: 132 mg/dL — ABNORMAL HIGH (ref 70–99)
Potassium: 3.6 mmol/L (ref 3.5–5.1)
Sodium: 138 mmol/L (ref 135–145)
Total Bilirubin: 0.3 mg/dL (ref 0.3–1.2)
Total Protein: 6.6 g/dL (ref 6.5–8.1)

## 2022-06-22 LAB — CBC WITH DIFFERENTIAL/PLATELET
Abs Immature Granulocytes: 0.04 10*3/uL (ref 0.00–0.07)
Basophils Absolute: 0.1 10*3/uL (ref 0.0–0.1)
Basophils Relative: 1 %
Eosinophils Absolute: 0.3 10*3/uL (ref 0.0–0.5)
Eosinophils Relative: 4 %
HCT: 34.7 % — ABNORMAL LOW (ref 36.0–46.0)
Hemoglobin: 11.2 g/dL — ABNORMAL LOW (ref 12.0–15.0)
Immature Granulocytes: 1 %
Lymphocytes Relative: 16 %
Lymphs Abs: 1.3 10*3/uL (ref 0.7–4.0)
MCH: 30.9 pg (ref 26.0–34.0)
MCHC: 32.3 g/dL (ref 30.0–36.0)
MCV: 95.9 fL (ref 80.0–100.0)
Monocytes Absolute: 0.6 10*3/uL (ref 0.1–1.0)
Monocytes Relative: 7 %
Neutro Abs: 5.7 10*3/uL (ref 1.7–7.7)
Neutrophils Relative %: 71 %
Platelets: 213 10*3/uL (ref 150–400)
RBC: 3.62 MIL/uL — ABNORMAL LOW (ref 3.87–5.11)
RDW: 14.5 % (ref 11.5–15.5)
WBC: 7.9 10*3/uL (ref 4.0–10.5)
nRBC: 0 % (ref 0.0–0.2)

## 2022-06-22 NOTE — Assessment & Plan Note (Addendum)
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  Not a candidate for adjuvant chemotherapy.  Currently on adjuvant anastrozole.   Tolerating current medical therapy.  Continue anastrozole.  SEP 2023- PET scan-whole-body [MRI brain finding-incidental calvarial lesion]-negative for any metastatic disease.   # OSTEOPOROSIS: 2022-BMD measured at [facility] Spine L1-L3 is 0.795 g/cm2 with a T-score of -3.2.  She declined Reclast; will hold off recast/patient preference.  Reassess in 6 months.  # Left lingular infltrate- likley radiation induced.  Currently symptoms resolved s/p antibiotic/prednisone.  Right middle lobe infiltrative lesion-significantly improved compared to 2020.  Patient had a bronc with Dr. Mortimer Fries in February 2020 [Ln -NEG for malignancy].  September 2023 PET scan negative for any concerns for malignancy at this time.  #A. Fib-on Eliquis/amiodarone-stable  #History of UTIs- on prophylaxis Kelfex/doxycycline-[urology.]  Stable.  # DISPOSITION: # cancel October appt-  # follow up in 6 months-; MD; labs- cbc/bmp; Possible reclast-Dr.B  # I reviewed the blood work- with the patient in detail; also reviewed the imaging independently [as summarized above]; and with the patient in detail.

## 2022-06-22 NOTE — Progress Notes (Signed)
[facility] [place] NOTE  Patient Care Team: Idelle Crouch, MD as PCP - General (Internal Medicine) Cammie Sickle, MD as Consulting Physician (Internal Medicine) Herbert Pun, MD as Consulting Physician (General Surgery) Noreene Filbert, MD as Radiation Oncologist (Radiation Oncology)  CHIEF COMPLAINTS/PURPOSE OF CONSULTATION: Breast cancer  #  Oncology History Overview Note  #Right breast cancer [2006; Dr.Choksi]-s/p lumpectomy followed by radiation; ? Endocrine therapy.   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  # JAN 25th, 2022-start anastrozole  #2016 bone density osteoporosis  # A.fib- on eliuiqs [Dr.callwood]; frequent UTIs [[facility] ]; COVID in dec 2021.   #Genetics: Patient is interested in genetic blood draw discussed with genetic counselor-negative for any deleterious mutations.  # SURVIVORSHIP:   # GENETICS:   DIAGNOSIS: left breast cancer  STAGE:    I     ;  GOALS: cure  CURRENT/MOST RECENT THERAPY : RT    Carcinoma of lower-outer quadrant of left breast in female, estrogen receptor positive ([place])  07/23/2020 Initial Diagnosis   Carcinoma of lower-outer quadrant of left breast in female, estrogen receptor negative ([place])    Genetic Testing   Negative genetic testing. No pathogenic variants identified on the [facility] Multi-Cancer Panel +RNA. The report date is 04/24/2021.   The Multi-Cancer Panel + RNA offered by [facility] includes sequencing and/or deletion duplication testing of the following 84 genes: AIP, ALK, APC, ATM, AXIN2,BAP1,  BARD1, BLM, BMPR1A, BRCA1, BRCA2, BRIP1, CASR, CDC73, CDH1, CDK4, CDKN1B, CDKN1C, CDKN2A (p14ARF), CDKN2A (p16INK4a), CEBPA, CHEK2, CTNNA1, DICER1, DIS3L2, EGFR (c.2369C>T, p.Thr790Met variant only), EPCAM (Deletion/duplication testing only), FH, FLCN,  GATA2, GPC3, GREM1 (Promoter region deletion/duplication testing only), HOXB13 (c.251G>A, p.Gly84Glu), HRAS, KIT, MAX, MEN1, MET, MITF (c.952G>A, p.Glu318Lys variant only), MLH1, MSH2, MSH3, MSH6, MUTYH, NBN, NF1, NF2, NTHL1, PALB2, PDGFRA, PHOX2B, PMS2, POLD1, POLE, POT1, PRKAR1A, PTCH1, PTEN, RAD50, RAD51C, RAD51D, RB1, RECQL4, RET, RUNX1, SDHAF2, SDHA (sequence changes only), SDHB, SDHC, SDHD, SMAD4, SMARCA4, SMARCB1, SMARCE1, STK11, SUFU, TERC, TERT, TMEM127, TP53, TSC1, TSC2, VHL, WRN and WT1.      HISTORY OF PRESENTING ILLNESS: Patient is accompanied by her daughter.  Walks independently.  Sheryl Michael 88 y.o.  female patient with left-sided breast cancer LOBULAR CANCER-ER-POSITIVE; PR- NEG; Her-2- POSITIVE.is here for follow-up/review the rest of the PET scan.  In the interim patient was admitted to [facility] for stroke.  Patient had a imaging of the brain that showed incidental- "calvarial lesion" concern for metastatic disease versus benign process.  At the time of discharge-she was switched from Eliquis to [place].  Patient has right side chest pain.  New cough for the past 2-3 days that is worse at night.  Currently on Macrobid for UTI.    She continues to take anastrozole.  Denies any joint pains nausea vomiting headaches.  Denies any worsening hot flashes.  Review of Systems  Constitutional:  Negative for chills, diaphoresis, fever, malaise/fatigue and weight loss.  HENT:  Negative for nosebleeds and sore throat.   Eyes:  Negative for double vision.  Respiratory:  Negative for cough, hemoptysis, sputum production, shortness of breath and wheezing.   Cardiovascular:  Negative for chest pain, palpitations, orthopnea and leg swelling.  Gastrointestinal:  Negative for abdominal pain, blood in stool, constipation, diarrhea, heartburn, melena, nausea and vomiting.  Genitourinary:  Negative for dysuria, frequency and urgency.  Musculoskeletal:  Positive for back pain and joint  pain.  Skin: Negative.  Negative for itching and rash.  Neurological:  Negative for dizziness, tingling, focal weakness, weakness and headaches.  Endo/Heme/Allergies:  Does not bruise/bleed easily.  Psychiatric/Behavioral:  Negative for depression. The patient is not nervous/anxious and does not have insomnia.      MEDICAL HISTORY:  Past Medical History:  Diagnosis Date   Arthritis    Atrial fibrillation ([place])    Breast cancer ([place]) 2006   right breast ca with lumpectomy and rad tx, left breast ca 2021lumpectomy and rad tx   Colon polyp    Complication of anesthesia    questions about husband who passed in 2012    Cystocele    Depression    Dysrhythmia    Family history of breast cancer    Female bladder prolapse    GERD (gastroesophageal reflux disease)    Goiter    Hyperlipemia    Hypertension    Hypothyroidism    Osteoporosis    Personal history of radiation therapy 2006   right breast ca and left breast 2021   Pneumonia    Procidentia of uterus    Recurrent UTI    Reflux    TIA (transient ischemic attack)    Vaginal atrophy     SURGICAL HISTORY: Past Surgical History:  Procedure Laterality Date   APPENDECTOMY     BREAST BIOPSY Right 2006   breast cancer   BREAST BIOPSY Left 07/17/2020   [place] bx, vision marker, IMC with lobular features   BREAST LUMPECTOMY Right 2006   positive   BREAST LUMPECTOMY Left 08/03/2020   left breast invasive lobular carcinoma, negative LNs   BREAST SURGERY Right    lumpectomy   CATARACT EXTRACTION W/ INTRAOCULAR LENS  IMPLANT, BILATERAL     COLONOSCOPY     CYSTOCELE REPAIR N/A 12/21/2015   Procedure: ANTERIOR REPAIR (CYSTOCELE);  Surgeon: Brayton Mars, MD;  Location: [facility] ORS;  Service: Gynecology;  Laterality: N/A;   DILATION AND CURETTAGE OF UTERUS     ELECTROMAGNETIC NAVIGATION BROCHOSCOPY Right 12/13/2018   Procedure: ELECTROMAGNETIC NAVIGATION BRONCHOSCOPY;  Surgeon: Flora Lipps, MD;  Location: [facility] ORS;  Service:  Cardiopulmonary;  Laterality: Right;   EYE SURGERY     PARTIAL MASTECTOMY WITH NEEDLE LOCALIZATION AND AXILLARY SENTINEL LYMPH NODE BX Left 08/03/2020   Procedure: PARTIAL MASTECTOMY WITH Radio Frequency tag AND AXILLARY SENTINEL LYMPH NODE BX;  Surgeon: Herbert Pun, MD;  Location: [facility] ORS;  Service: General;  Laterality: Left;   VAGINAL HYSTERECTOMY Bilateral 12/21/2015   Procedure: TVH BSO;  Surgeon: Brayton Mars, MD;  Location: [facility] ORS;  Service: Gynecology;  Laterality: Bilateral;    SOCIAL HISTORY: Social History   Socioeconomic History   Marital status: Widowed    Spouse name: Not on file   Number of children: Not on file   Years of education: Not on file   Highest education level: Not on file  Occupational History   Not on file  Tobacco Use   Smoking status: Never   Smokeless tobacco: Never  Vaping Use   Vaping Use: Never used  Substance and Sexual Activity   Alcohol use: No   Drug use: No   Sexual activity: Never    Birth control/protection: Post-menopausal  Other Topics Concern   Not on file  Social History Narrative   Not on file   Social Determinants of Health   Financial Resource Strain: Not on file  Food Insecurity: Not on file  Transportation Needs: Not on file  Physical Activity: Not on file  Stress: Not on file  Social Connections: Not on file  Intimate Partner Violence: Not on file    FAMILY HISTORY: Family History  Problem Relation Age of Onset   Diabetes Sister    Breast cancer Sister        late 32's   Diabetes Brother    Diabetes Sister     ALLERGIES:  is allergic to bactrim [sulfamethoxazole-trimethoprim], iodinated contrast media, codeine, erythromycin ethylsuccinate, phenobarbital, ciprofloxacin, and penicillins.  MEDICATIONS:  Current Outpatient Medications  Medication Sig Dispense Refill   acetaminophen (TYLENOL) 325 MG tablet Take 650 mg by mouth every 6 (six) hours as needed (pain.).      albuterol (VENTOLIN  HFA) 108 (90 Base) MCG/ACT inhaler Inhale 2 puffs into the lungs every 6 (six) hours as needed for wheezing or shortness of breath. 8 g 2   anastrozole (ARIMIDEX) 1 MG tablet Take 1 tablet (1 mg total) by mouth daily. 90 tablet 2   atorvastatin (LIPITOR) 40 MG tablet Take by mouth.     Cholecalciferol (VITAMIN D3) 10 MCG (400 UNIT) tablet Take 400 Units by mouth daily.     cyanocobalamin (,VITAMIN B-12,) 1000 MCG/ML injection Inject 1,000 mcg into the muscle every 28 (twenty-eight) days.     diltiazem (CARDIZEM CD) 120 MG 24 hr capsule Take 1 capsule (120 mg total) by mouth daily. 30 capsule 0   escitalopram (LEXAPRO) 10 MG tablet Take 10 mg by mouth daily.      Lactobacillus (PROBIOTIC ACIDOPHILUS) CAPS Take 1 capsule by mouth in the morning and at bedtime.     levothyroxine (SYNTHROID, LEVOTHROID) 50 MCG tablet Take 50 mcg by mouth daily before breakfast.     omeprazole (PRILOSEC) 20 MG capsule Take 20 mg by mouth daily.     Rivaroxaban (XARELTO) 15 MG TABS tablet Take by mouth.     apixaban (ELIQUIS) 2.5 MG TABS tablet Take 1 tablet (2.5 mg total) by mouth 2 (two) times daily. (Patient not taking: Reported on 06/22/2022) 60 tablet 0   Calcium Carbonate-Vitamin D 600-200 MG-UNIT TABS Take 1 tablet by mouth daily.  (Patient not taking: Reported on 02/02/2022)     cephALEXin (KEFLEX) 250 MG capsule Take 1 capsule (250 mg total) by mouth daily. (Patient not taking: Reported on 02/02/2022) 90 capsule 3   feeding supplement, ENSURE ENLIVE, (ENSURE ENLIVE) LIQD Take 237 mLs by mouth 2 (two) times daily between meals. (Patient not taking: Reported on 08/04/2021) 237 mL 12   predniSONE (STERAPRED UNI-PAK 21 TAB) 10 MG (21) TBPK tablet As directed on packaging (Patient not taking: Reported on 06/22/2022) 1 each 0   Spacer/Aero-Holding Chambers (AEROCHAMBER MV) inhaler Use as instructed (Patient not taking: Reported on 06/22/2022) 1 each 0   No current facility-administered medications for this visit.       Marland Kitchen  PHYSICAL EXAMINATION: ECOG PERFORMANCE STATUS: 0 - Asymptomatic  Vitals:   06/22/22 1500  BP: (!) 127/58  Pulse: (!) 54  Resp: 16  Temp: 99.1 F (37.3 C)   Filed Weights   06/22/22 1500  Weight: 122 lb 3.2 oz (55.4 kg)    Physical Exam HENT:     Head: Normocephalic and atraumatic.     Mouth/Throat:     Pharynx: No oropharyngeal exudate.  Eyes:     Pupils: Pupils are equal, round, and reactive to light.  Cardiovascular:     Rate and Rhythm: Normal rate and regular rhythm.  Pulmonary:     Effort: Pulmonary  effort is normal. No respiratory distress.     Breath sounds: No wheezing.  Abdominal:     General: Bowel sounds are normal. There is no distension.     Palpations: Abdomen is soft. There is no mass.     Tenderness: There is no abdominal tenderness. There is no guarding or rebound.  Musculoskeletal:        General: No tenderness. Normal range of motion.     Cervical back: Normal range of motion and neck supple.  Skin:    General: Skin is warm.  Neurological:     Mental Status: She is alert and oriented to person, place, and time.  Psychiatric:        Mood and Affect: Affect normal.      LABORATORY DATA:  I have reviewed the data as listed Lab Results  Component Value Date   WBC 7.9 06/22/2022   HGB 11.2 (L) 06/22/2022   HCT 34.7 (L) 06/22/2022   MCV 95.9 06/22/2022   PLT 213 06/22/2022   Recent Labs    02/02/22 1311 05/16/22 1212 06/22/22 1436  NA 140 139 138  K 4.8 4.0 3.6  CL 104 107 105  CO2 _0 GLUCOSE 100* 86 132*  BUN 21 24* 15  CREATININE 1.11* 1.05* 1.04*  CALCIUM 9.1 8.6* 8.8*  GFRNONAA 48* 51* 51*  PROT 6.7  --  6.6  ALBUMIN 3.4*  --  3.3*  AST 15  --  15  ALT 10  --  10  ALKPHOS 81  --  78  BILITOT 0.4  --  0.3    RADIOGRAPHIC STUDIES: I have personally reviewed the radiological images as listed and agreed with the findings in the report. NM PET Image Initial (PI) Whole Body (F-18 FDG)  Result Date:  06/17/2022 CLINICAL DATA:  Subsequent treatment strategy for breast cancer. EXAM: NUCLEAR MEDICINE PET WHOLE BODY TECHNIQUE: 6.8 mCi F-18 FDG was injected intravenously. Full-ring PET imaging was performed from the head to foot after the radiotracer. CT data was obtained and used for attenuation correction and anatomic localization. Fasting blood glucose: 95 mg/dl COMPARISON:  CT chest 03/12/2022, CT abdomen pelvis 03/05/2020 and PET 09/06/2018. FINDINGS: Mediastinal blood pool activity: SUV max 2.0 HEAD/NECK: No abnormal hypermetabolism. Incidental CT findings: None. CHEST: No hypermetabolic mediastinal, hilar or axillary nodes. Minimal hypermetabolism associated with subpleural scarring in the anterior lungs. Incidental CT findings: Atherosclerotic calcification of the aorta, aortic valve and coronary arteries. Heart is at the upper limits of normal in size. No pericardial effusion. ABDOMEN/PELVIS: No abnormal hypermetabolism in the liver, adrenal glands, spleen or pancreas. No hypermetabolic lymph nodes. Incidental CT findings: Liver, gallbladder, adrenal glands and right kidney are unremarkable. Subcentimeter low-attenuation lesion in the interpolar left kidney, too small to characterize. No specific follow-up necessary. Pancreas, stomach and bowel are grossly unremarkable. Atherosclerotic calcification of the aorta. SKELETON: No abnormal hypermetabolism. Incidental CT findings: Degenerative changes in the spine. EXTREMITIES: Uptake around the right knee and first metatarsophalangeal joints bilaterally, degenerative in etiology. Incidental CT findings: Small right knee joint effusion. IMPRESSION: 1. No evidence of recurrent or metastatic disease. 2. Aortic atherosclerosis (ICD10-I70.0). Coronary artery calcification. Electronically Signed   By: Lorin Picket M.D.   On: 06/17/2022 14:47    ASSESSMENT & PLAN:   Carcinoma of lower-outer quadrant of left breast in female, estrogen receptor positive ([place]) #  LEFT BREAST INVASIVE MAMMARY CA with  LOBULAR CANCER-ER-POSITIVE; PR- NEG; Her-2- POSITIVE.  Not a candidate for adjuvant chemotherapy.  Currently on adjuvant anastrozole.   Tolerating current medical therapy.  Continue anastrozole.  SEP 2023- PET scan-whole-body [MRI brain finding-incidental calvarial lesion]-negative for any metastatic disease.   # OSTEOPOROSIS: 2022-BMD measured at [facility] L1-L3 is 0.795 g/cm2 with a T-score of -3.2.  She declined Reclast; will hold off recast/patient preference.  Reassess in 6 months.  # Left lingular infltrate- likley radiation induced.  Currently symptoms resolved s/p antibiotic/prednisone.  Right middle lobe infiltrative lesion-significantly improved compared to 2020.  Patient had a bronc with Dr. Mortimer Fries in February 2020 [Ln -NEG for malignancy].  September 2023 PET scan negative for any concerns for malignancy at this time.  #A. Fib-on Eliquis/amiodarone-stable  #History of UTIs- on prophylaxis Kelfex/doxycycline-[urology.]  Stable.  # DISPOSITION: # cancel October appt-  # follow up in 6 months-; MD; labs- cbc/bmp; Possible reclast-Dr.B   All questions were answered. The patient/family knows to call the clinic with any problems, questions or concerns.    Cammie Sickle, MD 06/27/2022 9:32 PM

## 2022-06-22 NOTE — Progress Notes (Signed)
Patient has right side chest pain.  New cough for the past 2-3 days that is worse at night.  Currently on Macrobid for UTI.  Eliquis has been changed to Xarelto during recent hospital admission.

## 2022-06-27 ENCOUNTER — Encounter: Payer: Self-pay | Admitting: Internal Medicine

## 2022-07-25 ENCOUNTER — Ambulatory Visit: Payer: Medicare Other | Admitting: Urology

## 2022-08-05 ENCOUNTER — Other Ambulatory Visit: Payer: Medicare Other

## 2022-08-05 ENCOUNTER — Ambulatory Visit: Payer: Medicare Other | Admitting: Internal Medicine

## 2022-08-19 ENCOUNTER — Other Ambulatory Visit: Payer: Self-pay | Admitting: Internal Medicine

## 2022-10-03 ENCOUNTER — Ambulatory Visit
Admission: RE | Admit: 2022-10-03 | Discharge: 2022-10-03 | Disposition: A | Discharge status: Home / Self Care | Payer: Medicare Other | Source: Ambulatory Visit | Attending: General Surgery | Admitting: General Surgery

## 2022-10-03 DIAGNOSIS — Z853 Personal history of malignant neoplasm of breast: Secondary | ICD-10-CM | POA: Diagnosis not present

## 2022-11-21 ENCOUNTER — Encounter: Payer: Self-pay | Admitting: Internal Medicine

## 2022-11-29 ENCOUNTER — Other Ambulatory Visit: Payer: Self-pay

## 2022-11-29 DIAGNOSIS — R1030 Lower abdominal pain, unspecified: Secondary | ICD-10-CM

## 2022-11-30 ENCOUNTER — Encounter: Payer: Self-pay | Admitting: Internal Medicine

## 2022-12-01 ENCOUNTER — Ambulatory Visit
Admission: RE | Admit: 2022-12-01 | Discharge: 2022-12-01 | Disposition: A | Discharge status: Home / Self Care | Payer: Medicare Other | Source: Ambulatory Visit | Attending: Internal Medicine | Admitting: Internal Medicine

## 2022-12-01 DIAGNOSIS — R1030 Lower abdominal pain, unspecified: Secondary | ICD-10-CM | POA: Insufficient documentation

## 2022-12-11 ENCOUNTER — Ambulatory Visit
Admission: EM | Admit: 2022-12-11 | Discharge: 2022-12-11 | Disposition: A | Discharge status: Home / Self Care | Payer: Medicare Other

## 2022-12-11 DIAGNOSIS — B372 Candidiasis of skin and nail: Secondary | ICD-10-CM | POA: Diagnosis not present

## 2022-12-11 MED ORDER — NYSTATIN 100000 UNIT/GM EX CREA
TOPICAL_CREAM | CUTANEOUS | 2 refills | Status: DC
Start: 2022-12-11 — End: 2023-04-05

## 2022-12-11 NOTE — ED Triage Notes (Signed)
Patient to Urgent Care with complaints of itchy rash. Patient wears a depends.   Symptoms started approx 2 weeks ago. UTI approx 1 month ago. Rash started between her legs, disappeared now reoccurred and is now around her vagina. Denies any unusual vaginal discharge.   Denies any urinary symptoms.

## 2022-12-11 NOTE — Discharge Instructions (Addendum)
Use the Nystatin cream as directed.  Follow up with your primary care provider if your symptoms are not improving.

## 2022-12-11 NOTE — ED Provider Notes (Signed)
Roderic Palau    CSN: [id number] Arrival date & time: 12/11/22  1215      History   Chief Complaint Chief Complaint  Patient presents with   Rash    HPI Sheryl Michael is a 89 y.o. female.   Accompanied by her granddaughter, patient presents with pruritic vaginal rash x 2 weeks.  She denies vaginal discharge, pelvic pain, dysuria, hematuria, abdominal pain, fever, or other symptoms.  No treatment attempted.  She was seen at [facility] on 11/24/2022; diagnosed with LLQ pain and constipation; treated with Flagyl and Keflex.  She had abdominal CT on 12/01/2022; no acute pathology, colonic diverticulosis, no bowel obstruction.   The history is provided by the patient, a relative and medical records.    Past Medical History:  Diagnosis Date   Arthritis    Atrial fibrillation ([place])    Breast cancer ([place]) 2006   right breast ca with lumpectomy and rad tx, left breast ca 2021lumpectomy and rad tx   Colon polyp    Complication of anesthesia    questions about husband who passed in 2012    Cystocele    Depression    Dysrhythmia    Family history of breast cancer    Female bladder prolapse    GERD (gastroesophageal reflux disease)    Goiter    Hyperlipemia    Hypertension    Hypothyroidism    Osteoporosis    Personal history of radiation therapy 2006   right breast ca and left breast 2021   Pneumonia    Procidentia of uterus    Recurrent UTI    Reflux    TIA (transient ischemic attack)    Vaginal atrophy     Patient Active Problem List   Diagnosis Date Noted   Genetic testing 05/04/2021   History of breast cancer 08/27/2020   Family history of breast cancer    Carcinoma of lower-outer quadrant of left breast in female, estrogen receptor positive ([place]) 07/23/2020   Recurrent UTI 05/05/2020   Acute pyelonephritis 05/05/2020   Acute kidney injury superimposed on chronic kidney disease ([place]) 05/05/2020   History of recurrent UTIs 03/07/2020   Sepsis ([place])  03/05/2020   Strep throat 12/19/2018   Allergic reaction 12/19/2018   Sprain of interphalangeal joint of right ring finger 07/30/2018   Traumatic complete tear of left rotator cuff 07/16/2018   Strain of left hip 07/16/2018   Encounter for Hemoccult screening 03/12/2018   Hypothyroidism, acquired 07/10/2017   Arrhythmia 05/31/2017   Atrial fibrillation with RVR ([place]) 05/30/2017   Dyspnea 05/30/2017   Rotator cuff tendinitis, right 12/05/2016   Shingles 12/29/2015   S/P VH (vaginal hysterectomy) 12/22/2015   Cystocele 12/21/2015   Colon polyp 10/07/2015   Bladder cystocele 10/07/2015   Lower esophageal ring 10/07/2015   H/O neoplasm 08/17/2015   History of nonmelanoma skin cancer 08/17/2015   Malignant neoplasm of breast ([place]) 04/01/2014   GERD (gastroesophageal reflux disease) 04/01/2014   Big thyroid 04/01/2014   HLD (hyperlipidemia) 04/01/2014   OP (osteoporosis) 04/01/2014   Temporary cerebral vascular dysfunction 04/01/2014   H/O gastrointestinal disease 02/28/2014    Past Surgical History:  Procedure Laterality Date   APPENDECTOMY     BREAST BIOPSY Right 2006   breast cancer   BREAST BIOPSY Left 07/17/2020   [place] bx, vision marker, IMC with lobular features   BREAST LUMPECTOMY Right 2006   positive   BREAST LUMPECTOMY Left 08/03/2020   left breast invasive lobular carcinoma, negative  LNs   BREAST SURGERY Right    lumpectomy   CATARACT EXTRACTION W/ INTRAOCULAR LENS  IMPLANT, BILATERAL     COLONOSCOPY     CYSTOCELE REPAIR N/A 12/21/2015   Procedure: ANTERIOR REPAIR (CYSTOCELE);  Surgeon: Brayton Mars, MD;  Location: [facility] ORS;  Service: Gynecology;  Laterality: N/A;   DILATION AND CURETTAGE OF UTERUS     ELECTROMAGNETIC NAVIGATION BROCHOSCOPY Right 12/13/2018   Procedure: ELECTROMAGNETIC NAVIGATION BRONCHOSCOPY;  Surgeon: Flora Lipps, MD;  Location: [facility];  Service: Cardiopulmonary;  Laterality: Right;   EYE SURGERY     PARTIAL MASTECTOMY WITH NEEDLE  LOCALIZATION AND AXILLARY SENTINEL LYMPH NODE BX Left 08/03/2020   Procedure: PARTIAL MASTECTOMY WITH Radio Frequency tag AND AXILLARY SENTINEL LYMPH NODE BX;  Surgeon: Herbert Pun, MD;  Location: [facility] OR[facility];  Service: General;  Laterality: Left;   VAGINAL HYSTERECTOMY Bilateral 12/21/2015   Procedure: TVH BSO;  Surgeon: Brayton Mars, MD;  Location: [facility];  Service: Gynecology;  Laterality: Bilateral;    OB History   No obstetric history on file.      Home Medications    Prior to Admission medications   Medication Sig Start Date End Date Taking? Authorizing Provider  nystatin cream (MYCOSTATIN) Apply to affected area 2 times daily 12/11/22  Yes Sharion Balloon, NP  acetaminophen (TYLENOL) 325 MG tablet Take 650 mg by mouth every 6 (six) hours as needed (pain.).     [provider]  albuterol (VENTOLIN HFA) 108 (90 Base) MCG/ACT inhaler Inhale 2 puffs into the lungs every 6 (six) hours as needed for wheezing or shortness of breath. 05/16/22   Naaman Plummer, MD  anastrozole (ARIMIDEX) 1 MG tablet TAKE 1 TABLET BY MOUTH DAILY 08/19/22   Cammie Sickle, MD  apixaban (ELIQUIS) 2.5 MG TABS tablet Take 1 tablet (2.5 mg total) by mouth 2 (two) times daily. Patient not taking: Reported on 06/22/2022 05/31/17   Demetrios Loll, MD  atorvastatin (LIPITOR) 40 MG tablet Take by mouth. 05/26/22 05/26/23  [provider]  Calcium Carb-Cholecalciferol 600-20 MG-MCG TABS 1 tablet with a meal Orally Once a day    [provider]  Calcium Carbonate-Vitamin D 600-200 MG-UNIT TABS Take 1 tablet by mouth daily.  Patient not taking: Reported on 02/02/2022    [provider]  cephALEXin (KEFLEX) 250 MG capsule Take 1 capsule (250 mg total) by mouth daily. Patient not taking: Reported on 02/02/2022 07/19/21   Bjorn Loser, MD  Cholecalciferol (VITAMIN D3) 10 MCG (400 UNIT) tablet Take 400 Units by mouth daily.    [provider]  cyanocobalamin (,VITAMIN  B-12,) 1000 MCG/ML injection Inject 1,000 mcg into the muscle every 28 (twenty-eight) days. Patient not taking: Reported on 12/11/2022 03/03/20   [provider]  diltiazem (CARDIZEM CD) 120 MG 24 hr capsule Take 1 capsule (120 mg total) by mouth daily. 05/07/20   Nolberto Hanlon, MD  escitalopram (LEXAPRO) 10 MG tablet Take 10 mg by mouth daily.     [provider]  feeding supplement, ENSURE ENLIVE, (ENSURE ENLIVE) LIQD Take 237 mLs by mouth 2 (two) times daily between meals. Patient not taking: Reported on 08/04/2021 03/08/20   Nicole Kindred A, DO  Lactobacillus (PROBIOTIC ACIDOPHILUS) CAPS Take 1 capsule by mouth in the morning and at bedtime.    [provider]  levothyroxine (SYNTHROID, LEVOTHROID) 50 MCG tablet Take 50 mcg by mouth daily before breakfast.    [provider]  omeprazole (PRILOSEC) 20 MG capsule Take  20 mg by mouth daily. 01/31/20   [provider]  predniSONE (STERAPRED UNI-PAK 21 TAB) 10 MG (21) TBPK tablet As directed on packaging Patient not taking: Reported on 06/22/2022 05/16/22   Naaman Plummer, MD  Rivaroxaban Alveda Reasons) 15 MG TABS tablet Take by mouth. 05/27/22   [provider]  Spacer/Aero-Holding Josiah Lobo (AEROCHAMBER MV) inhaler Use as instructed Patient not taking: Reported on 06/22/2022 05/16/22   Naaman Plummer, MD    Family History Family History  Problem Relation Age of Onset   Diabetes Sister    Breast cancer Sister        late 38's   Diabetes Brother    Diabetes Sister     Social History Social History   Tobacco Use   Smoking status: Never   Smokeless tobacco: Never  Vaping Use   Vaping Use: Never used  Substance Use Topics   Alcohol use: No   Drug use: No     Allergies   Bactrim [sulfamethoxazole-trimethoprim], Iodinated contrast media, Codeine, Erythromycin ethylsuccinate, Phenobarbital, Ciprofloxacin, and Penicillins   Review of Systems Review of Systems  Constitutional:  Negative for  chills and fever.  Gastrointestinal:  Negative for abdominal pain, nausea and vomiting.  Genitourinary:  Negative for dysuria, hematuria, pelvic pain and vaginal discharge.  Skin:  Positive for rash.  All other systems reviewed and are negative.    Physical Exam Triage Vital Signs ED Triage Vitals [12/11/22 1259]  Enc Vitals Group     BP      Pulse Rate 68     Resp 18     Temp 98.6 F (37 C)     Temp src      SpO2 96 %     Weight      Height      Head Circumference      Peak Flow      Pain Score      Pain Loc      Pain Edu?      Excl. in [place]?    No data found.  Updated Vital Signs BP 137/88   Pulse 68   Temp 98.6 F (37 C)   Resp 18   SpO2 96%   Visual Acuity Right Eye Distance:   Left Eye Distance:   Bilateral Distance:    Right Eye Near:   Left Eye Near:    Bilateral Near:     Physical Exam Vitals and nursing note reviewed. Exam conducted with a chaperone present Joellen Jersey, Therapist, sports).  Constitutional:      General: She is not in acute distress.    Appearance: She is well-developed. She is not ill-appearing.  HENT:     Mouth/Throat:     Mouth: Mucous membranes are moist.  Cardiovascular:     Rate and Rhythm: Normal rate and regular rhythm.     Heart sounds: Normal heart sounds.  Pulmonary:     Effort: Pulmonary effort is normal. No respiratory distress.     Breath sounds: Normal breath sounds.  Genitourinary:    Comments: Erythematous rash on perineum and inner thighs.  No open wounds or drainage.   Musculoskeletal:        General: No swelling.     Cervical back: Neck supple.  Skin:    General: Skin is warm and dry.     Findings: No rash.  Neurological:     Mental Status: She is alert.  Psychiatric:        Mood and Affect: Mood normal.  Behavior: Behavior normal.      UC Treatments / Results  Labs (all labs ordered are listed, but only abnormal results are displayed) Labs Reviewed - No data to display  EKG   Radiology No results  found.  Procedures Procedures (including critical care time)  Medications Ordered in UC Medications - No data to display  Initial Impression / Assessment and Plan / UC Course  I have reviewed the triage vital signs and the nursing notes.  Pertinent labs & imaging results that were available during my care of the patient were reviewed by me and considered in my medical decision making (see chart for details).    Candidal dermatitis.  Treating with Nystatin cream.  Education provided on skin yeast infection.  Instructed patient to follow up with her PCP if her symptoms are not improving.  Patient and her granddaughter agree to plan of care.    Final Clinical Impressions(s) / UC Diagnoses   Final diagnoses:  Candidal dermatitis     Discharge Instructions      Use the Nystatin cream as directed.  Follow up with your primary care provider if your symptoms are not improving.        ED Prescriptions     Medication Sig Dispense Auth. Provider   nystatin cream (MYCOSTATIN) Apply to affected area 2 times daily 30 g Sharion Balloon, NP      PDMP not reviewed this encounter.   Sharion Balloon, NP 12/11/22 1329

## 2022-12-21 ENCOUNTER — Ambulatory Visit: Payer: Medicare Other

## 2022-12-21 ENCOUNTER — Encounter: Payer: Self-pay | Admitting: Internal Medicine

## 2022-12-21 ENCOUNTER — Inpatient Hospital Stay: Payer: Medicare Other

## 2022-12-21 ENCOUNTER — Inpatient Hospital Stay: Payer: Medicare Other | Attending: Internal Medicine | Admitting: Internal Medicine

## 2022-12-21 VITALS — BP 111/62 | HR 84 | Temp 97.6°F | Resp 15 | Wt 120.0 lb

## 2022-12-21 DIAGNOSIS — Z8616 Personal history of COVID-19: Secondary | ICD-10-CM | POA: Diagnosis not present

## 2022-12-21 DIAGNOSIS — Z780 Asymptomatic menopausal state: Secondary | ICD-10-CM | POA: Diagnosis not present

## 2022-12-21 DIAGNOSIS — Z79811 Long term (current) use of aromatase inhibitors: Secondary | ICD-10-CM | POA: Diagnosis not present

## 2022-12-21 DIAGNOSIS — Z87440 Personal history of urinary (tract) infections: Secondary | ICD-10-CM | POA: Insufficient documentation

## 2022-12-21 DIAGNOSIS — N183 Chronic kidney disease, stage 3 unspecified: Secondary | ICD-10-CM | POA: Insufficient documentation

## 2022-12-21 DIAGNOSIS — Z7901 Long term (current) use of anticoagulants: Secondary | ICD-10-CM | POA: Insufficient documentation

## 2022-12-21 DIAGNOSIS — I4891 Unspecified atrial fibrillation: Secondary | ICD-10-CM | POA: Insufficient documentation

## 2022-12-21 DIAGNOSIS — Z170 Estrogen receptor positive status [ER+]: Secondary | ICD-10-CM

## 2022-12-21 DIAGNOSIS — Z803 Family history of malignant neoplasm of breast: Secondary | ICD-10-CM | POA: Diagnosis not present

## 2022-12-21 DIAGNOSIS — C50512 Malignant neoplasm of lower-outer quadrant of left female breast: Secondary | ICD-10-CM | POA: Diagnosis present

## 2022-12-21 DIAGNOSIS — M810 Age-related osteoporosis without current pathological fracture: Secondary | ICD-10-CM | POA: Diagnosis not present

## 2022-12-21 LAB — CBC WITH DIFFERENTIAL/PLATELET
Abs Immature Granulocytes: 0.05 10*3/uL (ref 0.00–0.07)
Basophils Absolute: 0.1 10*3/uL (ref 0.0–0.1)
Basophils Relative: 1 %
Eosinophils Absolute: 0.1 10*3/uL (ref 0.0–0.5)
Eosinophils Relative: 2 %
HCT: 41.4 % (ref 36.0–46.0)
Hemoglobin: 13.3 g/dL (ref 12.0–15.0)
Immature Granulocytes: 1 %
Lymphocytes Relative: 20 %
Lymphs Abs: 1.7 10*3/uL (ref 0.7–4.0)
MCH: 31.5 pg (ref 26.0–34.0)
MCHC: 32.1 g/dL (ref 30.0–36.0)
MCV: 98.1 fL (ref 80.0–100.0)
Monocytes Absolute: 0.8 10*3/uL (ref 0.1–1.0)
Monocytes Relative: 10 %
Neutro Abs: 5.8 10*3/uL (ref 1.7–7.7)
Neutrophils Relative %: 66 %
Platelets: 180 10*3/uL (ref 150–400)
RBC: 4.22 MIL/uL (ref 3.87–5.11)
RDW: 15.6 % — ABNORMAL HIGH (ref 11.5–15.5)
WBC: 8.6 10*3/uL (ref 4.0–10.5)
nRBC: 0 % (ref 0.0–0.2)

## 2022-12-21 LAB — BASIC METABOLIC PANEL
Anion gap: 7 (ref 5–15)
BUN: 24 mg/dL — ABNORMAL HIGH (ref 8–23)
CO2: 28 mmol/L (ref 22–32)
Calcium: 8.9 mg/dL (ref 8.9–10.3)
Chloride: 104 mmol/L (ref 98–111)
Creatinine, Ser: 1 mg/dL (ref 0.44–1.00)
GFR, Estimated: 54 mL/min — ABNORMAL LOW (ref >60)
Glucose, Bld: 110 mg/dL — ABNORMAL HIGH (ref 70–99)
Potassium: 4.3 mmol/L (ref 3.5–5.1)
Sodium: 139 mmol/L (ref 135–145)

## 2022-12-21 MED ORDER — ANASTROZOLE 1 MG PO TABS: 1.0000 mg | ORAL_TABLET | Freq: Every day | ORAL | 3 refills | Status: DC

## 2022-12-21 NOTE — Assessment & Plan Note (Signed)
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  Not a candidate for adjuvant chemotherapy.  Currently on adjuvant anastrozole. Continue anastrozole [until JAN 2027].     # SEP 2023- PET scan-whole-body [MRI brain finding-incidental calvarial lesion]-negative for any metastatic disease.  DEC 2023-[Dr.Cintron]Likely benign dystrophic calcifications developing in the lumpectomy site in the lower outer left breast;   No mammographic evidence of malignancy in the bilateral breasts. Await  six-month follow-up diagnostic left breast mammogram.   # OSTEOPOROSIS: 2022-BMD measured at [facility] L1-L3 is 0.795 g/cm2 with a T-score of -3.2.  She declined Reclast; will hold off recast/patient preference.   #A. Fib-on Eliquis/amiodarone-stable  #History of UTIs- on prophylaxis Kelfex/doxycycline-[urology.]  Stable.  # CKD stage III- GFR-50s- stable.   I spoke at length with the patient's daughter, Neoma Laming regarding the patient's clinical status/plan of care.  Family agreement.   # DISPOSITION: # cancel reclast  # follow up in 6 months-; MD; labs- cbc/cmp-Dr.B

## 2022-12-21 NOTE — Progress Notes (Signed)
Patient is feeling good and no concerns today.

## 2022-12-21 NOTE — Progress Notes (Signed)
[facility] [place] NOTE  Patient Care Team: Idelle Crouch, MD as PCP - General (Internal Medicine) Cammie Sickle, MD as Consulting Physician (Internal Medicine) Herbert Pun, MD as Consulting Physician (General Surgery) Noreene Filbert, MD as Radiation Oncologist (Radiation Oncology)  CHIEF COMPLAINTS/PURPOSE OF CONSULTATION: Breast cancer  #  Oncology History Overview Note  #Right breast cancer [2006; Dr.Choksi]-s/p lumpectomy followed by radiation; ? Endocrine therapy.   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  # JAN 25th, 2022-start anastrozole  #2016 bone density osteoporosis  # A.fib- on eliuiqs [Dr.callwood]; frequent UTIs [[facility] ]; COVID in dec 2021.   #Genetics: Patient is interested in genetic blood draw discussed with genetic counselor-negative for any deleterious mutations.  # SURVIVORSHIP:   # GENETICS:   DIAGNOSIS: left breast cancer  STAGE:    I     ;  GOALS: cure  CURRENT/MOST RECENT THERAPY : RT    Carcinoma of lower-outer quadrant of left breast in female, estrogen receptor positive ([place])  07/23/2020 Initial Diagnosis   Carcinoma of lower-outer quadrant of left breast in female, estrogen receptor negative ([place])    Genetic Testing   Negative genetic testing. No pathogenic variants identified on the [facility] Multi-Cancer Panel +RNA. The report date is 04/24/2021.   The Multi-Cancer Panel + RNA offered by [facility] includes sequencing and/or deletion duplication testing of the following 84 genes: AIP, ALK, APC, ATM, AXIN2,BAP1,  BARD1, BLM, BMPR1A, BRCA1, BRCA2, BRIP1, CASR, CDC73, CDH1, CDK4, CDKN1B, CDKN1C, CDKN2A (p14ARF), CDKN2A (p16INK4a), CEBPA, CHEK2, CTNNA1, DICER1, DIS3L2, EGFR (c.2369C>T, p.Thr790Met variant only), EPCAM (Deletion/duplication testing only), FH, FLCN,  GATA2, GPC3, GREM1 (Promoter region deletion/duplication testing only), HOXB13 (c.251G>A, p.Gly84Glu), HRAS, KIT, MAX, MEN1, MET, MITF (c.952G>A, p.Glu318Lys variant only), MLH1, MSH2, MSH3, MSH6, MUTYH, NBN, NF1, NF2, NTHL1, PALB2, PDGFRA, PHOX2B, PMS2, POLD1, POLE, POT1, PRKAR1A, PTCH1, PTEN, RAD50, RAD51C, RAD51D, RB1, RECQL4, RET, RUNX1, SDHAF2, SDHA (sequence changes only), SDHB, SDHC, SDHD, SMAD4, SMARCA4, SMARCB1, SMARCE1, STK11, SUFU, TERC, TERT, TMEM127, TP53, TSC1, TSC2, VHL, WRN and WT1.    HISTORY OF PRESENTING ILLNESS: Patient is accompanied by her care giver.  Walks independently.  Sheryl Michael 89 y.o.  female patient with left-sided breast cancer stage I LOBULAR CANCER-ER-positive; PR- NEG; Her-2- POSITIVE; and Hx of stroke- on Xarelto is here for a follow up.   No episode of stroke or falls.  No blood in stools or black or stools.  She continues to take anastrozole.  Denies any joint pains nausea vomiting headaches.  Denies any worsening hot flashes.  Review of Systems  Constitutional:  Negative for chills, diaphoresis, fever, malaise/fatigue and weight loss.  HENT:  Negative for nosebleeds and sore throat.   Eyes:  Negative for double vision.  Respiratory:  Negative for cough, hemoptysis, sputum production, shortness of breath and wheezing.   Cardiovascular:  Negative for chest pain, palpitations, orthopnea and leg swelling.  Gastrointestinal:  Negative for abdominal pain, blood in stool, constipation, diarrhea, heartburn, melena, nausea and vomiting.  Genitourinary:  Negative for dysuria, frequency and urgency.  Musculoskeletal:  Positive for back pain and joint pain.  Skin: Negative.  Negative for itching and rash.  Neurological:  Negative for dizziness, tingling, focal weakness, weakness and headaches.  Endo/Heme/Allergies:  Does not bruise/bleed easily.  Psychiatric/Behavioral:  Negative for depression. The patient is not nervous/anxious and does not have insomnia.  MEDICAL HISTORY:  Past Medical History:  Diagnosis Date   Arthritis    Atrial fibrillation ([place])    Breast cancer ([place]) 2006   right breast ca with lumpectomy and rad tx, left breast ca 2021lumpectomy and rad tx   Colon polyp    Complication of anesthesia    questions about husband who passed in 2012    Cystocele    Depression    Dysrhythmia    Family history of breast cancer    Female bladder prolapse    GERD (gastroesophageal reflux disease)    Goiter    Hyperlipemia    Hypertension    Hypothyroidism    Osteoporosis    Personal history of radiation therapy 2006   right breast ca and left breast 2021   Pneumonia    Procidentia of uterus    Recurrent UTI    Reflux    TIA (transient ischemic attack)    Vaginal atrophy     SURGICAL HISTORY: Past Surgical History:  Procedure Laterality Date   APPENDECTOMY     BREAST BIOPSY Right 2006   breast cancer   BREAST BIOPSY Left 07/17/2020   [place] bx, vision marker, IMC with lobular features   BREAST LUMPECTOMY Right 2006   positive   BREAST LUMPECTOMY Left 08/03/2020   left breast invasive lobular carcinoma, negative LNs   BREAST SURGERY Right    lumpectomy   CATARACT EXTRACTION W/ INTRAOCULAR LENS  IMPLANT, BILATERAL     COLONOSCOPY     CYSTOCELE REPAIR N/A 12/21/2015   Procedure: ANTERIOR REPAIR (CYSTOCELE);  Surgeon: Brayton Mars, MD;  Location: [facility];  Service: Gynecology;  Laterality: N/A;   DILATION AND CURETTAGE OF UTERUS     ELECTROMAGNETIC NAVIGATION BROCHOSCOPY Right 12/13/2018   Procedure: ELECTROMAGNETIC NAVIGATION BRONCHOSCOPY;  Surgeon: Flora Lipps, MD;  Location: [facility];  Service: Cardiopulmonary;  Laterality: Right;   EYE SURGERY     PARTIAL MASTECTOMY WITH NEEDLE LOCALIZATION AND AXILLARY SENTINEL LYMPH NODE BX Left 08/03/2020   Procedure: PARTIAL MASTECTOMY WITH Radio Frequency tag AND AXILLARY SENTINEL LYMPH NODE BX;  Surgeon: Herbert Pun, MD;  Location: [facility];  Service:  General;  Laterality: Left;   VAGINAL HYSTERECTOMY Bilateral 12/21/2015   Procedure: TVH BSO;  Surgeon: Brayton Mars, MD;  Location: [facility];  Service: Gynecology;  Laterality: Bilateral;    SOCIAL HISTORY: Social History   Socioeconomic History   Marital status: Widowed    Spouse name: Not on file   Number of children: Not on file   Years of education: Not on file   Highest education level: Not on file  Occupational History   Not on file  Tobacco Use   Smoking status: Never   Smokeless tobacco: Never  Vaping Use   Vaping Use: Never used  Substance and Sexual Activity   Alcohol use: No   Drug use: No   Sexual activity: Never    Birth control/protection: Post-menopausal  Other Topics Concern   Not on file  Social History Narrative   Not on file   Social Determinants of Health   Financial Resource Strain: Not on file  Food Insecurity: Not on file  Transportation Needs: Not on file  Physical Activity: Not on file  Stress: Not on file  Social Connections: Not on file  Intimate Partner Violence: Not on file    FAMILY HISTORY: Family History  Problem Relation Age of Onset   Diabetes Sister    Breast cancer Sister  late 40's   Diabetes Brother    Diabetes Sister     ALLERGIES:  is allergic to bactrim [sulfamethoxazole-trimethoprim], iodinated contrast media, codeine, erythromycin ethylsuccinate, phenobarbital, ciprofloxacin, and penicillins.  MEDICATIONS:  Current Outpatient Medications  Medication Sig Dispense Refill   acetaminophen (TYLENOL) 325 MG tablet Take 650 mg by mouth every 6 (six) hours as needed (pain.).      albuterol (VENTOLIN HFA) 108 (90 Base) MCG/ACT inhaler Inhale 2 puffs into the lungs every 6 (six) hours as needed for wheezing or shortness of breath. 8 g 2   atorvastatin (LIPITOR) 40 MG tablet Take by mouth.     Calcium Carb-Cholecalciferol 600-20 MG-MCG TABS 1 tablet with a meal Orally Once a day     Cholecalciferol (VITAMIN  D3) 10 MCG (400 UNIT) tablet Take 400 Units by mouth daily.     escitalopram (LEXAPRO) 10 MG tablet Take 10 mg by mouth daily.      Lactobacillus (PROBIOTIC ACIDOPHILUS) CAPS Take 1 capsule by mouth in the morning and at bedtime.     levothyroxine (SYNTHROID, LEVOTHROID) 50 MCG tablet Take 50 mcg by mouth daily before breakfast.     nystatin cream (MYCOSTATIN) Apply to affected area 2 times daily 30 g 2   omeprazole (PRILOSEC) 20 MG capsule Take 20 mg by mouth daily.     tobramycin-dexamethasone (TOBRADEX) ophthalmic solution SMARTSIG:In Eye(s)     anastrozole (ARIMIDEX) 1 MG tablet Take 1 tablet (1 mg total) by mouth daily. 90 tablet 3   apixaban (ELIQUIS) 2.5 MG TABS tablet Take 1 tablet (2.5 mg total) by mouth 2 (two) times daily. (Patient not taking: Reported on 06/22/2022) 60 tablet 0   Calcium Carbonate-Vitamin D 600-200 MG-UNIT TABS Take 1 tablet by mouth daily.  (Patient not taking: Reported on 02/02/2022)     cephALEXin (KEFLEX) 250 MG capsule Take 1 capsule (250 mg total) by mouth daily. (Patient not taking: Reported on 02/02/2022) 90 capsule 3   cyanocobalamin (,VITAMIN B-12,) 1000 MCG/ML injection Inject 1,000 mcg into the muscle every 28 (twenty-eight) days. (Patient not taking: Reported on 12/11/2022)     diltiazem (CARDIZEM CD) 120 MG 24 hr capsule Take 1 capsule (120 mg total) by mouth daily. (Patient not taking: Reported on 12/21/2022) 30 capsule 0   feeding supplement, ENSURE ENLIVE, (ENSURE ENLIVE) LIQD Take 237 mLs by mouth 2 (two) times daily between meals. (Patient not taking: Reported on 08/04/2021) 237 mL 12   metroNIDAZOLE (FLAGYL) 500 MG tablet Take 500 mg by mouth 3 (three) times daily. (Patient not taking: Reported on 12/21/2022)     predniSONE (STERAPRED UNI-PAK 21 TAB) 10 MG (21) TBPK tablet As directed on packaging (Patient not taking: Reported on 06/22/2022) 1 each 0   Rivaroxaban (XARELTO) 15 MG TABS tablet Take by mouth.     Spacer/Aero-Holding Chambers (AEROCHAMBER MV)  inhaler Use as instructed (Patient not taking: Reported on 06/22/2022) 1 each 0   No current facility-administered medications for this visit.      Marland Kitchen  PHYSICAL EXAMINATION: ECOG PERFORMANCE STATUS: 0 - Asymptomatic  Vitals:   12/21/22 1356  BP: 111/62  Pulse: 84  Resp: 15  Temp: 97.6 F (36.4 C)  SpO2: 100%   Filed Weights   12/21/22 1356  Weight: 120 lb (54.4 kg)    Physical Exam HENT:     Head: Normocephalic and atraumatic.     Mouth/Throat:     Pharynx: No oropharyngeal exudate.  Eyes:     Pupils: Pupils are equal, round, and  reactive to light.  Cardiovascular:     Rate and Rhythm: Normal rate and regular rhythm.  Pulmonary:     Effort: Pulmonary effort is normal. No respiratory distress.     Breath sounds: No wheezing.  Abdominal:     General: Bowel sounds are normal. There is no distension.     Palpations: Abdomen is soft. There is no mass.     Tenderness: There is no abdominal tenderness. There is no guarding or rebound.  Musculoskeletal:        General: No tenderness. Normal range of motion.     Cervical back: Normal range of motion and neck supple.  Skin:    General: Skin is warm.  Neurological:     Mental Status: She is alert and oriented to person, place, and time.  Psychiatric:        Mood and Affect: Affect normal.      LABORATORY DATA:  I have reviewed the data as listed Lab Results  Component Value Date   WBC 8.6 12/21/2022   HGB 13.3 12/21/2022   HCT 41.4 12/21/2022   MCV 98.1 12/21/2022   PLT 180 12/21/2022   Recent Labs    02/02/22 1311 05/16/22 1212 06/22/22 1436 12/21/22 1337  NA 140 139 138 139  K 4.8 4.0 3.6 4.3  CL 104 107 105 104  CO2 '30 24 27 28  '$ GLUCOSE 100* 86 132* 110*  BUN 21 24* 15 24*  CREATININE 1.11* 1.05* 1.04* 1.00  CALCIUM 9.1 8.6* 8.8* 8.9  GFRNONAA 48* 51* 51* 54*  PROT 6.7  --  6.6  --   ALBUMIN 3.4*  --  3.3*  --   AST 15  --  15  --   ALT 10  --  10  --   ALKPHOS 81  --  78  --   BILITOT 0.4  --   0.3  --     RADIOGRAPHIC STUDIES: I have personally reviewed the radiological images as listed and agreed with the findings in the report. CT ABDOMEN PELVIS WO CONTRAST  Result Date: 12/04/2022 CLINICAL DATA:  Abdominal pain.  History of breast cancer. EXAM: CT ABDOMEN AND PELVIS WITHOUT CONTRAST TECHNIQUE: Multidetector CT imaging of the abdomen and pelvis was performed following the standard protocol without IV contrast. RADIATION DOSE REDUCTION: This exam was performed according to the departmental dose-optimization program which includes automated exposure control, adjustment of the mA and/or kV according to patient size and/or use of iterative reconstruction technique. COMPARISON:  CT abdomen pelvis dated 03/05/2020. FINDINGS: Evaluation of this exam is limited in the absence of intravenous contrast. Lower chest: The visualized lung bases are clear. No intra-abdominal free air or free fluid. Hepatobiliary: The liver is unremarkable. No biliary dilatation. The gallbladder is unremarkable. Pancreas: Unremarkable. No pancreatic ductal dilatation or surrounding inflammatory changes. Spleen: Normal in size without focal abnormality. Adrenals/Urinary Tract: The adrenal glands unremarkable. There is no hydronephrosis or nephrolithiasis on either side. Faint left renal interpolar hypodense lesion is not characterized on this CT but appears to have been present on the prior CT, likely a cyst. This can be better evaluated with ultrasound on a nonemergent/outpatient basis. The visualized ureters and urinary bladder appear unremarkable. Stomach/Bowel: There is moderate stool throughout the colon. There is sigmoid diverticulosis and scattered colonic diverticula without active inflammatory changes. There is no bowel obstruction or active inflammation. There is a small hiatal hernia. The appendix is not visualized with certainty. No inflammatory changes identified in the right lower  quadrant. Vascular/Lymphatic:  Moderate aortoiliac atherosclerotic disease. The IVC is unremarkable. No portal venous gas. There is no adenopathy. Reproductive: Hysterectomy.  No adnexal masses. Other: None Musculoskeletal: Osteopenia with degenerative changes of the spine. No acute osseous pathology. IMPRESSION: 1. No acute intra-abdominal or pelvic pathology. 2. Colonic diverticulosis. No bowel obstruction. 3. No hydronephrosis or nephrolithiasis. 4.  Aortic Atherosclerosis (ICD10-I70.0). Electronically Signed   By: Anner Crete M.D.   On: 12/04/2022 19:38    ASSESSMENT & PLAN:   Carcinoma of lower-outer quadrant of left breast in female, estrogen receptor positive ([place]) # LEFT BREAST INVASIVE MAMMARY CA with  LOBULAR CANCER-ER-POSITIVE; PR- NEG; Her-2- POSITIVE.  Not a candidate for adjuvant chemotherapy.  Currently on adjuvant anastrozole. Continue anastrozole [until JAN 2027].     # SEP 2023- PET scan-whole-body [MRI brain finding-incidental calvarial lesion]-negative for any metastatic disease.  DEC 2023-[Dr.Cintron]Likely benign dystrophic calcifications developing in the lumpectomy site in the lower outer left breast;   No mammographic evidence of malignancy in the bilateral breasts. Await  six-month follow-up diagnostic left breast mammogram.   # OSTEOPOROSIS: 2022-BMD measured at [facility] Spine L1-L3 is 0.795 g/cm2 with a T-score of -3.2.  She declined Reclast; will hold off recast/patient preference.   #A. Fib-on Eliquis/amiodarone-stable  #History of UTIs- on prophylaxis Kelfex/doxycycline-[urology.]  Stable.  # CKD stage III- GFR-50s- stable.   I spoke at length with the patient's daughter, Neoma Laming regarding the patient's clinical status/plan of care.  Family agreement.   # DISPOSITION: # cancel reclast  # follow up in 6 months-; MD; labs- cbc/cmp-Dr.B  All questions were answered. The patient/family knows to call the clinic with any problems, questions or concerns.    Cammie Sickle, MD 12/21/2022 2:31  PM

## 2023-02-16 ENCOUNTER — Other Ambulatory Visit: Payer: Self-pay | Admitting: General Surgery

## 2023-02-16 DIAGNOSIS — Z853 Personal history of malignant neoplasm of breast: Secondary | ICD-10-CM

## 2023-03-17 ENCOUNTER — Inpatient Hospital Stay: Payer: Medicare Other

## 2023-03-17 ENCOUNTER — Other Ambulatory Visit: Payer: Self-pay

## 2023-03-17 ENCOUNTER — Emergency Department: Payer: Medicare Other

## 2023-03-17 ENCOUNTER — Inpatient Hospital Stay
Admission: EM | Admit: 2023-03-17 | Discharge: 2023-04-05 | DRG: 456 | Disposition: A | Discharge status: Skilled Nursing Facility | Payer: Medicare Other | Attending: Internal Medicine | Admitting: Internal Medicine

## 2023-03-17 ENCOUNTER — Encounter: Payer: Self-pay | Admitting: Emergency Medicine

## 2023-03-17 DIAGNOSIS — E039 Hypothyroidism, unspecified: Secondary | ICD-10-CM | POA: Diagnosis present

## 2023-03-17 DIAGNOSIS — M545 Low back pain, unspecified: Secondary | ICD-10-CM

## 2023-03-17 DIAGNOSIS — U071 COVID-19: Secondary | ICD-10-CM | POA: Diagnosis not present

## 2023-03-17 DIAGNOSIS — Z8673 Personal history of transient ischemic attack (TIA), and cerebral infarction without residual deficits: Secondary | ICD-10-CM | POA: Diagnosis not present

## 2023-03-17 DIAGNOSIS — C50919 Malignant neoplasm of unspecified site of unspecified female breast: Secondary | ICD-10-CM | POA: Diagnosis present

## 2023-03-17 DIAGNOSIS — I4891 Unspecified atrial fibrillation: Secondary | ICD-10-CM | POA: Diagnosis present

## 2023-03-17 DIAGNOSIS — E785 Hyperlipidemia, unspecified: Secondary | ICD-10-CM | POA: Diagnosis present

## 2023-03-17 DIAGNOSIS — Z7952 Long term (current) use of systemic steroids: Secondary | ICD-10-CM

## 2023-03-17 DIAGNOSIS — K219 Gastro-esophageal reflux disease without esophagitis: Secondary | ICD-10-CM | POA: Diagnosis present

## 2023-03-17 DIAGNOSIS — Z751 Person awaiting admission to adequate facility elsewhere: Secondary | ICD-10-CM

## 2023-03-17 DIAGNOSIS — S22089A Unspecified fracture of T11-T12 vertebra, initial encounter for closed fracture: Secondary | ICD-10-CM | POA: Diagnosis not present

## 2023-03-17 DIAGNOSIS — I4510 Unspecified right bundle-branch block: Secondary | ICD-10-CM | POA: Diagnosis present

## 2023-03-17 DIAGNOSIS — Z881 Allergy status to other antibiotic agents status: Secondary | ICD-10-CM | POA: Diagnosis not present

## 2023-03-17 DIAGNOSIS — S22089D Unspecified fracture of T11-T12 vertebra, subsequent encounter for fracture with routine healing: Secondary | ICD-10-CM | POA: Diagnosis not present

## 2023-03-17 DIAGNOSIS — S22008A Other fracture of unspecified thoracic vertebra, initial encounter for closed fracture: Secondary | ICD-10-CM | POA: Diagnosis present

## 2023-03-17 DIAGNOSIS — M8008XA Age-related osteoporosis with current pathological fracture, vertebra(e), initial encounter for fracture: Principal | ICD-10-CM | POA: Diagnosis present

## 2023-03-17 DIAGNOSIS — I10 Essential (primary) hypertension: Secondary | ICD-10-CM | POA: Diagnosis present

## 2023-03-17 DIAGNOSIS — Z803 Family history of malignant neoplasm of breast: Secondary | ICD-10-CM

## 2023-03-17 DIAGNOSIS — Z923 Personal history of irradiation: Secondary | ICD-10-CM | POA: Diagnosis not present

## 2023-03-17 DIAGNOSIS — Z170 Estrogen receptor positive status [ER+]: Secondary | ICD-10-CM | POA: Diagnosis not present

## 2023-03-17 DIAGNOSIS — Z7901 Long term (current) use of anticoagulants: Secondary | ICD-10-CM | POA: Diagnosis not present

## 2023-03-17 DIAGNOSIS — S22009A Unspecified fracture of unspecified thoracic vertebra, initial encounter for closed fracture: Secondary | ICD-10-CM | POA: Diagnosis present

## 2023-03-17 DIAGNOSIS — M40204 Unspecified kyphosis, thoracic region: Secondary | ICD-10-CM | POA: Diagnosis present

## 2023-03-17 DIAGNOSIS — M532X4 Spinal instabilities, thoracic region: Secondary | ICD-10-CM | POA: Diagnosis present

## 2023-03-17 DIAGNOSIS — Z79811 Long term (current) use of aromatase inhibitors: Secondary | ICD-10-CM | POA: Diagnosis not present

## 2023-03-17 DIAGNOSIS — M1990 Unspecified osteoarthritis, unspecified site: Secondary | ICD-10-CM | POA: Diagnosis present

## 2023-03-17 DIAGNOSIS — Z91041 Radiographic dye allergy status: Secondary | ICD-10-CM

## 2023-03-17 DIAGNOSIS — R52 Pain, unspecified: Secondary | ICD-10-CM | POA: Diagnosis not present

## 2023-03-17 DIAGNOSIS — R64 Cachexia: Secondary | ICD-10-CM | POA: Diagnosis present

## 2023-03-17 DIAGNOSIS — F0390 Unspecified dementia without behavioral disturbance: Secondary | ICD-10-CM | POA: Diagnosis present

## 2023-03-17 DIAGNOSIS — F32A Depression, unspecified: Secondary | ICD-10-CM | POA: Diagnosis present

## 2023-03-17 DIAGNOSIS — Z79899 Other long term (current) drug therapy: Secondary | ICD-10-CM | POA: Diagnosis not present

## 2023-03-17 DIAGNOSIS — Z79890 Hormone replacement therapy (postmenopausal): Secondary | ICD-10-CM | POA: Diagnosis not present

## 2023-03-17 DIAGNOSIS — E876 Hypokalemia: Secondary | ICD-10-CM | POA: Diagnosis not present

## 2023-03-17 DIAGNOSIS — Z853 Personal history of malignant neoplasm of breast: Secondary | ICD-10-CM | POA: Diagnosis not present

## 2023-03-17 DIAGNOSIS — M40294 Other kyphosis, thoracic region: Secondary | ICD-10-CM | POA: Diagnosis not present

## 2023-03-17 DIAGNOSIS — M4014 Other secondary kyphosis, thoracic region: Secondary | ICD-10-CM | POA: Diagnosis not present

## 2023-03-17 DIAGNOSIS — Z9012 Acquired absence of left breast and nipple: Secondary | ICD-10-CM

## 2023-03-17 DIAGNOSIS — I480 Paroxysmal atrial fibrillation: Secondary | ICD-10-CM | POA: Diagnosis present

## 2023-03-17 DIAGNOSIS — Z833 Family history of diabetes mellitus: Secondary | ICD-10-CM

## 2023-03-17 DIAGNOSIS — Z888 Allergy status to other drugs, medicaments and biological substances status: Secondary | ICD-10-CM

## 2023-03-17 DIAGNOSIS — Z880 Allergy status to penicillin: Secondary | ICD-10-CM | POA: Diagnosis not present

## 2023-03-17 DIAGNOSIS — S32019A Unspecified fracture of first lumbar vertebra, initial encounter for closed fracture: Secondary | ICD-10-CM | POA: Diagnosis not present

## 2023-03-17 DIAGNOSIS — W19XXXA Unspecified fall, initial encounter: Secondary | ICD-10-CM | POA: Diagnosis not present

## 2023-03-17 DIAGNOSIS — M40209 Unspecified kyphosis, site unspecified: Secondary | ICD-10-CM

## 2023-03-17 DIAGNOSIS — M549 Dorsalgia, unspecified: Secondary | ICD-10-CM | POA: Diagnosis present

## 2023-03-17 DIAGNOSIS — Z885 Allergy status to narcotic agent status: Secondary | ICD-10-CM

## 2023-03-17 DIAGNOSIS — S22081D Stable burst fracture of T11-T12 vertebra, subsequent encounter for fracture with routine healing: Secondary | ICD-10-CM | POA: Diagnosis not present

## 2023-03-17 DIAGNOSIS — Z6822 Body mass index (BMI) 22.0-22.9, adult: Secondary | ICD-10-CM

## 2023-03-17 DIAGNOSIS — C50512 Malignant neoplasm of lower-outer quadrant of left female breast: Secondary | ICD-10-CM | POA: Diagnosis not present

## 2023-03-17 DIAGNOSIS — M810 Age-related osteoporosis without current pathological fracture: Secondary | ICD-10-CM | POA: Diagnosis present

## 2023-03-17 DIAGNOSIS — T8130XA Disruption of wound, unspecified, initial encounter: Secondary | ICD-10-CM | POA: Diagnosis not present

## 2023-03-17 DIAGNOSIS — E782 Mixed hyperlipidemia: Secondary | ICD-10-CM | POA: Diagnosis not present

## 2023-03-17 LAB — COMPREHENSIVE METABOLIC PANEL
ALT: 19 U/L (ref 0–44)
AST: 25 U/L (ref 15–41)
Albumin: 3.1 g/dL — ABNORMAL LOW (ref 3.5–5.0)
Alkaline Phosphatase: 87 U/L (ref 38–126)
Anion gap: 10 (ref 5–15)
BUN: 19 mg/dL (ref 8–23)
CO2: 24 mmol/L (ref 22–32)
Calcium: 8.9 mg/dL (ref 8.9–10.3)
Chloride: 107 mmol/L (ref 98–111)
Creatinine, Ser: 0.91 mg/dL (ref 0.44–1.00)
GFR, Estimated: >60 mL/min (ref >60)
Glucose, Bld: 108 mg/dL — ABNORMAL HIGH (ref 70–99)
Potassium: 4 mmol/L (ref 3.5–5.1)
Sodium: 141 mmol/L (ref 135–145)
Total Bilirubin: 1 mg/dL (ref 0.3–1.2)
Total Protein: 6.3 g/dL — ABNORMAL LOW (ref 6.5–8.1)

## 2023-03-17 LAB — CBC WITH DIFFERENTIAL/PLATELET
Abs Immature Granulocytes: 0.04 10*3/uL (ref 0.00–0.07)
Basophils Absolute: 0 10*3/uL (ref 0.0–0.1)
Basophils Relative: 0 %
Eosinophils Absolute: 0 10*3/uL (ref 0.0–0.5)
Eosinophils Relative: 0 %
HCT: 42.2 % (ref 36.0–46.0)
Hemoglobin: 13.8 g/dL (ref 12.0–15.0)
Immature Granulocytes: 1 %
Lymphocytes Relative: 8 %
Lymphs Abs: 0.7 10*3/uL (ref 0.7–4.0)
MCH: 31.9 pg (ref 26.0–34.0)
MCHC: 32.7 g/dL (ref 30.0–36.0)
MCV: 97.5 fL (ref 80.0–100.0)
Monocytes Absolute: 0.5 10*3/uL (ref 0.1–1.0)
Monocytes Relative: 6 %
Neutro Abs: 7.4 10*3/uL (ref 1.7–7.7)
Neutrophils Relative %: 85 %
Platelets: 153 10*3/uL (ref 150–400)
RBC: 4.33 MIL/uL (ref 3.87–5.11)
RDW: 14.7 % (ref 11.5–15.5)
WBC: 8.7 10*3/uL (ref 4.0–10.5)
nRBC: 0 % (ref 0.0–0.2)

## 2023-03-17 LAB — TROPONIN I (HIGH SENSITIVITY): Troponin I (High Sensitivity): 7 ng/L (ref <18)

## 2023-03-17 LAB — LACTIC ACID, PLASMA: Lactic Acid, Venous: 1.7 mmol/L (ref 0.5–1.9)

## 2023-03-17 MED ORDER — ATORVASTATIN CALCIUM 20 MG PO TABS
40.0000 mg | ORAL_TABLET | Freq: Every day | ORAL | Status: DC
  Administered 2023-03-18 – 2023-04-05 (×18): 40 mg via ORAL
  Filled 2023-03-17 (×19): qty 2

## 2023-03-17 MED ORDER — ESCITALOPRAM OXALATE 10 MG PO TABS
10.0000 mg | ORAL_TABLET | Freq: Every day | ORAL | Status: DC
  Administered 2023-03-18 – 2023-04-05 (×18): 10 mg via ORAL
  Filled 2023-03-17 (×20): qty 1

## 2023-03-17 MED ORDER — ACETAMINOPHEN 325 MG PO TABS
650.0000 mg | ORAL_TABLET | Freq: Four times a day (QID) | ORAL | Status: DC | PRN
  Administered 2023-03-18 (×2): 650 mg via ORAL
  Filled 2023-03-17 (×2): qty 2

## 2023-03-17 MED ORDER — RIVAROXABAN 15 MG PO TABS
15.0000 mg | ORAL_TABLET | Freq: Every day | ORAL | Status: DC
  Filled 2023-03-17: qty 1

## 2023-03-17 MED ORDER — ANASTROZOLE 1 MG PO TABS
1.0000 mg | ORAL_TABLET | Freq: Every day | ORAL | Status: DC
  Administered 2023-03-18 – 2023-04-05 (×18): 1 mg via ORAL
  Filled 2023-03-17 (×21): qty 1

## 2023-03-17 MED ORDER — VITAMIN D 25 MCG (1000 UNIT) PO TABS
1000.0000 [IU] | ORAL_TABLET | Freq: Every day | ORAL | Status: DC
  Administered 2023-03-18 – 2023-04-05 (×18): 1000 [IU] via ORAL
  Filled 2023-03-17 (×19): qty 1

## 2023-03-17 MED ORDER — ENOXAPARIN SODIUM 40 MG/0.4ML IJ SOSY: 40.0000 mg | PREFILLED_SYRINGE | INTRAMUSCULAR | Status: DC

## 2023-03-17 MED ORDER — FENTANYL CITRATE PF 50 MCG/ML IJ SOSY
25.0000 ug | PREFILLED_SYRINGE | Freq: Once | INTRAMUSCULAR | Status: AC
  Administered 2023-03-17: 25 ug via INTRAVENOUS
  Filled 2023-03-17: qty 1

## 2023-03-17 MED ORDER — RISAQUAD PO CAPS
1.0000 | ORAL_CAPSULE | Freq: Two times a day (BID) | ORAL | Status: DC
  Administered 2023-03-18 – 2023-04-05 (×34): 1 via ORAL
  Filled 2023-03-17 (×35): qty 1

## 2023-03-17 MED ORDER — MEMANTINE HCL 10 MG PO TABS
5.0000 mg | ORAL_TABLET | Freq: Two times a day (BID) | ORAL | Status: DC
  Administered 2023-03-18 – 2023-04-05 (×36): 5 mg via ORAL
  Filled 2023-03-17 (×37): qty 1

## 2023-03-17 MED ORDER — POLYETHYLENE GLYCOL 3350 17 G PO PACK
17.0000 g | PACK | Freq: Every day | ORAL | Status: DC | PRN
  Administered 2023-03-21 – 2023-03-27 (×2): 17 g via ORAL
  Filled 2023-03-17 (×2): qty 1

## 2023-03-17 MED ORDER — SODIUM CHLORIDE 0.9 % IV SOLN: INTRAVENOUS | Status: DC | PRN

## 2023-03-17 MED ORDER — ALBUTEROL SULFATE (2.5 MG/3ML) 0.083% IN NEBU
2.5000 mg | INHALATION_SOLUTION | Freq: Four times a day (QID) | RESPIRATORY_TRACT | Status: DC | PRN
  Administered 2023-03-18: 2.5 mg via RESPIRATORY_TRACT
  Filled 2023-03-17: qty 3

## 2023-03-17 MED ORDER — LEVOTHYROXINE SODIUM 50 MCG PO TABS
50.0000 ug | ORAL_TABLET | Freq: Every day | ORAL | Status: DC
  Administered 2023-03-18 – 2023-04-05 (×16): 50 ug via ORAL
  Filled 2023-03-17 (×17): qty 1

## 2023-03-17 MED ORDER — PANTOPRAZOLE SODIUM 40 MG PO TBEC
40.0000 mg | DELAYED_RELEASE_TABLET | Freq: Every day | ORAL | Status: DC
  Administered 2023-03-18 – 2023-04-05 (×18): 40 mg via ORAL
  Filled 2023-03-17 (×19): qty 1

## 2023-03-17 MED ORDER — DOCUSATE SODIUM 100 MG PO CAPS
100.0000 mg | ORAL_CAPSULE | Freq: Two times a day (BID) | ORAL | Status: DC
  Administered 2023-03-18 – 2023-04-05 (×31): 100 mg via ORAL
  Filled 2023-03-17 (×34): qty 1

## 2023-03-17 MED ORDER — DILTIAZEM HCL ER COATED BEADS 120 MG PO CP24
120.0000 mg | ORAL_CAPSULE | Freq: Every day | ORAL | Status: DC
  Administered 2023-03-18: 120 mg via ORAL
  Filled 2023-03-17 (×2): qty 1

## 2023-03-17 MED ORDER — ONDANSETRON HCL 4 MG/2ML IJ SOLN
4.0000 mg | Freq: Four times a day (QID) | INTRAMUSCULAR | Status: AC | PRN
  Administered 2023-03-17 – 2023-03-18 (×2): 4 mg via INTRAVENOUS
  Filled 2023-03-17 (×2): qty 2

## 2023-03-17 MED ORDER — ONDANSETRON HCL 4 MG PO TABS: 4.0000 mg | ORAL_TABLET | Freq: Four times a day (QID) | ORAL | Status: AC | PRN

## 2023-03-17 MED ORDER — ACETAMINOPHEN 650 MG RE SUPP: 650.0000 mg | Freq: Four times a day (QID) | RECTAL | Status: DC | PRN

## 2023-03-17 MED ORDER — MORPHINE SULFATE (PF) 4 MG/ML IV SOLN
4.0000 mg | INTRAVENOUS | Status: AC | PRN
  Administered 2023-03-17 – 2023-03-18 (×4): 4 mg via INTRAVENOUS
  Filled 2023-03-17 (×4): qty 1

## 2023-03-17 MED ORDER — FENTANYL CITRATE PF 50 MCG/ML IJ SOSY
25.0000 ug | PREFILLED_SYRINGE | INTRAMUSCULAR | Status: DC | PRN
  Administered 2023-03-17 – 2023-03-18 (×2): 25 ug via INTRAVENOUS
  Filled 2023-03-17 (×2): qty 1

## 2023-03-17 NOTE — Progress Notes (Addendum)
       CROSS COVER NOTE  NAME: Sheryl Michael MRN: [id number] DOB : 1931/04/23    Concern from nurse [name]    patient unable to take PO meds because of inability to tolerate sitting up at this time (even with pain med).     Pertinent findings on chart review: As per previous note  Assessment and  Interventions   Assessment: Patient unable to tolerate sitting up to safely swallow Plan: Hold oral meds oral feeds overnight IV hydration (was earlier requested by daughter [name]) IV pain control SLP eval in a.m.

## 2023-03-17 NOTE — ED Provider Notes (Signed)
[facility] Provider Note    Event Date/Time   First MD Initiated Contact with Patient 03/17/23 1324     (approximate)   History   Back Pain (/)   HPI  Sheryl Michael is a 89 y.o. female with a history of atrial fibrillation, hypertension, hyperlipidemia, hypothyroidism, TIA, and recurrent UTIs who presents with back pain, unclear initial onset.  The patient states that the pain is in the lower back across both sides.  She states it has been present for some time.  She denies any numbness or weakness.  She was brought to the ED today after being found on the floor at her facility, but the patient states that she believes that she laid down on the floor because she was feeling somewhat dizzy and because she thought it would help with the back pain.  She denies falling or injuring her back.  The patient denies any fever or chills, difficulty urinating, shortness of breath, vomiting or diarrhea.  I reviewed the past medical records per the patient's most recent outpatient encounters with orthopedics on 5/8 for rotator cuff tear and osteoarthritis of the right knee which were treated with steroid injections.   Physical Exam   Triage Vital Signs: ED Triage Vitals  Enc Vitals Group     BP 03/17/23 1311 (!) 125/59     Pulse Rate 03/17/23 1311 (!) 136     Resp 03/17/23 1311 16     Temp 03/17/23 1311 97.8 F (36.6 C)     Temp src --      SpO2 03/17/23 1311 96 %     Weight 03/17/23 1310 118 lb (53.5 kg)     Height 03/17/23 1310 5' (1.524 m)     Head Circumference --      Peak Flow --      Pain Score 03/17/23 1309 6     Pain Loc --      Pain Edu? --      Excl. in GC? --     Most recent vital signs: Vitals:   03/17/23 1311 03/17/23 1620  BP: (!) 125/59 (!) 143/75  Pulse: (!) 136 (!) 135  Resp: 16 20  Temp: 97.8 F (36.6 C)   SpO2: 96% 96%     General: Alert and oriented, no distress.  CV:  Good peripheral perfusion.  Resp:  Normal effort.   Abd:  Soft and not tender.  No distention.  Other:  Mild midline and paraspinal lumbar tenderness.  No step-off or crepitus.  5/5 motor strength and intact sensation of bilateral lower extremities.   ED Results / Procedures / Treatments   Labs (all labs ordered are listed, but only abnormal results are displayed) Labs Reviewed  COMPREHENSIVE METABOLIC PANEL - Abnormal; Notable for the following components:      Result Value   Glucose, Bld 108 (*)    Total Protein 6.3 (*)    Albumin 3.1 (*)    All other components within normal limits  LACTIC ACID, PLASMA  CBC WITH DIFFERENTIAL/PLATELET  URINALYSIS, ROUTINE W REFLEX MICROSCOPIC  LACTIC ACID, PLASMA  BASIC METABOLIC PANEL  CBC  TROPONIN I (HIGH SENSITIVITY)  TROPONIN I (HIGH SENSITIVITY)     EKG  ED ECG REPORT I, Dionne Bucy, the attending physician, personally viewed and interpreted this ECG.  Date: 03/17/2023 EKG Time: 1518 Rate: 138 Rhythm: Atrial fibrillation with RVR QRS Axis: normal Intervals: RBBB ST/T Wave abnormalities: normal Narrative Interpretation: no evidence of acute ischemia  RADIOLOGY  CT lumbar/thoracic spine: I independently viewed and interpreted the images; there is a T11 endplate fracture.  Radiology report indicates the following:  IMPRESSION:  1. Acute fracture through the superior endplate of T11 extending to  involve the posterior elements of T10 bilaterally and the spinous  process of T9. No evidence of epidural hematoma.  2. No acute fracture or traumatic listhesis in the lumbar spine.    Aortic Atherosclerosis (ICD10-I70.0).     PROCEDURES:  Critical Care performed: No  Procedures   MEDICATIONS ORDERED IN ED: Medications  acetaminophen (TYLENOL) tablet 650 mg (has no administration in time range)    Or  acetaminophen (TYLENOL) suppository 650 mg (has no administration in time range)  ondansetron (ZOFRAN) tablet 4 mg (has no administration in time range)    Or   ondansetron (ZOFRAN) injection 4 mg (has no administration in time range)  enoxaparin (LOVENOX) injection 40 mg (has no administration in time range)  docusate sodium (COLACE) capsule 100 mg (has no administration in time range)  polyethylene glycol (MIRALAX / GLYCOLAX) packet 17 g (has no administration in time range)  morphine (PF) 4 MG/ML injection 4 mg (4 mg Intravenous Given 03/17/23 1737)  fentaNYL (SUBLIMAZE) injection 25 mcg (has no administration in time range)  fentaNYL (SUBLIMAZE) injection 25 mcg (25 mcg Intravenous Given 03/17/23 1621)     IMPRESSION / MDM / ASSESSMENT AND PLAN / ED COURSE  I reviewed the triage vital signs and the nursing notes.  89 year old female with PMH as noted above presents after she was found on the floor at her facility.  The patient denies falling and states that she laid down on the floor because she was feeling dizzy and due to back pain.  On exam the patient is tachycardic with otherwise normal vital signs.  She is alert and oriented.  Lower extremity neuroexam is normal.  Differential diagnosis includes, but is not limited to:  Back pain: Muscle strain or spasm, sciatica, radiculopathy, compression fracture.  There are no neuro deficits.  Will obtain CT of the thoracic and lumbar spine.  Tachycardia: It is possible that the patient is just in atrial fibrillation and did not take her morning medications (which appear to include Cardizem).  Differential also includes dehydration, acute infection/sepsis as from UTI/pyonephritis, electrolyte abnormality, other metabolic disturbance, less likely cardiac cause.  We will obtain EKG, obtain lab workup, urinalysis, lactate, and reassess.  Patient's presentation is most consistent with acute presentation with potential threat to life or bodily function.  The patient is on the cardiac monitor to evaluate for evidence of arrhythmia and/or significant heart rate  changes.  ----------------------------------------- 4:40 PM on 03/17/2023 -----------------------------------------  Lab workup is unremarkable with no leukocytosis or anemia.  Electrolytes are normal.  CTs of the thoracic and lumbar spine show T11 endplate fracture and some involvement of T10 and T9.  I consulted Dr. Myer Haff from neurosurgery who reviewed the imaging.  He recommends that we place the patient on bedrest and obtain an MRI for further evaluation to help determine if the patient may require surgery.  The patient walks with a cane at baseline.  On reassessment she continues to have no neurologic deficits.  She states that she is fairly certain she did not fall.  Dr. Myer Haff wanted to TLSO brace but advised that we should hold off on this until after the MRI to better determine the stability of the fracture.  I counseled the patient on the results of the imaging.  I  will admit her for pain control and further workup.  I consulted Dr. Sedalia Muta from the hospitalist service; based our discussion she agreed to evaluate the patient for admission.   FINAL CLINICAL IMPRESSION(S) / ED DIAGNOSES   Final diagnoses:  Closed fracture of eleventh thoracic vertebra, unspecified fracture morphology, initial encounter (HCC)  Atrial fibrillation with RVR (HCC)  Acute bilateral low back pain without sciatica     Rx / DC Orders   ED Discharge Orders     None        Note:  This document was prepared using Dragon voice recognition software and may include unintentional dictation errors.    Dionne Bucy, MD 03/17/23 [phone]

## 2023-03-17 NOTE — H&P (Addendum)
History and Physical   Sheryl Michael NWG:[id number] DOB: 1931-07-02 DOA: 03/17/2023  PCP: Marguarite Arbour, MD  Patient coming from: [facility] nursing facility  I have personally briefly reviewed patient's old medical records in [facility] EMR.  Chief Concern: Back pain  HPI: [name] is a 89 year old female history of atrial fibrillation, hypertension, hyperlipidemia, hypothyroid, who presents emergency department from [facility] for chief concerns of back pain of unknown etiology.  Vitals in the ED showed temperature 97.8, respiration rate of 16, heart rate of 136, blood pressure 125/59, SpO2 of 96% on room air.  Serum sodium is 141, potassium 4.0, chloride 107, bicarb 24, BUN of 19, serum creatinine of 0.91, EGFR greater than 60, nonfasting blood glucose 108, WBC 8.7, hemoglobin 13.8, platelets of 153.  High sensitive troponin is 7.  Lactic acid is 1.7.  ED treatment: Fentanyl 25 mcg IV one-time dose. ---------------------------- At bedside, she was able to tell me her name, age, location, current calendar year.  She does not think that she fell, she thought that she just laid down on the floor because her back was hurting her.  She denies trauma to her person, chest pain, shortness of breath, dysuria, hematuria, diarrhea, syncope, swelling of her lower extremities.  Social history: She lives at [facility] assisted living facility.  She does her own laundry and her daily ADLs.  They provide cooking assistance for her.  She denies tobacco, EtOH, recreational drug use.  ROS: Constitutional: no weight change, no fever ENT/Mouth: no sore throat, no rhinorrhea Eyes: no eye pain, no vision changes Cardiovascular: no chest pain, no dyspnea,  no edema, no palpitations Respiratory: no cough, no sputum, no wheezing Gastrointestinal: no nausea, no vomiting, no diarrhea, no constipation Genitourinary: no urinary incontinence, no dysuria, no hematuria Musculoskeletal: no  arthralgias, no myalgias, + back pain Skin: no skin lesions, no pruritus, Neuro: + weakness, no loss of consciousness, no syncope Psych: no anxiety, no depression, no decrease appetite Heme/Lymph: no bruising, no bleeding  ED Course: Discussed with emergency medicine provider, patient requiring hospitalization for chief concerns of pain control and CT read of traumatic thoracic spine injury.  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, estrogen receptor positive (HCC)   Inadequate pain control   Depression   Assessment and Plan:  * Closed T11 fracture (HCC) Acute fractures of the superior endplate of T11 extending to involve the posterior elements of T8 10 bilaterally and spinous process of T9 Neurosurgery has been consulted and recommends patient on bedrest at this time pending MRI of the thoracic spine without contrast Appreciate further recommendations from neurosurgery: TSLO brace Neurosurgery recommends a upright x-ray after brace has been placed Symptomatic support: Morphine 4 mg IV every 4 hours as needed for moderate pain, 20 hours ordered; fentanyl 25 mcg IV every 4 hours as needed for severe pain, 15 hours ordered  Depression Escitalopram 10 mg daily resumed  Hypothyroidism, acquired Levothyroxine 50 mcg daily resumed  HLD (hyperlipidemia) Atorvastatin 40 mg daily resumed  Malignant neoplasm of breast (HCC) Anastrozole 1 mg daily resumed  Memantine 5 mg p.o. twice daily resumed Paroxysmal atrial fibrillation, Xarelto resumed Chart reviewed.   DVT prophylaxis: Xarelto Code Status: Full code Diet: Heart healthy Family Communication:  no Disposition Plan: Pending clinical course Consults called:  Neurosurgery Admission status: Telemetry cardiac, inpatient  Past Medical History:  Diagnosis Date   Arthritis    Atrial fibrillation (HCC)    Breast cancer (HCC) 2006   right breast ca with lumpectomy and rad tx, left breast ca 2021lumpectomy and rad tx   Colon polyp    Complication of anesthesia    questions about husband who passed in 2012    Cystocele    Depression    Dysrhythmia    Family history of breast cancer    Female bladder prolapse    GERD (gastroesophageal reflux disease)    Goiter    Hyperlipemia    Hypertension    Hypothyroidism    Osteoporosis    Personal history of radiation therapy 2006   right breast ca and left breast 2021   Pneumonia    Procidentia of uterus    Recurrent UTI    Reflux    TIA (transient ischemic attack)    Vaginal atrophy    Past Surgical History:  Procedure Laterality Date   APPENDECTOMY     BREAST BIOPSY Right 2006   breast cancer   BREAST BIOPSY Left 07/17/2020   [place] bx, vision marker, IMC with lobular features   BREAST LUMPECTOMY Right 2006   positive   BREAST LUMPECTOMY Left 08/03/2020   left breast invasive lobular carcinoma, negative LNs   BREAST SURGERY Right    lumpectomy   CATARACT EXTRACTION W/ INTRAOCULAR LENS  IMPLANT, BILATERAL     COLONOSCOPY     CYSTOCELE REPAIR N/A 12/21/2015   Procedure: ANTERIOR REPAIR (CYSTOCELE);  Surgeon: Herold Harms, MD;  Location: [facility] ORS;  Service: Gynecology;  Laterality: N/A;   DILATION AND CURETTAGE OF UTERUS     ELECTROMAGNETIC NAVIGATION BROCHOSCOPY Right 12/13/2018   Procedure: ELECTROMAGNETIC NAVIGATION BRONCHOSCOPY;  Surgeon: Erin Fulling, MD;  Location: [facility] ORS;  Service: Cardiopulmonary;  Laterality: Right;   EYE SURGERY     PARTIAL MASTECTOMY WITH NEEDLE LOCALIZATION AND AXILLARY SENTINEL LYMPH NODE BX Left 08/03/2020   Procedure: PARTIAL MASTECTOMY WITH Radio Frequency tag AND AXILLARY SENTINEL LYMPH NODE BX;  Surgeon: Carolan Shiver, MD;  Location: [facility]  ORS;  Service: General;  Laterality: Left;   VAGINAL HYSTERECTOMY Bilateral 12/21/2015   Procedure: TVH BSO;  Surgeon: Herold Harms, MD;  Location: [facility] ORS;  Service: Gynecology;  Laterality: Bilateral;   Social History:  reports that she has never smoked. She has never used smokeless tobacco. She reports that she does not drink alcohol and does not use drugs.  Allergies  Allergen Reactions   Bactrim [Sulfamethoxazole-Trimethoprim] Hives   Iodinated Contrast Media Anaphylaxis   Codeine Nausea And Vomiting   Erythromycin Ethylsuccinate Other (See Comments)    Unknown    Phenobarbital Other (See Comments)    Feeling funny, nervous   Ciprofloxacin Rash   Penicillins Rash   Family History  Problem Relation Age of Onset   Diabetes Sister    Breast cancer Sister        late 55's   Diabetes Brother    Diabetes Sister    Family history: Family history reviewed and not pertinent  Prior to Admission medications   Medication Sig Start Date End Date Taking? Authorizing Provider  acetaminophen (TYLENOL) 325 MG tablet Take 650 mg by mouth every 6 (six) hours as needed (pain.).     [provider]  albuterol (VENTOLIN HFA) 108 (90 Base) MCG/ACT inhaler Inhale 2 puffs into the lungs  every 6 (six) hours as needed for wheezing or shortness of breath. 05/16/22   Merwyn Katos, MD  anastrozole (ARIMIDEX) 1 MG tablet Take 1 tablet (1 mg total) by mouth daily. 12/21/22   Earna Coder, MD  apixaban (ELIQUIS) 2.5 MG TABS tablet Take 1 tablet (2.5 mg total) by mouth 2 (two) times daily. Patient not taking: Reported on 06/22/2022 05/31/17   Shaune Pollack, MD  atorvastatin (LIPITOR) 40 MG tablet Take by mouth. 05/26/22 05/26/23  [provider]  Calcium Carb-Cholecalciferol 600-20 MG-MCG TABS 1 tablet with a meal Orally Once a day    [provider]  Calcium Carbonate-Vitamin D 600-200 MG-UNIT TABS Take 1 tablet by mouth daily.  Patient not taking: Reported on 02/02/2022     [provider]  cephALEXin (KEFLEX) 250 MG capsule Take 1 capsule (250 mg total) by mouth daily. Patient not taking: Reported on 02/02/2022 07/19/21   Alfredo Martinez, MD  Cholecalciferol (VITAMIN D3) 10 MCG (400 UNIT) tablet Take 400 Units by mouth daily.    [provider]  cyanocobalamin (,VITAMIN B-12,) 1000 MCG/ML injection Inject 1,000 mcg into the muscle every 28 (twenty-eight) days. Patient not taking: Reported on 12/11/2022 03/03/20   [provider]  diltiazem (CARDIZEM CD) 120 MG 24 hr capsule Take 1 capsule (120 mg total) by mouth daily. Patient not taking: Reported on 12/21/2022 05/07/20   Lynn Ito, MD  escitalopram (LEXAPRO) 10 MG tablet Take 10 mg by mouth daily.     [provider]  feeding supplement, ENSURE ENLIVE, (ENSURE ENLIVE) LIQD Take 237 mLs by mouth 2 (two) times daily between meals. Patient not taking: Reported on 08/04/2021 03/08/20   Esaw Grandchild A, DO  Lactobacillus (PROBIOTIC ACIDOPHILUS) CAPS Take 1 capsule by mouth in the morning and at bedtime.    [provider]  levothyroxine (SYNTHROID, LEVOTHROID) 50 MCG tablet Take 50 mcg by mouth daily before breakfast.    [provider]  metroNIDAZOLE (FLAGYL) 500 MG tablet Take 500 mg by mouth 3 (three) times daily. Patient not taking: Reported on 12/21/2022 11/24/22   [provider]  nystatin cream (MYCOSTATIN) Apply to affected area 2 times daily 12/11/22   Mickie Bail, NP  omeprazole (PRILOSEC) 20 MG capsule Take 20 mg by mouth daily. 01/31/20   [provider]  predniSONE (STERAPRED UNI-PAK 21 TAB) 10 MG (21) TBPK tablet As directed on packaging Patient not taking: Reported on 06/22/2022 05/16/22   Merwyn Katos, MD  Rivaroxaban Carlena Hurl) 15 MG TABS tablet Take by mouth. 05/27/22   [provider]  Spacer/Aero-Holding Deretha Emory (AEROCHAMBER MV) inhaler Use as instructed Patient not taking: Reported on 06/22/2022 05/16/22   Merwyn Katos,  MD  tobramycin-dexamethasone [facility]) ophthalmic solution SMARTSIG:In Eye(s) 12/14/22   [provider]   Physical Exam: Vitals:   03/17/23 1311 03/17/23 1620 03/17/23 1800 03/17/23 1913  BP: (!) 125/59 (!) 143/75 (!) 141/54 (!) 146/55  Pulse: (!) 136 (!) 135 66 68  Resp: 16 20  18   Temp: 97.8 F (36.6 C)     SpO2: 96% 96% 96% 95%  Weight:      Height:       Constitutional: appears age-appropriate, frail, cachectic appearing, NAD, calm Eyes: PERRL, lids and conjunctivae normal ENMT: Mucous membranes are moist. Posterior pharynx clear of any exudate or lesions. Age-appropriate dentition. Hearing appropriate Neck: normal, supple, no masses, no thyromegaly Respiratory: clear to auscultation bilaterally, no wheezing, no crackles. Normal respiratory effort. No accessory muscle  use.  Cardiovascular: Regular rate and rhythm, no murmurs / rubs / gallops. No extremity edema. 2+ pedal pulses. No carotid bruits.  Abdomen: no tenderness, no masses palpated, no hepatosplenomegaly. Bowel sounds positive.  Musculoskeletal: no clubbing / cyanosis. No joint deformity upper and lower extremities. Good ROM, no contractures, no atrophy. Normal muscle tone.  Back pain Skin: no rashes, lesions, ulcers. No induration Neurologic: Sensation intact. Strength 5/5 in all 4.  Psychiatric: Normal judgment and insight. Alert and oriented x 3. Normal mood.   EKG: independently reviewed, showing atrial fibrillation with rate of 138, QTc 512  Chest x-ray on Admission: I personally reviewed and I agree with radiologist reading as below.  MR THORACIC SPINE WO CONTRAST  Result Date: 03/17/2023 CLINICAL DATA:  Acute fracture through the superior endplate of T11, involving the posterior elements of T10 and spinous process of T9 EXAM: MRI THORACIC SPINE WITHOUT CONTRAST TECHNIQUE: Multiplanar, multisequence MR imaging of the thoracic spine was performed. No intravenous contrast was administered. COMPARISON:   03/17/2023 CT thoracic spine FINDINGS: Alignment:  No listhesis. Vertebrae: Redemonstrated acute fracture through the vertebral body of T11 (series 12, image 9), without significant retropulsion. Increased T2 signal extends into the pedicles and facets bilaterally (series 21, images 6 and 13). Minimal vertebral body height loss. No significant T2 hyperintense signal is seen associated with the T10 posterior element fractures or with the T9 spinous process fracture. Additional T2 hyperintense signal is seen at the superior aspect of L1 (series 21, image 11), which is not appreciated on the prior CT, with a fracture plane extending from the anterior superior aspect to the posterior mid to inferior cortex. No significant retropulsion. Minimal vertebral body height loss. No other acute fracture is seen. No suspicious osseous lesions or evidence of discitis osteomyelitis. Cord: Normal signal and morphology. No evidence of epidural collection. Paraspinal and other soft tissues: Small amount of prevertebral edema anterior to T10 and T11. Small hiatal hernia. Disc levels: No significant spinal canal stenosis or neural foraminal narrowing. IMPRESSION: 1. Redemonstrated acute fracture through the vertebral body of T11, without significant retropulsion. Edema extends into the pedicles and facets bilaterally. 2. Acute fracture of the superior aspect of L1, which is not appreciated on the prior CT, with minimal vertebral body height loss but no significant retropulsion. 3. No significant T2 hyperintense signal is seen associated with the T10 posterior element fractures or with the T9 spinous process fracture. 4. No significant spinal canal stenosis or neural foraminal narrowing in the thoracic spine. These results were called by telephone at the time of interpretation on 03/17/2023 at 7:40 pm to provider [facility] , who verbally acknowledged these results. Electronically Signed   By: Wiliam Ke M.D.   On: 03/17/2023 19:42    CT Thoracic Spine Wo Contrast  Result Date: 03/17/2023 CLINICAL DATA:  Back trauma, no prior imaging (Age >= 16y). EXAM: CT THORACIC AND LUMBAR SPINE WITHOUT CONTRAST TECHNIQUE: Multidetector CT imaging of the thoracic and lumbar spine was performed without contrast. Multiplanar CT image reconstructions were also generated. RADIATION DOSE REDUCTION: This exam was performed according to the departmental dose-optimization program which includes automated exposure control, adjustment of the mA and/or kV according to patient size and/or use of iterative reconstruction technique. COMPARISON:  None Available. FINDINGS: CT THORACIC SPINE FINDINGS Alignment: Normal. Vertebrae: Acute fracture through the superior endplate of T11 extending to involve the posterior elements of T10 bilaterally in the spinous process of T9 (sagittal image 29 series 6). No evidence of epidural hematoma.  Mild paraspinal fat stranding. Underlying diffuse idiopathic skeletal hyperostosis. Paraspinal and other soft tissues: Aortic atherosclerosis. Coronary artery calcifications. Small hiatal hernia. Disc levels: No spinal canal stenosis or neural foraminal narrowing in the thoracic spine. CT LUMBAR SPINE FINDINGS Segmentation: Standard. Alignment: Normal. Vertebrae: Normal vertebral body heights.  No acute fracture. Paraspinal and other soft tissues: Sigmoid diverticulosis. Disc levels: Disc bulge and facet arthropathy contribute to mild spinal canal stenosis at L4-5. IMPRESSION: 1. Acute fracture through the superior endplate of T11 extending to involve the posterior elements of T10 bilaterally and the spinous process of T9. No evidence of epidural hematoma. 2. No acute fracture or traumatic listhesis in the lumbar spine. Aortic Atherosclerosis (ICD10-I70.0). These results were called by telephone at the time of interpretation on 03/17/2023 at 2:59 pm to provider [facility] , who verbally acknowledged these results. Electronically  Signed   By: Orvan Falconer M.D.   On: 03/17/2023 15:03   CT Lumbar Spine Wo Contrast  Result Date: 03/17/2023 CLINICAL DATA:  Back trauma, no prior imaging (Age >= 16y). EXAM: CT THORACIC AND LUMBAR SPINE WITHOUT CONTRAST TECHNIQUE: Multidetector CT imaging of the thoracic and lumbar spine was performed without contrast. Multiplanar CT image reconstructions were also generated. RADIATION DOSE REDUCTION: This exam was performed according to the departmental dose-optimization program which includes automated exposure control, adjustment of the mA and/or kV according to patient size and/or use of iterative reconstruction technique. COMPARISON:  None Available. FINDINGS: CT THORACIC SPINE FINDINGS Alignment: Normal. Vertebrae: Acute fracture through the superior endplate of T11 extending to involve the posterior elements of T10 bilaterally in the spinous process of T9 (sagittal image 29 series 6). No evidence of epidural hematoma. Mild paraspinal fat stranding. Underlying diffuse idiopathic skeletal hyperostosis. Paraspinal and other soft tissues: Aortic atherosclerosis. Coronary artery calcifications. Small hiatal hernia. Disc levels: No spinal canal stenosis or neural foraminal narrowing in the thoracic spine. CT LUMBAR SPINE FINDINGS Segmentation: Standard. Alignment: Normal. Vertebrae: Normal vertebral body heights.  No acute fracture. Paraspinal and other soft tissues: Sigmoid diverticulosis. Disc levels: Disc bulge and facet arthropathy contribute to mild spinal canal stenosis at L4-5. IMPRESSION: 1. Acute fracture through the superior endplate of T11 extending to involve the posterior elements of T10 bilaterally and the spinous process of T9. No evidence of epidural hematoma. 2. No acute fracture or traumatic listhesis in the lumbar spine. Aortic Atherosclerosis (ICD10-I70.0). These results were called by telephone at the time of interpretation on 03/17/2023 at 2:59 pm to provider [facility] , who  verbally acknowledged these results. Electronically Signed   By: Orvan Falconer M.D.   On: 03/17/2023 15:03    Labs on Admission: I have personally reviewed following labs CBC: Recent Labs  Lab 03/17/23 1454  WBC 8.7  NEUTROABS 7.4  HGB 13.8  HCT 42.2  MCV 97.5  PLT 153   Basic Metabolic Panel: Recent Labs  Lab 03/17/23 1454  NA 141  K 4.0  CL 107  CO2 24  GLUCOSE 108*  BUN 19  CREATININE 0.91  CALCIUM 8.9   GFR: Estimated Creatinine Clearance: 30.1 mL/min (by C-G formula based on SCr of 0.91 mg/dL).  Liver Function Tests: Recent Labs  Lab 03/17/23 1454  AST 25  ALT 19  ALKPHOS 87  BILITOT 1.0  PROT 6.3*  ALBUMIN 3.1*   Urine analysis:    Component Value Date/Time   COLORURINE YELLOW (A) 05/04/2020 2154   APPEARANCEUR Clear 07/19/2021 1306   LABSPEC 1.012 05/04/2020 2154   PHURINE 5.0  05/04/2020 2154   GLUCOSEU Negative 07/19/2021 1306   HGBUR NEGATIVE 05/04/2020 2154   BILIRUBINUR Negative 07/19/2021 1306   KETONESUR NEGATIVE 05/04/2020 2154   PROTEINUR Negative 07/19/2021 1306   PROTEINUR NEGATIVE 05/04/2020 2154   NITRITE Negative 07/19/2021 1306   NITRITE NEGATIVE 05/04/2020 2154   LEUKOCYTESUR Negative 07/19/2021 1306   LEUKOCYTESUR SMALL (A) 05/04/2020 2154   This document was prepared using Dragon Voice Recognition software and may include unintentional dictation errors.  Dr. Sedalia Muta [facility]  If 7PM-7AM, please contact overnight-coverage provider If 7AM-7PM, please contact day attending provider [email or web address]  03/17/2023, 7:56 PM

## 2023-03-17 NOTE — Assessment & Plan Note (Addendum)
Acute fractures of the superior endplate of T11 extending to involve the posterior elements of T8 10 bilaterally and spinous process of T9 Neurosurgery has been consulted and recommends patient on bedrest at this time pending MRI of the thoracic spine without contrast Appreciate further recommendations from neurosurgery: TSLO brace Neurosurgery recommends a upright x-ray after brace has been placed Symptomatic support: Morphine 4 mg IV every 4 hours as needed for moderate pain, 20 hours ordered; fentanyl 25 mcg IV every 4 hours as needed for severe pain, 15 hours ordered

## 2023-03-17 NOTE — Progress Notes (Signed)
Orthopedic Tech Progress Note Patient Details:  Sheryl Michael 12/04/1930 [id number]  Patient ID: [name], female   DOB: [birth year], 89 y.o.   MRN: [id number] TLSO ordered from [facility].  Darleen Crocker 03/17/2023, 5:36 PM

## 2023-03-17 NOTE — ED Notes (Signed)
Secretary paged company for the TLSO brace.

## 2023-03-17 NOTE — Assessment & Plan Note (Signed)
-   Atorvastatin 40 mg daily resumed 

## 2023-03-17 NOTE — Progress Notes (Signed)
       CROSS COVER NOTE  NAME: Sheryl Michael MRN: [id number] DOB : 01/24/1931    Concern from nurse Sheryl Michael   This patient was admitted by Dr Sheryl Michael for a broken back. Pt's family is at bedside and asking for the results of the MRI.   Pertinent findings on chart review: Recent history and physical by Dr Sheryl Michael reviewed Dr. Osborne Michael note reviewed MRI report reviewed  Assessment and  Interventions   Assessment/Plan: Confirmed from nurse that family is okay with a phone call Spoke with daughter Sheryl Michael over the phone, and explained assessment by Dr. Marcell Michael with plans for TLSO brace and follow-up x-rays.   -continue management per H&P

## 2023-03-17 NOTE — ED Triage Notes (Signed)
Presents via EMS from [facility] the staff found her on the floor  Pt denies fall  states the pain became so severe that she laid down on the floor    Was given zofran 4 mg and  fentanyl 50 given P

## 2023-03-17 NOTE — Assessment & Plan Note (Signed)
-   Levothyroxine 50 mcg daily resumed 

## 2023-03-17 NOTE — Consult Note (Signed)
Full note to follow.   Patient suffered fall and had acute back pain.  CT shows T9, T10 T11 fx.  MRI shows only T11 and likely L1 fx are acute, and do not suggest obvious instability.  - TLSO - Will get T spine upright xrays once in brace to evaluate alignment

## 2023-03-17 NOTE — H&P (View-Only) (Signed)
       CROSS COVER NOTE  NAME: Sheryl Michael MRN: [id number] DOB : 07/17/1931    Concern from nurse /staff    patient unable to take PO meds because of inability to tolerate sitting up at this time (even with pain med).     Pertinent findings on chart review: As per previous note  Assessment and  Interventions   Assessment: Patient unable to tolerate sitting up to safely swallow Plan: Hold oral meds oral feeds overnight IV hydration (was earlier requested by daughter Sheryl Michael) IV pain control SLP eval in a.m.       

## 2023-03-17 NOTE — ED Notes (Signed)
This RN attempted to sit patient up to take sip of water and administer night medication. Patient unable to tolerate sitting up. Patient given morphine and zofran.

## 2023-03-17 NOTE — Assessment & Plan Note (Signed)
Continue anastrozole  

## 2023-03-17 NOTE — ED Notes (Signed)
MRI on the phone talking with daughter of patient.

## 2023-03-17 NOTE — Hospital Course (Addendum)
[name] is a 89 year old female history of atrial fibrillation, hypertension, hyperlipidemia, hypothyroid. She presents emergency department 03/17/23 [facility] for chief concerns of back pain of unknown etiology. Question trauma as she was found on the floor. She does not think that she fell, she thought that she just laid down on the floor because her back was hurting her.  She denies any neurological symptoms currently other than pain. 05/31: MRI shows T11 and likely L1 fx are acute, and do not suggest obvious instability. Neurosurgery advised TLSO and T-spine upright xrays once in brace to evaluate alignment 06/01: repeat XR show increasing angulation, planning for surgery for spinal fusion Monday following Xarelto washout. Afib w/ intermittent RVR, IV dilt push ineffective, started dilt gtt and converted to NSR. Paused gtt , increased po dose dilt, no further events until...  06/02: Again in rapid Afib early AM, restarted dilt gtt + heparin gtt anticipating surgery.  06/03: T8-L2 posterior spinal fusion w/ T8-9 and L1-2 cement augmentation, for T11 and L1 fractures.  06/04: NSR off dilt gtt. Stable post-op, PT/OT to see.   Consultants:  Neurosurgery    Procedures: 03/20/2023 T8-L2 posterior spinal fusion w/ T8-9 and L1-2 cement augmentation, for T11 and L1 fractures.            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 Neurosurgery following - she is POD1 today 03/21/23 s/p T8-L2 posterior spinal fusion w/ T8-9 and L1-2  cement augmentation, for T11 and L1 fractures.  Mobilize as able w/ PT/OT Pain control Incisional vac to continue    Afib RVR onset 03/18/23 - resolved  Paroxysmal Afib Xarelto at home - resume DVT ppx here so restarted Lovenox, would avoid therapeutic anticoagulation for at least 14 days post-op per Dr Sheryl Michael unless life-threatening need for it    Malignant neoplasm of breast Anastrozole 1 mg daily resumed   HLD (hyperlipidemia) Atorvastatin 40 mg daily resumed   Hypothyroidism, acquired Levothyroxine 50 mcg daily resumed   Depression, Dementia Escitalopram 10 mg daily and memantine 5 mg po daily resumed           DVT prophylaxis: lovenox (plan restart Xarelto on 06/17) Pertinent IV fluids/nutrition: no continuous IV fluids  Central lines / invasive devices: none   Code Status: FULL CODE ACP documentation reviewed: 03/18/23 none on file [facility]    Current Admission Status: inpatient  TOC needs / Dispo plan: pend neurosurgery recs w/ PT/OT eval following procedure, expect may need SNF placement  Barriers to discharge / significant pending items: pain control, neurosurgery recs w/ PT/OT eval, expect will be medically stable perhaps mid- to late-week

## 2023-03-17 NOTE — Assessment & Plan Note (Signed)
-   Escitalopram 10 mg daily resumed ?

## 2023-03-17 NOTE — ED Notes (Signed)
Pt to MRI

## 2023-03-18 ENCOUNTER — Inpatient Hospital Stay: Payer: Medicare Other

## 2023-03-18 DIAGNOSIS — Z170 Estrogen receptor positive status [ER+]: Secondary | ICD-10-CM

## 2023-03-18 DIAGNOSIS — R52 Pain, unspecified: Secondary | ICD-10-CM

## 2023-03-18 DIAGNOSIS — S32019A Unspecified fracture of first lumbar vertebra, initial encounter for closed fracture: Secondary | ICD-10-CM

## 2023-03-18 DIAGNOSIS — K219 Gastro-esophageal reflux disease without esophagitis: Secondary | ICD-10-CM | POA: Diagnosis not present

## 2023-03-18 DIAGNOSIS — C50919 Malignant neoplasm of unspecified site of unspecified female breast: Secondary | ICD-10-CM

## 2023-03-18 DIAGNOSIS — C50512 Malignant neoplasm of lower-outer quadrant of left female breast: Secondary | ICD-10-CM

## 2023-03-18 DIAGNOSIS — S22009A Unspecified fracture of unspecified thoracic vertebra, initial encounter for closed fracture: Secondary | ICD-10-CM | POA: Diagnosis present

## 2023-03-18 DIAGNOSIS — M532X4 Spinal instabilities, thoracic region: Secondary | ICD-10-CM

## 2023-03-18 DIAGNOSIS — E782 Mixed hyperlipidemia: Secondary | ICD-10-CM | POA: Diagnosis not present

## 2023-03-18 DIAGNOSIS — M40294 Other kyphosis, thoracic region: Secondary | ICD-10-CM

## 2023-03-18 DIAGNOSIS — S22089A Unspecified fracture of T11-T12 vertebra, initial encounter for closed fracture: Secondary | ICD-10-CM | POA: Diagnosis not present

## 2023-03-18 DIAGNOSIS — F32A Depression, unspecified: Secondary | ICD-10-CM | POA: Diagnosis not present

## 2023-03-18 DIAGNOSIS — E039 Hypothyroidism, unspecified: Secondary | ICD-10-CM

## 2023-03-18 DIAGNOSIS — W19XXXA Unspecified fall, initial encounter: Secondary | ICD-10-CM

## 2023-03-18 LAB — CBC
HCT: 41.2 % (ref 36.0–46.0)
Hemoglobin: 13 g/dL (ref 12.0–15.0)
MCH: 31.5 pg (ref 26.0–34.0)
MCHC: 31.6 g/dL (ref 30.0–36.0)
MCV: 99.8 fL (ref 80.0–100.0)
Platelets: 136 10*3/uL — ABNORMAL LOW (ref 150–400)
RBC: 4.13 MIL/uL (ref 3.87–5.11)
RDW: 15 % (ref 11.5–15.5)
WBC: 5.8 10*3/uL (ref 4.0–10.5)
nRBC: 0 % (ref 0.0–0.2)

## 2023-03-18 LAB — BASIC METABOLIC PANEL
Anion gap: 8 (ref 5–15)
BUN: 22 mg/dL (ref 8–23)
CO2: 25 mmol/L (ref 22–32)
Calcium: 8.7 mg/dL — ABNORMAL LOW (ref 8.9–10.3)
Chloride: 109 mmol/L (ref 98–111)
Creatinine, Ser: 0.99 mg/dL (ref 0.44–1.00)
GFR, Estimated: 55 mL/min — ABNORMAL LOW (ref >60)
Glucose, Bld: 84 mg/dL (ref 70–99)
Potassium: 4.2 mmol/L (ref 3.5–5.1)
Sodium: 142 mmol/L (ref 135–145)

## 2023-03-18 LAB — PROTIME-INR
INR: 1.1 (ref 0.8–1.2)
Prothrombin Time: 14.7 seconds (ref 11.4–15.2)

## 2023-03-18 LAB — TROPONIN I (HIGH SENSITIVITY): Troponin I (High Sensitivity): 9 ng/L (ref <18)

## 2023-03-18 LAB — HEPARIN LEVEL (UNFRACTIONATED): Heparin Unfractionated: 0.32 IU/mL (ref 0.30–0.70)

## 2023-03-18 LAB — APTT: aPTT: 33 seconds (ref 24–36)

## 2023-03-18 MED ORDER — HEPARIN BOLUS VIA INFUSION
2700.0000 [IU] | Freq: Once | INTRAVENOUS | Status: AC
  Administered 2023-03-18: 2700 [IU] via INTRAVENOUS
  Filled 2023-03-18: qty 2700

## 2023-03-18 MED ORDER — FENTANYL CITRATE PF 50 MCG/ML IJ SOSY
12.5000 ug | PREFILLED_SYRINGE | INTRAMUSCULAR | Status: DC | PRN
  Administered 2023-03-18 – 2023-03-20 (×13): 12.5 ug via INTRAVENOUS
  Filled 2023-03-18 (×13): qty 1

## 2023-03-18 MED ORDER — DILTIAZEM HCL 25 MG/5ML IV SOLN
INTRAVENOUS | Status: AC
  Filled 2023-03-18: qty 5

## 2023-03-18 MED ORDER — DILTIAZEM HCL ER COATED BEADS 180 MG PO CP24
180.0000 mg | ORAL_CAPSULE | Freq: Every day | ORAL | Status: DC
  Administered 2023-03-18 – 2023-04-05 (×18): 180 mg via ORAL
  Filled 2023-03-18 (×18): qty 1

## 2023-03-18 MED ORDER — HEPARIN (PORCINE) 25000 UT/250ML-% IV SOLN
950.0000 [IU]/h | INTRAVENOUS | Status: DC
  Administered 2023-03-18: 750 [IU]/h via INTRAVENOUS
  Administered 2023-03-19: 1000 [IU]/h via INTRAVENOUS
  Filled 2023-03-18 (×2): qty 250

## 2023-03-18 MED ORDER — DILTIAZEM HCL-DEXTROSE 125-5 MG/125ML-% IV SOLN (PREMIX)
5.0000 mg/h | INTRAVENOUS | Status: DC
  Administered 2023-03-18: 2.5 mg/h via INTRAVENOUS
  Administered 2023-03-19: 5 mg/h via INTRAVENOUS
  Administered 2023-03-21: 10 mg/h via INTRAVENOUS
  Administered 2023-03-21: 5 mg/h via INTRAVENOUS
  Filled 2023-03-18 (×3): qty 125

## 2023-03-18 MED ORDER — LIDOCAINE 5 % EX PTCH
1.0000 | MEDICATED_PATCH | CUTANEOUS | Status: DC
  Administered 2023-03-18 – 2023-04-04 (×13): 1 via TRANSDERMAL
  Filled 2023-03-18 (×20): qty 1

## 2023-03-18 MED ORDER — DILTIAZEM HCL 25 MG/5ML IV SOLN
10.0000 mg | Freq: Once | INTRAVENOUS | Status: AC
  Administered 2023-03-18: 10 mg via INTRAVENOUS

## 2023-03-18 NOTE — ED Notes (Addendum)
Grand-daughter Lidia Collum at [facility]. Wants to be updated, and would like to speak with SW.

## 2023-03-18 NOTE — ED Notes (Signed)
Nt was delegated by nurse to put PT on a pure wick. During putting PT on Pure wick Pt was saying "oh my God". This NT stopped and asked if PT was in any pain. PT stated that she was not in any pain but was uncomfortable and wanted to readjust . This NT finish putting on pure wick. Nurse was notified about completion of pure wick and PT being uncomfortable and wanting to readjust.

## 2023-03-18 NOTE — ED Notes (Signed)
Admitting at [facility]

## 2023-03-18 NOTE — Consult Note (Signed)
Consult requested by:  Siadecki  Consult requested for:  Thoracic fracture  Primary Physician:  Marguarite Arbour, MD  History of Present Illness: 03/18/2023 [facility] is here today with a chief complaint of unclear trauma with onset of severe back pain.  She was found on the floor.  She has lower back pain.  She is living in an assisted living facility.  She denies any neurological symptoms currently other than pain.  She is currently in a TLSO brace.  The symptoms are causing a significant impact on the patient's life.   I have utilized the care everywhere function in epic to review the outside records available from external health systems.  Review of Systems:  A 10 point review of systems is negative, except for the pertinent positives and negatives detailed in the HPI.  Past Medical History: Past Medical History:  Diagnosis Date   Arthritis    Atrial fibrillation (HCC)    Breast cancer (HCC) 2006   right breast ca with lumpectomy and rad tx, left breast ca 2021lumpectomy and rad tx   Colon polyp    Complication of anesthesia    questions about husband who passed in 2012    Cystocele    Depression    Dysrhythmia    Family history of breast cancer    Female bladder prolapse    GERD (gastroesophageal reflux disease)    Goiter    Hyperlipemia    Hypertension    Hypothyroidism    Osteoporosis    Personal history of radiation therapy 2006   right breast ca and left breast 2021   Pneumonia    Procidentia of uterus    Recurrent UTI    Reflux    TIA (transient ischemic attack)    Vaginal atrophy     Past Surgical History: Past Surgical History:  Procedure Laterality Date   APPENDECTOMY     BREAST BIOPSY Right 2006   breast cancer   BREAST BIOPSY Left 07/17/2020   [place] bx, vision marker, IMC with lobular features   BREAST LUMPECTOMY Right 2006   positive   BREAST LUMPECTOMY Left 08/03/2020   left breast invasive lobular carcinoma, negative LNs    BREAST SURGERY Right    lumpectomy   CATARACT EXTRACTION W/ INTRAOCULAR LENS  IMPLANT, BILATERAL     COLONOSCOPY     CYSTOCELE REPAIR N/A 12/21/2015   Procedure: ANTERIOR REPAIR (CYSTOCELE);  Surgeon: Herold Harms, MD;  Location: [facility];  Service: Gynecology;  Laterality: N/A;   DILATION AND CURETTAGE OF UTERUS     ELECTROMAGNETIC NAVIGATION BROCHOSCOPY Right 12/13/2018   Procedure: ELECTROMAGNETIC NAVIGATION BRONCHOSCOPY;  Surgeon: Erin Fulling, MD;  Location: [facility];  Service: Cardiopulmonary;  Laterality: Right;   EYE SURGERY     PARTIAL MASTECTOMY WITH NEEDLE LOCALIZATION AND AXILLARY SENTINEL LYMPH NODE BX Left 08/03/2020   Procedure: PARTIAL MASTECTOMY WITH Radio Frequency tag AND AXILLARY SENTINEL LYMPH NODE BX;  Surgeon: Carolan Shiver, MD;  Location: [facility];  Service: General;  Laterality: Left;   VAGINAL HYSTERECTOMY Bilateral 12/21/2015   Procedure: TVH BSO;  Surgeon: Herold Harms, MD;  Location: [facility];  Service: Gynecology;  Laterality: Bilateral;    Allergies: Allergies as of 03/17/2023 - Review Complete 03/17/2023  Allergen Reaction Noted   Bactrim [sulfamethoxazole-trimethoprim] Hives 12/18/2018   Iodinated contrast media Anaphylaxis 10/07/2015   Codeine Nausea And Vomiting 10/07/2015   Erythromycin ethylsuccinate Other (See Comments) 02/26/2014   Phenobarbital Other (See Comments) 10/07/2015  Ciprofloxacin Rash 09/18/2018   Penicillins Rash 07/29/2020    Medications: Current Meds  Medication Sig   acetaminophen (TYLENOL) 325 MG tablet Take 650 mg by mouth every 6 (six) hours as needed (pain.).    anastrozole (ARIMIDEX) 1 MG tablet Take 1 tablet (1 mg total) by mouth daily.   atorvastatin (LIPITOR) 40 MG tablet Take by mouth.   Calcium Carb-Cholecalciferol 600-20 MG-MCG TABS 1 tablet with a meal Orally Once a day   cephALEXin (KEFLEX) 250 MG capsule Take 1 capsule (250 mg total) by mouth daily.   Cholecalciferol (VITAMIN D3) 10 MCG  (400 UNIT) tablet Take 400 Units by mouth daily.   cyanocobalamin (,VITAMIN B-12,) 1000 MCG/ML injection Inject 1,000 mcg into the muscle every 28 (twenty-eight) days.   diltiazem (CARDIZEM CD) 120 MG 24 hr capsule Take 1 capsule (120 mg total) by mouth daily.   escitalopram (LEXAPRO) 10 MG tablet Take 10 mg by mouth daily.    Lactobacillus (PROBIOTIC ACIDOPHILUS) CAPS Take 1 capsule by mouth in the morning and at bedtime.   levothyroxine (SYNTHROID, LEVOTHROID) 50 MCG tablet Take 50 mcg by mouth daily before breakfast.   memantine (NAMENDA) 5 MG tablet Take 5 mg by mouth 2 (two) times daily.   omeprazole (PRILOSEC) 20 MG capsule Take 20 mg by mouth daily.   Rivaroxaban (XARELTO) 15 MG TABS tablet Take by mouth.    Social History: Social History   Tobacco Use   Smoking status: Never   Smokeless tobacco: Never  Vaping Use   Vaping Use: Never used  Substance Use Topics   Alcohol use: No   Drug use: No    Family Medical History: Family History  Problem Relation Age of Onset   Diabetes Sister    Breast cancer Sister        late 85's   Diabetes Brother    Diabetes Sister     Physical Examination: Vitals:   03/18/23 0100 03/18/23 0400  BP: (!) 137/96 (!) 120/39  Pulse: 66 60  Resp: (!) 21 14  Temp:    SpO2: 96% 98%    General: Patient is in mild pain. Attention to examination is appropriate.  Neck:   Supple.  Full range of motion.  Respiratory: Patient is breathing without any difficulty.   NEUROLOGICAL:     Awake, alert, oriented to person and place.  Speech is clear and fluent.  Cranial Nerves: Pupils equal round and reactive to light.  Facial tone is symmetric.  Facial sensation is symmetric. Shoulder shrug is symmetric. Tongue protrusion is midline.    Strength: Side Biceps Triceps Deltoid Interossei Grip Wrist Ext. Wrist Flex.  R 5 5 5 5 5 5 5   L 5 5 5 5 5 5 5    Side Iliopsoas Quads Hamstring PF DF EHL  R 5 5 5 5 5 5   L 5 5 5 5 5 5    Reflexes are 1+  and symmetric at the biceps, triceps, brachioradialis, patella and achilles.   Hoffman's is absent.   Bilateral upper and lower extremity sensation is intact to light touch.    No evidence of dysmetria noted.  Gait is untested.     Medical Decision Making  Imaging: MRI T spine 03/17/2023 IMPRESSION: 1. Redemonstrated acute fracture through the vertebral body of T11, without significant retropulsion. Edema extends into the pedicles and facets bilaterally. 2. Acute fracture of the superior aspect of L1, which is not appreciated on the prior CT, with minimal vertebral body height loss but  no significant retropulsion. 3. No significant T2 hyperintense signal is seen associated with the T10 posterior element fractures or with the T9 spinous process fracture. 4. No significant spinal canal stenosis or neural foraminal narrowing in the thoracic spine.   These results were called by telephone at the time of interpretation on 03/17/2023 at 7:40 pm to provider [facility] , who verbally acknowledged these results.     Electronically Signed   By: Wiliam Ke M.D.   On: 03/17/2023 19:42  CT TL spine 03/17/2023 IMPRESSION: 1. Acute fracture through the superior endplate of T11 extending to involve the posterior elements of T10 bilaterally and the spinous process of T9. No evidence of epidural hematoma. 2. No acute fracture or traumatic listhesis in the lumbar spine.   Aortic Atherosclerosis (ICD10-I70.0).   These results were called by telephone at the time of interpretation on 03/17/2023 at 2:59 pm to provider [facility] , who verbally acknowledged these results.     Electronically Signed   By: Orvan Falconer M.D.   On: 03/17/2023 15:03  I have personally reviewed the images and agree with the above interpretation.  Assessment and Plan: [facility]yl Michael is a pleasant 89 y.o. female with acute T11 and L1 fractures.  The T9 and T10 fractures appear to be chronic given lack of  edema seen on MRI scan.  This suggest that these will be stable fractures based on the current imaging.  I have recommended a TLSO brace when out of bed.  We will obtain standing x-rays today.  If that shows maintained alignment, we will treat her fractures conservatively.    I have communicated my recommendations to the requesting physician and coordinated care to facilitate these recommendations.     Sheryl Bohnet K. Myer Haff MD, [facility] Neurosurgery

## 2023-03-18 NOTE — Progress Notes (Signed)
ANTICOAGULATION CONSULT NOTE - Initial Consult  Pharmacy Consult for Heparin Drip Indication: atrial fibrillation  Allergies  Allergen Reactions   Bactrim [Sulfamethoxazole-Trimethoprim] Hives   Iodinated Contrast Media Anaphylaxis   Codeine Nausea And Vomiting   Erythromycin Ethylsuccinate Other (See Comments)    Unknown    Phenobarbital Other (See Comments)    Feeling funny, nervous   Ciprofloxacin Rash   Penicillins Rash    Patient Measurements: Height: 5' (152.4 cm) Weight: 53.5 kg (118 lb) IBW/kg (Calculated) : 45.5 Heparin Dosing Weight: 53.5 kg  Vital Signs: Temp: 98.3 F (36.8 C) (06/01 1243) Temp Source: Oral (06/01 1243) BP: 97/44 (06/01 1600) Pulse Rate: 62 (06/01 1610)  Labs: Recent Labs    03/17/23 1454 03/18/23 0533  HGB 13.8 13.0  HCT 42.2 41.2  PLT 153 136*  CREATININE 0.91 0.99  TROPONINIHS 7 9    Estimated Creatinine Clearance: 27.7 mL/min (by C-G formula based on SCr of 0.99 mg/dL).   Medical History: Past Medical History:  Diagnosis Date   Arthritis    Atrial fibrillation (HCC)    Breast cancer (HCC) 2006   right breast ca with lumpectomy and rad tx, left breast ca 2021lumpectomy and rad tx   Colon polyp    Complication of anesthesia    questions about husband who passed in 2012    Cystocele    Depression    Dysrhythmia    Family history of breast cancer    Female bladder prolapse    GERD (gastroesophageal reflux disease)    Goiter    Hyperlipemia    Hypertension    Hypothyroidism    Osteoporosis    Personal history of radiation therapy 2006   right breast ca and left breast 2021   Pneumonia    Procidentia of uterus    Recurrent UTI    Reflux    TIA (transient ischemic attack)    Vaginal atrophy    Assessment: Patient is a 89yo female presenting with back pain. CT and MRI show fractures, neurosurgery plans for surgery on Monday. Patient has a history of afib and was taking Xarelto prior to admission, pharmacy consulted  to transition patient to Heparin infusion for planned surgery on Monday. Last dose of Xarelto was on 5/30.   Baseline HL is 0.32  Goal of Therapy:  Heparin level 0.3-0.7 units/ml aPTT 66-102 seconds Monitor platelets by anticoagulation protocol: Yes   Plan:  --Heparin 2700 units bolus --Heparin 750 units/hr infusion --HL is elevated due to prior Xarelto use, will use aPTT to adjust dosing until HL and aPTT correlate --Next aPTT in 8 hours, check HL daily until correlates --Daily CBC while on Heparin drip  Clovia Cuff, PharmD, BCPS 03/18/2023 5:47 PM

## 2023-03-18 NOTE — ED Notes (Signed)
Admission team DO at [facility]. Speaking with son at [facility]. Converted to NSR HR 66, diltiazem continues at 2.5mg  /hr.

## 2023-03-18 NOTE — Progress Notes (Signed)
PROGRESS NOTE    Sheryl Michael   ZOX:[id number] DOB: 1931-03-16  DOA: 03/17/2023 Date of Service: 03/18/23 PCP: Sheryl Arbour, MD     Brief Narrative / Hospital Course:  [name] is a 89 year old female history of atrial fibrillation, hypertension, hyperlipidemia, hypothyroid. She presents emergency department 03/17/23 from [facility] assisted living for chief concerns of back pain of unknown etiology. Question trauma as she was found on the floor. She does not think that she fell, she thought that she just laid down on the floor because her back was hurting her.  She denies any neurological symptoms currently other than pain. 05/31: CT shows T9, T10 T11 fx. MRI shows only T11 and likely L1 fx are acute, and do not suggest obvious instability. Neurosurgery advised TLSO and T spine upright xrays once in brace to evaluate alignment, can plan on brace only when OOB if Xrays show maintained alignment, Dr Myer Haff will follow  06/01: repeat XR show increasing angulation, planning for surgery Monday. Afib w/ intermittent RVR, IV dilt push ineffective, started dilt gtt and converted to NSR.  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 Neurosurgery following, plan surgical intervention  TSLO brace Symptomatic support: Morphine 4 mg IV every 4 hours as needed for moderate pain, 20 hours ordered; fentanyl 25 mcg IV every 4 hours as needed for severe pain, 15 hours ordered  Afib RVR onset  03/18/23  Paroxysmal Afib Xarelto to continue - transition to heparin gtt if pt/family opt for surgery  Diltiazem 10 mg IV x1 given for HR in 140s-170s, temporarily slowed rate but then back up into 130s-140s, started dilt gtt   Malignant neoplasm of breast Anastrozole 1 mg daily resumed  HLD (hyperlipidemia) Atorvastatin 40 mg daily resumed  Hypothyroidism, acquired Levothyroxine 50 mcg daily resumed  Depression, Dementia Escitalopram 10 mg daily and memantine 5 mg po daily resumed      DVT prophylaxis: Xarelto --> heparin gtt pending surgery  Pertinent IV fluids/nutrition: no continuous IV fluids  Central lines / invasive devices: none  Code Status: FULL CODE ACP documentation reviewed: 03/18/23 none on file VYNCA   Current Admission Status: inpatient  TOC needs / Dispo plan: pend neurosurgery recs w/ PT/OT eval may need SNF placement  Barriers to discharge / significant pending items: pain control, neurosurgery recs w/ PT/OT eval              Subjective / Brief ROS:  Patient reports pain in back and hip Denies CP/SOB.  Pain controlled.  Denies new weakness.  Tolerating diet but difficult to eat d/t pain.  Reports no concerns w/ urination/defecation.   Family Communication: spoke w/ son at bedside     Objective Findings:  Vitals:   03/18/23 1535 03/18/23 1540 03/18/23 1545 03/18/23 1550  BP:   (!) 110/54   Pulse:  68 65 67  Resp: 17 12 19 11   Temp:      TempSrc:  SpO2: 93% 90% 97% 95%  Weight:      Height:        Intake/Output Summary (Last 24 hours) at 03/18/2023 1607 Last data filed at 03/18/2023 1443 Gross per 24 hour  Intake 1000 ml  Output --  Net 1000 ml   Filed Weights   03/17/23 1310  Weight: 53.5 kg    Examination:  Physical Exam Constitutional:      General: She is in acute distress (mild d/t pain).     Appearance: She is not toxic-appearing.  Cardiovascular:     Rate and Rhythm: Tachycardia present. Rhythm irregular.   Pulmonary:     Effort: Pulmonary effort is normal.     Breath sounds: Normal breath sounds.     Comments: Exam limited d/t brace  Musculoskeletal:     Right lower leg: No edema.     Left lower leg: No edema.  Neurological:     Mental Status: She is alert. Mental status is at baseline.          Scheduled Medications:   acidophilus  1 capsule Oral BID   anastrozole  1 mg Oral Daily   atorvastatin  40 mg Oral Daily   cholecalciferol  1,000 Units Oral Daily   docusate sodium  100 mg Oral BID   escitalopram  10 mg Oral Daily   levothyroxine  50 mcg Oral QAC breakfast   memantine  5 mg Oral BID   pantoprazole  40 mg Oral Daily   Rivaroxaban  15 mg Oral Q supper    Continuous Infusions:  sodium chloride 75 mL/hr at 03/18/23 1444   diltiazem (CARDIZEM) infusion 2.5 mg/hr (03/18/23 1525)    PRN Medications:  sodium chloride, acetaminophen **OR** acetaminophen, ondansetron **OR** ondansetron (ZOFRAN) IV, polyethylene glycol  Antimicrobials from admission:  Anti-infectives (From admission, onward)    None           Data Reviewed:  I have personally reviewed the following...  CBC: Recent Labs  Lab 03/17/23 1454 03/18/23 0533  WBC 8.7 5.8  NEUTROABS 7.4  --   HGB 13.8 13.0  HCT 42.2 41.2  MCV 97.5 99.8  PLT 153 136*   Basic Metabolic Panel: Recent Labs  Lab 03/17/23 1454 03/18/23 0533  NA 141 142  K 4.0 4.2  CL 107 109  CO2 24 25  GLUCOSE 108* 84  BUN 19 22  CREATININE 0.91 0.99  CALCIUM 8.9 8.7*   GFR: Estimated Creatinine Clearance: 27.7 mL/min (by C-G formula based on SCr of 0.99 mg/dL). Liver Function Tests: Recent Labs  Lab 03/17/23 1454  AST 25  ALT 19  ALKPHOS 87  BILITOT 1.0  PROT 6.3*  ALBUMIN 3.1*   No results for input(s): "LIPASE", "AMYLASE" in the last 168 hours. No results for input(s): "AMMONIA" in the last 168 hours. Coagulation Profile: No results for input(s): "INR", "PROTIME" in the last 168 hours. Cardiac  Enzymes: No results for input(s): "CKTOTAL", "CKMB", "CKMBINDEX", "TROPONINI" in the last 168 hours. BNP (last 3 results) No results for input(s): "PROBNP" in the last 8760 hours. HbA1C: No results for input(s): "HGBA1C" in the last 72 hours. CBG: No results for input(s): "GLUCAP" in the last 168 hours. Lipid Profile: No results for input(s): "CHOL", "HDL", "LDLCALC", "TRIG", "CHOLHDL", "LDLDIRECT" in the last 72 hours. Thyroid Function Tests: No results for input(s): "TSH", "T4TOTAL", "FREET4", "T3FREE", "THYROIDAB" in the last 72 hours. Anemia Panel: No results for input(s): "VITAMINB12", "FOLATE", "FERRITIN", "TIBC", "IRON", "RETICCTPCT" in the last 72  hours. Most Recent Urinalysis On File:     Component Value Date/Time   COLORURINE YELLOW (A) 05/04/2020 2154   APPEARANCEUR Clear 07/19/2021 1306   LABSPEC 1.012 05/04/2020 2154   PHURINE 5.0 05/04/2020 2154   GLUCOSEU Negative 07/19/2021 1306   HGBUR NEGATIVE 05/04/2020 2154   BILIRUBINUR Negative 07/19/2021 1306   KETONESUR NEGATIVE 05/04/2020 2154   PROTEINUR Negative 07/19/2021 1306   PROTEINUR NEGATIVE 05/04/2020 2154   NITRITE Negative 07/19/2021 1306   NITRITE NEGATIVE 05/04/2020 2154   LEUKOCYTESUR Negative 07/19/2021 1306   LEUKOCYTESUR SMALL (A) 05/04/2020 2154   Sepsis Labs: @LABRCNTIP (procalcitonin:4,lacticidven:4) Microbiology: No results found for this or any previous visit (from the past 240 hour(s)).    Radiology Studies last 3 days: DG Thoracic Spine 2 View  Result Date: 03/18/2023 CLINICAL DATA:  89 year old female with 3 column fracture through the ankylosed thoracic spine at T10 and T11. EXAM: THORACIC SPINE 2 VIEWS COMPARISON:  Thoracic spine CT and MRI 03/17/2023. FINDINGS: Upright appearing thoracic spine AP and lateral views in a brace. There is new/increased lower thoracic kyphotic deformity of about 18 degrees when compared to the thoracic spine CT. The increased angulation appears to be about the  T11 body which is now partially collapsed with anterior wedging. Stable L1 vertebral body hemangioma. Underlying spinal ankylosis. Underlying osteopenia. Negative visible chest and abdominal visceral contours. IMPRESSION: Recent fracture through the ankylosed spine with partial anterior collapse of T11 and increased kyphotic deformity by 18 degrees when compared to the CT on 03/17/2023. Electronically Signed   By: Odessa Fleming M.D.   On: 03/18/2023 08:22   MR THORACIC SPINE WO CONTRAST  Result Date: 03/17/2023 CLINICAL DATA:  Acute fracture through the superior endplate of T11, involving the posterior elements of T10 and spinous process of T9 EXAM: MRI THORACIC SPINE WITHOUT CONTRAST TECHNIQUE: Multiplanar, multisequence MR imaging of the thoracic spine was performed. No intravenous contrast was administered. COMPARISON:  03/17/2023 CT thoracic spine FINDINGS: Alignment:  No listhesis. Vertebrae: Redemonstrated acute fracture through the vertebral body of T11 (series 12, image 9), without significant retropulsion. Increased T2 signal extends into the pedicles and facets bilaterally (series 21, images 6 and 13). Minimal vertebral body height loss. No significant T2 hyperintense signal is seen associated with the T10 posterior element fractures or with the T9 spinous process fracture. Additional T2 hyperintense signal is seen at the superior aspect of L1 (series 21, image 11), which is not appreciated on the prior CT, with a fracture plane extending from the anterior superior aspect to the posterior mid to inferior cortex. No significant retropulsion. Minimal vertebral body height loss. No other acute fracture is seen. No suspicious osseous lesions or evidence of discitis osteomyelitis. Cord: Normal signal and morphology. No evidence of epidural collection. Paraspinal and other soft tissues: Small amount of prevertebral edema anterior to T10 and T11. Small hiatal hernia. Disc levels: No significant spinal canal  stenosis or neural foraminal narrowing. IMPRESSION: 1. Redemonstrated acute fracture through the vertebral body of T11, without significant retropulsion. Edema extends into the pedicles and facets bilaterally. 2. Acute fracture of the superior aspect of L1, which is not appreciated on the prior CT, with minimal vertebral body height loss but no significant retropulsion. 3. No significant T2 hyperintense signal is seen associated with the T10 posterior element fractures or with the T9 spinous process fracture. 4. No significant spinal canal stenosis or neural foraminal narrowing in the thoracic spine. These results were called by telephone at the time of interpretation on  03/17/2023 at 7:40 pm to provider [facility] , who verbally acknowledged these results. Electronically Signed   By: Wiliam Ke M.D.   On: 03/17/2023 19:42   CT Thoracic Spine Wo Contrast  Result Date: 03/17/2023 CLINICAL DATA:  Back trauma, no prior imaging (Age >= 16y). EXAM: CT THORACIC AND LUMBAR SPINE WITHOUT CONTRAST TECHNIQUE: Multidetector CT imaging of the thoracic and lumbar spine was performed without contrast. Multiplanar CT image reconstructions were also generated. RADIATION DOSE REDUCTION: This exam was performed according to the departmental dose-optimization program which includes automated exposure control, adjustment of the mA and/or kV according to patient size and/or use of iterative reconstruction technique. COMPARISON:  None Available. FINDINGS: CT THORACIC SPINE FINDINGS Alignment: Normal. Vertebrae: Acute fracture through the superior endplate of T11 extending to involve the posterior elements of T10 bilaterally in the spinous process of T9 (sagittal image 29 series 6). No evidence of epidural hematoma. Mild paraspinal fat stranding. Underlying diffuse idiopathic skeletal hyperostosis. Paraspinal and other soft tissues: Aortic atherosclerosis. Coronary artery calcifications. Small hiatal hernia. Disc levels: No spinal  canal stenosis or neural foraminal narrowing in the thoracic spine. CT LUMBAR SPINE FINDINGS Segmentation: Standard. Alignment: Normal. Vertebrae: Normal vertebral body heights.  No acute fracture. Paraspinal and other soft tissues: Sigmoid diverticulosis. Disc levels: Disc bulge and facet arthropathy contribute to mild spinal canal stenosis at L4-5. IMPRESSION: 1. Acute fracture through the superior endplate of T11 extending to involve the posterior elements of T10 bilaterally and the spinous process of T9. No evidence of epidural hematoma. 2. No acute fracture or traumatic listhesis in the lumbar spine. Aortic Atherosclerosis (ICD10-I70.0). These results were called by telephone at the time of interpretation on 03/17/2023 at 2:59 pm to provider [facility] , who verbally acknowledged these results. Electronically Signed   By: Orvan Falconer M.D.   On: 03/17/2023 15:03   CT Lumbar Spine Wo Contrast  Result Date: 03/17/2023 CLINICAL DATA:  Back trauma, no prior imaging (Age >= 16y). EXAM: CT THORACIC AND LUMBAR SPINE WITHOUT CONTRAST TECHNIQUE: Multidetector CT imaging of the thoracic and lumbar spine was performed without contrast. Multiplanar CT image reconstructions were also generated. RADIATION DOSE REDUCTION: This exam was performed according to the departmental dose-optimization program which includes automated exposure control, adjustment of the mA and/or kV according to patient size and/or use of iterative reconstruction technique. COMPARISON:  None Available. FINDINGS: CT THORACIC SPINE FINDINGS Alignment: Normal. Vertebrae: Acute fracture through the superior endplate of T11 extending to involve the posterior elements of T10 bilaterally in the spinous process of T9 (sagittal image 29 series 6). No evidence of epidural hematoma. Mild paraspinal fat stranding. Underlying diffuse idiopathic skeletal hyperostosis. Paraspinal and other soft tissues: Aortic atherosclerosis. Coronary artery  calcifications. Small hiatal hernia. Disc levels: No spinal canal stenosis or neural foraminal narrowing in the thoracic spine. CT LUMBAR SPINE FINDINGS Segmentation: Standard. Alignment: Normal. Vertebrae: Normal vertebral body heights.  No acute fracture. Paraspinal and other soft tissues: Sigmoid diverticulosis. Disc levels: Disc bulge and facet arthropathy contribute to mild spinal canal stenosis at L4-5. IMPRESSION: 1. Acute fracture through the superior endplate of T11 extending to involve the posterior elements of T10 bilaterally and the spinous process of T9. No evidence of epidural hematoma. 2. No acute fracture or traumatic listhesis in the lumbar spine. Aortic Atherosclerosis (ICD10-I70.0). These results were called by telephone at the time of interpretation on 03/17/2023 at 2:59 pm to provider [facility] , who verbally acknowledged these results. Electronically Signed   By: Zollie Beckers  Wiggins M.D.   On: 03/17/2023 15:03             LOS: 1 day       Sunnie Nielsen, DO [facility] 03/18/2023, 4:07 PM    Dictation software may have been used to generate the above note. Typos may occur and escape review in typed/dictated notes. Please contact Dr Lyn Hollingshead directly for clarity if needed.  Staff may message me via secure chat in Epic  but this may not receive an immediate response,  please page me for urgent matters!  If 7PM-7AM, please contact night coverage [email or web address]

## 2023-03-18 NOTE — ED Notes (Signed)
Pt awake, interactive, calm, TLSO brace in place. C/o pain, worse with movement. Denies sob, nausea or other sx.

## 2023-03-18 NOTE — ED Notes (Signed)
Back from xray, resting, calm, interactive. Returned to O2, IV and monitor.

## 2023-03-18 NOTE — Evaluation (Signed)
Clinical/Bedside Swallow Evaluation Patient Details  Name: Sheryl Michael MRN: [id number] Date of Birth: June 06, 1931  Today's Date: 03/18/2023 Time: SLP Start Time (ACUTE ONLY): 1115 SLP Stop Time (ACUTE ONLY): 1125 SLP Time Calculation (min) (ACUTE ONLY): 10 min  Past Medical History:  Past Medical History:  Diagnosis Date   Arthritis    Atrial fibrillation (HCC)    Breast cancer (HCC) 2006   right breast ca with lumpectomy and rad tx, left breast ca 2021lumpectomy and rad tx   Colon polyp    Complication of anesthesia    questions about husband who passed in 2012    Cystocele    Depression    Dysrhythmia    Family history of breast cancer    Female bladder prolapse    GERD (gastroesophageal reflux disease)    Goiter    Hyperlipemia    Hypertension    Hypothyroidism    Osteoporosis    Personal history of radiation therapy 2006   right breast ca and left breast 2021   Pneumonia    Procidentia of uterus    Recurrent UTI    Reflux    TIA (transient ischemic attack)    Vaginal atrophy    Past Surgical History:  Past Surgical History:  Procedure Laterality Date   APPENDECTOMY     BREAST BIOPSY Right 2006   breast cancer   BREAST BIOPSY Left 07/17/2020   [place] bx, vision marker, IMC with lobular features   BREAST LUMPECTOMY Right 2006   positive   BREAST LUMPECTOMY Left 08/03/2020   left breast invasive lobular carcinoma, negative LNs   BREAST SURGERY Right    lumpectomy   CATARACT EXTRACTION W/ INTRAOCULAR LENS  IMPLANT, BILATERAL     COLONOSCOPY     CYSTOCELE REPAIR N/A 12/21/2015   Procedure: ANTERIOR REPAIR (CYSTOCELE);  Surgeon: Herold Harms, MD;  Location: [facility] ORS;  Service: Gynecology;  Laterality: N/A;   DILATION AND CURETTAGE OF UTERUS     ELECTROMAGNETIC NAVIGATION BROCHOSCOPY Right 12/13/2018   Procedure: ELECTROMAGNETIC NAVIGATION BRONCHOSCOPY;  Surgeon: Erin Fulling, MD;  Location: [facility] ORS;  Service: Cardiopulmonary;  Laterality: Right;   EYE  SURGERY     PARTIAL MASTECTOMY WITH NEEDLE LOCALIZATION AND AXILLARY SENTINEL LYMPH NODE BX Left 08/03/2020   Procedure: PARTIAL MASTECTOMY WITH Radio Frequency tag AND AXILLARY SENTINEL LYMPH NODE BX;  Surgeon: Carolan Shiver, MD;  Location: [facility] ORS;  Service: General;  Laterality: Left;   VAGINAL HYSTERECTOMY Bilateral 12/21/2015   Procedure: TVH BSO;  Surgeon: Herold Harms, MD;  Location: [facility] ORS;  Service: Gynecology;  Laterality: Bilateral;   HPI:  [name] is a 89 year old female history of atrial fibrillation, hypertension, hyperlipidemia, hypothyroid, who presents emergency department from [facility] on 03/17/2023 for chief concerns of back pain of unknown etiology. MRI T spine 03/17/2023 IMPRESSION: 1. Redemonstrated acute fracture through the vertebral body of T11, without significant retropulsion. Edema extends into the pedicles and facets bilaterally. 2. Acute fracture of the superior aspect of L1, which is not appreciated on the prior CT, with minimal vertebral body height loss but no significant retropulsion.  CT Thoracic Spine also mentioned a small hiatal hernia.    Assessment / Plan / Recommendation  Clinical Impression  Pt with no known history of dysphagia, is currently on 2liters O2 via [place] d/t administration of pain medicines and sleepiness. Pt is currently in a TLSO. She appears fatigued and sleepy but was able to arouse enough for several sips of  thin liquids via straw. Pt's positioning in bed is suboptimal for PO consumption as she is laying on her right side and is somewhat reclined d/t pain. Per nursing report, pt was able to consume her medicine whole with applesauce whle in this position without overt s/s of aspiration. In this position, pt consumed 3 sips of thin liquids via straw with no overt s/s of aspiration. At this time, current diet appears appropriate but she remains at high of aspiration given limitations of position as well as administration of  sedating pain medications. Extra care and deligence must be taken to position pt as upright as tolerated for all PO consumption especially in like of her hiatal hernia. ST to sign off at this time. SLP Visit Diagnosis: Dysphagia, unspecified (R13.10)    Aspiration Risk  Mild aspiration risk (d/t sedating pain medications and back brace and pain)    Diet Recommendation Regular;Thin liquid   Liquid Administration via: Straw Medication Administration: Whole meds with puree Supervision: Staff to assist with self feeding;Full supervision/cueing for compensatory strategies Compensations: Minimize environmental distractions;Slow rate;Small sips/bites    Other  Recommendations Oral Care Recommendations: Oral care BID    Recommendations for follow up therapy are one component of a multi-disciplinary discharge planning process, led by the attending physician.  Recommendations may be updated based on patient status, additional functional criteria and insurance authorization.  Follow up Recommendations No SLP follow up      Assistance Recommended at Discharge  N/A  Functional Status Assessment Patient has not had a recent decline in their functional status  Frequency and Duration   N/A         Prognosis   N/A     Swallow Study   General Date of Onset: 03/17/23 HPI: [name] is a 89 year old female history of atrial fibrillation, hypertension, hyperlipidemia, hypothyroid, who presents emergency department from [facility] on 03/17/2023 for chief concerns of back pain of unknown etiology. MRI T spine 03/17/2023 IMPRESSION: 1. Redemonstrated acute fracture through the vertebral body of T11, without significant retropulsion. Edema extends into the pedicles and facets bilaterally. 2. Acute fracture of the superior aspect of L1, which is not appreciated on the prior CT, with minimal vertebral body height loss but no significant retropulsion.  CT Thoracic Spine also mentioned a small hiatal  hernia. Type of Study: Bedside Swallow Evaluation Previous Swallow Assessment: none in chart Diet Prior to this Study: Regular;Thin liquids (Level 0) Temperature Spikes Noted: No Respiratory Status: Nasal cannula (2 liters d/t administration of sedating pain medicines) History of Recent Intubation: No Behavior/Cognition: Cooperative;Pleasant mood Oral Cavity Assessment: Within Functional Limits Oral Care Completed by SLP: No Oral Cavity - Dentition: Adequate natural dentition Self-Feeding Abilities: Total assist Patient Positioning: Postural control interferes with function;Partially reclined (partially side laying) Baseline Vocal Quality: Low vocal intensity (suspect d/t pain) Volitional Cough: Weak (suspect d/t pain) Volitional Swallow: Able to elicit    Oral/Motor/Sensory Function Overall Oral Motor/Sensory Function: Within functional limits   Ice Chips Ice chips: Not tested   Thin Liquid Thin Liquid: Within functional limits Presentation: Straw    Nectar Thick Nectar Thick Liquid: Not tested   Honey Thick Honey Thick Liquid: Not tested   Puree Puree: Within functional limits Presentation: Spoon   Solid           Emin Foree B. Dreama Saa, M.S., CCC-SLP, Tree surgeon Certified Brain Injury Specialist [facility]  [facility] Rehabilitation Services Office [phone] Ascom [phone] Fax [phone]

## 2023-03-18 NOTE — ED Notes (Signed)
SLP at BS 

## 2023-03-18 NOTE — Hospital Course (Addendum)
Ms. Yacine Gorzynski is a 89 year old female history of atrial fibrillation, hypertension, hyperlipidemia, hypothyroid. She presents emergency department 03/17/23 from [facility] assisted living for chief concerns of back pain of unknown etiology. Question trauma as she was found on the floor. She does not think that she fell, she thought that she just laid down on the floor because her back was hurting her.  She denies any neurological symptoms currently other than pain. 05/31: CT shows T9, T10 T11 fx. MRI shows only T11 and likely L1 fx are acute, and do not suggest obvious instability. Neurosurgery advised TLSO and T spine upright xrays once in brace to evaluate alignment, can plan on brace only when OOB if Xrays show maintained alignment, Dr Myer Haff will follow  06/01: repeat XR show increasing angulation, planning for surgery Monday. Afib w/ intermittent RVR, IV dilt push ineffective, started dilt gtt and converted to NSR.  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 Neurosurgery following, plan surgical intervention  TSLO brace Symptomatic support: Morphine 4 mg IV every 4 hours as needed for moderate pain, 20 hours ordered; fentanyl 25 mcg IV every 4 hours as needed for severe pain, 15 hours ordered  Afib RVR onset 03/18/23  Paroxysmal Afib Xarelto to continue - transition to heparin gtt if pt/family opt for surgery  Diltiazem 10 mg IV x1 given for HR in 140s-170s, temporarily slowed rate but then back  up into 130s-140s, started dilt gtt   Malignant neoplasm of breast Anastrozole 1 mg daily resumed  HLD (hyperlipidemia) Atorvastatin 40 mg daily resumed  Hypothyroidism, acquired Levothyroxine 50 mcg daily resumed  Depression, Dementia Escitalopram 10 mg daily and memantine 5 mg po daily resumed      DVT prophylaxis: Xarelto --> heparin gtt pending surgery  Pertinent IV fluids/nutrition: no continuous IV fluids  Central lines / invasive devices: none  Code Status: FULL CODE ACP documentation reviewed: 03/18/23 none on file [facility]   Current Admission Status: inpatient  TOC needs / Dispo plan: pend neurosurgery recs w/ PT/OT eval may need SNF placement  Barriers to discharge / significant pending items: pain control, neurosurgery recs w/ PT/OT eval

## 2023-03-18 NOTE — ED Notes (Signed)
This RN and Marlena Clipper changed patient's brief and provided hygiene care prior to transporting patient to inpatient floor.

## 2023-03-18 NOTE — ED Notes (Signed)
Dilt drip decreased d/t BP low, SBP 88, pt lying on R side for comfort, will monitor. Remains alert, NAD, calm, interactive. Son at [facility].

## 2023-03-18 NOTE — ED Notes (Signed)
Sleeping, was NSR 78. Still sleeping, and converted back into afib RVR after morning duoneb at 1054. Morning cardizem PO given at 1050. Admitting MD notified.

## 2023-03-18 NOTE — ED Notes (Signed)
HR sustained above 130, afib 130-155

## 2023-03-19 ENCOUNTER — Encounter: Payer: Self-pay | Admitting: Osteopathic Medicine

## 2023-03-19 DIAGNOSIS — M532X4 Spinal instabilities, thoracic region: Secondary | ICD-10-CM | POA: Diagnosis not present

## 2023-03-19 DIAGNOSIS — M40294 Other kyphosis, thoracic region: Secondary | ICD-10-CM | POA: Diagnosis not present

## 2023-03-19 DIAGNOSIS — M40209 Unspecified kyphosis, site unspecified: Secondary | ICD-10-CM

## 2023-03-19 DIAGNOSIS — S22008A Other fracture of unspecified thoracic vertebra, initial encounter for closed fracture: Secondary | ICD-10-CM

## 2023-03-19 DIAGNOSIS — S32019A Unspecified fracture of first lumbar vertebra, initial encounter for closed fracture: Secondary | ICD-10-CM | POA: Diagnosis not present

## 2023-03-19 DIAGNOSIS — S22089A Unspecified fracture of T11-T12 vertebra, initial encounter for closed fracture: Secondary | ICD-10-CM | POA: Diagnosis not present

## 2023-03-19 LAB — HEPARIN LEVEL (UNFRACTIONATED): Heparin Unfractionated: 0.18 IU/mL — ABNORMAL LOW (ref 0.30–0.70)

## 2023-03-19 LAB — APTT
aPTT: 88 seconds — ABNORMAL HIGH (ref 24–36)
aPTT: 65 seconds — ABNORMAL HIGH (ref 24–36)
aPTT: 37 seconds — ABNORMAL HIGH (ref 24–36)

## 2023-03-19 LAB — CBC
HCT: 46.8 % — ABNORMAL HIGH (ref 36.0–46.0)
Hemoglobin: 14.3 g/dL (ref 12.0–15.0)
MCH: 31.6 pg (ref 26.0–34.0)
MCHC: 30.6 g/dL (ref 30.0–36.0)
MCV: 103.3 fL — ABNORMAL HIGH (ref 80.0–100.0)
Platelets: 119 10*3/uL — ABNORMAL LOW (ref 150–400)
RBC: 4.53 MIL/uL (ref 3.87–5.11)
RDW: 14.8 % (ref 11.5–15.5)
WBC: 6.6 10*3/uL (ref 4.0–10.5)
nRBC: 0 % (ref 0.0–0.2)

## 2023-03-19 MED ORDER — HEPARIN BOLUS VIA INFUSION
1600.0000 [IU] | Freq: Once | INTRAVENOUS | Status: AC
  Administered 2023-03-19: 1600 [IU] via INTRAVENOUS
  Filled 2023-03-19: qty 1600

## 2023-03-19 MED ORDER — DILTIAZEM HCL 25 MG/5ML IV SOLN
10.0000 mg | Freq: Once | INTRAVENOUS | Status: AC
  Administered 2023-03-19: 10 mg via INTRAVENOUS
  Filled 2023-03-19: qty 5

## 2023-03-19 MED ORDER — SIMETHICONE 80 MG PO CHEW
80.0000 mg | CHEWABLE_TABLET | Freq: Four times a day (QID) | ORAL | Status: DC | PRN
  Administered 2023-03-19 – 2023-03-21 (×2): 80 mg via ORAL
  Filled 2023-03-19 (×2): qty 1

## 2023-03-19 NOTE — Progress Notes (Signed)
ANTICOAGULATION CONSULT NOTE  Pharmacy Consult for Heparin Drip Indication: atrial fibrillation  Allergies  Allergen Reactions   Bactrim [Sulfamethoxazole-Trimethoprim] Hives   Iodinated Contrast Media Anaphylaxis   Codeine Nausea And Vomiting   Erythromycin Ethylsuccinate Other (See Comments)    Unknown    Phenobarbital Other (See Comments)    Feeling funny, nervous   Ciprofloxacin Rash   Penicillins Rash    Patient Measurements: Height: 5' (152.4 cm) Weight: 53.5 kg (118 lb) IBW/kg (Calculated) : 45.5 Heparin Dosing Weight: 53.5 kg  Vital Signs: Temp: 98.5 F (36.9 C) (06/01 2256) Temp Source: Oral (06/01 2256) BP: 132/45 (06/01 2256) Pulse Rate: 60 (06/01 2256)  Labs: Recent Labs    03/17/23 1454 03/18/23 0533 03/18/23 1714 03/19/23 0108  HGB 13.8 13.0  --   --   HCT 42.2 41.2  --   --   PLT 153 136*  --   --   APTT  --   --  33 88*  LABPROT  --   --  14.7  --   INR  --   --  1.1  --   HEPARINUNFRC  --   --  0.32  --   CREATININE 0.91 0.99  --   --   TROPONINIHS 7 9  --   --      Estimated Creatinine Clearance: 27.7 mL/min (by C-G formula based on SCr of 0.99 mg/dL).   Medical History: Past Medical History:  Diagnosis Date   Arthritis    Atrial fibrillation (HCC)    Breast cancer (HCC) 2006   right breast ca with lumpectomy and rad tx, left breast ca 2021lumpectomy and rad tx   Colon polyp    Complication of anesthesia    questions about husband who passed in 2012    Cystocele    Depression    Dysrhythmia    Family history of breast cancer    Female bladder prolapse    GERD (gastroesophageal reflux disease)    Goiter    Hyperlipemia    Hypertension    Hypothyroidism    Osteoporosis    Personal history of radiation therapy 2006   right breast ca and left breast 2021   Pneumonia    Procidentia of uterus    Recurrent UTI    Reflux    TIA (transient ischemic attack)    Vaginal atrophy    Assessment: Patient is a 89yo female  presenting with back pain. CT and MRI show fractures, neurosurgery plans for surgery on Monday. Patient has a history of afib and was taking Xarelto prior to admission, pharmacy consulted to transition patient to Heparin infusion for planned surgery on Monday. Last dose of Xarelto was on 5/30.   Baseline HL is 0.32  Goal of Therapy:  Heparin level 0.3-0.7 units/ml aPTT 66-102 seconds Monitor platelets by anticoagulation protocol: Yes   06/02 0108 aPTT 88, therapeutic x 1  Plan:  --Continue heparin infusion at 750 units/hr --HL is elevated due to prior Xarelto use, will use aPTT to adjust dosing until HL and aPTT correlate --Next aPTT in 8 hours, check HL daily until correlates --Daily CBC while on Heparin drip  Otelia Sergeant, PharmD, [facility] 03/19/2023 2:03 AM

## 2023-03-19 NOTE — Progress Notes (Signed)
ANTICOAGULATION CONSULT NOTE  Pharmacy Consult for Heparin Drip Indication: atrial fibrillation  Allergies  Allergen Reactions   Bactrim [Sulfamethoxazole-Trimethoprim] Hives   Iodinated Contrast Media Anaphylaxis   Codeine Nausea And Vomiting   Erythromycin Ethylsuccinate Other (See Comments)    Unknown    Phenobarbital Other (See Comments)    Feeling funny, nervous   Ciprofloxacin Rash   Penicillins Rash    Patient Measurements: Height: 5' (152.4 cm) Weight: 53.5 kg (118 lb) IBW/kg (Calculated) : 45.5 Heparin Dosing Weight: 53.5 kg  Vital Signs: Temp: 99 F (37.2 C) (06/02 1714) BP: 131/52 (06/02 1714) Pulse Rate: 73 (06/02 1714)  Labs: Recent Labs    03/17/23 1454 03/18/23 0533 03/18/23 1714 03/18/23 1714 03/19/23 0108 03/19/23 0433 03/19/23 0915 03/19/23 1757  HGB 13.8 13.0  --   --   --  14.3  --   --   HCT 42.2 41.2  --   --   --  46.8*  --   --   PLT 153 136*  --   --   --  119*  --   --   APTT  --   --  33   < > 88*  --  65* 37*  LABPROT  --   --  14.7  --   --   --   --   --   INR  --   --  1.1  --   --   --   --   --   HEPARINUNFRC  --   --  0.32  --   --   --   --  0.18*  CREATININE 0.91 0.99  --   --   --   --   --   --   TROPONINIHS 7 9  --   --   --   --   --   --    < > = values in this interval not displayed.     Estimated Creatinine Clearance: 27.7 mL/min (by C-G formula based on SCr of 0.99 mg/dL).   Medical History: Past Medical History:  Diagnosis Date   Arthritis    Atrial fibrillation (HCC)    Breast cancer (HCC) 2006   right breast ca with lumpectomy and rad tx, left breast ca 2021lumpectomy and rad tx   Colon polyp    Complication of anesthesia    questions about husband who passed in 2012    Cystocele    Depression    Dysrhythmia    Family history of breast cancer    Female bladder prolapse    GERD (gastroesophageal reflux disease)    Goiter    Hyperlipemia    Hypertension    Hypothyroidism    Osteoporosis     Personal history of radiation therapy 2006   right breast ca and left breast 2021   Pneumonia    Procidentia of uterus    Recurrent UTI    Reflux    TIA (transient ischemic attack)    Vaginal atrophy    Assessment: Patient is a 89yo female presenting with back pain. CT and MRI show fractures, neurosurgery plans for surgery on Monday. Patient has a history of afib and was taking Xarelto prior to admission, pharmacy consulted to transition patient to Heparin infusion for planned surgery on Monday. Last dose of Xarelto was on 5/30.   Baseline HL is 0.32  Goal of Therapy:  Heparin level 0.3-0.7 units/ml aPTT 66-102 seconds Monitor platelets by anticoagulation protocol: Yes  06/02 0108 aPTT 88, therapeutic x 1 06/02 0915 aPTT 65,  barely subtherapeutic 06/02 1757 aPTT 37, HL 0.18 Subtherapeutic   Plan:  06/02 1757 aPTT 37 and HL 0.18, subtherapeutic --Heparin 1600 units bolus --Increase heparin infusion to 1000 units/hr --HL and aPTT look to be correlated now, will check both one more time to be sure. --Next aPTT/HL in 8 hours --Daily CBC while on Heparin drip  Clovia Cuff, PharmD, BCPS 03/19/2023 6:55 PM

## 2023-03-19 NOTE — Plan of Care (Signed)

## 2023-03-19 NOTE — Progress Notes (Addendum)
       CROSS COVER NOTE  NAME: Sheryl Michael MRN: [id number] DOB : [birth year]    Concern as stated by nurse / staff   Rapid A-fib rate in the 130s to 140s.  Patient currently on diltiazem infusion however it was held up prior to transfer to the floor not restarted     Pertinent findings on chart review:    03/19/2023    2:46 AM 03/19/2023    2:14 AM 03/18/2023   10:56 PM  Vitals with BMI  Systolic 138 143 409  Diastolic 86 94 45  Pulse 147 124 60     Assessment and  Interventions   Assessment: Rapid A-fib Plan: Repeat diltiazem bolus 10 mg IV x 1 and resume diltiazem infusion per prior orders X X

## 2023-03-19 NOTE — Progress Notes (Signed)
   Neurosurgery Progress Note  History: Sheryl Michael is here for complex thoracic fracture consistent with a 3 column injury, as well as L1 fracture.  She has underlying DISH.  03/19/2023: Pain is better controlled but not able to sit upright due to pain.  Physical Exam: Vitals:   03/19/23 0827 03/19/23 0944  BP: (!) 121/46   Pulse: 63   Resp: 20 18  Temp: 98.7 F (37.1 C)   SpO2: 95%     AA Ox3 CNI  Strength:5/5 throughout BLE  Data:  Other tests/results:  Standing Xrays 03/18/2023 IMPRESSION: Recent fracture through the ankylosed spine with partial anterior collapse of T11 and increased kyphotic deformity by 18 degrees when compared to the CT on 03/17/2023.     Electronically Signed   By: Odessa Fleming M.D.   On: 03/18/2023 08:22    Assessment/Plan:  Sheryl Michael has an unstable fracture through the T11 vertebral body that is consistent with a 3 column injury.  She also has an L1 fracture.  On her x-rays, she has substantial increase in her kyphosis at T11 implying that she has instability through this fracture.    Given the increase in kyphosis on her standing x-rays, I have recommended surgical fixation to address her thoracic instability from this 3 column fracture.  This will involve instrumentation from T8-L2.  There is a possibility of needing to go to L3, but we will try to stop at L2 depending on the strength of the fixation.  Given her advanced age and likely poor mineralization, we will be prepared to cement augment the screws.  I discussed the planned procedure at length with the patient, including the risks, benefits, alternatives, and indications. The risks discussed include but are not limited to bleeding, infection, need for reoperation, spinal fluid leak, stroke, vision loss, anesthetic complication, coma, paralysis, and even death. I also described in detail that improvement was not guaranteed.  The patient and family expressed understanding of these risks,  and asked that we proceed with surgery. I described the surgery in layman's terms, and gave ample opportunity for questions, which were answered to the best of my ability.  She will remain on bedrest with brace on until surgery.  Venetia Night MD, [facility] Department of Neurosurgery

## 2023-03-19 NOTE — Progress Notes (Signed)
ANTICOAGULATION CONSULT NOTE  Pharmacy Consult for Heparin Drip Indication: atrial fibrillation  Allergies  Allergen Reactions   Bactrim [Sulfamethoxazole-Trimethoprim] Hives   Iodinated Contrast Media Anaphylaxis   Codeine Nausea And Vomiting   Erythromycin Ethylsuccinate Other (See Comments)    Unknown    Phenobarbital Other (See Comments)    Feeling funny, nervous   Ciprofloxacin Rash   Penicillins Rash    Patient Measurements: Height: 5' (152.4 cm) Weight: 53.5 kg (118 lb) IBW/kg (Calculated) : 45.5 Heparin Dosing Weight: 53.5 kg  Vital Signs: Temp: 98.7 F (37.1 C) (06/02 0827) Temp Source: Oral (06/02 0329) BP: 121/46 (06/02 0827) Pulse Rate: 63 (06/02 0827)  Labs: Recent Labs    03/17/23 1454 03/18/23 0533 03/18/23 1714 03/19/23 0108 03/19/23 0433 03/19/23 0915  HGB 13.8 13.0  --   --  14.3  --   HCT 42.2 41.2  --   --  46.8*  --   PLT 153 136*  --   --  119*  --   APTT  --   --  33 88*  --  65*  LABPROT  --   --  14.7  --   --   --   INR  --   --  1.1  --   --   --   HEPARINUNFRC  --   --  0.32  --   --   --   CREATININE 0.91 0.99  --   --   --   --   TROPONINIHS 7 9  --   --   --   --      Estimated Creatinine Clearance: 27.7 mL/min (by C-G formula based on SCr of 0.99 mg/dL).   Medical History: Past Medical History:  Diagnosis Date   Arthritis    Atrial fibrillation (HCC)    Breast cancer (HCC) 2006   right breast ca with lumpectomy and rad tx, left breast ca 2021lumpectomy and rad tx   Colon polyp    Complication of anesthesia    questions about husband who passed in 2012    Cystocele    Depression    Dysrhythmia    Family history of breast cancer    Female bladder prolapse    GERD (gastroesophageal reflux disease)    Goiter    Hyperlipemia    Hypertension    Hypothyroidism    Osteoporosis    Personal history of radiation therapy 2006   right breast ca and left breast 2021   Pneumonia    Procidentia of uterus    Recurrent UTI     Reflux    TIA (transient ischemic attack)    Vaginal atrophy    Assessment: Patient is a 89yo female presenting with back pain. CT and MRI show fractures, neurosurgery plans for surgery on Monday. Patient has a history of afib and was taking Xarelto prior to admission, pharmacy consulted to transition patient to Heparin infusion for planned surgery on Monday. Last dose of Xarelto was on 5/30.   Baseline HL is 0.32  Goal of Therapy:  Heparin level 0.3-0.7 units/ml aPTT 66-102 seconds Monitor platelets by anticoagulation protocol: Yes   06/02 0108 aPTT 88, therapeutic x 1 06/02 0915 aPTT 65,  barely subtherapeutic   Plan:  06/02 0915 aPTT 65,  barely subtherapeutic --Increase heparin infusion to 850 units/hr --HL is elevated due to prior Xarelto use, will use aPTT to adjust dosing until HL and aPTT correlate --Next aPTT in 8 hours, check HL daily until correlates --  Daily CBC while on Heparin drip  Bari Mantis PharmD Clinical Pharmacist 03/19/2023

## 2023-03-19 NOTE — Progress Notes (Signed)
PROGRESS NOTE    Sheryl Michael   ZOX:[id number] DOB: 1930/10/23  DOA: 03/17/2023 Date of Service: 03/19/23 PCP: Sheryl Arbour, MD     Brief Narrative / Hospital Course:  [name] is a 89 year old female history of atrial fibrillation, hypertension, hyperlipidemia, hypothyroid. She presents emergency department 03/17/23 from [facility] assisted living for chief concerns of back pain of unknown etiology. Question trauma as she was found on the floor. She does not think that she fell, she thought that she just laid down on the floor because her back was hurting her.  She denies any neurological symptoms currently other than pain. 05/31: CT shows T9, T10 T11 fx. MRI shows only T11 and likely L1 fx are acute, and do not suggest obvious instability. Neurosurgery advised TLSO and T spine upright xrays once in brace to evaluate alignment, can plan on brace only when OOB if Xrays show maintained alignment, Dr Myer Haff will follow  06/01: repeat XR show increasing angulation, planning for surgery Monday. Afib w/ intermittent RVR, IV dilt push ineffective, started dilt gtt and converted to NSR. Paused gtt , increased po dose dilt, no further events until...  06/02: Again in rapid Afib early AM, restarted dilt gtt. Improved. Will leave on drip for now. 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 Neurosurgery following, plan surgical intervention  TSLO  brace Symptomatic support: Morphine 4 mg IV every 4 hours as needed for moderate pain, 20 hours ordered; fentanyl 25 mcg IV every 4 hours as needed for severe pain, 15 hours ordered   Afib RVR onset 03/18/23  Paroxysmal Afib Xarelto transitioned to heparin gtt in anticipation of surgery  paused gtt but back into rapid Afib, so will leave dilt gtt running unless brady/hypotension Given would have to remove TLSO brace, w/ unstable spinal fracture risk deemed greater than benefit for echo    Malignant neoplasm of breast Anastrozole 1 mg daily resumed   HLD (hyperlipidemia) Atorvastatin 40 mg daily resumed   Hypothyroidism, acquired Levothyroxine 50 mcg daily resumed   Depression, Dementia Escitalopram 10 mg daily and memantine 5 mg po daily resumed           DVT prophylaxis: heparin gtt pending surgery  Pertinent IV fluids/nutrition: no continuous IV fluids  Central lines / invasive devices: none   Code Status: FULL CODE ACP documentation reviewed: 03/18/23 none on file VYNCA    Current Admission Status: inpatient  TOC needs / Dispo plan: pend neurosurgery recs w/ PT/OT eval following procedure, may need SNF placement  Barriers to discharge / significant pending items: pain control, neurosurgery recs w/ PT/OT eval, expect will be medically stable mid-week                                 Subjective / Brief ROS:  Patient reports  pain in back and hip Denies CP/SOB.  Pain controlled.  Denies new weakness.  Tolerating diet but difficult to eat d/t pain.  Reports no concerns w/ urination/defecation.   Family Communication: spoke w/ son at bedside     Objective Findings:  Vitals:   03/19/23 1014 03/19/23 1210 03/19/23 1240 03/19/23 1412  BP:      Pulse:      Resp: 18 18 18 18   Temp:      TempSrc:      SpO2:      Weight:      Height:        Intake/Output Summary (Last 24 hours) at 03/19/2023 1427 Last data filed at 03/18/2023 1443 Gross per 24 hour   Intake 1000 ml  Output --  Net 1000 ml   Filed Weights   03/17/23 1310  Weight: 53.5 kg    Examination:  Physical Exam Constitutional:      General: She is in acute distress (mild d/t pain).     Appearance: She is not toxic-appearing.  Cardiovascular:     Rate and Rhythm: Normal rate. Rhythm irregular.  Pulmonary:     Effort: Pulmonary effort is normal.     Breath sounds: Normal breath sounds.     Comments: Exam limited d/t brace  Musculoskeletal:     Right lower leg: No edema.     Left lower leg: No edema.  Neurological:     Mental Status: She is alert. Mental status is at baseline.          Scheduled Medications:   acidophilus  1 capsule Oral BID   anastrozole  1 mg Oral Daily   atorvastatin  40 mg Oral Daily   cholecalciferol  1,000 Units Oral Daily   diltiazem  180 mg Oral Daily   docusate sodium  100 mg Oral BID   escitalopram  10 mg Oral Daily   levothyroxine  50 mcg Oral QAC breakfast   lidocaine  1 patch Transdermal Q24H   memantine  5 mg Oral BID   pantoprazole  40 mg Oral Daily    Continuous Infusions:  sodium chloride 75 mL/hr at 03/19/23 0314   diltiazem (CARDIZEM) infusion Stopped (03/19/23 0944)   heparin 850 Units/hr (03/19/23 1057)    PRN Medications:  sodium chloride, acetaminophen **OR** acetaminophen, fentaNYL (SUBLIMAZE) injection, ondansetron **OR** ondansetron (ZOFRAN) IV, polyethylene glycol  Antimicrobials from admission:  Anti-infectives (From admission, onward)    None           Data Reviewed:  I have personally reviewed the following...  CBC: Recent Labs  Lab 03/17/23 1454 03/18/23 0533 03/19/23 0433  WBC 8.7 5.8 6.6  NEUTROABS 7.4  --   --   HGB 13.8 13.0 14.3  HCT 42.2 41.2 46.8*  MCV 97.5 99.8 103.3*  PLT 153 136* 119*   Basic Metabolic Panel: Recent Labs  Lab 03/17/23 1454 03/18/23 0533  NA 141 142  K 4.0 4.2  CL 107 109  CO2 24 25  GLUCOSE 108* 84  BUN 19 22  CREATININE 0.91 0.99  CALCIUM  8.9 8.7*   GFR: Estimated Creatinine Clearance: 27.7 mL/min (by C-G formula based on SCr of 0.99 mg/dL). Liver Function Tests: Recent Labs  Lab 03/17/23 1454  AST 25  ALT 19  ALKPHOS 87  BILITOT 1.0  PROT 6.3*  ALBUMIN 3.1*   No results for input(s): "LIPASE", "AMYLASE" in the last 168 hours. No results for input(s): "AMMONIA" in the last 168 hours. Coagulation  Profile: Recent Labs  Lab 03/18/23 1714  INR 1.1   Cardiac Enzymes: No results for input(s): "CKTOTAL", "CKMB", "CKMBINDEX", "TROPONINI" in the last 168 hours. BNP (last 3 results) No results for input(s): "PROBNP" in the last 8760 hours. HbA1C: No results for input(s): "HGBA1C" in the last 72 hours. CBG: No results for input(s): "GLUCAP" in the last 168 hours. Lipid Profile: No results for input(s): "CHOL", "HDL", "LDLCALC", "TRIG", "CHOLHDL", "LDLDIRECT" in the last 72 hours. Thyroid Function Tests: No results for input(s): "TSH", "T4TOTAL", "FREET4", "T3FREE", "THYROIDAB" in the last 72 hours. Anemia Panel: No results for input(s): "VITAMINB12", "FOLATE", "FERRITIN", "TIBC", "IRON", "RETICCTPCT" in the last 72 hours. Most Recent Urinalysis On File:     Component Value Date/Time   COLORURINE YELLOW (A) 05/04/2020 2154   APPEARANCEUR Clear 07/19/2021 1306   LABSPEC 1.012 05/04/2020 2154   PHURINE 5.0 05/04/2020 2154   GLUCOSEU Negative 07/19/2021 1306   HGBUR NEGATIVE 05/04/2020 2154   BILIRUBINUR Negative 07/19/2021 1306   KETONESUR NEGATIVE 05/04/2020 2154   PROTEINUR Negative 07/19/2021 1306   PROTEINUR NEGATIVE 05/04/2020 2154   NITRITE Negative 07/19/2021 1306   NITRITE NEGATIVE 05/04/2020 2154   LEUKOCYTESUR Negative 07/19/2021 1306   LEUKOCYTESUR SMALL (A) 05/04/2020 2154   Sepsis Labs: @LABRCNTIP (procalcitonin:4,lacticidven:4) Microbiology: No results found for this or any previous visit (from the past 240 hour(s)).    Radiology Studies last 3 days: DG Thoracic Spine 2 View  Result Date:  03/18/2023 CLINICAL DATA:  89 year old female with 3 column fracture through the ankylosed thoracic spine at T10 and T11. EXAM: THORACIC SPINE 2 VIEWS COMPARISON:  Thoracic spine CT and MRI 03/17/2023. FINDINGS: Upright appearing thoracic spine AP and lateral views in a brace. There is new/increased lower thoracic kyphotic deformity of about 18 degrees when compared to the thoracic spine CT. The increased angulation appears to be about the T11 body which is now partially collapsed with anterior wedging. Stable L1 vertebral body hemangioma. Underlying spinal ankylosis. Underlying osteopenia. Negative visible chest and abdominal visceral contours. IMPRESSION: Recent fracture through the ankylosed spine with partial anterior collapse of T11 and increased kyphotic deformity by 18 degrees when compared to the CT on 03/17/2023. Electronically Signed   By: Odessa Fleming M.D.   On: 03/18/2023 08:22   MR THORACIC SPINE WO CONTRAST  Result Date: 03/17/2023 CLINICAL DATA:  Acute fracture through the superior endplate of T11, involving the posterior elements of T10 and spinous process of T9 EXAM: MRI THORACIC SPINE WITHOUT CONTRAST TECHNIQUE: Multiplanar, multisequence MR imaging of the thoracic spine was performed. No intravenous contrast was administered. COMPARISON:  03/17/2023 CT thoracic spine FINDINGS: Alignment:  No listhesis. Vertebrae: Redemonstrated acute fracture through the vertebral body of T11 (series 12, image 9), without significant retropulsion. Increased T2 signal extends into the pedicles and facets bilaterally (series 21, images 6 and 13). Minimal vertebral body height loss. No significant T2 hyperintense signal is seen associated with the T10 posterior element fractures or with the T9 spinous process fracture. Additional T2 hyperintense signal is seen at the superior aspect of L1 (series 21, image 11), which is not appreciated on the prior CT, with a fracture plane extending from the anterior superior aspect  to the posterior mid to inferior cortex. No significant retropulsion. Minimal vertebral body height loss. No other acute fracture is seen. No suspicious osseous lesions or evidence of discitis osteomyelitis. Cord: Normal signal and morphology. No evidence of epidural collection. Paraspinal and other soft tissues: Small amount of prevertebral edema anterior to T10  and T11. Small hiatal hernia. Disc levels: No significant spinal canal stenosis or neural foraminal narrowing. IMPRESSION: 1. Redemonstrated acute fracture through the vertebral body of T11, without significant retropulsion. Edema extends into the pedicles and facets bilaterally. 2. Acute fracture of the superior aspect of L1, which is not appreciated on the prior CT, with minimal vertebral body height loss but no significant retropulsion. 3. No significant T2 hyperintense signal is seen associated with the T10 posterior element fractures or with the T9 spinous process fracture. 4. No significant spinal canal stenosis or neural foraminal narrowing in the thoracic spine. These results were called by telephone at the time of interpretation on 03/17/2023 at 7:40 pm to provider [facility] , who verbally acknowledged these results. Electronically Signed   By: Wiliam Ke M.D.   On: 03/17/2023 19:42   CT Thoracic Spine Wo Contrast  Result Date: 03/17/2023 CLINICAL DATA:  Back trauma, no prior imaging (Age >= 16y). EXAM: CT THORACIC AND LUMBAR SPINE WITHOUT CONTRAST TECHNIQUE: Multidetector CT imaging of the thoracic and lumbar spine was performed without contrast. Multiplanar CT image reconstructions were also generated. RADIATION DOSE REDUCTION: This exam was performed according to the departmental dose-optimization program which includes automated exposure control, adjustment of the mA and/or kV according to patient size and/or use of iterative reconstruction technique. COMPARISON:  None Available. FINDINGS: CT THORACIC SPINE FINDINGS Alignment: Normal.  Vertebrae: Acute fracture through the superior endplate of T11 extending to involve the posterior elements of T10 bilaterally in the spinous process of T9 (sagittal image 29 series 6). No evidence of epidural hematoma. Mild paraspinal fat stranding. Underlying diffuse idiopathic skeletal hyperostosis. Paraspinal and other soft tissues: Aortic atherosclerosis. Coronary artery calcifications. Small hiatal hernia. Disc levels: No spinal canal stenosis or neural foraminal narrowing in the thoracic spine. CT LUMBAR SPINE FINDINGS Segmentation: Standard. Alignment: Normal. Vertebrae: Normal vertebral body heights.  No acute fracture. Paraspinal and other soft tissues: Sigmoid diverticulosis. Disc levels: Disc bulge and facet arthropathy contribute to mild spinal canal stenosis at L4-5. IMPRESSION: 1. Acute fracture through the superior endplate of T11 extending to involve the posterior elements of T10 bilaterally and the spinous process of T9. No evidence of epidural hematoma. 2. No acute fracture or traumatic listhesis in the lumbar spine. Aortic Atherosclerosis (ICD10-I70.0). These results were called by telephone at the time of interpretation on 03/17/2023 at 2:59 pm to provider [facility] , who verbally acknowledged these results. Electronically Signed   By: Orvan Falconer M.D.   On: 03/17/2023 15:03   CT Lumbar Spine Wo Contrast  Result Date: 03/17/2023 CLINICAL DATA:  Back trauma, no prior imaging (Age >= 16y). EXAM: CT THORACIC AND LUMBAR SPINE WITHOUT CONTRAST TECHNIQUE: Multidetector CT imaging of the thoracic and lumbar spine was performed without contrast. Multiplanar CT image reconstructions were also generated. RADIATION DOSE REDUCTION: This exam was performed according to the departmental dose-optimization program which includes automated exposure control, adjustment of the mA and/or kV according to patient size and/or use of iterative reconstruction technique. COMPARISON:  None Available.  FINDINGS: CT THORACIC SPINE FINDINGS Alignment: Normal. Vertebrae: Acute fracture through the superior endplate of T11 extending to involve the posterior elements of T10 bilaterally in the spinous process of T9 (sagittal image 29 series 6). No evidence of epidural hematoma. Mild paraspinal fat stranding. Underlying diffuse idiopathic skeletal hyperostosis. Paraspinal and other soft tissues: Aortic atherosclerosis. Coronary artery calcifications. Small hiatal hernia. Disc levels: No spinal canal stenosis or neural foraminal narrowing in the thoracic spine. CT LUMBAR SPINE  FINDINGS Segmentation: Standard. Alignment: Normal. Vertebrae: Normal vertebral body heights.  No acute fracture. Paraspinal and other soft tissues: Sigmoid diverticulosis. Disc levels: Disc bulge and facet arthropathy contribute to mild spinal canal stenosis at L4-5. IMPRESSION: 1. Acute fracture through the superior endplate of T11 extending to involve the posterior elements of T10 bilaterally and the spinous process of T9. No evidence of epidural hematoma. 2. No acute fracture or traumatic listhesis in the lumbar spine. Aortic Atherosclerosis (ICD10-I70.0). These results were called by telephone at the time of interpretation on 03/17/2023 at 2:59 pm to provider [facility] , who verbally acknowledged these results. Electronically Signed   By: Orvan Falconer M.D.   On: 03/17/2023 15:03             LOS: 2 days       Sunnie Nielsen, DO [facility] 03/19/2023, 2:27 PM    Dictation software may have been used to generate the above note. Typos may occur and escape review in typed/dictated notes. Please contact Dr Lyn Hollingshead directly for clarity if needed.  Staff may message me via secure chat in Epic  but this may not receive an immediate response,  please page me for urgent matters!  If 7PM-7AM, please contact night coverage [email or web address]

## 2023-03-20 ENCOUNTER — Inpatient Hospital Stay: Payer: Medicare Other | Admitting: Certified Registered"

## 2023-03-20 ENCOUNTER — Encounter: Payer: Self-pay | Admitting: Osteopathic Medicine

## 2023-03-20 ENCOUNTER — Inpatient Hospital Stay: Payer: Medicare Other

## 2023-03-20 ENCOUNTER — Encounter
Admission: EM | Disposition: A | Discharge status: Skilled Nursing Facility | Payer: Self-pay | Source: Home / Self Care | Attending: Internal Medicine

## 2023-03-20 DIAGNOSIS — M4014 Other secondary kyphosis, thoracic region: Secondary | ICD-10-CM | POA: Diagnosis not present

## 2023-03-20 DIAGNOSIS — M532X4 Spinal instabilities, thoracic region: Secondary | ICD-10-CM | POA: Diagnosis not present

## 2023-03-20 DIAGNOSIS — S32019A Unspecified fracture of first lumbar vertebra, initial encounter for closed fracture: Secondary | ICD-10-CM | POA: Diagnosis not present

## 2023-03-20 DIAGNOSIS — S22008A Other fracture of unspecified thoracic vertebra, initial encounter for closed fracture: Secondary | ICD-10-CM | POA: Diagnosis not present

## 2023-03-20 DIAGNOSIS — S22089A Unspecified fracture of T11-T12 vertebra, initial encounter for closed fracture: Secondary | ICD-10-CM | POA: Diagnosis not present

## 2023-03-20 DIAGNOSIS — M40294 Other kyphosis, thoracic region: Secondary | ICD-10-CM | POA: Diagnosis not present

## 2023-03-20 HISTORY — PX: APPLICATION OF INTRAOPERATIVE CT SCAN: SHX6668

## 2023-03-20 LAB — CBC
HCT: 36 % (ref 36.0–46.0)
Hemoglobin: 11.9 g/dL — ABNORMAL LOW (ref 12.0–15.0)
MCH: 32.2 pg (ref 26.0–34.0)
MCHC: 33.1 g/dL (ref 30.0–36.0)
MCV: 97.3 fL (ref 80.0–100.0)
Platelets: 116 10*3/uL — ABNORMAL LOW (ref 150–400)
RBC: 3.7 MIL/uL — ABNORMAL LOW (ref 3.87–5.11)
RDW: 14.6 % (ref 11.5–15.5)
WBC: 4.9 10*3/uL (ref 4.0–10.5)
nRBC: 0 % (ref 0.0–0.2)

## 2023-03-20 LAB — HEPARIN LEVEL (UNFRACTIONATED)
Heparin Unfractionated: 0.79 IU/mL — ABNORMAL HIGH (ref 0.30–0.70)
Heparin Unfractionated: <0.1 IU/mL — ABNORMAL LOW (ref 0.30–0.70)

## 2023-03-20 LAB — APTT
aPTT: 189 seconds (ref 24–36)
aPTT: 152 seconds — ABNORMAL HIGH (ref 24–36)
aPTT: 34 seconds (ref 24–36)

## 2023-03-20 SURGERY — POSTERIOR THORACIC FUSION 4 LEVELS
Anesthesia: General

## 2023-03-20 MED ORDER — SODIUM CHLORIDE 0.9 % IV SOLN
INTRAVENOUS | Status: DC | PRN
  Administered 2023-03-20: 40 mL

## 2023-03-20 MED ORDER — DIPHENHYDRAMINE-ZINC ACETATE 2-0.1 % EX CREA
TOPICAL_CREAM | Freq: Three times a day (TID) | CUTANEOUS | Status: DC | PRN
  Filled 2023-03-20: qty 28

## 2023-03-20 MED ORDER — FENTANYL CITRATE PF 50 MCG/ML IJ SOSY
PREFILLED_SYRINGE | INTRAMUSCULAR | Status: AC
  Filled 2023-03-20: qty 1

## 2023-03-20 MED ORDER — SUCCINYLCHOLINE CHLORIDE 200 MG/10ML IV SOSY
PREFILLED_SYRINGE | INTRAVENOUS | Status: DC | PRN
  Administered 2023-03-20: 80 mg via INTRAVENOUS

## 2023-03-20 MED ORDER — ACETAMINOPHEN 10 MG/ML IV SOLN
INTRAVENOUS | Status: AC
  Filled 2023-03-20: qty 100

## 2023-03-20 MED ORDER — ESMOLOL HCL 100 MG/10ML IV SOLN
INTRAVENOUS | Status: DC | PRN
  Administered 2023-03-20 – 2023-03-21 (×2): 20 mg via INTRAVENOUS

## 2023-03-20 MED ORDER — ONDANSETRON HCL 4 MG/2ML IJ SOLN
INTRAMUSCULAR | Status: AC
  Filled 2023-03-20: qty 2

## 2023-03-20 MED ORDER — DROPERIDOL 2.5 MG/ML IJ SOLN: 0.6250 mg | Freq: Once | INTRAMUSCULAR | Status: DC | PRN

## 2023-03-20 MED ORDER — BUPIVACAINE-EPINEPHRINE (PF) 0.5% -1:200000 IJ SOLN
INTRAMUSCULAR | Status: AC
  Filled 2023-03-20: qty 20

## 2023-03-20 MED ORDER — PROPOFOL 10 MG/ML IV BOLUS
INTRAVENOUS | Status: DC | PRN
  Administered 2023-03-20: 30 mg via INTRAVENOUS
  Administered 2023-03-20: 100 mg via INTRAVENOUS

## 2023-03-20 MED ORDER — ACETAMINOPHEN 10 MG/ML IV SOLN
INTRAVENOUS | Status: DC | PRN
  Administered 2023-03-20: 1000 mg via INTRAVENOUS

## 2023-03-20 MED ORDER — BUPIVACAINE-EPINEPHRINE (PF) 0.5% -1:200000 IJ SOLN
INTRAMUSCULAR | Status: AC
  Filled 2023-03-20: qty 10

## 2023-03-20 MED ORDER — ONDANSETRON HCL 4 MG/2ML IJ SOLN
INTRAMUSCULAR | Status: DC | PRN
  Administered 2023-03-20: 4 mg via INTRAVENOUS

## 2023-03-20 MED ORDER — FENTANYL CITRATE PF 50 MCG/ML IJ SOSY
25.0000 ug | PREFILLED_SYRINGE | INTRAMUSCULAR | Status: DC | PRN
  Administered 2023-03-20 (×3): 25 ug via INTRAVENOUS

## 2023-03-20 MED ORDER — SURGIRINSE WOUND IRRIGATION SYSTEM - OPTIME
TOPICAL | Status: DC | PRN
  Administered 2023-03-20: 450 mL via TOPICAL

## 2023-03-20 MED ORDER — DEXAMETHASONE SODIUM PHOSPHATE 10 MG/ML IJ SOLN
INTRAMUSCULAR | Status: DC | PRN
  Administered 2023-03-20: 5 mg via INTRAVENOUS

## 2023-03-20 MED ORDER — HYDROMORPHONE HCL 1 MG/ML IJ SOLN
INTRAMUSCULAR | Status: DC | PRN
  Administered 2023-03-20: .25 mg via INTRAVENOUS

## 2023-03-20 MED ORDER — PHENYLEPHRINE HCL (PRESSORS) 10 MG/ML IV SOLN
INTRAVENOUS | Status: AC
  Filled 2023-03-20: qty 1

## 2023-03-20 MED ORDER — DEXAMETHASONE SODIUM PHOSPHATE 10 MG/ML IJ SOLN
INTRAMUSCULAR | Status: AC
  Filled 2023-03-20: qty 1

## 2023-03-20 MED ORDER — BUPIVACAINE HCL 0.5 % IJ SOLN
INTRAMUSCULAR | Status: DC | PRN
  Administered 2023-03-20: 20 mL

## 2023-03-20 MED ORDER — CEFAZOLIN SODIUM-DEXTROSE 1-4 GM/50ML-% IV SOLN
1.0000 g | Freq: Once | INTRAVENOUS | Status: AC
  Administered 2023-03-20: 1 g via INTRAVENOUS

## 2023-03-20 MED ORDER — SURGIFLO WITH THROMBIN (HEMOSTATIC MATRIX KIT) OPTIME
TOPICAL | Status: DC | PRN
  Administered 2023-03-20: 1 via TOPICAL

## 2023-03-20 MED ORDER — FENTANYL CITRATE (PF) 100 MCG/2ML IJ SOLN
INTRAMUSCULAR | Status: DC | PRN
  Administered 2023-03-20 (×2): 50 ug via INTRAVENOUS

## 2023-03-20 MED ORDER — PHENYLEPHRINE HCL-NACL 20-0.9 MG/250ML-% IV SOLN
INTRAVENOUS | Status: DC | PRN
  Administered 2023-03-20: 30 ug/min via INTRAVENOUS

## 2023-03-20 MED ORDER — EPHEDRINE SULFATE (PRESSORS) 50 MG/ML IJ SOLN
INTRAMUSCULAR | Status: DC | PRN
  Administered 2023-03-20: 5 mg via INTRAVENOUS

## 2023-03-20 MED ORDER — BUPIVACAINE-EPINEPHRINE (PF) 0.5% -1:200000 IJ SOLN
INTRAMUSCULAR | Status: DC | PRN
  Administered 2023-03-20: 12 mL via PERINEURAL

## 2023-03-20 MED ORDER — LIDOCAINE HCL (CARDIAC) PF 100 MG/5ML IV SOSY
PREFILLED_SYRINGE | INTRAVENOUS | Status: DC | PRN
  Administered 2023-03-20: 60 mg via INTRAVENOUS

## 2023-03-20 MED ORDER — SODIUM CHLORIDE (PF) 0.9 % IJ SOLN
INTRAMUSCULAR | Status: AC
  Filled 2023-03-20: qty 40

## 2023-03-20 MED ORDER — HYDROMORPHONE HCL 1 MG/ML IJ SOLN: 0.5000 mg | INTRAMUSCULAR | Status: DC | PRN

## 2023-03-20 MED ORDER — REMIFENTANIL HCL 1 MG IV SOLR
INTRAVENOUS | Status: DC | PRN
  Administered 2023-03-20: .08 ug/kg/min via INTRAVENOUS

## 2023-03-20 MED ORDER — HYDROMORPHONE HCL 1 MG/ML IJ SOLN
INTRAMUSCULAR | Status: AC
  Filled 2023-03-20: qty 1

## 2023-03-20 MED ORDER — OXYCODONE HCL 5 MG PO TABS
5.0000 mg | ORAL_TABLET | ORAL | Status: DC | PRN
  Administered 2023-03-21 – 2023-04-05 (×13): 5 mg via ORAL
  Filled 2023-03-20 (×15): qty 1

## 2023-03-20 MED ORDER — SODIUM CHLORIDE FLUSH 0.9 % IV SOLN
INTRAVENOUS | Status: AC
  Filled 2023-03-20: qty 20

## 2023-03-20 MED ORDER — 0.9 % SODIUM CHLORIDE (POUR BTL) OPTIME
TOPICAL | Status: DC | PRN
  Administered 2023-03-20: 460 mL

## 2023-03-20 MED ORDER — BUPIVACAINE HCL (PF) 0.5 % IJ SOLN
INTRAMUSCULAR | Status: AC
  Filled 2023-03-20: qty 30

## 2023-03-20 MED ORDER — FENTANYL CITRATE (PF) 100 MCG/2ML IJ SOLN
INTRAMUSCULAR | Status: AC
  Filled 2023-03-20: qty 2

## 2023-03-20 MED ORDER — SODIUM CHLORIDE 0.9% FLUSH
INTRAVENOUS | Status: DC | PRN
  Administered 2023-03-20: 20 mL via INTRAVENOUS

## 2023-03-20 MED ORDER — BUPIVACAINE LIPOSOME 1.3 % IJ SUSP
INTRAMUSCULAR | Status: AC
  Filled 2023-03-20: qty 20

## 2023-03-20 MED ORDER — FENTANYL CITRATE (PF) 100 MCG/2ML IJ SOLN
25.0000 ug | INTRAMUSCULAR | Status: DC | PRN
  Administered 2023-03-21 (×2): 50 ug via INTRAVENOUS

## 2023-03-20 MED ORDER — REMIFENTANIL HCL 1 MG IV SOLR
INTRAVENOUS | Status: AC
  Filled 2023-03-20: qty 1000

## 2023-03-20 SURGICAL SUPPLY — 69 items
ADH SKN CLS APL DERMABOND .7 (GAUZE/BANDAGES/DRESSINGS) ×2
AGENT HMST KT MTR STRL THRMB (HEMOSTASIS) ×2
ALLOGRAFT BONE FIBER KORE 5 (Bone Implant) IMPLANT
APL PRP STRL LF DISP 70% ISPRP (MISCELLANEOUS) ×2
BASIN GRAD PLASTIC 32OZ STRL (MISCELLANEOUS) IMPLANT
BASIN KIT SINGLE STR (MISCELLANEOUS) ×2 IMPLANT
BUR NEURO DRILL SOFT 3.0X3.8M (BURR) ×2 IMPLANT
CEMENT MAX VISCOSITY NUVA (Cement) IMPLANT
CHLORAPREP W/TINT 26 (MISCELLANEOUS) IMPLANT
CNTNR URN SCR LID CUP LEK RST (MISCELLANEOUS) ×2 IMPLANT
CONT SPEC 4OZ STRL OR WHT (MISCELLANEOUS) ×2
DERMABOND ADVANCED .7 DNX12 (GAUZE/BANDAGES/DRESSINGS) ×2 IMPLANT
DRAPE HD 5FT BACK TABLE (DRAPES) IMPLANT
DRAPE LAPAROTOMY 100X77 ABD (DRAPES) ×2 IMPLANT
DRAPE SCAN PATIENT (DRAPES) ×2 IMPLANT
DRESSING PREVENA PLUS CUSTOM (GAUZE/BANDAGES/DRESSINGS) IMPLANT
DRSG PREVENA PLUS CUSTOM (GAUZE/BANDAGES/DRESSINGS) ×2
DRSG TEGADERM 4X4.75 (GAUZE/BANDAGES/DRESSINGS) IMPLANT
ELECT CAUTERY BLADE TIP 2.5 (TIP) ×2
ELECT EZSTD 165MM 6.5IN (MISCELLANEOUS)
ELECT REM PT RETURN 9FT ADLT (ELECTROSURGICAL) ×2
ELECTRODE CAUTERY BLDE TIP 2.5 (TIP) ×4 IMPLANT
ELECTRODE EZSTD 165MM 6.5IN (MISCELLANEOUS) IMPLANT
ELECTRODE REM PT RTRN 9FT ADLT (ELECTROSURGICAL) ×2 IMPLANT
EVACUATOR 400CC W/10F 1/8 (MISCELLANEOUS) IMPLANT
EX-PIN ORTHOLOCK NAV 4X150 (PIN) IMPLANT
Fenny tip IMPLANT
GLOVE BIO SURGEON STRL SZ7.5 (GLOVE) IMPLANT
GLOVE BIOGEL PI IND STRL 8.5 (GLOVE) ×2 IMPLANT
GLOVE SURG SYN 8.5 E (GLOVE) ×12 IMPLANT
GLOVE SURG SYN 8.5 PF PI (GLOVE) ×6 IMPLANT
GOWN SRG XL LVL 3 NONREINFORCE (GOWNS) ×2 IMPLANT
GOWN STRL NON-REIN TWL XL LVL3 (GOWNS) ×2
GRAFT DURAGEN MATRIX 1WX1L (Tissue) IMPLANT
GUIDEWIRE NITINOL BEVEL TIP (WIRE) IMPLANT
KIT PREVENA INCISION MGT 13 (CANNISTER) ×2 IMPLANT
KIT SPINAL PRONEVIEW (KITS) ×2 IMPLANT
LABEL OR SOLS (LABEL) IMPLANT
MANIFOLD NEPTUNE II (INSTRUMENTS) ×2 IMPLANT
MARKER SKIN DUAL TIP RULER LAB (MISCELLANEOUS) ×2 IMPLANT
MARKER SPHERE PSV REFLC 13MM (MARKER) ×14 IMPLANT
NDL AND PUSHER RELINE MAS (NEEDLE) IMPLANT
NDL SAFETY ECLIP 18X1.5 (MISCELLANEOUS) ×2 IMPLANT
NDLE AND PUSHER RELINE MAS (NEEDLE) ×4
NS IRRIG 1000ML POUR BTL (IV SOLUTION) ×2 IMPLANT
PACK LAMINECTOMY ARMC (PACKS) ×2 IMPLANT
PAD ARMBOARD 7.5X6 YLW CONV (MISCELLANEOUS) ×2 IMPLANT
ROD STRAIGHT RELINE 5.5X500 (Rod) IMPLANT
SCREW RELINE PA 6.5X40 (Screw) IMPLANT
SCREW RELINE PA 6.5X45 (Screw) IMPLANT
SOLUTION IRRIG SURGIPHOR (IV SOLUTION) ×2 IMPLANT
SPIKE FLUID TRANSFER (MISCELLANEOUS) IMPLANT
SPONGE DRAIN TRACH 4X4 STRL 2S (GAUZE/BANDAGES/DRESSINGS) IMPLANT
STAPLER SKIN PROX 35W (STAPLE) IMPLANT
SURGIFLO W/THROMBIN 8M KIT (HEMOSTASIS) ×2 IMPLANT
SUT DVC VLOC 3-0 CL 6 P-12 (SUTURE) ×2 IMPLANT
SUT ETHILON 3-0 FS-10 30 BLK (SUTURE) ×2
SUT VIC AB 0 CT1 27 (SUTURE) ×6
SUT VIC AB 0 CT1 27XCR 8 STRN (SUTURE) ×4 IMPLANT
SUT VIC AB 2-0 CT1 18 (SUTURE) ×4 IMPLANT
SUT VLOC 90 3-0 CLR P-12 (SUTURE) IMPLANT
SUTURE EHLN 3-0 FS-10 30 BLK (SUTURE) IMPLANT
SYR 10ML LL (SYRINGE) ×2 IMPLANT
SYR 30ML LL (SYRINGE) ×4 IMPLANT
TIP FENNY SPINE (MISCELLANEOUS) IMPLANT
TIP FENS REPLACE RELINE (MISCELLANEOUS) IMPLANT
TOWEL OR 17X26 4PK STRL BLUE (TOWEL DISPOSABLE) ×4 IMPLANT
TRAP FLUID SMOKE EVACUATOR (MISCELLANEOUS) ×4 IMPLANT
WATER STERILE IRR 1000ML POUR (IV SOLUTION) ×4 IMPLANT

## 2023-03-20 NOTE — Progress Notes (Signed)
Order to stop heparin gtt by Dr. Myer Haff.

## 2023-03-20 NOTE — Progress Notes (Signed)
PROGRESS NOTE    Sheryl Michael   ZOX:[id number] DOB: [birth year]  DOA: 03/17/2023 Date of Service: 03/20/23 PCP: Marguarite Arbour, MD     Brief Narrative / Hospital Course:  [name] is a 89 year old female history of atrial fibrillation, hypertension, hyperlipidemia, hypothyroid. She presents emergency department 03/17/23 from [facility] assisted living for chief concerns of back pain of unknown etiology. Question trauma as she was found on the floor. She does not think that she fell, she thought that she just laid down on the floor because her back was hurting her.  She denies any neurological symptoms currently other than pain. 05/31: CT shows T9, T10 T11 fx. MRI shows only T11 and likely L1 fx are acute, and do not suggest obvious instability. Neurosurgery advised TLSO and T spine upright xrays once in brace to evaluate alignment, can plan on brace only when OOB if Xrays show maintained alignment, Dr Myer Haff will follow  06/01: repeat XR show increasing angulation, planning for surgery Monday. Afib w/ intermittent RVR, IV dilt push ineffective, started dilt gtt and converted to NSR. Paused gtt , increased po dose dilt, no further events until...  06/02: Again in rapid Afib early AM, restarted dilt gtt. Improved. Will leave on drip for now. Otherwise stable, pain controlled  06/03: Heparin stopped this morning, following levels may need reversal. Surgery pending for later today.    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 Neurosurgery following, plan surgical intervention  TSLO brace Symptomatic support: Morphine 4 mg IV every 4 hours as needed for moderate pain, 20 hours ordered; fentanyl 25 mcg IV every 4 hours as needed for severe pain, 15 hours ordered   Afib RVR onset 03/18/23  Paroxysmal Afib Xarelto transitioned to heparin gtt in anticipation of surgery, following levels for noon may need reversal, pharmacy aware  paused gtt but back into rapid Afib, so will leave dilt gtt running unless brady/hypotension Given would have to remove TLSO brace, w/ unstable spinal fracture risk deemed greater than benefit for echo    Malignant neoplasm of breast Anastrozole 1 mg daily resumed   HLD (hyperlipidemia) Atorvastatin 40 mg daily resumed   Hypothyroidism, acquired Levothyroxine 50 mcg daily resumed   Depression, Dementia Escitalopram 10 mg daily and memantine 5 mg po daily resumed           DVT prophylaxis: heparin gtt pending surgery  Pertinent IV fluids/nutrition: no continuous IV fluids  Central lines / invasive devices: none   Code Status: FULL CODE ACP documentation reviewed: 03/18/23 none on file VYNCA    Current Admission Status: inpatient  TOC needs / Dispo plan: pend neurosurgery recs w/ PT/OT eval following procedure, expect may need SNF placement  Barriers to discharge / significant pending items: pain control, neurosurgery recs w/ PT/OT eval, expect will be medically stable perhaps mid- to late-week  Subjective / Brief ROS:  Patient reports pain is more controlled this morning  Denies CP/SOB.  Denies new weakness.  Reports no concerns w/ urination/defecation.   Family Communication: spoke w/ son and his wife at bedside     Objective Findings:  Vitals:   03/20/23 0000 03/20/23 0314 03/20/23 0812 03/20/23 1144  BP: (!) 119/54 126/69 133/61 (!) 142/58  Pulse: 86 85 85 61  Resp:  18 16 18 18   Temp: 97.9 F (36.6 C) 98 F (36.7 C) 99.3 F (37.4 C) 98.1 F (36.7 C)  TempSrc: Oral Oral  Oral  SpO2: 94% 94% 94% 95%  Weight:      Height:        Intake/Output Summary (Last 24 hours) at 03/20/2023 1331 Last data filed at 03/19/2023 1843 Gross per 24 hour  Intake --  Output 350 ml  Net -350 ml   Filed Weights   03/17/23 1310  Weight: 53.5 kg    Examination:  Physical Exam Constitutional:      General: She is not in acute distress.    Appearance: She is not toxic-appearing.  Cardiovascular:     Rate and Rhythm: Normal rate. Rhythm irregular.  Pulmonary:     Effort: Pulmonary effort is normal.     Breath sounds: Normal breath sounds.     Comments: Exam limited d/t brace  Musculoskeletal:     Right lower leg: No edema.     Left lower leg: No edema.  Neurological:     Mental Status: She is alert. Mental status is at baseline.  Psychiatric:        Mood and Affect: Mood normal.        Behavior: Behavior normal.          Scheduled Medications:   acidophilus  1 capsule Oral BID   anastrozole  1 mg Oral Daily   atorvastatin  40 mg Oral Daily   cholecalciferol  1,000 Units Oral Daily   diltiazem  180 mg Oral Daily   docusate sodium  100 mg Oral BID   escitalopram  10 mg Oral Daily   levothyroxine  50 mcg Oral QAC breakfast   lidocaine  1 patch Transdermal Q24H   memantine  5 mg Oral BID   pantoprazole  40 mg Oral Daily    Continuous Infusions:  sodium chloride 75 mL/hr at 03/19/23 0314   diltiazem (CARDIZEM) infusion Stopped (03/19/23 0944)    PRN Medications:  sodium chloride, acetaminophen **OR** acetaminophen, diphenhydrAMINE-zinc acetate, HYDROmorphone (DILAUDID) injection, ondansetron **OR** ondansetron (ZOFRAN) IV, oxyCODONE, polyethylene glycol, simethicone  Antimicrobials from admission:  Anti-infectives (From admission, onward)    None           Data Reviewed:  I have personally reviewed the following...  CBC: Recent  Labs  Lab 03/17/23 1454 03/18/23 0533 03/19/23 0433 03/20/23 0301  WBC 8.7 5.8 6.6 4.9  NEUTROABS 7.4  --   --   --   HGB 13.8 13.0 14.3 11.9*  HCT 42.2 41.2 46.8* 36.0  MCV 97.5 99.8 103.3* 97.3  PLT 153 136* 119* 116*   Basic Metabolic Panel: Recent Labs  Lab 03/17/23 1454 03/18/23 0533  NA 141 142  K 4.0 4.2  CL 107 109  CO2 24 25  GLUCOSE 108* 84  BUN 19 22  CREATININE 0.91 0.99  CALCIUM 8.9 8.7*   GFR: Estimated Creatinine Clearance: 27.7 mL/min (by C-G formula based on SCr of 0.99 mg/dL). Liver Function Tests: Recent Labs  Lab 03/17/23 1454  AST 25  ALT 19  ALKPHOS 87  BILITOT 1.0  PROT 6.3*  ALBUMIN 3.1*   No results for input(s): "LIPASE", "AMYLASE" in the last 168 hours. No results for input(s): "AMMONIA" in the last 168 hours. Coagulation Profile: Recent Labs  Lab 03/18/23 1714  INR 1.1   Cardiac Enzymes: No results for input(s): "CKTOTAL", "CKMB", "CKMBINDEX", "TROPONINI" in the last 168 hours. BNP (last 3 results) No results for input(s): "PROBNP" in the last 8760 hours. HbA1C: No results for input(s): "HGBA1C" in the last 72 hours. CBG: No results for input(s): "GLUCAP" in the last 168 hours. Lipid Profile: No results for input(s): "CHOL", "HDL", "LDLCALC", "TRIG", "CHOLHDL", "LDLDIRECT" in the last 72 hours. Thyroid Function Tests: No results for input(s): "TSH", "T4TOTAL", "FREET4", "T3FREE", "THYROIDAB" in the last 72 hours. Anemia Panel: No results for input(s): "VITAMINB12", "FOLATE", "FERRITIN", "TIBC", "IRON", "RETICCTPCT" in the last 72 hours. Most Recent Urinalysis On File:     Component Value Date/Time   COLORURINE YELLOW (A) 05/04/2020 2154   APPEARANCEUR Clear 07/19/2021 1306   LABSPEC 1.012 05/04/2020 2154   PHURINE 5.0 05/04/2020 2154   GLUCOSEU Negative 07/19/2021 1306   HGBUR NEGATIVE 05/04/2020 2154   BILIRUBINUR Negative 07/19/2021 1306   KETONESUR NEGATIVE 05/04/2020 2154   PROTEINUR Negative 07/19/2021 1306    PROTEINUR NEGATIVE 05/04/2020 2154   NITRITE Negative 07/19/2021 1306   NITRITE NEGATIVE 05/04/2020 2154   LEUKOCYTESUR Negative 07/19/2021 1306   LEUKOCYTESUR SMALL (A) 05/04/2020 2154   Sepsis Labs: @LABRCNTIP (procalcitonin:4,lacticidven:4) Microbiology: No results found for this or any previous visit (from the past 240 hour(s)).    Radiology Studies last 3 days: DG Thoracic Spine 2 View  Result Date: 03/18/2023 CLINICAL DATA:  89 year old female with 3 column fracture through the ankylosed thoracic spine at T10 and T11. EXAM: THORACIC SPINE 2 VIEWS COMPARISON:  Thoracic spine CT and MRI 03/17/2023. FINDINGS: Upright appearing thoracic spine AP and lateral views in a brace. There is new/increased lower thoracic kyphotic deformity of about 18 degrees when compared to the thoracic spine CT. The increased angulation appears to be about the T11 body which is now partially collapsed with anterior wedging. Stable L1 vertebral body hemangioma. Underlying spinal ankylosis. Underlying osteopenia. Negative visible chest and abdominal visceral contours. IMPRESSION: Recent fracture through the ankylosed spine with partial anterior collapse of T11 and increased kyphotic deformity by 18 degrees when compared to the CT on 03/17/2023. Electronically Signed   By: Odessa Fleming M.D.   On: 03/18/2023 08:22   MR THORACIC SPINE WO CONTRAST  Result Date: 03/17/2023 CLINICAL DATA:  Acute fracture through the superior endplate of T11, involving the posterior elements of T10 and spinous process of T9 EXAM: MRI THORACIC SPINE WITHOUT CONTRAST TECHNIQUE: Multiplanar, multisequence MR imaging of the thoracic spine was performed. No intravenous contrast was administered. COMPARISON:  03/17/2023 CT thoracic spine FINDINGS: Alignment:  No listhesis. Vertebrae: Redemonstrated acute fracture through the vertebral body of T11 (series 12, image 9), without significant retropulsion. Increased T2 signal extends into the pedicles and  facets bilaterally (series 21, images 6 and 13). Minimal vertebral body height loss. No significant T2 hyperintense signal is seen associated with the T10 posterior element fractures or with the T9 spinous process fracture. Additional T2 hyperintense signal is seen at the superior aspect of L1 (series 21, image 11), which is not appreciated on the prior CT, with a fracture plane extending from the anterior superior aspect to the posterior mid to inferior cortex. No significant retropulsion. Minimal vertebral  body height loss. No other acute fracture is seen. No suspicious osseous lesions or evidence of discitis osteomyelitis. Cord: Normal signal and morphology. No evidence of epidural collection. Paraspinal and other soft tissues: Small amount of prevertebral edema anterior to T10 and T11. Small hiatal hernia. Disc levels: No significant spinal canal stenosis or neural foraminal narrowing. IMPRESSION: 1. Redemonstrated acute fracture through the vertebral body of T11, without significant retropulsion. Edema extends into the pedicles and facets bilaterally. 2. Acute fracture of the superior aspect of L1, which is not appreciated on the prior CT, with minimal vertebral body height loss but no significant retropulsion. 3. No significant T2 hyperintense signal is seen associated with the T10 posterior element fractures or with the T9 spinous process fracture. 4. No significant spinal canal stenosis or neural foraminal narrowing in the thoracic spine. These results were called by telephone at the time of interpretation on 03/17/2023 at 7:40 pm to provider [facility] , who verbally acknowledged these results. Electronically Signed   By: Wiliam Ke M.D.   On: 03/17/2023 19:42   CT Thoracic Spine Wo Contrast  Result Date: 03/17/2023 CLINICAL DATA:  Back trauma, no prior imaging (Age >= 16y). EXAM: CT THORACIC AND LUMBAR SPINE WITHOUT CONTRAST TECHNIQUE: Multidetector CT imaging of the thoracic and lumbar spine was  performed without contrast. Multiplanar CT image reconstructions were also generated. RADIATION DOSE REDUCTION: This exam was performed according to the departmental dose-optimization program which includes automated exposure control, adjustment of the mA and/or kV according to patient size and/or use of iterative reconstruction technique. COMPARISON:  None Available. FINDINGS: CT THORACIC SPINE FINDINGS Alignment: Normal. Vertebrae: Acute fracture through the superior endplate of T11 extending to involve the posterior elements of T10 bilaterally in the spinous process of T9 (sagittal image 29 series 6). No evidence of epidural hematoma. Mild paraspinal fat stranding. Underlying diffuse idiopathic skeletal hyperostosis. Paraspinal and other soft tissues: Aortic atherosclerosis. Coronary artery calcifications. Small hiatal hernia. Disc levels: No spinal canal stenosis or neural foraminal narrowing in the thoracic spine. CT LUMBAR SPINE FINDINGS Segmentation: Standard. Alignment: Normal. Vertebrae: Normal vertebral body heights.  No acute fracture. Paraspinal and other soft tissues: Sigmoid diverticulosis. Disc levels: Disc bulge and facet arthropathy contribute to mild spinal canal stenosis at L4-5. IMPRESSION: 1. Acute fracture through the superior endplate of T11 extending to involve the posterior elements of T10 bilaterally and the spinous process of T9. No evidence of epidural hematoma. 2. No acute fracture or traumatic listhesis in the lumbar spine. Aortic Atherosclerosis (ICD10-I70.0). These results were called by telephone at the time of interpretation on 03/17/2023 at 2:59 pm to provider [facility] , who verbally acknowledged these results. Electronically Signed   By: Orvan Falconer M.D.   On: 03/17/2023 15:03   CT Lumbar Spine Wo Contrast  Result Date: 03/17/2023 CLINICAL DATA:  Back trauma, no prior imaging (Age >= 16y). EXAM: CT THORACIC AND LUMBAR SPINE WITHOUT CONTRAST TECHNIQUE:  Multidetector CT imaging of the thoracic and lumbar spine was performed without contrast. Multiplanar CT image reconstructions were also generated. RADIATION DOSE REDUCTION: This exam was performed according to the departmental dose-optimization program which includes automated exposure control, adjustment of the mA and/or kV according to patient size and/or use of iterative reconstruction technique. COMPARISON:  None Available. FINDINGS: CT THORACIC SPINE FINDINGS Alignment: Normal. Vertebrae: Acute fracture through the superior endplate of T11 extending to involve the posterior elements of T10 bilaterally in the spinous process of T9 (sagittal image 29 series 6). No evidence of  epidural hematoma. Mild paraspinal fat stranding. Underlying diffuse idiopathic skeletal hyperostosis. Paraspinal and other soft tissues: Aortic atherosclerosis. Coronary artery calcifications. Small hiatal hernia. Disc levels: No spinal canal stenosis or neural foraminal narrowing in the thoracic spine. CT LUMBAR SPINE FINDINGS Segmentation: Standard. Alignment: Normal. Vertebrae: Normal vertebral body heights.  No acute fracture. Paraspinal and other soft tissues: Sigmoid diverticulosis. Disc levels: Disc bulge and facet arthropathy contribute to mild spinal canal stenosis at L4-5. IMPRESSION: 1. Acute fracture through the superior endplate of T11 extending to involve the posterior elements of T10 bilaterally and the spinous process of T9. No evidence of epidural hematoma. 2. No acute fracture or traumatic listhesis in the lumbar spine. Aortic Atherosclerosis (ICD10-I70.0). These results were called by telephone at the time of interpretation on 03/17/2023 at 2:59 pm to provider [facility] , who verbally acknowledged these results. Electronically Signed   By: Orvan Falconer M.D.   On: 03/17/2023 15:03             LOS: 3 days       Sunnie Nielsen, DO [facility] 03/20/2023, 1:31 PM    Dictation software  may have been used to generate the above note. Typos may occur and escape review in typed/dictated notes. Please contact Dr Lyn Hollingshead directly for clarity if needed.  Staff may message me via secure chat in Epic  but this may not receive an immediate response,  please page me for urgent matters!  If 7PM-7AM, please contact night coverage [email or web address]

## 2023-03-20 NOTE — Progress Notes (Signed)
ANTICOAGULATION CONSULT NOTE  Pharmacy Consult for Heparin Drip Indication: atrial fibrillation  Allergies  Allergen Reactions   Bactrim [Sulfamethoxazole-Trimethoprim] Hives   Iodinated Contrast Media Anaphylaxis   Codeine Nausea And Vomiting   Erythromycin Ethylsuccinate Other (See Comments)    Unknown    Phenobarbital Other (See Comments)    Feeling funny, nervous   Ciprofloxacin Rash   Penicillins Rash    Patient Measurements: Height: 5' (152.4 cm) Weight: 53.5 kg (118 lb) IBW/kg (Calculated) : 45.5 Heparin Dosing Weight: 53.5 kg  Vital Signs: Temp: 98 F (36.7 C) (06/03 0314) Temp Source: Oral (06/03 0314) BP: 126/69 (06/03 0314) Pulse Rate: 85 (06/03 0314)  Labs: Recent Labs    03/17/23 1454 03/18/23 0533 03/18/23 1714 03/18/23 1714 03/19/23 0108 03/19/23 0433 03/19/23 0915 03/19/23 1757 03/20/23 0301  HGB 13.8 13.0  --   --   --  14.3  --   --  11.9*  HCT 42.2 41.2  --   --   --  46.8*  --   --  36.0  PLT 153 136*  --   --   --  119*  --   --  116*  APTT  --   --  33   < > 88*  --  65* 37*  --   LABPROT  --   --  14.7  --   --   --   --   --   --   INR  --   --  1.1  --   --   --   --   --   --   HEPARINUNFRC  --   --  0.32  --   --   --   --  0.18* 0.79*  CREATININE 0.91 0.99  --   --   --   --   --   --   --   TROPONINIHS 7 9  --   --   --   --   --   --   --    < > = values in this interval not displayed.     Estimated Creatinine Clearance: 27.7 mL/min (by C-G formula based on SCr of 0.99 mg/dL).   Medical History: Past Medical History:  Diagnosis Date   Arthritis    Atrial fibrillation (HCC)    Breast cancer (HCC) 2006   right breast ca with lumpectomy and rad tx, left breast ca 2021lumpectomy and rad tx   Colon polyp    Complication of anesthesia    questions about husband who passed in 2012    Cystocele    Depression    Dysrhythmia    Family history of breast cancer    Female bladder prolapse    GERD (gastroesophageal reflux  disease)    Goiter    Hyperlipemia    Hypertension    Hypothyroidism    Osteoporosis    Personal history of radiation therapy 2006   right breast ca and left breast 2021   Pneumonia    Procidentia of uterus    Recurrent UTI    Reflux    TIA (transient ischemic attack)    Vaginal atrophy    Assessment: Patient is a 89yo female presenting with back pain. CT and MRI show fractures, neurosurgery plans for surgery on Monday. Patient has a history of afib and was taking Xarelto prior to admission, pharmacy consulted to transition patient to Heparin infusion for planned surgery on Monday. Last dose of Xarelto was on  5/30.   Baseline HL is 0.32  Goal of Therapy:  Heparin level 0.3-0.7 units/ml aPTT 66-102 seconds Monitor platelets by anticoagulation protocol: Yes   06/02 0108 aPTT 88, therapeutic x 1 06/02 0915 aPTT 65,  barely subtherapeutic 06/02 1757 aPTT 37, HL 0.18 Subtherapeutic 06/03 0301 HL 0.79, Supratherapeutic, aPTT supratherapeutic   Plan:  --Decrease heparin infusion to 950 units/hr --Recheck HL in 8 hrs after rate change --Daily CBC while on Heparin drip  Otelia Sergeant, PharmD, [facility] 03/20/2023 3:55 AM

## 2023-03-20 NOTE — Anesthesia Preprocedure Evaluation (Signed)
Anesthesia Evaluation  Patient identified by MRN, date of birth, ID band Patient awake    Reviewed: Allergy & Precautions, NPO status , Patient's Chart, lab work & pertinent test results  History of Anesthesia Complications Negative for: history of anesthetic complications  Airway Mallampati: II       Dental  (+) Dental Advidsory Given, Poor Dentition   Pulmonary neg pulmonary ROS, neg shortness of breath, neg sleep apnea, neg COPD, neg recent URI, Not current smoker          Cardiovascular hypertension, Pt. on medications (-) angina (-) Past MI and (-) CHF + dysrhythmias Atrial Fibrillation (-) Valvular Problems/Murmurs     Neuro/Psych neg Seizures PSYCHIATRIC DISORDERS  Depression    TIA (Arm weakness, resolved quickly)   GI/Hepatic Neg liver ROS,GERD  Medicated and Controlled,,  Endo/Other  neg diabetesHypothyroidism    Renal/GU Renal InsufficiencyRenal disease     Musculoskeletal   Abdominal   Peds  Hematology   Anesthesia Other Findings Past Medical History: No date: Arthritis No date: Atrial fibrillation (HCC) 2006: Breast cancer (HCC)     Comment:  right breast ca with lumpectomy and rad tx, left breast               ca 2021lumpectomy and rad tx No date: Colon polyp No date: Complication of anesthesia     Comment:  questions about husband who passed in 2012  No date: Cystocele No date: Depression No date: Dysrhythmia No date: Family history of breast cancer No date: Female bladder prolapse No date: GERD (gastroesophageal reflux disease) No date: Goiter No date: Hyperlipemia No date: Hypertension No date: Hypothyroidism No date: Osteoporosis 2006: Personal history of radiation therapy     Comment:  right breast ca and left breast 2021 No date: Pneumonia 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 No date: Recurrent UTI No date: Reflux No date: TIA (transient ischemic attack) No date: Vaginal atrophy    Reproductive/Obstetrics                             Anesthesia Physical Anesthesia Plan  ASA: III  Anesthesia Plan: General   Post-op Pain Management:    Induction: Intravenous  PONV Risk Score and Plan: 3 and Dexamethasone, Ondansetron and Treatment may vary due to age or medical condition  Airway Management Planned: Oral ETT  Additional Equipment:   Intra-op Plan:   Post-operative Plan:   Informed Consent: I have reviewed the patients History and Physical, chart, labs and discussed the procedure including the risks, benefits and alternatives for the proposed anesthesia with the patient or authorized representative who has indicated his/her understanding and acceptance.       Plan Discussed with:   Anesthesia Plan Comments:         Anesthesia Quick Evaluation

## 2023-03-20 NOTE — Anesthesia Procedure Notes (Signed)
Procedure Name: Intubation Date/Time: 03/20/2023 8:05 AM  Performed by: Karoline Caldwell, CRNAPre-anesthesia Checklist: Patient identified, Patient being monitored, Timeout performed, Emergency Drugs available and Suction available Patient Re-evaluated:Patient Re-evaluated prior to induction Oxygen Delivery Method: Circle system utilized Preoxygenation: Pre-oxygenation with 100% oxygen Induction Type: IV induction Ventilation: Mask ventilation without difficulty Laryngoscope Size: Mac and 3 Grade View: Grade I Tube type: Oral Tube size: 6.5 mm Number of attempts: 1 Airway Equipment and Method: Stylet Placement Confirmation: ETT inserted through vocal cords under direct vision, positive ETCO2 and breath sounds checked- equal and bilateral Secured at: 20 cm Tube secured with: Tape Dental Injury: Teeth and Oropharynx as per pre-operative assessment

## 2023-03-20 NOTE — Care Management Important Message (Signed)
Important Message  Patient Details  Name: Sheryl Michael MRN: [id number] Date of Birth: December 11, 1930   Medicare Important Message Given:  N/A - LOS <3 / Initial given by admissions     [name] 03/20/2023, 8:46 AM

## 2023-03-20 NOTE — Interval H&P Note (Signed)
History and Physical Interval Note:  03/20/2023 7:10 PM  Sheryl Michael  has presented today for surgery, with the diagnosis of T11 fracture with instability, thoracic instability, 3 column fracture.  The various methods of treatment have been discussed with the patient and family. After consideration of risks, benefits and other options for treatment, the patient has consented to  Procedure(s) with comments: POSTERIOR THORACIC FUSION 4 LEVELS (N/A) - T8-L2 PSF for complex T11 fracture as a surgical intervention.  The patient's history has been reviewed, patient examined, no change in status, stable for surgery.  I have reviewed the patient's chart and labs.  Questions were answered to the patient's satisfaction.    Heart sounds irregular but no MRG. Chest Clear to Auscultation Bilaterally.    Ahmed Inniss

## 2023-03-20 NOTE — Op Note (Signed)
Indications: [name] is suffering from  T11 fracture with instability, thoracic instability, 3 column fracture.    Due to worsening kyphosis when she had xrays performed, surgical intervention was recommended. Findings: ORIF T11 fracture   Preoperative Diagnosis:  T11 fracture with instability, thoracic instability, 3 column fracture  Postoperative Diagnosis: same   EBL: 100 ml IVF: see AR ml Drains:1 placed Disposition: Extubated and Stable to PACU Complications: none  No foley catheter was placed.   Preoperative Note:   Risks of surgery discussed include: infection, bleeding, stroke, coma, death, paralysis, CSF leak, nerve/spinal cord injury, numbness, tingling, weakness, complex regional pain syndrome, recurrent stenosis and/or disc herniation, vascular injury, development of instability, neck/back pain, need for further surgery, persistent symptoms, development of deformity, and the risks of anesthesia. The patient understood these risks and agreed to proceed.  Operative Note:  1. Open Reduction and internal fixation of T11 fracture and L1 fracture 2. Posterolateral arthrodesis T8 to L2 3. Posterior segmental instrumentation T8 to L2 using Nuvasive Reline implants with cement augmentation at T8=9 and L1-2 4. Use of stereotaxis    The patient was brought to the Operating Room, intubated and turned into the prone position. All pressure points were checked and double checked. Flouroscopy was used to mark the incision. The patient was prepped and draped in the standard fashion. A full timeout was performed. Preoperative antibiotics were given. The incision was injected with local anesthetic.  The incision was opened with a scalpel, then the soft tissues divided with the Bovie. Self-retaining retractors were placed. The paraspinus muscles were. Flouroscopy was used to confirm our localization.  The stereotactic array was placed.  Stereotactic images were acquired and  registered to the patient.  We then used stereotactically guided drill guides to cannulate the pedicles bilaterally from T8 to L2 with exception of T11.   The pedicle screws were placed.  6.5x40 mm Nuvasive Reline screws were used at T8-10, 6.5 x 45 mm screws were used from T12-L2.   At this point, the cement augmentation system was introduced.  Before cement augmenting the screws, the C-arm was brought back into the field to confirm that all implants were well within the bone.  The T10 screws were not in optimal position.  Thus, we decided to leave those without cement augmentation.  The T8 and T9 and L1 and L2 screws were cement augmented bilaterally.  The image guidance system was used to reposition the T10 screws.  These were then checked with the image guidance system.  Rods were measured to length, cut, and shaped. The rods were secured using locking caps to manufacturer's specifications.  By reducing the screws to the rod, the T11 and L1 fractures were openly reduced and internally fixated.    Final AP and lateral radiogrAPhs were taken to confirm placement of instrumentation and appropriate alignment. The wound was copiously irrigated, then the external surfaces of the remaining lamina, facet, and transverse processes from t8 to L2 were decorticated. Allograft was placed over the decorticated surfaces for arthrodesis.  A drain was placed subfascially.   After hemostasis, the wound was closed in layers with 0 and 2-0 vicryl. 3-0 monocryl and a wound vac were applied to the incision.  The patient was then flipped supine and extubated with incident. All counts were correct times 2 at the end of the case. No immediate complications were noted.  I performed the critical portions of the procedure.   Sheryl Night MD

## 2023-03-21 ENCOUNTER — Other Ambulatory Visit: Payer: Self-pay

## 2023-03-21 DIAGNOSIS — S22008A Other fracture of unspecified thoracic vertebra, initial encounter for closed fracture: Secondary | ICD-10-CM | POA: Diagnosis not present

## 2023-03-21 DIAGNOSIS — M532X4 Spinal instabilities, thoracic region: Secondary | ICD-10-CM | POA: Diagnosis not present

## 2023-03-21 DIAGNOSIS — S22089A Unspecified fracture of T11-T12 vertebra, initial encounter for closed fracture: Secondary | ICD-10-CM | POA: Diagnosis not present

## 2023-03-21 DIAGNOSIS — M40294 Other kyphosis, thoracic region: Secondary | ICD-10-CM | POA: Diagnosis not present

## 2023-03-21 LAB — CBC
HCT: 37.4 % (ref 36.0–46.0)
Hemoglobin: 12.1 g/dL (ref 12.0–15.0)
MCH: 31.3 pg (ref 26.0–34.0)
MCHC: 32.4 g/dL (ref 30.0–36.0)
MCV: 96.9 fL (ref 80.0–100.0)
Platelets: 114 10*3/uL — ABNORMAL LOW (ref 150–400)
RBC: 3.86 MIL/uL — ABNORMAL LOW (ref 3.87–5.11)
RDW: 14.3 % (ref 11.5–15.5)
WBC: 6 10*3/uL (ref 4.0–10.5)
nRBC: 0 % (ref 0.0–0.2)

## 2023-03-21 LAB — BASIC METABOLIC PANEL
Anion gap: 13 (ref 5–15)
BUN: 16 mg/dL (ref 8–23)
CO2: 18 mmol/L — ABNORMAL LOW (ref 22–32)
Calcium: 8.1 mg/dL — ABNORMAL LOW (ref 8.9–10.3)
Chloride: 108 mmol/L (ref 98–111)
Creatinine, Ser: 0.9 mg/dL (ref 0.44–1.00)
GFR, Estimated: >60 mL/min (ref >60)
Glucose, Bld: 164 mg/dL — ABNORMAL HIGH (ref 70–99)
Potassium: 3.7 mmol/L (ref 3.5–5.1)
Sodium: 139 mmol/L (ref 135–145)

## 2023-03-21 MED ORDER — FENTANYL CITRATE (PF) 100 MCG/2ML IJ SOLN
INTRAMUSCULAR | Status: AC
  Filled 2023-03-21: qty 2

## 2023-03-21 MED ORDER — HYDROMORPHONE HCL 1 MG/ML IJ SOLN: 1.0000 mg | Freq: Once | INTRAMUSCULAR | Status: DC

## 2023-03-21 MED ORDER — DILTIAZEM HCL-DEXTROSE 125-5 MG/125ML-% IV SOLN (PREMIX)
5.0000 mg/h | INTRAVENOUS | Status: DC
  Administered 2023-03-21 – 2023-03-22 (×2): 5 mg/h via INTRAVENOUS
  Filled 2023-03-21: qty 125

## 2023-03-21 MED ORDER — ENOXAPARIN SODIUM 40 MG/0.4ML IJ SOSY
40.0000 mg | PREFILLED_SYRINGE | INTRAMUSCULAR | Status: DC
  Administered 2023-03-21 – 2023-04-02 (×13): 40 mg via SUBCUTANEOUS
  Filled 2023-03-21 (×13): qty 0.4

## 2023-03-21 MED ORDER — OXYCODONE HCL 5 MG PO TABS
5.0000 mg | ORAL_TABLET | Freq: Once | ORAL | Status: AC
  Administered 2023-03-21: 5 mg via ORAL

## 2023-03-21 MED ORDER — OXYCODONE HCL 5 MG PO TABS
ORAL_TABLET | ORAL | Status: AC
  Filled 2023-03-21: qty 1

## 2023-03-21 NOTE — Evaluation (Signed)
Occupational Therapy Evaluation Patient Details Name: Sheryl Michael MRN: [id number] DOB: [birth year] Today's Date: 03/21/2023   History of Present Illness 89yo female s/p T8-L2 PSF with T8-9 and L1-2 cement augmentation for T11 and L1 fractures on 03/20/23. PMHx includes falls, paroxysmal afib with RVR, CKD, GERD, TIA,  recurrent UTI, breast cancer, hypothyroidism, HLD, depression.   Clinical Impression   Pt seen for OT evaluation this date, POD#1 from surgery. Prior to hospital admission and recent fall, pt was independent with ADL, med mgt, driving, and ambulating with RW. Son notes she just moved to an independent living apartment in May, 2024. Pt endorsed limited pain at rest in bed, which increased to ~7/10 with bed mobility. RN notified. Pt required MIN A for rolling with VC for sequencing, MOD-MAX A for side-lying<>sit EOB. Pt tolerated sitting EOB for <13min, primarily due to back pain. Once returned to supine, RN in to boost up in bed and provide pain medication. RN also educated in log roll technique for bed mobility. Pt/son educated in role of acute OT, AE for LB ADL, TLSO for OOB, and log roll for bed mobility. Pt/son verbalized understanding. Will provide a back precautions handout next session. Pt currently requires MAX A for LB ADL tasks, MINA  for UB ADL tasks, and anticipate heavy assist for [facility] transfer once pain more controlled. Pt will benefit from continued skilled OT services to maximize return to PLOF and minimize risk of falls and increased caregiver burden.     Recommendations for follow up therapy are one component of a multi-disciplinary discharge planning process, led by the attending physician.  Recommendations may be updated based on patient status, additional functional criteria and insurance authorization.   Assistance Recommended at Discharge Frequent or constant Supervision/Assistance  Patient can return home with the following Two people to help with walking and/or  transfers;A lot of help with bathing/dressing/bathroom;Assistance with cooking/housework;Help with stairs or ramp for entrance;Assist for transportation;Direct supervision/assist for medications management;Direct supervision/assist for financial management    Functional Status Assessment  Patient has had a recent decline in their functional status and demonstrates the ability to make significant improvements in function in a reasonable and predictable amount of time.  Equipment Recommendations  Other (comment) (defer to next venue, anticipate need for [facility])    Recommendations for Other Services       Precautions / Restrictions Precautions Precautions: Back;Fall Precaution Booklet Issued: No Required Braces or Orthoses: Spinal Brace Spinal Brace: Thoracolumbosacral orthotic;Applied in sitting position Spinal Brace Comments: for OOB Restrictions Weight Bearing Restrictions: No      Mobility Bed Mobility Overal bed mobility: Needs Assistance Bed Mobility: Rolling, Sidelying to Sit, Sit to Sidelying Rolling: Min assist Sidelying to sit: Mod assist, HOB elevated     Sit to sidelying: Max assist General bed mobility comments: VC for sequencing, pain limited for return to bed requiring MAX A    Transfers                   General transfer comment: too pain limited to attempt safely      Balance Overall balance assessment: Needs assistance Sitting-balance support: Single extremity supported, Bilateral upper extremity supported, Feet supported Sitting balance-Leahy Scale: Fair                                     ADL either performed or assessed with clinical judgement   ADL Overall ADL's :  Needs assistance/impaired                                       General ADL Comments: Pt currently requires MAX A for LB ADL tasks, MINA  for UB ADL tasks, and anticipate heavy assist for [facility] transfer once pain more controlled.     Vision          Perception     Praxis      Pertinent Vitals/Pain Pain Assessment Pain Assessment: Faces Faces Pain Scale: Hurts whole lot Pain Location: back Pain Descriptors / Indicators: Grimacing, Guarding Pain Intervention(s): Limited activity within patient's tolerance, Monitored during session, Repositioned, Patient requesting pain meds-RN notified, RN gave pain meds during session     Hand Dominance     Extremity/Trunk Assessment Upper Extremity Assessment Upper Extremity Assessment: Generalized weakness   Lower Extremity Assessment Lower Extremity Assessment: Generalized weakness   Cervical / Trunk Assessment Cervical / Trunk Assessment: Back Surgery   Communication Communication Communication: No difficulties   Cognition Arousal/Alertness: Awake/alert Behavior During Therapy: WFL for tasks assessed/performed Overall Cognitive Status: Impaired/Different from baseline Area of Impairment: Orientation                 Orientation Level: Disoriented to, Place, Time             General Comments: A bit sleepy, oriented to self and situation, follows commands with VC, pleasant, some minor difficulty with PLOF/home set up (just recently moved, son helps clarify)     General Comments       Exercises Other Exercises Other Exercises: Pt/son educated in role of acute OT, AE for LB ADL, TLSO for OOB, and log roll for bed mobility   Shoulder Instructions      Home Living Family/patient expects to be discharged to:: Other (Comment)                                 Additional Comments: Pt/son report [place] apartment [place]1st fl, no steps, walk in shower) by herself in May, 2024.      Prior Functioning/Environment Prior Level of Function : Driving;History of Falls (last six months);Independent/Modified Independent             Mobility Comments: Pt using RW for mobility ADLs Comments: Pt reports indep with ADL, med mgt, facility  provides meals and pt was driving but will not be driving anymore        OT Problem List: Decreased strength;Pain;Decreased activity tolerance;Impaired balance (sitting and/or standing);Decreased knowledge of use of DME or AE;Decreased knowledge of precautions;Decreased cognition      OT Treatment/Interventions: Self-care/ADL training;Therapeutic exercise;Therapeutic activities;Cognitive remediation/compensation;DME and/or AE instruction;Patient/family education;Balance training    OT Goals(Current goals can be found in the care plan section) Acute Rehab OT Goals Patient Stated Goal: go back home and have less pain OT Goal Formulation: With patient/family Time For Goal Achievement: 04/04/23 Potential to Achieve Goals: Good ADL Goals Pt Will Perform Upper Body Dressing: sitting;with supervision;with set-up Pt Will Perform Lower Body Dressing: sit to/from stand;with mod assist;with adaptive equipment Pt Will Transfer to Toilet: ambulating;bedside commode;with min assist (LRAD) Pt Will Perform Toileting - Clothing Manipulation and hygiene: sitting/lateral leans;with min guard assist Additional ADL Goal #1: Pt will independently instruct family/caregivers in TLSO mgt Additional ADL Goal #2: Pt will  complete log roll for bed mobility requiring MIN A and VC for sequencing.  OT Frequency: Min 3X/week    Co-evaluation              AM-PAC OT "6 Clicks" Daily Activity     Outcome Measure Help from another person eating meals?: None Help from another person taking care of personal grooming?: A Little Help from another person toileting, which includes using toliet, bedpan, or urinal?: A Lot Help from another person bathing (including washing, rinsing, drying)?: A Lot Help from another person to put on and taking off regular upper body clothing?: A Lot Help from another person to put on and taking off regular lower body clothing?: A Lot 6 Click Score: 15   End of Session Equipment  Utilized During Treatment: Oxygen Nurse Communication: Patient requests pain meds;Mobility status  Activity Tolerance: Patient limited by pain Patient left: in bed;with call bell/phone within reach;with bed alarm set;with nursing/sitter in room;with family/visitor present;with SCD's reapplied;Other (comment) (woudn vac and hemovac in place)  OT Visit Diagnosis: Other abnormalities of gait and mobility (R26.89);Muscle weakness (generalized) (M62.81);History of falling (Z91.81);Pain Pain - part of body:  (back)                Time: 4098-1191 OT Time Calculation (min): 33 min Charges:  OT General Charges $OT Visit: 1 Visit OT Evaluation $OT Eval Moderate Complexity: 1 Mod OT Treatments $Self Care/Home Management : 23-37 mins  Arman Filter., MPH, MS, OTR/L ascom [phone] 03/21/23, 4:42 PM

## 2023-03-21 NOTE — Progress Notes (Signed)
   Neurosurgery Progress Note  History: Sheryl Michael is s/p T8-L2 PSF with T8-9 and L1-2 cement augmentation for T11 and L1 fractures.   POD1: Reports good pain control this morning  Physical Exam: Vitals:   03/21/23 0615 03/21/23 0727  BP: (!) 124/45 (!) 131/47  Pulse:    Resp: 13 14  Temp:  97.8 F (36.6 C)  SpO2:  98%    Resting but arouses to voice. Oriented to person and place. Disoriented to time CNI  Strength:5/5 throughout BLE Wound vac in place   Data:  Other tests/results: NA  Assessment/Plan:  Sheryl Michael is a 89 y.o presenting with unstable T11 fracture s/p PSF.   - mobilize - pain control - continue incisional vac - DVT prophylaxis - PTOT - remainder of care per primary team.   Manning Charity PA-C Department of Neurosurgery

## 2023-03-21 NOTE — Progress Notes (Signed)
PROGRESS NOTE    Sheryl Michael   ZOX:[id number] DOB: Jun 03, 1931  DOA: 03/17/2023 Date of Service: 03/21/23 PCP: Marguarite Arbour, MD     Brief Narrative / Hospital Course:  Sheryl Michael is a 89 year old female history of atrial fibrillation, hypertension, hyperlipidemia, hypothyroid. She presents emergency department 03/17/23 from [facility] assisted living for chief concerns of back pain of unknown etiology. Question trauma as she was found on the floor. She does not think that she fell, she thought that she just laid down on the floor because her back was hurting her.  She denies any neurological symptoms currently other than pain. 05/31: MRI shows T11 and likely L1 fx are acute, and do not suggest obvious instability. Neurosurgery advised TLSO and T-spine upright xrays once in brace to evaluate alignment 06/01: repeat XR show increasing angulation, planning for surgery for spinal fusion Monday following Xarelto washout. Afib w/ intermittent RVR, IV dilt push ineffective, started dilt gtt and converted to NSR. Paused gtt , increased po dose dilt, no further events until...  06/02: Again in rapid Afib early AM, restarted dilt gtt + heparin gtt anticipating surgery.  06/03: T8-L2 posterior spinal fusion w/ T8-9 and L1-2 cement augmentation, for T11 and L1 fractures.  06/04: NSR off dilt gtt. Stable post-op, PT/OT to see.   Consultants:  Neurosurgery    Procedures: 03/20/2023 T8-L2 posterior spinal fusion w/ T8-9 and L1-2 cement augmentation, for T11 and L1 fractures.            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 Neurosurgery following - she is POD1 today 03/21/23 s/p T8-L2 posterior spinal fusion w/ T8-9 and L1-2 cement augmentation, for T11 and L1 fractures.  Mobilize as able w/ PT/OT Pain control Incisional vac to continue    Afib RVR onset 03/18/23 - resolved  Paroxysmal Afib Xarelto at home - resume DVT ppx here so restarted Lovenox, would avoid therapeutic anticoagulation for at least 14 days post-op per Dr Myer Haff unless life-threatening need for it    Malignant neoplasm of breast Anastrozole 1 mg daily resumed   HLD (hyperlipidemia) Atorvastatin 40 mg daily resumed   Hypothyroidism, acquired Levothyroxine 50 mcg daily resumed   Depression, Dementia Escitalopram 10 mg daily and memantine 5 mg po daily resumed           DVT prophylaxis: lovenox (plan restart Xarelto on 06/17) Pertinent IV fluids/nutrition: no continuous IV fluids  Central lines / invasive devices: none   Code Status: FULL CODE ACP documentation reviewed: 03/18/23 none on file [facility]CA    Current Admission Status: inpatient  TOC needs / Dispo plan: pend neurosurgery recs w/ PT/OT eval following procedure, expect may need SNF placement  Barriers to discharge / significant pending items: pain control, neurosurgery recs w/ PT/OT eval, expect will be medically stable perhaps mid- to late-week  Subjective / Brief ROS:  Patient resting this morning but easily awakened, reports pain is ok today  Denies CP/SOB.  Denies new weakness.  Reports no concerns w/ urination/defecation.   Family Communication: spoke w/ son at bedside     Objective Findings:  Vitals:   03/21/23 0727 03/21/23 0800 03/21/23 0900 03/21/23 1137  BP: (!) 131/47 (!) 132/45  135/60  Pulse: 65   69  Resp: 14 13 13 14   Temp: 97.8 F (36.6 C)   (!) 97.5 F (36.4 C)  TempSrc:      SpO2: 98%   100%  Weight:       Height:        Intake/Output Summary (Last 24 hours) at 03/21/2023 1507 Last data filed at 03/21/2023 1038 Gross per 24 hour  Intake 1338.13 ml  Output 200 ml  Net 1138.13 ml   Filed Weights   03/17/23 1310 03/20/23 1453  Weight: 53.5 kg 53.2 kg    Examination:  Physical Exam Constitutional:      General: She is not in acute distress.    Appearance: She is not toxic-appearing.  Cardiovascular:     Rate and Rhythm: Normal rate and regular rhythm.  Pulmonary:     Effort: Pulmonary effort is normal.     Breath sounds: Normal breath sounds.     Comments: Exam limited d/t brace  Musculoskeletal:     Right lower leg: No edema.     Left lower leg: No edema.  Neurological:     Mental Status: She is alert. Mental status is at baseline.  Psychiatric:        Mood and Affect: Mood normal.        Behavior: Behavior normal.          Scheduled Medications:   acidophilus  1 capsule Oral BID   anastrozole  1 mg Oral Daily   atorvastatin  40 mg Oral Daily   cholecalciferol  1,000 Units Oral Daily   diltiazem  180 mg Oral Daily   docusate sodium  100 mg Oral BID   enoxaparin (LOVENOX) injection  40 mg Subcutaneous Q24H   escitalopram  10 mg Oral Daily   levothyroxine  50 mcg Oral QAC breakfast   lidocaine  1 patch Transdermal Q24H   memantine  5 mg Oral BID   pantoprazole  40 mg Oral Daily    Continuous Infusions:  sodium chloride 75 mL/hr at 03/20/23 1952    PRN Medications:  sodium chloride, acetaminophen **OR** acetaminophen, diphenhydrAMINE-zinc acetate, fentaNYL (SUBLIMAZE) injection, HYDROmorphone (DILAUDID) injection, ondansetron **OR** ondansetron (ZOFRAN) IV, oxyCODONE, polyethylene glycol, simethicone  Antimicrobials from admission:  Anti-infectives (From admission, onward)    Start     Dose/Rate Route Frequency Ordered Stop   03/20/23 1915  ceFAZolin (ANCEF) IVPB 1 g/50 mL premix        1 g 100 mL/hr over 30 Minutes Intravenous  Once 03/20/23 1912 03/20/23  2030           Data Reviewed:  I have personally reviewed the following...  Telemetry : NSR   CBC: Recent Labs  Lab 03/17/23 1454 03/18/23 0533 03/19/23 0433 03/20/23 0301 03/21/23 0446  WBC 8.7 5.8 6.6 4.9 6.0  NEUTROABS 7.4  --   --   --   --   HGB 13.8 13.0 14.3 11.9* 12.1  HCT 42.2 41.2 46.8* 36.0 37.4  MCV 97.5 99.8 103.3* 97.3 96.9  PLT 153 136* 119* 116* 114*   Basic Metabolic Panel: Recent Labs  Lab 03/17/23 1454 03/18/23 0533 03/21/23 0446  NA 141 142 139  K 4.0 4.2 3.7  CL 107 109 108  CO2 24 25 18*  GLUCOSE 108* 84 164*  BUN 19 22 16   CREATININE 0.91 0.99 0.90  CALCIUM 8.9 8.7* 8.1*   GFR: Estimated Creatinine Clearance: 30.4 mL/min (by C-G formula based on SCr of 0.9 mg/dL). Liver Function Tests: Recent Labs  Lab 03/17/23 1454  AST 25  ALT 19  ALKPHOS 87  BILITOT 1.0  PROT 6.3*  ALBUMIN 3.1*   No results for input(s): "LIPASE", "AMYLASE" in the last 168 hours. No results for input(s): "AMMONIA" in the last 168 hours. Coagulation Profile: Recent Labs  Lab 03/18/23 1714  INR 1.1   Cardiac Enzymes: No results for input(s): "CKTOTAL", "CKMB", "CKMBINDEX", "TROPONINI" in the last 168 hours. BNP (last 3 results) No results for input(s): "PROBNP" in the last 8760 hours. HbA1C: No results for input(s): "HGBA1C" in the last 72 hours. CBG: No results for input(s): "GLUCAP" in the last 168 hours. Lipid Profile: No results for input(s): "CHOL", "HDL", "LDLCALC", "TRIG", "CHOLHDL", "LDLDIRECT" in the last 72 hours. Thyroid Function Tests: No results for input(s): "TSH", "T4TOTAL", "FREET4", "T3FREE", "THYROIDAB" in the last 72 hours. Anemia Panel: No results for input(s): "VITAMINB12", "FOLATE", "FERRITIN", "TIBC", "IRON", "RETICCTPCT" in the last 72 hours. Most Recent Urinalysis On File:     Component Value Date/Time   COLORURINE YELLOW (A) 05/04/2020 2154   APPEARANCEUR Clear 07/19/2021 1306   LABSPEC 1.012 05/04/2020 2154    PHURINE 5.0 05/04/2020 2154   GLUCOSEU Negative 07/19/2021 1306   HGBUR NEGATIVE 05/04/2020 2154   BILIRUBINUR Negative 07/19/2021 1306   KETONESUR NEGATIVE 05/04/2020 2154   PROTEINUR Negative 07/19/2021 1306   PROTEINUR NEGATIVE 05/04/2020 2154   NITRITE Negative 07/19/2021 1306   NITRITE NEGATIVE 05/04/2020 2154   LEUKOCYTESUR Negative 07/19/2021 1306   LEUKOCYTESUR SMALL (A) 05/04/2020 2154   Sepsis Labs: @LABRCNTIP (procalcitonin:4,lacticidven:4) Microbiology: No results found for this or any previous visit (from the past 240 hour(s)).    Radiology Studies last 3 days: DG Thoracic Spine 2 View  Result Date: 03/21/2023 CLINICAL DATA:  Thoracic fusion EXAM: THORACIC SPINE 2 VIEWS COMPARISON:  03/17/2023 MRI FLUOROSCOPY TIME:  Radiation Exposure Index (as provided by the fluoroscopic device): 55.26 mGy If the device does not provide the exposure index: Fluoroscopy Time:  2.3 seconds Number of Acquired Images:  12 FINDINGS: Pedicle screw placement is noted at T8 and extends through L2 with the exception of the T11 vertebral body. Vertebral cement was placed at T8, T9, L1 and L2. IMPRESSION: Fusion from T8-L2. Electronically Signed   By: Alcide Clever M.D.   On: 03/21/2023 01:19   DG C-Arm 1-60 Min-No Report  Result Date: 03/20/2023 Fluoroscopy was utilized by the requesting physician.  No radiographic interpretation.   DG C-Arm 1-60 Min-No Report  Result Date: 03/20/2023 Fluoroscopy was utilized by the requesting physician.  No radiographic interpretation.   DG C-Arm 1-60 Min-No Report  Result Date: 03/20/2023 Fluoroscopy was utilized by the requesting physician.  No radiographic interpretation.   DG Thoracic Spine 2 View  Result Date: 03/18/2023 CLINICAL DATA:  89 year old female with 3 column fracture through the ankylosed thoracic spine at T10 and T11. EXAM: THORACIC SPINE 2 VIEWS COMPARISON:  Thoracic spine CT and MRI 03/17/2023. FINDINGS: Upright appearing thoracic spine AP and  lateral views in a brace. There is new/increased lower thoracic kyphotic deformity of about 18 degrees when compared to the thoracic spine CT.  The increased angulation appears to be about the T11 body which is now partially collapsed with anterior wedging. Stable L1 vertebral body hemangioma. Underlying spinal ankylosis. Underlying osteopenia. Negative visible chest and abdominal visceral contours. IMPRESSION: Recent fracture through the ankylosed spine with partial anterior collapse of T11 and increased kyphotic deformity by 18 degrees when compared to the CT on 03/17/2023. Electronically Signed   By: Odessa Fleming M.D.   On: 03/18/2023 08:22   MR THORACIC SPINE WO CONTRAST  Result Date: 03/17/2023 CLINICAL DATA:  Acute fracture through the superior endplate of T11, involving the posterior elements of T10 and spinous process of T9 EXAM: MRI THORACIC SPINE WITHOUT CONTRAST TECHNIQUE: Multiplanar, multisequence MR imaging of the thoracic spine was performed. No intravenous contrast was administered. COMPARISON:  03/17/2023 CT thoracic spine FINDINGS: Alignment:  No listhesis. Vertebrae: Redemonstrated acute fracture through the vertebral body of T11 (series 12, image 9), without significant retropulsion. Increased T2 signal extends into the pedicles and facets bilaterally (series 21, images 6 and 13). Minimal vertebral body height loss. No significant T2 hyperintense signal is seen associated with the T10 posterior element fractures or with the T9 spinous process fracture. Additional T2 hyperintense signal is seen at the superior aspect of L1 (series 21, image 11), which is not appreciated on the prior CT, with a fracture plane extending from the anterior superior aspect to the posterior mid to inferior cortex. No significant retropulsion. Minimal vertebral body height loss. No other acute fracture is seen. No suspicious osseous lesions or evidence of discitis osteomyelitis. Cord: Normal signal and morphology. No  evidence of epidural collection. Paraspinal and other soft tissues: Small amount of prevertebral edema anterior to T10 and T11. Small hiatal hernia. Disc levels: No significant spinal canal stenosis or neural foraminal narrowing. IMPRESSION: 1. Redemonstrated acute fracture through the vertebral body of T11, without significant retropulsion. Edema extends into the pedicles and facets bilaterally. 2. Acute fracture of the superior aspect of L1, which is not appreciated on the prior CT, with minimal vertebral body height loss but no significant retropulsion. 3. No significant T2 hyperintense signal is seen associated with the T10 posterior element fractures or with the T9 spinous process fracture. 4. No significant spinal canal stenosis or neural foraminal narrowing in the thoracic spine. These results were called by telephone at the time of interpretation on 03/17/2023 at 7:40 pm to provider [facility] , who verbally acknowledged these results. Electronically Signed   By: Wiliam Ke M.D.   On: 03/17/2023 19:42             LOS: 4 days       Sunnie Nielsen, DO [facility] 03/21/2023, 3:07 PM    Dictation software may have been used to generate the above note. Typos may occur and escape review in typed/dictated notes. Please contact Dr Lyn Hollingshead directly for clarity if needed.  Staff may message me via secure chat in Epic  but this may not receive an immediate response,  please page me for urgent matters!  If 7PM-7AM, please contact night coverage [email or web address]

## 2023-03-21 NOTE — Transfer of Care (Signed)
Immediate Anesthesia Transfer of Care Note  Patient: Sheryl Michael  Procedure(s) Performed: POSTERIOR THORACIC FUSION 4 LEVELS APPLICATION OF INTRAOPERATIVE CT SCAN  Patient Location: PACU  Anesthesia Type:General  Level of Consciousness: awake, alert , and oriented  Airway & Oxygen Therapy: Patient Spontanous Breathing  Post-op Assessment: Report given to RN and Post -op Vital signs reviewed and stable  Post vital signs: Reviewed and stable  Last Vitals:  Vitals Value Taken Time  BP 127/50 03/21/23 0002  Temp 36.6 C 03/21/23 0002  Pulse 74 03/21/23 0002  Resp 14 03/21/23 0001  SpO2 98 % 03/21/23 0001  Vitals shown include unvalidated device data.  Last Pain:  Vitals:   03/20/23 1717  TempSrc:   PainSc: 8       Patients Stated Pain Goal: 0 (03/20/23 1213)  Complications: No notable events documented.

## 2023-03-22 DIAGNOSIS — S22089D Unspecified fracture of T11-T12 vertebra, subsequent encounter for fracture with routine healing: Secondary | ICD-10-CM

## 2023-03-22 MED ORDER — SODIUM CHLORIDE 0.9 % IV SOLN: INTRAVENOUS | Status: AC

## 2023-03-22 MED ORDER — METHOCARBAMOL 500 MG PO TABS
500.0000 mg | ORAL_TABLET | Freq: Three times a day (TID) | ORAL | Status: DC | PRN
  Administered 2023-03-23 – 2023-04-04 (×8): 500 mg via ORAL
  Filled 2023-03-22 (×8): qty 1

## 2023-03-22 MED ORDER — SODIUM CHLORIDE 0.9 % IV BOLUS
500.0000 mL | Freq: Once | INTRAVENOUS | Status: AC
  Administered 2023-03-22: 500 mL via INTRAVENOUS

## 2023-03-22 MED ORDER — OXYCODONE HCL 5 MG PO TABS
10.0000 mg | ORAL_TABLET | ORAL | Status: DC | PRN
  Administered 2023-03-22 – 2023-04-04 (×12): 10 mg via ORAL
  Filled 2023-03-22 (×12): qty 2

## 2023-03-22 MED ORDER — ACETAMINOPHEN 650 MG RE SUPP
650.0000 mg | Freq: Four times a day (QID) | RECTAL | Status: DC
  Filled 2023-03-22: qty 1

## 2023-03-22 MED ORDER — ACETAMINOPHEN 325 MG PO TABS
650.0000 mg | ORAL_TABLET | Freq: Four times a day (QID) | ORAL | Status: DC
  Administered 2023-03-22 – 2023-04-03 (×42): 650 mg via ORAL
  Administered 2023-04-03: 325 mg via ORAL
  Administered 2023-04-04 – 2023-04-05 (×6): 650 mg via ORAL
  Filled 2023-03-22 (×47): qty 2

## 2023-03-22 NOTE — Progress Notes (Signed)
Progress Note   Patient: Sheryl Michael:[id number] DOB: 01-03-1931 DOA: 03/17/2023     5 DOS: the patient was seen and examined on 03/22/2023    Subjective:  Patient seen and examined at bedside this morning In the presence of patient's granddaughter Admits to back pain however improved with pain medication Pain is worse when she moves however while laying in bed there is no pain   Brief Narrative / Hospital Course:  [name] is a 89 year old female history of atrial fibrillation, hypertension, hyperlipidemia, hypothyroid. She presents emergency department 03/17/23 from [facility] assisted living for chief concerns of back pain of unknown etiology. Question trauma as she was found on the floor. She does not think that she fell, she thought that she just laid down on the floor because her back was hurting her.  She denies any neurological symptoms currently other than pain. 05/31: MRI shows T11 and likely L1 fx are acute, and do not suggest obvious instability. Neurosurgery advised TLSO and T-spine upright xrays once in brace to evaluate alignment 06/01: repeat XR show increasing angulation, planning for surgery for spinal fusion Monday following Xarelto washout. Afib w/ intermittent RVR, IV dilt push ineffective, started dilt gtt and converted to NSR. Paused gtt , increased po dose dilt, no further events until...  06/02: Again in rapid Afib early AM, restarted dilt gtt + heparin gtt anticipating surgery.  06/03: T8-L2 posterior spinal fusion w/ T8-9 and L1-2 cement augmentation, for T11 and L1 fractures.  06/04: NSR off dilt gtt. Stable post-op, PT/OT to see.       ASSESSMENT & PLAN:   Principal Problem:   Closed T11 fracture (HCC) Active Problems:   Malignant neoplasm of breast (HCC)   GERD (gastroesophageal reflux disease)   HLD (hyperlipidemia)   OP (osteoporosis)   Hypothyroidism, acquired   Carcinoma of lower-outer quadrant of left breast in female, estrogen receptor  positive (HCC)   Inadequate pain control   Depression     Closed T11, L1 fractures [facility] MRI shows only T11 and likely L1 fx are acute, and do not suggest instability. Patient underwent  T8-L2 posterior spinal fusion w/ T8-9 and L1-2 cement augmentation, for T11 and L1 fractures.  By neurosurgeon on 03/21/2023 Continue to work with PT OT Continue current pain management   Afib RVR onset 03/18/23 - resolved  Paroxysmal Afib Xarelto at home - resumed DVT ppx here so restarted Lovenox, would avoid therapeutic anticoagulation for at least 14 days post-op per Dr Myer Haff unless life-threatening need for it    Malignant neoplasm of breast continue anastrozole 1 mg daily resumed   HLD (hyperlipidemia) Continue atorvastatin 40 mg daily resumed   Hypothyroidism, acquired Continue levothyroxine 50 mcg daily resumed   Depression, Dementia Escitalopram 10 mg daily and memantine 5 mg po daily resumed        DVT prophylaxis: lovenox (plan restart Xarelto on 06/17) Pertinent IV fluids/nutrition: no continuous IV fluids  Central lines / invasive devices: none   Code Status: FULL CODE   TOC needs / Dispo plan:  expect may need SNF placement   Barriers to discharge / significant pending items: Significant back pain needing IV pain medication     Subjective / Brief ROS:  Patient resting this morning but easily awakened, reports pain is ok today  Denies CP/SOB.  Denies new weakness.  Reports no concerns w/ urination/defecation.    Family Communication: spoke w/ son at bedside  Physical Exam Constitutional:      General: She is not in acute distress.    Appearance: She is not toxic-appearing.  Cardiovascular:     Rate and Rhythm: Normal rate and regular rhythm.  Pulmonary:     Effort: Pulmonary effort is normal.     Breath sounds: Normal breath sounds.     Comments: Exam limited d/t brace  Musculoskeletal:     Right lower leg: No edema.     Left lower leg:  No edema.  Neurological:     Mental Status: She is alert. Mental status is at baseline.  Psychiatric:        Mood and Affect: Mood normal.        Behavior: Behavior normal.      Vitals:   03/22/23 1438 03/22/23 1506 03/22/23 1511 03/22/23 1600  BP: (!) 95/47 96/60 96/60  (!) 94/51  Pulse:   88   Resp: 12 11 14 10   Temp:   97.6 F (36.4 C)   TempSrc:   Oral   SpO2:   94%   Weight:      Height:        Data Reviewed: I have reviewed patient's laboratory results showing sodium 139 potassium 3.7 I have also ordered follow-up CBC as well as BMP  Author: Loyce Dys, MD 03/22/2023 5:31 PM  For on call review [email or web address].

## 2023-03-22 NOTE — Progress Notes (Signed)
   03/22/23 0930  Assess: MEWS Score  BP (!) 92/52  MAP (mmHg) (!) 61  ECG Heart Rate (!) 105  Resp 20  Assess: MEWS Score  MEWS Temp 0  MEWS Systolic 1  MEWS Pulse 1  MEWS RR 0  MEWS LOC 0  MEWS Score 2  MEWS Score Color Yellow  Assess: if the MEWS score is Yellow or Red  Were vital signs taken at a resting state? Yes  Focused Assessment No change from prior assessment  Does the patient meet 2 or more of the SIRS criteria? No  Does the patient have a confirmed or suspected source of infection? No  MEWS guidelines implemented  Yes, yellow  Treat  MEWS Interventions Considered administering scheduled or prn medications/treatments as ordered  Take Vital Signs  Increase Vital Sign Frequency  Yellow: Q2hr x1, continue Q4hrs until patient remains green for 12hrs  Escalate  MEWS: Escalate Yellow: Discuss with charge nurse and consider notifying provider and/or RRT  Notify: Charge Nurse/RN  Name of Charge Nurse/RN Notified Mia, RN  Assess: SIRS CRITERIA  SIRS Temperature  0  SIRS Pulse 1  SIRS Respirations  0  SIRS WBC 0  SIRS Score Sum  1

## 2023-03-22 NOTE — Progress Notes (Addendum)
Spoke with Dr. Meriam Sprague on phone and made MD aware of patient's low blood pressure and that RN turned Cardizem drip off since SBP < 90. Discussed current rate 90-108 in Afib. Also discussed that patient has minimal urine output and is not drinking much fluid unless encouraged. Patient alert but sleepy after taking oral oxy for pain this morning. MD gave order to discontinue Cardizem drip and placing order for IVF bolus.

## 2023-03-22 NOTE — Progress Notes (Signed)
Alycia Rossetti, PA reinforced wound vac dressing in attempt to seal leak. Sheryl Michael continuing to alarm leak. PA stated that [facility] seems to still have a good seal.

## 2023-03-22 NOTE — Progress Notes (Signed)
Spoke with Dr. Meriam Sprague and made MD aware of BP 85/50 while asleep and BP 95/47 when awake after IVF bolus complete. MD stated he would order maintenance IVF. No further orders given.

## 2023-03-22 NOTE — Progress Notes (Signed)
   Neurosurgery Progress Note  History: Sheryl Michael is s/p T8-L2 PSF with T8-9 and L1-2 cement augmentation for T11 and L1 fractures.   POD2: Worsening back pain this morning POD1: Reports good pain control this morning  Physical Exam: Vitals:   03/22/23 0730 03/22/23 0800  BP: (!) 104/47 (!) 114/50  Pulse:    Resp: 18 20  Temp:    SpO2:      Resting but arouses to voice.  Strength: MAEW Wound vac in place  HV output not recorded  Data:  Other tests/results: NA  Assessment/Plan:  Sheryl Michael is a 89 y.o presenting with unstable T11 fracture s/p PSF.   - mobilize - pain control - continue incisional vac; will interrogate later this morning as it has been leaking overnight - DVT prophylaxis - PTOT - remainder of care per primary team.   Manning Charity PA-C Department of Neurosurgery

## 2023-03-22 NOTE — Progress Notes (Signed)
Paged Dr. Meriam Sprague regarding patient's low blood pressure. Have not heard back from MD after secure chat was sent earlier. Patient alert but BP is low. Cardizem now turned off due to SBP < 90.

## 2023-03-22 NOTE — Progress Notes (Signed)
Spoke with Alycia Rossetti, [place] and made her aware that patient's wound vac has been alarming a leak. PA stated she would come assess after she finishes a case. This RN assessed dressing and no obvious source of leak found.

## 2023-03-22 NOTE — Progress Notes (Signed)
Patient has had 1 incontinent void today and 100 cc UOP from external catheter. Bladder scanned at 1815 and 178 cc bladder scan volume result.

## 2023-03-22 NOTE — Progress Notes (Signed)
PT Cancellation Note  Patient Details Name: Sheryl Michael MRN: [id number] DOB: August 01, 1931   Cancelled Treatment:    Reason Eval/Treat Not Completed: Medical issues which prohibited therapy (Author deferred PT eval this AM due to very high pain levels, analgesia in process; 2nd attempt to see this PM, pressures trending very low, MAP<65, pain remains high. Will hold off at this time, attempt eval next date as able.)  12:34 PM, 03/22/23 [name], PT, DPT Physical Therapist - [facility]  [phone] (ASCOM)    [name] C 03/22/2023, 12:34 PM

## 2023-03-23 DIAGNOSIS — S22081D Stable burst fracture of T11-T12 vertebra, subsequent encounter for fracture with routine healing: Secondary | ICD-10-CM | POA: Diagnosis not present

## 2023-03-23 LAB — BASIC METABOLIC PANEL
Anion gap: 6 (ref 5–15)
BUN: 18 mg/dL (ref 8–23)
CO2: 24 mmol/L (ref 22–32)
Calcium: 7.8 mg/dL — ABNORMAL LOW (ref 8.9–10.3)
Chloride: 107 mmol/L (ref 98–111)
Creatinine, Ser: 0.71 mg/dL (ref 0.44–1.00)
GFR, Estimated: >60 mL/min (ref >60)
Glucose, Bld: 100 mg/dL — ABNORMAL HIGH (ref 70–99)
Potassium: 3.8 mmol/L (ref 3.5–5.1)
Sodium: 137 mmol/L (ref 135–145)

## 2023-03-23 LAB — CBC WITH DIFFERENTIAL/PLATELET
Abs Immature Granulocytes: 0.03 10*3/uL (ref 0.00–0.07)
Basophils Absolute: 0 10*3/uL (ref 0.0–0.1)
Basophils Relative: 0 %
Eosinophils Absolute: 0.3 10*3/uL (ref 0.0–0.5)
Eosinophils Relative: 4 %
HCT: 32.7 % — ABNORMAL LOW (ref 36.0–46.0)
Hemoglobin: 10.7 g/dL — ABNORMAL LOW (ref 12.0–15.0)
Immature Granulocytes: 1 %
Lymphocytes Relative: 9 %
Lymphs Abs: 0.6 10*3/uL — ABNORMAL LOW (ref 0.7–4.0)
MCH: 31.8 pg (ref 26.0–34.0)
MCHC: 32.7 g/dL (ref 30.0–36.0)
MCV: 97.3 fL (ref 80.0–100.0)
Monocytes Absolute: 0.4 10*3/uL (ref 0.1–1.0)
Monocytes Relative: 6 %
Neutro Abs: 5.2 10*3/uL (ref 1.7–7.7)
Neutrophils Relative %: 80 %
Platelets: 128 10*3/uL — ABNORMAL LOW (ref 150–400)
RBC: 3.36 MIL/uL — ABNORMAL LOW (ref 3.87–5.11)
RDW: 14.7 % (ref 11.5–15.5)
WBC: 6.6 10*3/uL (ref 4.0–10.5)
nRBC: 0 % (ref 0.0–0.2)

## 2023-03-23 MED ORDER — METOPROLOL TARTRATE 25 MG PO TABS
12.5000 mg | ORAL_TABLET | Freq: Two times a day (BID) | ORAL | Status: DC
  Administered 2023-03-23 – 2023-03-29 (×11): 12.5 mg via ORAL
  Filled 2023-03-23 (×13): qty 1

## 2023-03-23 NOTE — Progress Notes (Signed)
Occupational Therapy Treatment Patient Details Name: Sheryl Michael MRN: [id number] DOB: [birth year] Today's Date: 03/23/2023   History of present illness TAMELLA ADDAMS is an 89yoF who comes to [facility] 5/31, found on floor by staff at [facility]. PMH: AF, HTN, HLD, hypoTSH. Thoracic MRI revealing of acute T9, T10 fractures, unstable T11 body fracture, acute T1 fracture. Pt taken to OR with neurosurgical Dr. Myer Haff, is now s/p T8-L2 PSF with T8-9 and L1-2 cement augmentation for T11 and L1 fractures.   OT comments  Pt seen for OT tx this date. Pt initially very sleepy, believes OT is her granddaughter. Reoriented, lights turned on, and pt placed in modified chair position in bed. Alertness and orientation improving with this. Pt endorsing back pain, no sleep, and dtr notes he hasn't been eating much. Dtr also notes that pt had difficulty earlier with bringing utensils to her mouth requiring assistance and reporting double vision. Neuro screen completed. No visual deficits appreciated. Decreased hand eye coordination bilaterally and no unilateral sensory of strength deficits appreciated. Pt able to follow simple commands once more alert. Pt encouraged to sit up with lights on through dinner. Pt/dtr agreeable. RN notified of daughter's concerns. Pt/dtr educated in delirium prevention. Pt continues to benefit from skilled OT services.    Recommendations for follow up therapy are one component of a multi-disciplinary discharge planning process, led by the attending physician.  Recommendations may be updated based on patient status, additional functional criteria and insurance authorization.    Assistance Recommended at Discharge Frequent or constant Supervision/Assistance  Patient can return home with the following  Two people to help with walking and/or transfers;A lot of help with bathing/dressing/bathroom;Assistance with cooking/housework;Help with stairs or ramp for entrance;Assist for  transportation;Direct supervision/assist for medications management;Direct supervision/assist for financial management   Equipment Recommendations  Other (comment);BSC/3in1 (defer)    Recommendations for Other Services      Precautions / Restrictions Precautions Precautions: Back;Fall Required Braces or Orthoses: Spinal Brace Spinal Brace: Thoracolumbosacral orthotic;Applied in sitting position Spinal Brace Comments: for OOB Restrictions Weight Bearing Restrictions: No       Mobility Bed Mobility                    Transfers                         Balance                                           ADL either performed or assessed with clinical judgement   ADL Overall ADL's : Needs assistance/impaired     Grooming: Wash/dry face;Oral care;Wash/dry hands;Set up;Supervision/safety Grooming Details (indicate cue type and reason): long sitting in bed, increased time/effort                                    Extremity/Trunk Assessment Upper Extremity Assessment Upper Extremity Assessment: Generalized weakness;RUE deficits/detail;LUE deficits/detail RUE Deficits / Details: decr hand eye coordination RUE Sensation: WNL LUE Deficits / Details: decr hand eye coordination LUE Sensation: WNL            Vision       Perception     Praxis      Cognition Arousal/Alertness: Lethargic   Overall Cognitive Status: Impaired/Different from  baseline                                 General Comments: sleepy, improves with upright positioning and lights on        Exercises      Shoulder Instructions       General Comments      Pertinent Vitals/ Pain       Pain Assessment Pain Assessment: 0-10 Pain Score: 5  Pain Location: back Pain Descriptors / Indicators: Grimacing, Guarding, Aching Pain Intervention(s): Limited activity within patient's tolerance, Monitored during session, Repositioned,  Premedicated before session  Home Living                                          Prior Functioning/Environment              Frequency  Min 3X/week        Progress Toward Goals  OT Goals(current goals can now be found in the care plan section)  Progress towards OT goals: OT to reassess next treatment  Acute Rehab OT Goals Patient Stated Goal: go back home and have less pain OT Goal Formulation: With patient/family Time For Goal Achievement: 04/04/23 Potential to Achieve Goals: Good  Plan Discharge plan remains appropriate;Frequency remains appropriate    Co-evaluation                 AM-PAC OT "6 Clicks" Daily Activity     Outcome Measure   Help from another person eating meals?: None Help from another person taking care of personal grooming?: A Little Help from another person toileting, which includes using toliet, bedpan, or urinal?: A Lot Help from another person bathing (including washing, rinsing, drying)?: A Lot Help from another person to put on and taking off regular upper body clothing?: A Lot Help from another person to put on and taking off regular lower body clothing?: A Lot 6 Click Score: 15    End of Session    OT Visit Diagnosis: Other abnormalities of gait and mobility (R26.89);Muscle weakness (generalized) (M62.81);History of falling (Z91.81);Pain Pain - part of body:  (back)   Activity Tolerance Patient limited by fatigue;Patient limited by pain   Patient Left in bed;with call bell/phone within reach;with bed alarm set;with family/visitor present;with SCD's reapplied;Other (comment)   Nurse Communication Other (comment) (dtr noting earlier decr hand eye coordination)        Time: 4098-1191 OT Time Calculation (min): 24 min  Charges: OT General Charges $OT Visit: 1 Visit OT Treatments $Self Care/Home Management : 23-37 mins  Arman Filter., MPH, MS, OTR/L ascom [phone] 03/23/23, 4:03 PM

## 2023-03-23 NOTE — Care Management Important Message (Signed)
Important Message  Patient Details  Name: Sheryl Michael MRN: [id number] Date of Birth: 05-Feb-1931   Medicare Important Message Given:  Yes     [name] 03/23/2023, 1:43 PM

## 2023-03-23 NOTE — TOC Initial Note (Signed)
Transition of Care [facility]) - Initial/Assessment Note    Patient Details  Name: Sheryl Michael MRN: [id number] Date of Birth: 24-Jun-1931  [facility] CM/SW Contact:    Truddie Hidden, RN Phone Number: 03/23/2023, 4:01 PM  Clinical Narrative:                 Patient is from [facility].  Per physical therapy she had been recommended to received SNF. Spoke with patient's son, Perlie Gold, he and siblings are agreeable to SNF but do not have a preference at this time.  He was referred to Medicare.gov and also provided with names of [facility] facilities.  He will discuss facilities with his siblings and make a decision.         Patient Goals and CMS Choice            Expected Discharge Plan and Services                                              Prior Living Arrangements/Services                       Activities of Daily Living Home Assistive Devices/Equipment: Eyeglasses, Dan Humphreys (specify type) ADL Screening (condition at time of admission) Patient's cognitive ability adequate to safely complete daily activities?: No Is the patient deaf or have difficulty hearing?: No Does the patient have difficulty seeing, even when wearing glasses/contacts?: No Does the patient have difficulty concentrating, remembering, or making decisions?: No Patient able to express need for assistance with ADLs?: Yes Does the patient have difficulty dressing or bathing?: Yes Independently performs ADLs?: No Communication: Dependent Is this a change from baseline?: Pre-admission baseline Dressing (OT): Dependent Is this a change from baseline?: Pre-admission baseline Grooming: Dependent Is this a change from baseline?: Pre-admission baseline Feeding: Dependent Is this a change from baseline?: Pre-admission baseline Bathing: Dependent Is this a change from baseline?: Pre-admission baseline Toileting: Dependent Is this a change from baseline?: Pre-admission  baseline In/Out Bed: Dependent Is this a change from baseline?: Pre-admission baseline Walks in Home: Dependent Is this a change from baseline?: Pre-admission baseline Does the patient have difficulty walking or climbing stairs?: Yes Weakness of Legs: Both Weakness of Arms/Hands: Both  Permission Sought/Granted                  Emotional Assessment              Admission diagnosis:  Atrial fibrillation with RVR (HCC) [I48.91] Thoracic spine fracture (HCC) [S22.009A] Inadequate pain control [R52] Acute bilateral low back pain without sciatica [M54.50] Closed fracture of eleventh thoracic vertebra, unspecified fracture morphology, initial encounter (HCC) [S22.089A] Patient Active Problem List   Diagnosis Date Noted   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   Kyphosis 03/19/2023   Closed tricolumnar fracture of thoracic vertebra (HCC) 03/19/2023   Thoracic spine fracture (HCC) 03/18/2023   Inadequate pain control 03/17/2023   Closed T11 fracture (HCC) 03/17/2023   Depression 03/17/2023   Genetic testing 05/04/2021   History of breast cancer 08/27/2020   Family history of breast cancer    Carcinoma of lower-outer quadrant of left breast in female, estrogen receptor positive (HCC) 07/23/2020   Recurrent UTI 05/05/2020   Acute pyelonephritis 05/05/2020   Acute kidney injury superimposed on chronic kidney disease (HCC) 05/05/2020   History of recurrent  UTIs 03/07/2020   Sepsis (HCC) 03/05/2020   Strep throat 12/19/2018   Allergic reaction 12/19/2018   Sprain of interphalangeal joint of right ring finger 07/30/2018   Traumatic complete tear of left rotator cuff 07/16/2018   Strain of left hip 07/16/2018   Encounter for Hemoccult screening 03/12/2018   Hypothyroidism, acquired 07/10/2017   Arrhythmia 05/31/2017   Atrial fibrillation with RVR (HCC) 05/30/2017   Dyspnea 05/30/2017   Rotator cuff tendinitis, right 12/05/2016   Shingles 12/29/2015   S/P VH (vaginal  hysterectomy) 12/22/2015   Cystocele 12/21/2015   Colon polyp 10/07/2015   Bladder cystocele 10/07/2015   Lower esophageal ring 10/07/2015   H/O neoplasm 08/17/2015   History of nonmelanoma skin cancer 08/17/2015   Malignant neoplasm of breast (HCC) 04/01/2014   GERD (gastroesophageal reflux disease) 04/01/2014   HLD (hyperlipidemia) 04/01/2014   OP (osteoporosis) 04/01/2014   Temporary cerebral vascular dysfunction 04/01/2014   H/O gastrointestinal disease 02/28/2014   PCP:  Marguarite Arbour, MD Pharmacy:   [facility] - [place], [place] - [address] [place] [address] Phone: [phone] Fax: [phone]  [id number] - [place], [place] - [address] AT [facility] [address] [place] [address] Phone: [phone] Fax: [phone]  [id number] - [place] [facility] - [address] [place] [address] Phone: [phone] Fax: [phone]     Social Determinants of Health (SDOH) Social History: SDOH Screenings   Tobacco Use: Low Risk  (03/20/2023)   SDOH Interventions:     Readmission Risk Interventions     No data to display

## 2023-03-23 NOTE — Plan of Care (Signed)

## 2023-03-23 NOTE — Progress Notes (Signed)
Progress Note   Patient: Sheryl Michael EAV:[id number] DOB: 11/20/1930 DOA: 03/17/2023     6 DOS: the patient was seen and examined on 03/23/2023      Subjective:  I saw patient this morning on rounds Daughter was present at bedside She admits to some improvement in her back pain Denies chest pain nausea or vomiting Yet to get out of bed with PT OT  Brief Narrative / Hospital Course:  [name] is a 89 year old female history of atrial fibrillation, hypertension, hyperlipidemia, hypothyroid. She presents emergency department 03/17/23 from [facility] assisted living for chief concerns of back pain of unknown etiology. Question trauma as she was found on the floor. She does not think that she fell, she thought that she just laid down on the floor because her back was hurting her.  She denies any neurological symptoms currently other than pain. 05/31: MRI shows T11 and likely L1 fx are acute, and do not suggest obvious instability. Neurosurgery advised TLSO and T-spine upright xrays once in brace to evaluate alignment 06/01: repeat XR show increasing angulation, planning for surgery for spinal fusion Monday following Xarelto washout. Afib w/ intermittent RVR, IV dilt push ineffective, started dilt gtt and converted to NSR. Paused gtt , increased po dose dilt, no further events until...  06/02: Again in rapid Afib early AM, restarted dilt gtt + heparin gtt anticipating surgery.  06/03: T8-L2 posterior spinal fusion w/ T8-9 and L1-2 cement augmentation, for T11 and L1 fractures.  06/04: NSR off dilt gtt. Stable post-op, PT/OT to see.       ASSESSMENT & PLAN:   Principal Problem:   Closed T11 fracture (HCC) Active Problems:   Malignant neoplasm of breast (HCC)   GERD (gastroesophageal reflux disease)   HLD (hyperlipidemia)   OP (osteoporosis)   Hypothyroidism, acquired   Carcinoma of lower-outer quadrant of left breast in female, estrogen receptor positive (HCC)   Inadequate pain  control   Depression     Closed T11, L1 fractures [facility] postsurgery MRI shows only T11 and likely L1 fx are acute, and do not suggest instability. Patient underwent  T8-L2 posterior spinal fusion w/ T8-9 and L1-2 cement augmentation, for T11 and L1 fractures.  By neurosurgeon on 03/21/2023 PT OT on board we appreciate input Continue current pain management currently requiring oxycodone We will taper this down to avoid sedation and drowsiness   Afib RVR onset 03/18/23 - resolved  Paroxysmal Afib Xarelto at home - resumed DVT ppx here so restarted Lovenox, would avoid therapeutic anticoagulation for at least 14 days post-op per Dr Myer Haff unless life-threatening need for it    Malignant neoplasm of breast continue anastrozole 1 mg daily resumed   HLD (hyperlipidemia) Continue atorvastatin 40 mg daily resumed   Hypothyroidism, acquired Continue levothyroxine 50 mcg daily resumed   Depression, Dementia Continue escitalopram 10 mg daily and memantine 5 mg po daily resumed         DVT prophylaxis: lovenox (plan restart Xarelto on 06/17) Pertinent IV fluids/nutrition: no continuous IV fluids  Central lines / invasive devices: none   Code Status: FULL CODE     TOC needs / Dispo plan: Skilled nursing facility   Barriers to discharge / significant pending items: Significant back pain needing IV pain medication     Family Communication: Daughter present at bedside       Physical Exam Constitutional:      General: She is not in acute distress.    Appearance: She  is not toxic-appearing.  Cardiovascular:     Rate and Rhythm: Normal rate and regular rhythm.  Pulmonary:     Effort: Pulmonary effort is normal.     Breath sounds: Normal breath sounds.     Comments: Exam limited d/t brace  Musculoskeletal:     Right lower leg: No edema.     Left lower leg: No edema.  Neurological:     Mental Status: She is alert. Mental status is at baseline.  Psychiatric:        Mood and  Affect: Mood normal.        Behavior: Behavior normal.        Data Reviewed: Have reviewed patient's data showing sodium of 137 potassium 3.8 creatinine 0.71 WBC 6.6 hemoglobin 10.7  I have requested CBC and BMP for follow-up  Vitals:   03/23/23 0734 03/23/23 1203 03/23/23 1556 03/23/23 1610  BP: (!) 96/53 (!) 127/103 (!) 96/44 (!) 94/46  Pulse: 66 (!) 110  70  Resp: 16 16  16   Temp: 98.7 F (37.1 C) 98.6 F (37 C)  98 F (36.7 C)  TempSrc:  Oral  Oral  SpO2: 95% 93%  92%  Weight:      Height:         Author: Loyce Dys, MD 03/23/2023 4:18 PM  For on call review [email or web address].

## 2023-03-23 NOTE — Discharge Instructions (Signed)
NEUROSURGERY DISCHARGE INSTRUCTIONS  Admission diagnosis: Atrial fibrillation with RVR (HCC) [I48.91] Thoracic spine fracture (HCC) [S22.009A] Inadequate pain control [R52] Acute bilateral low back pain without sciatica [M54.50] Closed fracture of eleventh thoracic vertebra, unspecified fracture morphology, initial encounter (HCC) [S22.089A]  Operative procedure: Spinal fusion  What to do after you leave the hospital:  Recommended diet: Increase protein intake to promote wound healing.  Recommended activity: no lifting, driving, or strenuous exercise for 4 weeks .You should walk multiple times per day  Special Instructions  No straining, no heavy lifting > 10lbs x 4 weeks.  Keep incision area clean and dry. May shower in 2 days. No baths or pools for 6 weeks.  Please remove dressing tomorrow, no need to apply a bandage afterwards  You have no sutures to remove, the skin is closed with adhesive  Please take pain medications as directed. Take a stool softener if on pain medications   Please Report any of the following: Nausea or Vomiting, Temperature is greater than 101.53F (38.1C) degrees, Dizziness, Abdominal Pain, Difficulty Breathing or Shortness of Breath, Inability to Eat, drink Fluids, or Take medications, Bleeding, swelling, or drainage from surgical incision sites, New numbness or weakness, and Bowel or bladder dysfunction to the neurosurgeon on call at [phone]  Additional Follow up appointments Please follow up with Manning Charity PA-C as scheduled in 2-3 weeks   Please see below for scheduled appointments:  Future Appointments  Date Time Provider Department Center  04/04/2023  1:30 PM Susanne Borders, PA [facility] None  04/05/2023 11:00 AM [facility]-DIAG 1 [facility]  04/05/2023 11:20 AM [facility]-US 1 [facility]  05/02/2023  2:30 PM Venetia Night, MD CNS-CNS None  06/08/2023  1:30 PM Susanne Borders, PA CNS-CNS None  06/23/2023  1:30 PM [facility] LAB  [facility] None  06/23/2023  1:45 PM Earna Coder, MD [facility] None

## 2023-03-23 NOTE — Progress Notes (Signed)
   Neurosurgery Progress Note  History: Sheryl Michael is s/p T8-L2 PSF with T8-9 and L1-2 cement augmentation for T11 and L1 fractures.   POD3: NAEO. Continues to have significant back pain.  Patient is in A-fib. POD2: Worsening back pain this morning POD1: Reports good pain control this morning  Physical Exam: Vitals:   03/23/23 0300 03/23/23 0734  BP: (!) 131/40 (!) 96/53  Pulse: 72 66  Resp: 14 16  Temp: 98.2 F (36.8 C) 98.7 F (37.1 C)  SpO2: 95% 95%    Resting but arouses to voice.  Strength: MAEW Wound vac in place  HV output not recorded overnight  Data:  Other tests/results: NA  Assessment/Plan:  Sheryl Michael is a 89 y.o presenting with unstable T11 fracture s/p PSF.   - mobilize - pain control - continue incisional vac -Will remove Hemovac this afternoon. - DVT prophylaxis - PTOT - remainder of care per primary team.   Manning Charity PA-C Department of Neurosurgery

## 2023-03-23 NOTE — Evaluation (Signed)
Physical Therapy Evaluation Patient Details Name: Sheryl Michael MRN: [id number] DOB: [birth year] Today's Date: 03/23/2023  History of Present Illness  Sheryl Michael is an 89yoF who comes to [facility] 5/31, found on floor by staff at [facility] ILF. PMH: AF, HTN, HLD, hypoTSH. Thoracic MRI revealing of acute T9, T10 fractures, unstable T11 body fracture, acute T1 fracture. Pt taken to OR with neurosurgical Dr. Myer Haff, is now s/p T8-L2 PSF with T8-9 and L1-2 cement augmentation for T11 and L1 fractures.  Clinical Impression  Pt seen for evaluation, vitals more stable today. Pt appears more comfortable today upon entry, but generally still distressed, confused, perseverative. She's struggling to find words to discuss her pain. She becomes anxious and panicky when the [facility] is adjusted more than 5 degrees at a time. He pain is so limiting right now that she is not even willing to try to feed herself, however we make it a goal to reposition in bed so self-feeding can be facilitated. Pt unable to heel slide bilat to assist. Total assist to scoot toward HOB. Pt gradually achieves HOB at 40 degrees through incremental bed adjustments and pauses between to allow for postural pain improvements. DTR and neurosurgery in room at end of session. Will check back in later today once pan meds have been optimized and meal completed.        Recommendations for follow up therapy are one component of a multi-disciplinary discharge planning process, led by the attending physician.  Recommendations may be updated based on patient status, additional functional criteria and insurance authorization.  Follow Up Recommendations Can patient physically be transported by private vehicle: No     Assistance Recommended at Discharge Frequent or constant Supervision/Assistance  Patient can return home with the following  Two people to help with bathing/dressing/bathroom;Two people to help with walking and/or transfers;Assist for  transportation    Equipment Recommendations None recommended by PT  Recommendations for Other Services       Functional Status Assessment Patient has had a recent decline in their functional status and demonstrates the ability to make significant improvements in function in a reasonable and predictable amount of time.     Precautions / Restrictions Precautions Precautions: Back;Fall Precaution Booklet Issued: No Required Braces or Orthoses: Spinal Brace Spinal Brace: Thoracolumbosacral orthotic;Applied in sitting position Restrictions Weight Bearing Restrictions: No      Mobility  Bed Mobility   Bed Mobility: Rolling, Supine to Sit, Sit to Supine Rolling: +2 for physical assistance, Total assist Sidelying to sit: +2 for physical assistance, Total assist Supine to sit: +2 for physical assistance, Total assist     General bed mobility comments: weak, confused, and overwhelmed by pain    Transfers                   General transfer comment: not appropriate at this time    Ambulation/Gait                  Stairs            Wheelchair Mobility    Modified Rankin (Stroke Patients Only)       Balance                                             Pertinent Vitals/Pain Pain Assessment Pain Score:  (confusion precludes use of NPRS; poor tolerance to  HOB adjustments,)    Home Living Family/patient expects to be discharged to:: Private residence     Type of Home: Independent living facility             Additional Comments: Pt just moved from home to an ILF apartment (1st fl, no steps, walk in shower) by herself in May, 2024.    Prior Function Prior Level of Function : Driving;History of Falls (last six months);Independent/Modified Independent             Mobility Comments: Pt using RW for mobility ADLs Comments: Pt reports indep with ADL, med mgt, facility provides meals and pt was driving but will not be driving  anymore     Hand Dominance        Extremity/Trunk Assessment                Communication      Cognition   Behavior During Therapy:  (drowsy, confused, difficulty answering questions; repeatedly saying the same exclamation for my visit.)                                            General Comments      Exercises     Assessment/Plan    PT Assessment Patient needs continued PT services  PT Problem List Decreased strength;Decreased range of motion;Decreased activity tolerance;Decreased balance;Decreased mobility;Decreased coordination;Decreased cognition;Decreased knowledge of precautions       PT Treatment Interventions DME instruction;Gait training;Stair training;Functional mobility training;Therapeutic activities;Therapeutic exercise;Balance training;Patient/family education;Wheelchair mobility training;Cognitive remediation    PT Goals (Current goals can be found in the Care Plan section)  Acute Rehab PT Goals Patient Stated Goal: regain strength and independent mobility PT Goal Formulation: With family Time For Goal Achievement: 04/06/23 Potential to Achieve Goals: Poor    Frequency Min 4X/week (can update later when able to tolerate more.)     Co-evaluation               AM-PAC PT "6 Clicks" Mobility  Outcome Measure Help needed turning from your back to your side while in a flat bed without using bedrails?: Total Help needed moving from lying on your back to sitting on the side of a flat bed without using bedrails?: Total Help needed moving to and from a bed to a chair (including a wheelchair)?: Total Help needed standing up from a chair using your arms (e.g., wheelchair or bedside chair)?: Total Help needed to walk in hospital room?: Total Help needed climbing 3-5 steps with a railing? : Total 6 Click Score: 6    End of Session   Activity Tolerance: Patient limited by lethargy;Patient limited by pain Patient left: in bed;with  family/visitor present;with nursing/sitter in room;with SCD's reapplied;with call bell/phone within reach Nurse Communication: Mobility status PT Visit Diagnosis: Difficulty in walking, not elsewhere classified (R26.2);Other abnormalities of gait and mobility (R26.89);Muscle weakness (generalized) (M62.81);Other symptoms and signs involving the nervous system (R29.898)    Time: 1610-9604 PT Time Calculation (min) (ACUTE ONLY): 15 min   Charges:   PT Evaluation $PT Eval Moderate Complexity: 1 Mod        9:18 AM, 03/23/23 [name], PT, DPT Physical Therapist - [facility]  [phone] (ASCOM)    Codee Tutson C 03/23/2023, 9:14 AM

## 2023-03-24 DIAGNOSIS — S22081D Stable burst fracture of T11-T12 vertebra, subsequent encounter for fracture with routine healing: Secondary | ICD-10-CM | POA: Diagnosis not present

## 2023-03-24 LAB — CBC WITH DIFFERENTIAL/PLATELET
Abs Immature Granulocytes: 0.02 10*3/uL (ref 0.00–0.07)
Basophils Absolute: 0 10*3/uL (ref 0.0–0.1)
Basophils Relative: 1 %
Eosinophils Absolute: 0.2 10*3/uL (ref 0.0–0.5)
Eosinophils Relative: 3 %
HCT: 33.3 % — ABNORMAL LOW (ref 36.0–46.0)
Hemoglobin: 10.9 g/dL — ABNORMAL LOW (ref 12.0–15.0)
Immature Granulocytes: 0 %
Lymphocytes Relative: 9 %
Lymphs Abs: 0.6 10*3/uL — ABNORMAL LOW (ref 0.7–4.0)
MCH: 31.8 pg (ref 26.0–34.0)
MCHC: 32.7 g/dL (ref 30.0–36.0)
MCV: 97.1 fL (ref 80.0–100.0)
Monocytes Absolute: 0.3 10*3/uL (ref 0.1–1.0)
Monocytes Relative: 5 %
Neutro Abs: 5.2 10*3/uL (ref 1.7–7.7)
Neutrophils Relative %: 82 %
Platelets: 139 10*3/uL — ABNORMAL LOW (ref 150–400)
RBC: 3.43 MIL/uL — ABNORMAL LOW (ref 3.87–5.11)
RDW: 14.8 % (ref 11.5–15.5)
WBC: 6.3 10*3/uL (ref 4.0–10.5)
nRBC: 0 % (ref 0.0–0.2)

## 2023-03-24 LAB — BASIC METABOLIC PANEL
Anion gap: 7 (ref 5–15)
BUN: 15 mg/dL (ref 8–23)
CO2: 22 mmol/L (ref 22–32)
Calcium: 7.8 mg/dL — ABNORMAL LOW (ref 8.9–10.3)
Chloride: 110 mmol/L (ref 98–111)
Creatinine, Ser: 0.64 mg/dL (ref 0.44–1.00)
GFR, Estimated: >60 mL/min (ref >60)
Glucose, Bld: 95 mg/dL (ref 70–99)
Potassium: 3.3 mmol/L — ABNORMAL LOW (ref 3.5–5.1)
Sodium: 139 mmol/L (ref 135–145)

## 2023-03-24 LAB — TROPONIN I (HIGH SENSITIVITY)
Troponin I (High Sensitivity): 11 ng/L (ref <18)
Troponin I (High Sensitivity): 9 ng/L (ref <18)

## 2023-03-24 MED ORDER — POLYETHYLENE GLYCOL 3350 17 G PO PACK
17.0000 g | PACK | Freq: Once | ORAL | Status: AC
  Administered 2023-03-24: 17 g via ORAL
  Filled 2023-03-24: qty 1

## 2023-03-24 MED ORDER — POTASSIUM CHLORIDE CRYS ER 20 MEQ PO TBCR
40.0000 meq | EXTENDED_RELEASE_TABLET | ORAL | Status: AC
  Administered 2023-03-24 (×2): 40 meq via ORAL
  Filled 2023-03-24: qty 2

## 2023-03-24 NOTE — Progress Notes (Signed)
Occupational Therapy Treatment Patient Details Name: Sheryl Michael MRN: [id number] DOB: 18-Jul-1931 Today's Date: 03/24/2023   History of present illness Sheryl Michael is an 89yoF who comes [facility] 5/[facility], found on floor by staff at [facility] ILF. PMH: AF, HTN, HLD, hypoTSH. Thoracic MRI revealing of acute T9, T10 fractures, unstable T11 body fracture, acute T1 fracture. Pt taken to OR with neurosurgical Dr. Myer Haff, is now s/p T8-L2 PSF with T8-9 and L1-2 cement augmentation for T11 and L1 fractures.   OT comments  Pt seen for limited OT tx session. Pt very drowsy, RN in to get blood draw to check troponins due to pt noting chest pain earlier this morning. Pt wakes briefly to OT's voice but very limited otherwise. Caregiver education provided to son who was present for session regarding discharge planning, therapy goals while admitted, therapy beyond the hospital, and pt's goals for what would need to be accomplished prior to return home. Son verbalized understanding. Care management notified of son's questions regarding process and next steps. Will continue to progress as able.    Recommendations for follow up therapy are one component of a multi-disciplinary discharge planning process, led by the attending physician.  Recommendations may be updated based on patient status, additional functional criteria and insurance authorization.    Assistance Recommended at Discharge Frequent or constant Supervision/Assistance  Patient can return home with the following  Two people to help with walking and/or transfers;A lot of help with bathing/dressing/bathroom;Assistance with cooking/housework;Help with stairs or ramp for entrance;Assist for transportation;Direct supervision/assist for medications management;Direct supervision/assist for financial management   Equipment Recommendations  BSC/3in1    Recommendations for Other Services      Precautions / Restrictions Precautions Precautions:  Back;Fall Required Braces or Orthoses: Spinal Brace Spinal Brace: Thoracolumbosacral orthotic;Applied in sitting position Spinal Brace Comments: for OOB Restrictions Weight Bearing Restrictions: No       Mobility Bed Mobility               General bed mobility comments: deferred, RN in for blood draw    Transfers                         Balance                                           ADL either performed or assessed with clinical judgement   ADL                                              Extremity/Trunk Assessment              Vision       Perception     Praxis      Cognition Arousal/Alertness: Lethargic, Suspect due to medications   Overall Cognitive Status: Impaired/Different from baseline                                 General Comments: Pt very sleepy, will briefly open eyes to speak when spoken to        Exercises Other Exercises Other Exercises: Caregiver education provided to son who was present for session regarding discharge planning, therapy  goals while admitted, therapy beyond the hospital, and pt's goals for what would need to be accomplished prior to return home.    Shoulder Instructions       General Comments      Pertinent Vitals/ Pain       Pain Assessment Pain Assessment: Faces Faces Pain Scale: No hurt  Home Living                                          Prior Functioning/Environment              Frequency  Min 3X/week        Progress Toward Goals  OT Goals(current goals can now be found in the care plan section)  Progress towards OT goals: OT to reassess next treatment  Acute Rehab OT Goals Patient Stated Goal: go back home and have less pain OT Goal Formulation: With patient/family Time For Goal Achievement: 04/04/23 Potential to Achieve Goals: Good  Plan Discharge plan remains appropriate;Frequency remains  appropriate    Co-evaluation                 AM-PAC OT "6 Clicks" Daily Activity     Outcome Measure   Help from another person eating meals?: A Little Help from another person taking care of personal grooming?: A Little Help from another person toileting, which includes using toliet, bedpan, or urinal?: A Lot Help from another person bathing (including washing, rinsing, drying)?: A Lot Help from another person to put on and taking off regular upper body clothing?: A Lot Help from another person to put on and taking off regular lower body clothing?: A Lot 6 Click Score: 14    End of Session    OT Visit Diagnosis: Other abnormalities of gait and mobility (R26.89);Muscle weakness (generalized) (M62.81);History of falling (Z91.81);Pain Pain - part of body:  (back)   Activity Tolerance Patient limited by fatigue;Patient limited by lethargy;Treatment limited secondary to medical complications (Comment) (pt endorsed chest pain prior to session)   Patient Left in bed;with call bell/phone within reach;with bed alarm set;with family/visitor present   Nurse Communication          Time: 1610-9604 OT Time Calculation (min): 14 min  Charges: OT General Charges $OT Visit: 1 Visit OT Treatments $Therapeutic Activity: 8-22 mins  Arman Filter., MPH, MS, OTR/L ascom [phone] 03/24/23, 12:10 PM

## 2023-03-24 NOTE — TOC CM/SW Note (Signed)
RE: Sheryl Michael Date of Birth: 1931/08/18 Date: 03/24/2023   To Whom It May Concern:  Please be advised that the above-named patient will require a short-term nursing home stay - anticipated 30 days or less for rehabilitation and strengthening.  The plan is for return home.

## 2023-03-24 NOTE — Progress Notes (Signed)
   Neurosurgery Progress Note  History: JAZAE GANDOLFI is s/p T8-L2 PSF with T8-9 and L1-2 cement augmentation for T11 and L1 fractures.   POD4: Pt complaining of chest pain thus morning POD3: NAEO. Continues to have significant back pain.  Patient is in A-fib. POD2: Worsening back pain this morning POD1: Reports good pain control this morning  Physical Exam: Vitals:   03/24/23 0400 03/24/23 0700  BP: (!) 114/39 (!) 114/39  Pulse: 90 75  Resp: 13 16  Temp: 97.8 F (36.6 C) 98 F (36.7 C)  SpO2:      Awake. Oriented to self Strength: MAEW Wound vac in place   Data:  Other tests/results: NA  Assessment/Plan:  ERIK NESSEL is a 89 y.o presenting with unstable T11 fracture s/p PSF.   - mobilize - pain control - continue incisional vac -HV removed 6/6 - DVT prophylaxis - PTOT - remainder of care per primary team. Dr. Meriam Sprague made aware of chest pain and planning to order EKG and trop  Manning Charity PA-C Department of Neurosurgery

## 2023-03-24 NOTE — Progress Notes (Signed)
Physical Therapy Treatment Patient Details Name: Sheryl Michael MRN: [id number] DOB: 28-Sep-1931 Today's Date: 03/24/2023   History of Present Illness Sheryl Michael is an 89yoF who comes to [facility] 5/31, found on floor by staff at [facility] ILF. PMH: AF, HTN, HLD, hypoTSH. Thoracic MRI revealing of acute T9, T10 fractures, unstable T11 body fracture, acute T1 fracture. Pt taken to OR with neurosurgical Dr. Myer Haff, is now s/p T8-L2 PSF with T8-9 and L1-2 cement augmentation for T11 and L1 fractures.    PT Comments    Pt received in Semi-Fowler's position and agreeable to therapy.  Pt and son were educated on discharge planning and reiterated therapy goals while being admitted to the hospital.  Pt much more alert than in prior session with OT and was able to assist and communicate more with therapist.  Pt still required significant help getting to the EOB, however was able to participate better with standing exercise at EOB.  Pt did require +2 but unlikely needed, just for pt's comfort level.  Son assisted with her on the R side and therapist on the L.  Pt able to stand with +2 modA and remain in standing position for several minutes while pericare was performed.  Pt then transitioned back to bed with +2 assistance from therapist and NT in order to abide by precautions.  Pt repositioned in bed and was left with all needs met.  Will continue to work with therapy in order to progress mobility as able.      Recommendations for follow up therapy are one component of a multi-disciplinary discharge planning process, led by the attending physician.  Recommendations may be updated based on patient status, additional functional criteria and insurance authorization.  Follow Up Recommendations  Can patient physically be transported by private vehicle: No    Assistance Recommended at Discharge Frequent or constant Supervision/Assistance  Patient can return home with the following Assist for transportation;Two  people to help with walking and/or transfers;A lot of help with bathing/dressing/bathroom   Equipment Recommendations  None recommended by PT    Recommendations for Other Services       Precautions / Restrictions Precautions Precautions: Back;Fall Required Braces or Orthoses: Spinal Brace Spinal Brace: Thoracolumbosacral orthotic;Applied in sitting position Spinal Brace Comments: for OOB Restrictions Weight Bearing Restrictions: No     Mobility  Bed Mobility Overal bed mobility: Needs Assistance Bed Mobility: Rolling, Supine to Sit, Sit to Supine Rolling: Total assist Sidelying to sit: Total assist     Sit to sidelying: Total assist, +2 for physical assistance      Transfers Overall transfer level: Needs assistance Equipment used: Rolling walker (2 wheels) Transfers: Sit to/from Stand Sit to Stand: Mod assist, +2 physical assistance, +2 safety/equipment, From elevated surface           General transfer comment: pt performed STS with good technique after education and son assisting on the R side.    Ambulation/Gait         Gait velocity: decreased     General Gait Details: deferred full ambulation but pt was able to side-step with good techniques.   Stairs             Wheelchair Mobility    Modified Rankin (Stroke Patients Only)       Balance Overall balance assessment: Needs assistance Sitting-balance support: Single extremity supported, Bilateral upper extremity supported, Feet supported Sitting balance-Leahy Scale: Fair     Standing balance support: Bilateral upper extremity supported, During functional  activity, Reliant on assistive device for balance Standing balance-Leahy Scale: Poor                              Cognition Arousal/Alertness: Lethargic, Suspect due to medications   Overall Cognitive Status: Impaired/Different from baseline                                 General Comments: Pt slightly  more awake upon this session than in prior.        Exercises Total Joint Exercises Ankle Circles/Pumps: AROM, Strengthening, Both, 10 reps, Supine Quad Sets: AROM, Strengthening, Both, 10 reps, Supine Gluteal Sets: AROM, Strengthening, Both, 10 reps, Supine Hip ABduction/ADduction: AROM, Strengthening, Both, 10 reps, Supine    General Comments        Pertinent Vitals/Pain Pain Assessment Pain Assessment: Faces Faces Pain Scale: Hurts even more Pain Location: back Pain Descriptors / Indicators: Grimacing, Guarding, Aching Pain Intervention(s): Limited activity within patient's tolerance, Monitored during session, Repositioned, Premedicated before session    Home Living                          Prior Function            PT Goals (current goals can now be found in the care plan section) Acute Rehab PT Goals Patient Stated Goal: regain strength and independent mobility PT Goal Formulation: With family Time For Goal Achievement: 04/06/23 Potential to Achieve Goals: Poor Progress towards PT goals: Progressing toward goals    Frequency    Min 4X/week      PT Plan Current plan remains appropriate    Co-evaluation              AM-PAC PT "6 Clicks" Mobility   Outcome Measure  Help needed turning from your back to your side while in a flat bed without using bedrails?: Total Help needed moving from lying on your back to sitting on the side of a flat bed without using bedrails?: Total Help needed moving to and from a bed to a chair (including a wheelchair)?: Total Help needed standing up from a chair using your arms (e.g., wheelchair or bedside chair)?: A Lot Help needed to walk in hospital room?: Total Help needed climbing 3-5 steps with a railing? : Total 6 Click Score: 7    End of Session   Activity Tolerance: Patient limited by lethargy;Patient limited by pain Patient left: in bed;with call bell/phone within reach;with bed alarm set;with  family/visitor present Nurse Communication: Mobility status PT Visit Diagnosis: Difficulty in walking, not elsewhere classified (R26.2);Other abnormalities of gait and mobility (R26.89);Muscle weakness (generalized) (M62.81);Other symptoms and signs involving the nervous system (R29.898)     Time: 1610-9604 PT Time Calculation (min) (ACUTE ONLY): 46 min  Charges:  $Therapeutic Exercise: 8-22 mins $Therapeutic Activity: 23-37 mins                     Nolon Bussing, PT, DPT Physical Therapist - [facility]  03/24/23, 4:41 PM

## 2023-03-24 NOTE — TOC Progression Note (Addendum)
Transition of Care [facility]) - Progression Note    Patient Details  Name: Sheryl Michael MRN: [id number] Date of Birth: 1931-04-16  [facility] CM/SW Contact  Margarito Liner, LCSW Phone Number: 03/24/2023, 12:07 PM  Clinical Narrative:   Uploaded requested clinicals into [place] [facility] for PASARR review.  1:29 pm: PASARR obtained: [phone] A. Met with patient, son, and daughter-in-law. Provided bed offers so far. Patient has a daughter that lives in [facility]. They are considering placement there. CSW gave CMS scores for facilities within 25 miles of her [facility] zip code. CSW asked them to pick top 3 preferences for [facility] to fax referrals to. Patient will likely need EMS transport. CSW made them aware that insurance may or may not cover transport that far, potential for private pay transport.  Expected Discharge Plan and Services                                               Social Determinants of Health (SDOH) Interventions SDOH Screenings   Tobacco Use: Low Risk  (03/20/2023)    Readmission Risk Interventions     No data to display

## 2023-03-24 NOTE — Progress Notes (Signed)
Progress Note   Patient: Sheryl Michael RUE:[id number] DOB: 1930/11/14 DOA: 03/17/2023     7 DOS: the patient was seen and examined on 03/24/2023      Subjective:  Patient seen this morning in the presence of the son She tells me back pain is improving Did have an episode of chest pain this morning however EKG did not show any ischemic findings Troponin within normal limit I have discussed with case manager who is working on bed availability   Brief Narrative / Hospital Course:  [name] is a 89 year old female history of atrial fibrillation, hypertension, hyperlipidemia, hypothyroid. She presents emergency department 03/17/23 from [facility] assisted living for chief concerns of back pain of unknown etiology. Question trauma as she was found on the floor. She does not think that she fell, she thought that she just laid down on the floor because her back was hurting her.  She denies any neurological symptoms currently other than pain. 05/31: MRI shows T11 and likely L1 fx are acute, and do not suggest obvious instability. Neurosurgery advised TLSO and T-spine upright xrays once in brace to evaluate alignment 06/01: repeat XR show increasing angulation, planning for surgery for spinal fusion Monday following Xarelto washout. Afib w/ intermittent RVR, IV dilt push ineffective, started dilt gtt and converted to NSR. Paused gtt , increased po dose dilt, no further events until...  06/02: Again in rapid Afib early AM, restarted dilt gtt + heparin gtt anticipating surgery.  06/03: T8-L2 posterior spinal fusion w/ T8-9 and L1-2 cement augmentation, for T11 and L1 fractures.  06/04: NSR off dilt gtt. Stable post-op, PT/OT to see.       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 Patient underwent  T8-L2 posterior spinal fusion w/ T8-9 and L1-2 cement augmentation, for T11 and L1 fractures.  By neurosurgeon on 03/21/2023 Continue pain regiment with oxycodone Continue PT OT Transition of care coordinating skilled nursing facility acceptance   Afib RVR onset 03/18/23 - resolved  Paroxysmal Afib Xarelto at home - resumed DVT ppx here so restarted Lovenox, would avoid therapeutic anticoagulation for at least 14 days post-op per Dr Myer Haff unless life-threatening need for it  Continue Cardizem I have added metoprolol for better rate control Continue to monitor on telemetry closely   Malignant neoplasm of breast continue anastrozole 1 mg daily resumed   HLD (hyperlipidemia) Continue atorvastatin 40 mg daily resumed   Hypothyroidism, acquired Continue levothyroxine 50 mcg daily resumed   Depression, Dementia-stable Continue escitalopram 10 mg daily and memantine 5 mg po daily resumed   Hypokalemia Patient with potassium 3.3 on 03/24/2023 Continue repletion and monitoring   DVT prophylaxis: lovenox (plan restart Xarelto on 06/17)  Pertinent IV fluids/nutrition: no continuous IV fluids   Central lines / invasive devices: none   Code Status: FULL CODE     TOC needs / Dispo plan: Skilled nursing facility   Barriers to discharge / significant pending items: Continued Medical care while awaiting skilled nursing facility bed  Family Communication: Son present at bedside       Physical Exam Constitutional:      General: She is not in acute distress.    Appearance: She is not toxic-appearing.  Cardiovascular:     Rate and Rhythm: Normal rate and regular rhythm.  Pulmonary:     Effort: Pulmonary effort is  normal.  Musculoskeletal:     Right lower leg: No edema.     Left lower leg: No edema.  Neurological:     Mental Status: She is alert. Mental status is at baseline.  Psychiatric:        Mood and Affect: Mood normal.        Behavior: Behavior normal.        Data Reviewed: I have reviewed patient's labs today showing potassium 3.3 which is being repleted sodium 139     Vitals:   03/24/23 0051 03/24/23 0400 03/24/23 0700 03/24/23 1616  BP: (!) 96/43 (!) 114/39 (!) 114/39 (!) 106/42  Pulse:  90 75 71  Resp:  13 16 18   Temp:  97.8 F (36.6 C) 98 F (36.7 C) 97.6 F (36.4 C)  TempSrc:  Oral Oral   SpO2:   94% 99%  Weight:      Height:         Author: Loyce Dys, MD 03/24/2023 5:36 PM  For on call review [email or web address].

## 2023-03-24 NOTE — NC FL2 (Signed)
[place] MEDICAID FL2 LEVEL OF CARE FORM     IDENTIFICATION  Patient Name: Sheryl Michael Birthdate: 12/20/1930 Sex: female Admission Date (Current Location): 03/17/2023  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   Kyphosis 03/19/2023   Closed tricolumnar fracture of thoracic vertebra (HCC) 03/19/2023   Thoracic spine fracture (HCC) 03/18/2023   Inadequate pain control 03/17/2023   Closed T11 fracture (HCC) 03/17/2023   Depression 03/17/2023   Genetic testing 05/04/2021   History of breast cancer 08/27/2020   Family history of breast cancer    Carcinoma of lower-outer quadrant of left breast in female, estrogen receptor positive (HCC) 07/23/2020   Recurrent UTI 05/05/2020   Acute pyelonephritis 05/05/2020   Acute kidney injury superimposed on chronic kidney disease (HCC) 05/05/2020   History of recurrent UTIs 03/07/2020   Sepsis (HCC) 03/05/2020   Strep throat 12/19/2018   Allergic reaction 12/19/2018   Sprain of interphalangeal joint of right ring finger 07/30/2018   Traumatic complete tear of left rotator cuff 07/16/2018   Strain of left hip 07/16/2018   Encounter for Hemoccult screening 03/12/2018   Hypothyroidism, acquired 07/10/2017   Arrhythmia 05/31/2017   Atrial fibrillation with RVR (HCC) 05/30/2017   Dyspnea 05/30/2017   Rotator cuff tendinitis, right 12/05/2016   Shingles 12/29/2015   S/P VH (vaginal hysterectomy) 12/22/2015    Cystocele 12/21/2015   Colon polyp 10/07/2015   Bladder cystocele 10/07/2015   Lower esophageal ring 10/07/2015   H/O neoplasm 08/17/2015   History of nonmelanoma skin cancer 08/17/2015   Malignant neoplasm of breast (HCC) 04/01/2014   GERD (gastroesophageal reflux disease) 04/01/2014   HLD (hyperlipidemia) 04/01/2014   OP (osteoporosis) 04/01/2014   Temporary cerebral vascular dysfunction 04/01/2014   H/O gastrointestinal disease 02/28/2014    Orientation RESPIRATION BLADDER Height & Weight     Self, Place  Normal Incontinent, External catheter Weight: 117 lb 4.6 oz (53.2 kg) Height:  5' (152.4 cm)  BEHAVIORAL SYMPTOMS/MOOD NEUROLOGICAL BOWEL NUTRITION STATUS   (None)  (None) Continent Diet (Regular)  AMBULATORY STATUS COMMUNICATION OF NEEDS Skin   Total Care Verbally Bruising, Surgical wounds, Wound Vac (Incision on back: Wound vac.) Neurosurgery will remove wound vac prior to discharge.                       Personal Care Assistance Level of Assistance  Bathing, Feeding, Dressing Bathing Assistance: Maximum assistance Feeding assistance: Maximum assistance Dressing Assistance: Maximum assistance     Functional Limitations Info  Sight, Speech, Hearing Sight Info: Adequate Hearing Info: Adequate Speech Info: Adequate    SPECIAL CARE FACTORS FREQUENCY  PT (By licensed PT), OT (By licensed OT)     PT Frequency: 5 x week OT Frequency: 5 x week            Contractures Contractures Info: Not present    Additional Factors Info  Code Status, Allergies Code Status Info: Full code Allergies  Info: Bactrim (Sulfamethoxazole-trimethoprim), Iodinated Contrast Media, Codeine, Erythromycin Ethylsuccinate, Phenobarbital, Ciprofloxacin, Penicillins           Current Medications (03/24/2023):  This is the current hospital active medication list Current Facility-Administered Medications  Medication Dose Route Frequency Provider Last Rate Last Admin   0.9 %  sodium  chloride infusion   Intravenous PRN Venetia Night, MD 75 mL/hr at 03/20/23 1952 New Bag at 03/20/23 2238   0.9 %  sodium chloride infusion   Intravenous Continuous Loyce Dys, MD 75 mL/hr at 03/22/23 1800 Infusion Verify at 03/22/23 1800   acetaminophen (TYLENOL) tablet 650 mg  650 mg Oral Q6H Susanne Borders, [place]   650 mg at 03/24/23 2956   Or   acetaminophen (TYLENOL) suppository 650 mg  650 mg Rectal Q6H Susanne Borders, PA       acidophilus (RISAQUAD) capsule 1 capsule  1 capsule Oral BID Venetia Night, MD   1 capsule at 03/24/23 0849   anastrozole (ARIMIDEX) tablet 1 mg  1 mg Oral Daily Venetia Night, MD   1 mg at 03/24/23 0858   atorvastatin (LIPITOR) tablet 40 mg  40 mg Oral Daily Venetia Night, MD   40 mg at 03/24/23 0848   cholecalciferol (VITAMIN D3) 25 MCG (1000 UNIT) tablet 1,000 Units  1,000 Units Oral Daily Venetia Night, MD   1,000 Units at 03/24/23 0849   diltiazem (CARDIZEM CD) 24 hr capsule 180 mg  180 mg Oral Daily Venetia Night, MD   180 mg at 03/24/23 0849   diphenhydrAMINE-zinc acetate (BENADRYL) 2-0.1 % cream   Topical TID PRN Venetia Night, MD       docusate sodium (COLACE) capsule 100 mg  100 mg Oral BID Venetia Night, MD   100 mg at 03/24/23 0849   enoxaparin (LOVENOX) injection 40 mg  40 mg Subcutaneous Q24H Sunnie Nielsen, DO   40 mg at 03/23/23 2033   escitalopram (LEXAPRO) tablet 10 mg  10 mg Oral Daily Venetia Night, MD   10 mg at 03/24/23 0850   levothyroxine (SYNTHROID) tablet 50 mcg  50 mcg Oral QAC breakfast Venetia Night, MD   50 mcg at 03/24/23 0609   lidocaine (LIDODERM) 5 % 1 patch  1 patch Transdermal Q24H Venetia Night, MD   1 patch at 03/23/23 1754   memantine (NAMENDA) tablet 5 mg  5 mg Oral BID Venetia Night, MD   5 mg at 03/24/23 0850   methocarbamol (ROBAXIN) tablet 500 mg  500 mg Oral Q8H PRN Susanne Borders, PA   500 mg at 03/23/23 2032   metoprolol tartrate (LOPRESSOR) tablet  12.5 mg  12.5 mg Oral BID Rosezetta Schlatter T, MD   12.5 mg at 03/24/23 0849   oxyCODONE (Oxy IR/ROXICODONE) immediate release tablet 10 mg  10 mg Oral Q4H PRN Susanne Borders, PA   10 mg at 03/23/23 2032   oxyCODONE (Oxy IR/ROXICODONE) immediate release tablet 5 mg  5 mg Oral Q4H PRN Venetia Night, MD   5 mg at 03/24/23 0849   pantoprazole (PROTONIX) EC tablet 40 mg  40 mg Oral Daily Venetia Night, MD   40 mg at 03/24/23 0849   polyethylene glycol (MIRALAX / GLYCOLAX) packet 17 g  17 g Oral Daily PRN Venetia Night, MD   17 g at 03/21/23 2105   potassium chloride SA (KLOR-CON M) CR tablet 40 mEq  40 mEq Oral Q4H Loyce Dys, MD       simethicone [facility]) chewable tablet 80  mg  80 mg Oral QID PRN Venetia Night, MD   80 mg at 03/21/23 1438     Discharge Medications: Please see discharge summary for a list of discharge medications.  Relevant Imaging Results:  Relevant Lab Results:   Additional Information SS#: [id number]  Margarito Liner, LCSW

## 2023-03-25 DIAGNOSIS — S22081D Stable burst fracture of T11-T12 vertebra, subsequent encounter for fracture with routine healing: Secondary | ICD-10-CM | POA: Diagnosis not present

## 2023-03-25 LAB — CBC WITH DIFFERENTIAL/PLATELET
Abs Immature Granulocytes: 0.02 10*3/uL (ref 0.00–0.07)
Basophils Absolute: 0 10*3/uL (ref 0.0–0.1)
Basophils Relative: 1 %
Eosinophils Absolute: 0.2 10*3/uL (ref 0.0–0.5)
Eosinophils Relative: 3 %
HCT: 35.6 % — ABNORMAL LOW (ref 36.0–46.0)
Hemoglobin: 11.3 g/dL — ABNORMAL LOW (ref 12.0–15.0)
Immature Granulocytes: 0 %
Lymphocytes Relative: 11 %
Lymphs Abs: 0.8 10*3/uL (ref 0.7–4.0)
MCH: 31.4 pg (ref 26.0–34.0)
MCHC: 31.7 g/dL (ref 30.0–36.0)
MCV: 98.9 fL (ref 80.0–100.0)
Monocytes Absolute: 0.7 10*3/uL (ref 0.1–1.0)
Monocytes Relative: 9 %
Neutro Abs: 6 10*3/uL (ref 1.7–7.7)
Neutrophils Relative %: 76 %
Platelets: 156 10*3/uL (ref 150–400)
RBC: 3.6 MIL/uL — ABNORMAL LOW (ref 3.87–5.11)
RDW: 14.5 % (ref 11.5–15.5)
WBC: 7.8 10*3/uL (ref 4.0–10.5)
nRBC: 0 % (ref 0.0–0.2)

## 2023-03-25 LAB — BASIC METABOLIC PANEL
Anion gap: 10 (ref 5–15)
BUN: 17 mg/dL (ref 8–23)
CO2: 19 mmol/L — ABNORMAL LOW (ref 22–32)
Calcium: 8.1 mg/dL — ABNORMAL LOW (ref 8.9–10.3)
Chloride: 109 mmol/L (ref 98–111)
Creatinine, Ser: 0.67 mg/dL (ref 0.44–1.00)
GFR, Estimated: >60 mL/min (ref >60)
Glucose, Bld: 90 mg/dL (ref 70–99)
Potassium: 3.9 mmol/L (ref 3.5–5.1)
Sodium: 138 mmol/L (ref 135–145)

## 2023-03-25 NOTE — TOC Progression Note (Signed)
Transition of Care [facility]) - Progression Note    Patient Details  Name: Sheryl Michael MRN: [id number] Date of Birth: [birth year]  Transition of Care [facility]) CM/SW Contact  Kemper Durie, RN Phone Number: 03/25/2023, 10:43 AM  Clinical Narrative:      Spoke with daughter [name], regarding facility preferences in [facility].  Discussed goal for patient to eventually go back to ILF at [facility] with increased support [facility] and personal care aide).  Per [name], since patient will only be at facility short term, she feels best to go ahead and keep patient in the [place] area.  She will look at the facilities where beds have been offered to make choice.   She would still like to have [facility] as an option, currently listed as considering.  TOC will continue to follow for discharge planning.       Expected Discharge Plan and Services                                               Social Determinants of Health (SDOH) Interventions SDOH Screenings   Tobacco Use: Low Risk  (03/20/2023)    Readmission Risk Interventions     No data to display

## 2023-03-25 NOTE — Progress Notes (Signed)
Progress Note   Patient: Sheryl Michael QQV:[id number] DOB: Mar 03, 1931 DOA: 03/17/2023     8 DOS: the patient was seen and examined on 03/25/2023    Subjective:  Patient seen and examined this morning No family present at bedside today Appears to have better pain control today According to nurses she is not requesting for pain medication often  Brief Narrative / Hospital Course:  [name] is a 89 year old female history of atrial fibrillation, hypertension, hyperlipidemia, hypothyroid. She presents emergency department 03/17/23 from [facility] assisted living for chief concerns of back pain of unknown etiology. Question trauma as she was found on the floor. She does not think that she fell, she thought that she just laid down on the floor because her back was hurting her.  She denies any neurological symptoms currently other than pain. 05/31: MRI shows T11 and likely L1 fx are acute, and do not suggest obvious instability. Neurosurgery advised TLSO and T-spine upright xrays once in brace to evaluate alignment 06/01: repeat XR show increasing angulation, planning for surgery for spinal fusion Monday following Xarelto washout. Afib w/ intermittent RVR, IV dilt push ineffective, started dilt gtt and converted to NSR. Paused gtt , increased po dose dilt, no further events until...  06/02: Again in rapid Afib early AM, restarted dilt gtt + heparin gtt anticipating surgery.  06/03: T8-L2 posterior spinal fusion w/ T8-9 and L1-2 cement augmentation, for T11 and L1 fractures.  06/04: NSR off dilt gtt. Stable post-op, PT/OT to see.       ASSESSMENT & PLAN:   Principal Problem:   Closed T11 fracture (HCC) Active Problems:   Malignant neoplasm of breast (HCC)   GERD (gastroesophageal reflux disease)   HLD (hyperlipidemia)   OP (osteoporosis)   Hypothyroidism, acquired   Carcinoma of lower-outer quadrant of left breast in female, estrogen receptor positive (HCC)   Inadequate pain control    Depression     Closed T11, L1 fractures [facility] postsurgery MRI shows only T11 and likely L1 fx are acute, and do not suggest instability. Patient underwent  T8-L2 posterior spinal fusion w/ T8-9 and L1-2 cement augmentation, for T11 and L1 fractures.  By neurosurgeon on 03/21/2023 We will continue with PT OT As needed pain control Case management working on skilled nursing facility placement   Afib RVR-improved Chronic  paroxysmal Afib Xarelto at home - resumed DVT ppx here so restarted Lovenox, would avoid therapeutic anticoagulation for at least 14 days post-op per Dr Myer Haff unless life-threatening need for it  Continue Cardizem Continue metoprolol Rate is much controlled today   Malignant neoplasm of breast continue anastrozole 1 mg daily resumed   HLD (hyperlipidemia)-stable Continue atorvastatin 40 mg daily resumed   Hypothyroidism, acquired Continue levothyroxine 50 mcg daily resumed   Depression, Dementia-stable Continue escitalopram 10 mg daily and memantine 5 mg po daily resumed   Hypokalemia Patient with potassium 3.3 on 03/24/2023 Continue repletion and monitoring   DVT prophylaxis: lovenox (plan restart Xarelto on 06/17)   Pertinent IV fluids/nutrition: no continuous IV fluids    Central lines / invasive devices: none   Code Status: FULL CODE     TOC needs / Dispo plan: Skilled nursing facility   Barriers to discharge / significant pending items: Continued Medical care while awaiting skilled nursing facility bed     Family Communication: Son present at bedside       Physical Exam Constitutional:      General: She is not in acute distress.  Appearance: She is not toxic-appearing.  Cardiovascular:     Rate and Rhythm: Normal rate and regular rhythm.  Pulmonary:     Effort: Pulmonary effort is normal.  Musculoskeletal: Dressing at the back appears clean and dry in the midline with patient was turned this morning for examination Neurological:      Mental Status: She is alert. Mental status is at baseline.  Psychiatric:        Mood and Affect: Mood normal.        Behavior: Behavior normal.        Data Reviewed: I have personally reviewed patient's labs 138 potassium 3.9 creatinine 0.6 hemoglobin 11.3 WBC 7.8      Vitals:   03/25/23 0326 03/25/23 0400 03/25/23 0815 03/25/23 1315  BP: (!) 116/37 (!) 117/38 (!) 121/43 (!) 104/37  Pulse:  60 (!) 58 (!) 53  Resp: 15 16 16 16   Temp:  97.7 F (36.5 C) 98.2 F (36.8 C) 98.3 F (36.8 C)  TempSrc:  Oral    SpO2:  99% 98% 97%  Weight:      Height:         Author: Loyce Dys, MD 03/25/2023 3:31 PM  For on call review [email or web address].

## 2023-03-25 NOTE — Progress Notes (Signed)
Neurosurgery visit note Patient seen and examined.  She denies any back pain at this time.  She is sitting in a chair wearing her brace and the Hemovac is still in place but with minimal output.  Physical exam she is somewhat slow to respond but answers questions appropriately her pupils are somewhat pinpoint and she just recently received pain medication.  Otherwise she is follows commands appropriately and answers questions appropriately.  Bilateral lower extremity examination reveals grossly intact strength and sensation.  AP: Overall I am reassured by neurologic exam.  Appreciate the primary team's ongoing guidance of her care.  She can continue to be immobilized as you are doing along with physical therapy and the nursing teams.  I instructed the nursing team to remove the Hemovac drain and simply replace it with a honeycomb dressing and Tegaderm.  We will continue to follow along  Peter Garter. Madaline Brilliant, MD Neurosurgery

## 2023-03-25 NOTE — Progress Notes (Signed)
Physical Therapy Treatment Patient Details Name: Sheryl Michael MRN: [id number] DOB: 01-31-1931 Today's Date: 03/25/2023   History of Present Illness Sheryl Michael is an 89yoF who comes to [facility] 5/31, found on floor by staff at [facility] IL[facility]. PMH: AF, HTN, HLD, hypoTSH. Thoracic MRI revealing of acute T9, T10 fractures, unstable T11 body fracture, acute T1 fracture. Pt taken to OR with neurosurgical Dr. Myer Haff, is now s/p T8-L2 PSF with T8-9 and L1-2 cement augmentation for T11 and L1 fractures.    PT Comments    Pt tolerated treatment well today, and was able to improve overall assist levels, ambulation distance, activity tolerance, and pain levels. Today, she required max-total assist for log roll technique to exit bed, mod assist for transfers, and min assist to take a few steps (~15ft) from EOB>recliner with RW.  Pt able to report 1/3 spinal precautions. She required cueing for 3/3 spinal precautions, and max multimodal cues for log roll technique to exit the bed. Further mobility limited secondary to pain and lethargy, despite being pre-medicated for session. Despite progress, pt continues to be limited with meeting goals secondary to increased pain levels, lethargy, functional weakness, decreased balance, delayed processing, and decreased activity tolerance.  Pt will continue to benefit from skilled acute PT services to address deficits for return to baseline function. Will continue to recommend post acute therapy services at DC.      Recommendations for follow up therapy are one component of a multi-disciplinary discharge planning process, led by the attending physician.  Recommendations may be updated based on patient status, additional functional criteria and insurance authorization.  Follow Up Recommendations  Can patient physically be transported by private vehicle: No    Assistance Recommended at Discharge Frequent or constant Supervision/Assistance  Patient can return home with  the following Assist for transportation;Two people to help with walking and/or transfers;A lot of help with bathing/dressing/bathroom         Precautions / Restrictions Precautions Precautions: Back;Fall Required Braces or Orthoses: Spinal Brace Spinal Brace: Thoracolumbosacral orthotic;Applied in sitting position Spinal Brace Comments: for OOB Restrictions Weight Bearing Restrictions: No     Mobility  Bed Mobility Overal bed mobility: Needs Assistance Bed Mobility: Rolling, Supine to Sit, Sit to Supine Rolling: Total assist Sidelying to sit: Max assist     Sit to sidelying: Total assist, +2 for physical assistance General bed mobility comments: max-total for log roll technique to exit bed; max mutlimodal cues required for hand placement, sequencing, LE placement, and spinal precautions.    Transfers Overall transfer level: Needs assistance Equipment used: Rolling walker (2 wheels) Transfers: Sit to/from Stand Sit to Stand: Mod assist           General transfer comment: mod assist for power to stand from EOB x2 with RW; multimodal cues for safety, sequencing, and hand placement    Ambulation/Gait Ambulation/Gait assistance: Min assist Gait Distance (Feet): 2 Feet           General Gait Details: min assist to take 4-5 lateral steps from EOB>recliner with RW. Demonstrates slowed cadence, decreased step length/foot clearance bil, and decreased RW proximity. Increased cueing for RW proximity/negotiation.      Balance   Sitting-balance support: Single extremity supported, Bilateral upper extremity supported, Feet supported Sitting balance-Leahy Scale: Fair Sitting balance - Comments: requires at least UUE support while seated EOB. total assist to don TLSO   Standing balance support: Bilateral upper extremity supported, During functional activity, Reliant on assistive device for balance Standing  balance-Leahy Scale: Fair Standing balance comment: with BUE support  on RW                            Cognition Arousal/Alertness: Lethargic, Suspect due to medications   Overall Cognitive Status: Impaired/Different from baseline                                 General Comments: Pt slightly more awake upon this session than in prior.        Exercises Other Exercises Other Exercises: Pt educated re: PT role/POC, DC recommendations, relaxing for pain management, log roll technique, spinal precautions, TLSO management/wear time, benefits of OOB mobility. Other Exercises: Participates in: log roll technique, transfers, and minimal gait. Increased lethargy after session.    General Comments General comments (skin integrity, edema, etc.): total assist to don TLSO while seated EOB. able to tighten cords of brace, with set up assist.      Pertinent Vitals/Pain Pain Assessment Pain Assessment: Faces Faces Pain Scale: Hurts even more     PT Goals (current goals can now be found in the care plan section) Acute Rehab PT Goals Patient Stated Goal: regain strength and independent mobility PT Goal Formulation: With family Time For Goal Achievement: 04/06/23 Potential to Achieve Goals: Poor Progress towards PT goals: Progressing toward goals    Frequency    Min 4X/week      PT Plan Current plan remains appropriate       AM-PAC PT "6 Clicks" Mobility   Outcome Measure  Help needed turning from your back to your side while in a flat bed without using bedrails?: Total Help needed moving from lying on your back to sitting on the side of a flat bed without using bedrails?: A Lot Help needed moving to and from a bed to a chair (including a wheelchair)?: A Little Help needed standing up from a chair using your arms (e.g., wheelchair or bedside chair)?: A Lot Help needed to walk in hospital room?: A Little Help needed climbing 3-5 steps with a railing? : A Lot 6 Click Score: 13    End of Session Equipment Utilized During  Treatment: Gait belt;Back brace Activity Tolerance: Patient limited by lethargy;Patient limited by pain Patient left: in chair;with call bell/phone within reach;with chair alarm set Nurse Communication: Mobility status PT Visit Diagnosis: Difficulty in walking, not elsewhere classified (R26.2);Other abnormalities of gait and mobility (R26.89);Muscle weakness (generalized) (M62.81);Other symptoms and signs involving the nervous system (R29.898)     Time: 9604-5409 PT Time Calculation (min) (ACUTE ONLY): 33 min  Charges:  $Therapeutic Activity: 23-37 mins                       Vira Blanco, PT, DPT 2:32 PM,03/25/23 Physical Therapist - [facility]

## 2023-03-26 DIAGNOSIS — S22081D Stable burst fracture of T11-T12 vertebra, subsequent encounter for fracture with routine healing: Secondary | ICD-10-CM | POA: Diagnosis not present

## 2023-03-26 LAB — BASIC METABOLIC PANEL
Anion gap: 8 (ref 5–15)
BUN: 15 mg/dL (ref 8–23)
CO2: 19 mmol/L — ABNORMAL LOW (ref 22–32)
Calcium: 7.8 mg/dL — ABNORMAL LOW (ref 8.9–10.3)
Chloride: 110 mmol/L (ref 98–111)
Creatinine, Ser: 0.63 mg/dL (ref 0.44–1.00)
GFR, Estimated: >60 mL/min (ref >60)
Glucose, Bld: 77 mg/dL (ref 70–99)
Potassium: 3.7 mmol/L (ref 3.5–5.1)
Sodium: 137 mmol/L (ref 135–145)

## 2023-03-26 NOTE — Progress Notes (Signed)
Progress Note   Patient: Sheryl Michael ZOX:[id number] DOB: 04-03-1931 DOA: 03/17/2023     9 DOS: the patient was seen and examined on 03/26/2023      Subjective:  Patient seen and examined at bedside this morning Pain is much better today Family still deciding on facility of the choice Transition of care following   Brief Narrative / Hospital Course:  [name] is a 89 year old female history of atrial fibrillation, hypertension, hyperlipidemia, hypothyroid. She presents emergency department 03/17/23 from [facility] assisted living for chief concerns of back pain of unknown etiology. Question trauma as she was found on the floor. She does not think that she fell, she thought that she just laid down on the floor because her back was hurting her.  She denies any neurological symptoms currently other than pain. 05/31: MRI shows T11 and likely L1 fx are acute, and do not suggest obvious instability. Neurosurgery advised TLSO and T-spine upright xrays once in brace to evaluate alignment 06/01: repeat XR show increasing angulation, planning for surgery for spinal fusion Monday following Xarelto washout. Afib w/ intermittent RVR, IV dilt push ineffective, started dilt gtt and converted to NSR. Paused gtt , increased po dose dilt, no further events until...  06/02: Again in rapid Afib early AM, restarted dilt gtt + heparin gtt anticipating surgery.  06/03: T8-L2 posterior spinal fusion w/ T8-9 and L1-2 cement augmentation, for T11 and L1 fractures.  06/04: NSR off dilt gtt. Stable post-op, PT/OT to see.       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 Patient underwent  T8-L2 posterior spinal fusion w/ T8-9 and L1-2 cement augmentation, for T11 and L1 fractures.  By neurosurgeon on 03/21/2023 We will continue with PT OT As needed pain control Case management working on skilled nursing facility placement   Afib RVR-improved Chronic  paroxysmal Afib Xarelto at home - resumed DVT ppx here so restarted Lovenox, would avoid therapeutic anticoagulation for at least 14 days post-op per Dr Myer Haff unless life-threatening need for it  Continue Cardizem Continue metoprolol Rate is much controlled now   Malignant neoplasm of breast continue anastrozole 1 mg daily resumed   HLD (hyperlipidemia)-stable Continue atorvastatin 40 mg daily resumed   Hypothyroidism, acquired Continue levothyroxine 50 mcg daily resumed   Depression, Dementia-stable Continue escitalopram 10 mg daily and memantine 5 mg po daily resumed   Hypokalemia Patient with potassium 3.3 on 03/24/2023 Continue repletion and monitoring   DVT prophylaxis: lovenox (plan restart Xarelto on 06/17)   Pertinent IV fluids/nutrition: no continuous IV fluids    Central lines / invasive devices: none   Code Status: FULL CODE     TOC needs / Dispo plan: Skilled nursing facility   Barriers to discharge / significant pending items: Continued Medical care while awaiting skilled nursing facility bed     Family Communication: Son present at bedside       Physical Exam Constitutional:      General: She is not in acute distress.  Appearance: She is not toxic-appearing.  Cardiovascular:     Rate and Rhythm: Normal rate and regular rhythm.  Pulmonary:     Effort: Pulmonary effort is normal.  Musculoskeletal: Dressing at the back appears clean and dry in the midline with patient was turned this morning for examination Neurological:     Mental Status: She is alert.  Mental status is at baseline.  Psychiatric:        Mood and Affect: Mood normal.        Behavior: Behavior normal.        Data Reviewed: Labs reviewed by me  Patient is stable just awaiting placement         Vitals:   03/26/23 0330 03/26/23 0747 03/26/23 1247 03/26/23 1629  BP: (!) 116/32 (!) 142/39 (!) 134/42 (!) 122/41  Pulse: (!) 59 (!) 56 64 62  Resp: 15 16 16 16   Temp: 97.6 F (36.4 C) 97.8 F (36.6 C) 98.2 F (36.8 C) 98.1 F (36.7 C)  TempSrc:      SpO2: 98% 100% 98% 98%  Weight:      Height:         Author: Loyce Dys, MD 03/26/2023 5:07 PM  For on call review [email or web address].

## 2023-03-26 NOTE — Progress Notes (Signed)
Physical Therapy Treatment Patient Details Name: Sheryl Michael MRN: [id number] DOB: 13-May-1931 Today's Date: 03/26/2023   History of Present Illness Sheryl Michael is an 89yoF who comes to [facility] 5/31, found on floor by staff at [facility] IL[facility]. PMH: AF, HTN, HLD, hypoTSH. Thoracic MRI revealing of acute T9, T10 fractures, unstable T11 body fracture, acute T1 fracture. Pt taken to OR with neurosurgical Dr. Myer Haff, is now s/p T8-L2 PSF with T8-9 and L1-2 cement augmentation for T11 and L1 fractures.    PT Comments    "I haven't had therapy yet today."  She is assisted to edge of bed with mod/max a x 1 but she does put effort into assisting.  Steady EOB with walker support.  Max assist to don TLSO and pt stated her back felt better once on.  She is able to stand with mod a x 1 and is able to take several small marches in place before stepping to chair with min a x 1.  Remained in recliner with needs met.  Unable to progress gait further at this time.   Recommendations for follow up therapy are one component of a multi-disciplinary discharge planning process, led by the attending physician.  Recommendations may be updated based on patient status, additional functional criteria and insurance authorization.  Follow Up Recommendations       Assistance Recommended at Discharge Frequent or constant Supervision/Assistance  Patient can return home with the following Assist for transportation;A lot of help with bathing/dressing/bathroom;Two people to help with walking and/or transfers   Equipment Recommendations       Recommendations for Other Services       Precautions / Restrictions Precautions Precautions: Back;Fall Required Braces or Orthoses: Spinal Brace Spinal Brace: Thoracolumbosacral orthotic;Applied in sitting position Spinal Brace Comments: for OOB Restrictions Weight Bearing Restrictions: No     Mobility  Bed Mobility Overal bed mobility: Needs Assistance Bed Mobility: Supine  to Sit     Supine to sit: Mod assist, Max assist          Transfers Overall transfer level: Needs assistance Equipment used: Rolling walker (2 wheels) Transfers: Sit to/from Stand Sit to Stand: Mod assist                Ambulation/Gait Ambulation/Gait assistance: Min assist Gait Distance (Feet): 3 Feet Assistive device: Rolling walker (2 wheels) Gait Pattern/deviations: Step-to pattern Gait velocity: decreased     General Gait Details: able to march in place and take a few sidesteps to recliner at bedside.   Stairs             Wheelchair Mobility    Modified Rankin (Stroke Patients Only)       Balance Overall balance assessment: Needs assistance Sitting-balance support: Single extremity supported, Bilateral upper extremity supported, Feet supported Sitting balance-Leahy Scale: Fair Sitting balance - Comments: requires at least UUE support while seated EOB. total assist to don TLSO   Standing balance support: Bilateral upper extremity supported, During functional activity, Reliant on assistive device for balance Standing balance-Leahy Scale: Fair Standing balance comment: with BUE support on RW, and hands on assist for balance                            Cognition Arousal/Alertness: Awake/alert Behavior During Therapy: WFL for tasks assessed/performed Overall Cognitive Status: Within Functional Limits for tasks assessed  General Comments: awake, alert and appropriate during session        Exercises      General Comments        Pertinent Vitals/Pain Pain Assessment Pain Assessment: Faces Faces Pain Scale: Hurts even more Pain Location: back Pain Descriptors / Indicators: Grimacing, Guarding, Aching Pain Intervention(s): Limited activity within patient's tolerance, Monitored during session    Home Living                          Prior Function            PT Goals  (current goals can now be found in the care plan section) Progress towards PT goals: Progressing toward goals    Frequency    Min 4X/week      PT Plan Current plan remains appropriate    Co-evaluation              AM-PAC PT "6 Clicks" Mobility   Outcome Measure  Help needed turning from your back to your side while in a flat bed without using bedrails?: A Lot Help needed moving from lying on your back to sitting on the side of a flat bed without using bedrails?: A Lot Help needed moving to and from a bed to a chair (including a wheelchair)?: A Little Help needed standing up from a chair using your arms (e.g., wheelchair or bedside chair)?: A Lot Help needed to walk in hospital room?: A Lot Help needed climbing 3-5 steps with a railing? : Total 6 Click Score: 12    End of Session Equipment Utilized During Treatment: Gait belt;Back brace Activity Tolerance: Patient tolerated treatment well Patient left: in chair;with call bell/phone within reach;with chair alarm set Nurse Communication: Mobility status PT Visit Diagnosis: Difficulty in walking, not elsewhere classified (R26.2);Other abnormalities of gait and mobility (R26.89);Muscle weakness (generalized) (M62.81);Other symptoms and signs involving the nervous system (Z61.096)     Time: 1343-1400 PT Time Calculation (min) (ACUTE ONLY): 17 min  Charges:  $Therapeutic Activity: 8-22 mins                   Danielle Dess, PTA 03/26/23, 2:40 PM

## 2023-03-27 DIAGNOSIS — S22081D Stable burst fracture of T11-T12 vertebra, subsequent encounter for fracture with routine healing: Secondary | ICD-10-CM | POA: Diagnosis not present

## 2023-03-27 NOTE — Progress Notes (Signed)
Progress Note   Patient: Sheryl Michael WUJ:[id number] DOB: 1930/12/12 DOA: 03/17/2023     10 DOS: the patient was seen and examined on 03/27/2023     Subjective:  I saw patient today she was sitting at the edge of the bed Denied acute overnight event Currently awaiting placement   Brief Narrative / Hospital Course:  [name] is a 89 year old female history of atrial fibrillation, hypertension, hyperlipidemia, hypothyroid. She presents emergency department 03/17/23 from [facility] assisted living for chief concerns of back pain of unknown etiology. Question trauma as she was found on the floor. She does not think that she fell, she thought that she just laid down on the floor because her back was hurting her.  She denies any neurological symptoms currently other than pain. 05/31: MRI shows T11 and likely L1 fx are acute, and do not suggest obvious instability. Neurosurgery advised TLSO and T-spine upright xrays once in brace to evaluate alignment 06/01: repeat XR show increasing angulation, planning for surgery for spinal fusion Monday following Xarelto washout. Afib w/ intermittent RVR, IV dilt push ineffective, started dilt gtt and converted to NSR. Paused gtt , increased po dose dilt, no further events until...  06/02: Again in rapid Afib early AM, restarted dilt gtt + heparin gtt anticipating surgery.  06/03: T8-L2 posterior spinal fusion w/ T8-9 and L1-2 cement augmentation, for T11 and L1 fractures.  06/04: NSR off dilt gtt. Stable post-op, PT/OT to see.       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 Patient underwent  T8-L2 posterior spinal fusion w/ T8-9 and L1-2 cement augmentation, for T11 and L1 fractures.  By neurosurgeon on 03/21/2023 We will continue with PT OT As needed pain control Case management working on skilled nursing facility placement   Afib RVR-improved Chronic  paroxysmal Afib Xarelto at home - resumed DVT ppx here so restarted Lovenox, would avoid therapeutic anticoagulation for at least 14 days post-op per Dr Myer Haff unless life-threatening need for it  Continue Cardizem Continue metoprolol Rate is much controlled now   Malignant neoplasm of breast continue anastrozole 1 mg daily resumed   HLD (hyperlipidemia)-stable Continue atorvastatin 40 mg daily resumed   Hypothyroidism, acquired Continue levothyroxine 50 mcg daily resumed   Depression, Dementia-stable Continue escitalopram 10 mg daily and memantine 5 mg po daily resumed   Hypokalemia Patient with potassium 3.3 on 03/24/2023 Continue repletion and monitoring   DVT prophylaxis: lovenox (plan restart Xarelto on 06/17)   Pertinent IV fluids/nutrition: no continuous IV fluids    Central lines / invasive devices: none   Code Status: FULL CODE     TOC needs / Dispo plan: Skilled nursing facility   Barriers to discharge / significant pending items: Continued Medical care while awaiting skilled nursing facility bed     Family Communication: Son present at bedside       Physical Exam Constitutional:      General: She is not in acute distress.    Appearance: She is not toxic-appearing.  Cardiovascular:     Rate and Rhythm: Normal rate and regular rhythm.  Pulmonary:     Effort: Pulmonary effort is normal.  Musculoskeletal: Dressing at the back appears clean and dry in the midline with patient was turned this morning for examination Neurological:     Mental Status: She is alert. Mental status is at  baseline.  Psychiatric:        Mood and Affect: Mood normal.        Behavior: Behavior normal.        Data Reviewed: No labs available today for review   Patient is stable just awaiting placement     Vitals:   03/27/23 0821 03/27/23 0857 03/27/23 1152 03/27/23 1641  BP: (!) 125/95 118/63 (!) 111/96 134/72  Pulse: (!) 124 (!) 56 68 (!) 54  Resp:  16 13 (!) 21  Temp:   98.2 F (36.8 C)   TempSrc:   Oral   SpO2:  97% 98% 99%  Weight:      Height:        Author: Loyce Dys, MD 03/27/2023 5:17 PM  For on call review [email or web address].

## 2023-03-27 NOTE — Progress Notes (Addendum)
Physical Therapy Treatment Patient Details Name: Sheryl Michael MRN: [id number] DOB: 11/30/1930 Today's Date: 03/27/2023   History of Present Illness Sheryl Michael is an 89yoF who comes to [facility] 5/31, found on floor by staff at [facility]. PMH: AF, HTN, HLD, hypoTSH. Thoracic MRI revealing of acute T9, T10 fractures, unstable T11 body fracture, acute T1 fracture. Pt taken to OR with neurosurgical Dr. Myer Haff, is now s/p T8-L2 PSF with T8-9 and L1-2 cement augmentation for T11 and L1 fractures.    PT Comments    Some overlap with OT during session to allow for improved outcomes and increased activity.  She is able to move her legs better to EOB today with overall less assist today to transition to sitting.  Once sitting she is able to sit unsupported and OT arrives.  Pt is generally sweaty and in need of a bath.  OT interventions for sitting EOB for bathing.  See OT note for details.  Once care is completed and full assist to don TLSO she is able to stand with min a x 2 then min a x 1 to take small steps to chair.  She remains standing once turned and is able to march in place x 12 reps bilaterally and stand statically before fatigue.  She does remain up in chair with needs met.    Overall improved tolerance to mobility today and more participatory in session. Functional gait remains limited.  She will benefit from continued therapies at discharge.   Recommendations for follow up therapy are one component of a multi-disciplinary discharge planning process, led by the attending physician.  Recommendations may be updated based on patient status, additional functional criteria and insurance authorization.  Follow Up Recommendations       Assistance Recommended at Discharge Frequent or constant Supervision/Assistance  Patient can return home with the following Assist for transportation;A lot of help with bathing/dressing/bathroom;Two people to help with walking and/or transfers   Equipment  Recommendations       Recommendations for Other Services       Precautions / Restrictions Precautions Precautions: Back;Fall Required Braces or Orthoses: Spinal Brace Spinal Brace: Thoracolumbosacral orthotic;Applied in sitting position Spinal Brace Comments: for OOB Restrictions Weight Bearing Restrictions: No     Mobility  Bed Mobility Overal bed mobility: Needs Assistance Bed Mobility: Supine to Sit     Supine to sit: Mod assist     General bed mobility comments: does better mobing her legs to EOB today and overall less assist to transition to sitting. Patient Response: Cooperative  Transfers Overall transfer level: Needs assistance Equipment used: Rolling walker (2 wheels)   Sit to Stand: Min assist, +2 physical assistance                Ambulation/Gait Ambulation/Gait assistance: Min assist Gait Distance (Feet): 3 Feet Assistive device: Rolling walker (2 wheels) Gait Pattern/deviations: Step-to pattern Gait velocity: decreased     General Gait Details: able to march in place and take a few sidesteps to recliner at bedside.  increases static tolerace and marching reps today   Stairs             Wheelchair Mobility    Modified Rankin (Stroke Patients Only)       Balance Overall balance assessment: Needs assistance Sitting-balance support: Single extremity supported, Bilateral upper extremity supported, Feet supported Sitting balance-Leahy Scale: Fair Sitting balance - Comments: able to sit unsupported today and participate in bathing wiht OT  Cognition Arousal/Alertness: Awake/alert Behavior During Therapy: WFL for tasks assessed/performed Overall Cognitive Status: Within Functional Limits for tasks assessed                                 General Comments: awake, alert and appropriate during session        Exercises Other Exercises Other Exercises: marching in place  and static standing.    General Comments        Pertinent Vitals/Pain Pain Assessment Pain Assessment: Faces Faces Pain Scale: Hurts little more Pain Location: back  "Oh my goodness" repeatedly which per family is her phrase Pain Descriptors / Indicators: Grimacing, Guarding, Aching Pain Intervention(s): Limited activity within patient's tolerance, Monitored during session, Repositioned, Other (comment) (TLSO applied which does improve comfort in sitting)    Home Living                          Prior Function            PT Goals (current goals can now be found in the care plan section) Progress towards PT goals: Progressing toward goals    Frequency    Min 4X/week      PT Plan Current plan remains appropriate    Co-evaluation              AM-PAC PT "6 Clicks" Mobility   Outcome Measure  Help needed turning from your back to your side while in a flat bed without using bedrails?: A Lot Help needed moving from lying on your back to sitting on the side of a flat bed without using bedrails?: A Lot Help needed moving to and from a bed to a chair (including a wheelchair)?: A Little Help needed standing up from a chair using your arms (e.g., wheelchair or bedside chair)?: A Lot Help needed to walk in hospital room?: A Lot Help needed climbing 3-5 steps with a railing? : Total 6 Click Score: 12    End of Session Equipment Utilized During Treatment: Back brace Activity Tolerance: Patient tolerated treatment well Patient left: in chair;with call bell/phone within reach;with chair alarm set Nurse Communication: Mobility status PT Visit Diagnosis: Difficulty in walking, not elsewhere classified (R26.2);Other abnormalities of gait and mobility (R26.89);Muscle weakness (generalized) (M62.81);Other symptoms and signs involving the nervous system (U98.119)     Time: 1478-2956 PT Time Calculation (min) (ACUTE ONLY): 8 min  Charges:  $Therapeutic Activity: 8-22  mins                   Danielle Dess, PTA 03/27/23, 10:41 AM

## 2023-03-27 NOTE — Progress Notes (Signed)
Occupational Therapy Treatment Patient Details Name: Sheryl Michael MRN: [id number] DOB: [birth year] Today's Date: 03/27/2023   History of present illness Sheryl Michael is an 89yoF who comes to [facility] 5/31, found on floor by staff at [facility]. PMH: AF, HTN, HLD, hypoTSH. Thoracic MRI revealing of acute T9, T10 fractures, unstable T11 body fracture, acute T1 fracture. Pt taken to OR with neurosurgical Dr. Myer Haff, is now s/p T8-L2 PSF with T8-9 and L1-2 cement augmentation for T11 and L1 fractures.   OT comments  Pt seen for OT tx this date with brief over lap with PT for mobility. Pt completed seated EOB bathing, grooming, and UB dressing requiring MAX A for LB bathing, setup and supv for grooming, and MIN A for UB dressing. Pt tolerated EOB well, improved comfort once TLSO was applied in sitting. MAX A for TLSO mgt. STS with MIN A +2 and MIN A +1 for getting to recliner with PT. Pt progressing towards goals. Continues to benefit from skilled OT services. Will provide handout.    Recommendations for follow up therapy are one component of a multi-disciplinary discharge planning process, led by the attending physician.  Recommendations may be updated based on patient status, additional functional criteria and insurance authorization.    Assistance Recommended at Discharge Frequent or constant Supervision/Assistance  Patient can return home with the following  Two people to help with walking and/or transfers;Assistance with cooking/housework;Help with stairs or ramp for entrance;Assist for transportation;Direct supervision/assist for medications management;Direct supervision/assist for financial management;A lot of help with bathing/dressing/bathroom   Equipment Recommendations  BSC/3in1    Recommendations for Other Services      Precautions / Restrictions Precautions Precautions: Back;Fall Precaution Booklet Issued: Yes (comment) Required Braces or Orthoses: Spinal Brace Spinal Brace:  Thoracolumbosacral orthotic;Applied in sitting position Spinal Brace Comments: for OOB Restrictions Weight Bearing Restrictions: No       Mobility Bed Mobility Overal bed mobility: Needs Assistance Bed Mobility: Supine to Sit     Supine to sit: Mod assist     General bed mobility comments: MOD A with PT    Transfers Overall transfer level: Needs assistance Equipment used: Rolling walker (2 wheels) Transfers: Sit to/from Stand Sit to Stand: Min assist, +2 physical assistance                 Balance Overall balance assessment: Needs assistance Sitting-balance support: Single extremity supported, Bilateral upper extremity supported, Feet supported Sitting balance-Leahy Scale: Fair Sitting balance - Comments: tolerated EOB bathing   Standing balance support: Bilateral upper extremity supported, During functional activity, Reliant on assistive device for balance Standing balance-Leahy Scale: Fair Standing balance comment: with BUE support on RW, and hands on assist for balance                           ADL either performed or assessed with clinical judgement   ADL Overall ADL's : Needs assistance/impaired     Grooming: Sitting;Wash/dry hands;Wash/dry face;Set up;Supervision/safety Grooming Details (indicate cue type and reason): seated EOB Upper Body Bathing: Sitting;Minimal assistance Upper Body Bathing Details (indicate cue type and reason): MIN A for washing back, VC to continue/initiate, seated EOB Lower Body Bathing: Sitting/lateral leans;Maximal assistance   Upper Body Dressing : Sitting;Minimal assistance Upper Body Dressing Details (indicate cue type and reason): gown                        Extremity/Trunk Assessment  Vision       Perception     Praxis      Cognition Arousal/Alertness: Awake/alert Behavior During Therapy: WFL for tasks assessed/performed Overall Cognitive Status: Within Functional Limits for  tasks assessed                                          Exercises Other Exercises Other Exercises: Pt participated in seated EOB unsupported bathing, grooming, and UB dressing tasks. No overt LOB, improved comfort once TLSO was applied after ADL.    Shoulder Instructions       General Comments      Pertinent Vitals/ Pain       Pain Assessment Pain Assessment: Faces Faces Pain Scale: Hurts little more Pain Location: back  "Oh my goodness" repeatedly which per family is her phrase Pain Descriptors / Indicators: Grimacing, Guarding, Aching Pain Intervention(s): Limited activity within patient's tolerance, Monitored during session, Premedicated before session, Repositioned  Home Living                                          Prior Functioning/Environment              Frequency  Min 3X/week        Progress Toward Goals  OT Goals(current goals can now be found in the care plan section)  Progress towards OT goals: Progressing toward goals  Acute Rehab OT Goals Patient Stated Goal: go back home and have less pain OT Goal Formulation: With patient/family Time For Goal Achievement: 04/04/23 Potential to Achieve Goals: Good  Plan Discharge plan remains appropriate;Frequency remains appropriate    Co-evaluation                 AM-PAC OT "6 Clicks" Daily Activity     Outcome Measure   Help from another person eating meals?: A Little Help from another person taking care of personal grooming?: A Little Help from another person toileting, which includes using toliet, bedpan, or urinal?: A Lot Help from another person bathing (including washing, rinsing, drying)?: A Lot Help from another person to put on and taking off regular upper body clothing?: A Little Help from another person to put on and taking off regular lower body clothing?: A Lot 6 Click Score: 15    End of Session Equipment Utilized During Treatment: Rolling  walker (2 wheels);Back brace  OT Visit Diagnosis: Other abnormalities of gait and mobility (R26.89);Muscle weakness (generalized) (M62.81);History of falling (Z91.81);Pain Pain - part of body:  (back)   Activity Tolerance Patient tolerated treatment well   Patient Left in chair;with call bell/phone within reach;with chair alarm set   Nurse Communication Mobility status        Time: 1610-9604 OT Time Calculation (min): 10 min  Charges: OT General Charges $OT Visit: 1 Visit OT Treatments $Self Care/Home Management : 8-22 mins  Arman Filter., MPH, MS, OTR/L ascom [phone] 03/27/23, 11:05 AM

## 2023-03-27 NOTE — Care Management Important Message (Signed)
Important Message  Patient Details  Name: RHEANNON CERNEY MRN: [id number] Date of Birth: [birth year]   Medicare Important Message Given:  Yes     [name] 03/27/2023, 12:02 PM

## 2023-03-27 NOTE — TOC Progression Note (Signed)
Transition of Care [facility]) - Progression Note    Patient Details  Name: Sheryl Michael MRN: [id number] Date of Birth: Mar 13, 1931  Transition of Care [facility]) CM/SW Contact  Truddie Hidden, RN Phone Number: 03/27/2023, 4:32 PM  Clinical Narrative:    Spoke with patient's daughter, [name]. Gave bed offers for [facility], [facility], [facility], [facility] and [facility]. [facility] a decision would soon need to be made as patient is nearing discharge readiness. [name] would like [facility] and stated they may have a bed available later this week. She nad her siblings will tour the facilities listed and make a choice soon.         Expected Discharge Plan and Services                                               Social Determinants of Health (SDOH) Interventions SDOH Screenings   Food Insecurity: No Food Insecurity (03/26/2023)  Housing: Low Risk  (03/26/2023)  Transportation Needs: No Transportation Needs (03/26/2023)  Utilities: Not At Risk (03/26/2023)  Tobacco Use: Low Risk  (03/20/2023)    Readmission Risk Interventions     No data to display

## 2023-03-28 DIAGNOSIS — S22081D Stable burst fracture of T11-T12 vertebra, subsequent encounter for fracture with routine healing: Secondary | ICD-10-CM | POA: Diagnosis not present

## 2023-03-28 MED ORDER — ONDANSETRON HCL 4 MG PO TABS: 4.0000 mg | ORAL_TABLET | Freq: Four times a day (QID) | ORAL | Status: DC | PRN

## 2023-03-28 MED ORDER — ORAL CARE MOUTH RINSE: 15.0000 mL | OROMUCOSAL | Status: DC | PRN

## 2023-03-28 MED ORDER — ONDANSETRON HCL 4 MG/2ML IJ SOLN
4.0000 mg | Freq: Four times a day (QID) | INTRAMUSCULAR | Status: DC | PRN
  Filled 2023-03-28: qty 2

## 2023-03-28 NOTE — Plan of Care (Signed)

## 2023-03-28 NOTE — TOC Progression Note (Addendum)
Transition of Care [facility]) - Progression Note    Patient Details  Name: Sheryl Michael MRN: [id number] Date of Birth: 08-24-1931  [facility]) CM/SW Contact  Truddie Hidden, RN Phone Number: 03/28/2023, 10:14 AM  Clinical Narrative:    Spoke with patient's daughter's [name] and [name]. They have chosen [facility]. They were advised auth would be required for admission. They verbalized her understanding.    Vesta Mixer started        Expected Discharge Plan and Services                                               Social Determinants of Health (SDOH) Interventions SDOH Screenings   Food Insecurity: No Food Insecurity (03/26/2023)  Housing: Low Risk  (03/26/2023)  Transportation Needs: No Transportation Needs (03/26/2023)  Utilities: Not At Risk (03/26/2023)  Tobacco Use: Low Risk  (03/20/2023)    Readmission Risk Interventions     No data to display

## 2023-03-28 NOTE — Plan of Care (Signed)
  Problem: Activity: Goal: Risk for activity intolerance will decrease Outcome: Not Progressing   Problem: Pain Managment: Goal: General experience of comfort will improve Outcome: Not Progressing   

## 2023-03-28 NOTE — Progress Notes (Signed)
Progress Note   Patient: Sheryl Michael:[id number] DOB: 02-03-1931 DOA: 03/17/2023     11 DOS: the patient was seen and examined on 03/28/2023     Subjective:  Patient seen sitting in a chair today Denied acute overnight event Currently awaiting placement   Brief Narrative / Hospital Course:  [name] is a 89 year old female history of atrial fibrillation, hypertension, hyperlipidemia, hypothyroid. She presents emergency department 03/17/23 from [facility] assisted living for chief concerns of back pain of unknown etiology. Question trauma as she was found on the floor. She does not think that she fell, she thought that she just laid down on the floor because her back was hurting her.  She denies any neurological symptoms currently other than pain. 05/31: MRI shows T11 and likely L1 fx are acute, and do not suggest obvious instability. Neurosurgery advised TLSO and T-spine upright xrays once in brace to evaluate alignment 06/01: repeat XR show increasing angulation, planning for surgery for spinal fusion Monday following Xarelto washout. Afib w/ intermittent RVR, IV dilt push ineffective, started dilt gtt and converted to NSR. Paused gtt , increased po dose dilt, no further events until...  06/02: Again in rapid Afib early AM, restarted dilt gtt + heparin gtt anticipating surgery.  06/03: T8-L2 posterior spinal fusion w/ T8-9 and L1-2 cement augmentation, for T11 and L1 fractures.  06/04: NSR off dilt gtt. Stable post-op, PT/OT to see.       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 Patient underwent  T8-L2 posterior spinal fusion w/ T8-9 and L1-2 cement augmentation, for T11 and L1 fractures.  By neurosurgeon on 03/21/2023 We will continue with PT OT As needed pain control Case management working on skilled nursing facility placement   Afib RVR-improved Chronic  paroxysmal Afib Xarelto at home - resumed DVT ppx here so restarted Lovenox, would avoid therapeutic anticoagulation for at least 14 days post-op per Dr Myer Haff unless life-threatening need for it  Continue Cardizem Continue metoprolol Rate is much controlled now   Malignant neoplasm of breast continue anastrozole 1 mg daily resumed   HLD (hyperlipidemia)-stable Continue atorvastatin 40 mg daily resumed   Hypothyroidism, acquired Continue levothyroxine 50 mcg daily resumed   Depression, Dementia-stable Continue escitalopram 10 mg daily and memantine 5 mg po daily resumed   Hypokalemia Patient with potassium 3.3 on 03/24/2023 Continue repletion and monitoring   DVT prophylaxis: lovenox (plan restart Xarelto on 06/17)      Central lines / invasive devices: none   Code Status: FULL CODE     TOC needs / Dispo plan: Skilled nursing facility   Barriers to discharge / significant pending items: Continued Medical care while awaiting skilled nursing facility bed     Family Communication: Son present at bedside     Physical Exam Constitutional:      General: She is not in acute distress.    Appearance: She is not toxic-appearing.  Cardiovascular:     Rate and Rhythm: Normal rate and regular rhythm.  Pulmonary:     Effort: Pulmonary effort is normal.  Musculoskeletal: Dressing at the back appears clean and dry in the midline with patient was turned this morning for examination Neurological:     Mental Status: She is alert. Mental status is at baseline.  Psychiatric:        Mood and Affect: Mood normal.        Behavior: Behavior  normal.        Data Reviewed: No labs available today for review   Patient is stable just awaiting placement     Vitals:   03/28/23 0504 03/28/23 0700 03/28/23 1300 03/28/23 1700  BP:  (!) 126/56 (!) 133/50 (!) 165/56  Pulse:  (!) 16  68  Resp:  17 17 17   Temp: 97.7 F (36.5 C) 98.3 F (36.8 C) 98.1 F (36.7 C) 98 F (36.7 C)  TempSrc:  Oral Oral Oral  SpO2:      Weight:      Height:       Author: Loyce Dys, MD 03/28/2023 5:57 PM  For on call review [email or web address].

## 2023-03-28 NOTE — Progress Notes (Signed)
Physical Therapy Treatment Patient Details Name: Sheryl Michael MRN: [id number] DOB: 02-Mar-1931 Today's Date: 03/28/2023   History of Present Illness Sheryl Michael is an 89yoF who comes to [facility] 5/31, found on floor by staff at [facility] ILF. PMH: AF, HTN, HLD, hypoTSH. Thoracic MRI revealing of acute T9, T10 fractures, unstable T11 body fracture, acute T1 fracture. Pt taken to OR with neurosurgical Dr. Myer Haff, is now s/p T8-L2 PSF with T8-9 and L1-2 cement augmentation for T11 and L1 fractures.    PT Comments    Pt in bed with TLSO on and stating generally uncomfortable.  She stated she has been out of bed today for +1 hours.  She is assisted to sitting with min a x 1 and is able to stand to RW with min a x 1 and sidestep up in bed for better positioning.  Overall steps are improved today and she is able to remain standing for marching in place.  She does fatigue and is assisted to sitting and TLSO is removed and she returns to supine with mod a x 1 where she opts to lay on left side.  Pillows used to position to comfort.    Pt continued with overall improvement in mobility daily but remains far from baseline.  She would benefit from continued therapies at discharge.   Recommendations for follow up therapy are one component of a multi-disciplinary discharge planning process, led by the attending physician.  Recommendations may be updated based on patient status, additional functional criteria and insurance authorization.  Follow Up Recommendations       Assistance Recommended at Discharge Frequent or constant Supervision/Assistance  Patient can return home with the following Assist for transportation;A little help with walking and/or transfers;A little help with bathing/dressing/bathroom   Equipment Recommendations       Recommendations for Other Services       Precautions / Restrictions Precautions Precautions: Back;Fall Precaution Booklet Issued: Yes (comment) Required Braces  or Orthoses: Spinal Brace Spinal Brace: Thoracolumbosacral orthotic;Applied in sitting position Spinal Brace Comments: for OOB Restrictions Weight Bearing Restrictions: No     Mobility  Bed Mobility Overal bed mobility: Needs Assistance Bed Mobility: Supine to Sit, Sit to Supine     Supine to sit: Min assist Sit to supine: Min assist        Transfers Overall transfer level: Needs assistance Equipment used: Rolling walker (2 wheels) Transfers: Sit to/from Stand Sit to Stand: Min assist                Ambulation/Gait Ambulation/Gait assistance: Editor, commissioning (Feet): 4 Feet Assistive device: Rolling walker (2 wheels) Gait Pattern/deviations: Step-to pattern Gait velocity: decreased     General Gait Details: able to sidesteps along bed with RW and min assist.   Stairs             Wheelchair Mobility    Modified Rankin (Stroke Patients Only)       Balance Overall balance assessment: Needs assistance Sitting-balance support: Single extremity supported, Bilateral upper extremity supported, Feet supported Sitting balance-Leahy Scale: Fair     Standing balance support: Bilateral upper extremity supported, During functional activity, Reliant on assistive device for balance Standing balance-Leahy Scale: Fair Standing balance comment: with BUE support on RW, and hands on assist for balance                            Cognition Arousal/Alertness: Awake/alert Behavior During Therapy: [facility]  for tasks assessed/performed Overall Cognitive Status: Within Functional Limits for tasks assessed                                          Exercises Other Exercises Other Exercises: marching in place and static standing.    General Comments        Pertinent Vitals/Pain Pain Assessment Pain Assessment: Faces Faces Pain Scale: Hurts little more Pain Location: generally uncomfortable in bed with TLSO on - assisted in removal  after session Pain Descriptors / Indicators: Grimacing, Guarding, Aching    Home Living                          Prior Function            PT Goals (current goals can now be found in the care plan section) Progress towards PT goals: Progressing toward goals    Frequency    Min 4X/week      PT Plan Current plan remains appropriate    Co-evaluation              AM-PAC PT "6 Clicks" Mobility   Outcome Measure  Help needed turning from your back to your side while in a flat bed without using bedrails?: A Lot Help needed moving from lying on your back to sitting on the side of a flat bed without using bedrails?: A Lot Help needed moving to and from a bed to a chair (including a wheelchair)?: A Little Help needed standing up from a chair using your arms (e.g., wheelchair or bedside chair)?: A Little Help needed to walk in hospital room?: A Little Help needed climbing 3-5 steps with a railing? : Total 6 Click Score: 14    End of Session Equipment Utilized During Treatment: Gait belt;Back brace Activity Tolerance: Patient tolerated treatment well Patient left: in bed;with call bell/phone within reach;with bed alarm set Nurse Communication: Mobility status PT Visit Diagnosis: Difficulty in walking, not elsewhere classified (R26.2);Other abnormalities of gait and mobility (R26.89);Muscle weakness (generalized) (M62.81);Other symptoms and signs involving the nervous system (R29.898)     Time: 1610-9604 PT Time Calculation (min) (ACUTE ONLY): 9 min  Charges:  $Therapeutic Activity: 8-22 mins                   Danielle Dess, PTA 03/28/23, 2:55 PM

## 2023-03-29 ENCOUNTER — Inpatient Hospital Stay: Payer: Medicare Other

## 2023-03-29 DIAGNOSIS — S22008A Other fracture of unspecified thoracic vertebra, initial encounter for closed fracture: Secondary | ICD-10-CM | POA: Diagnosis not present

## 2023-03-29 DIAGNOSIS — S22089A Unspecified fracture of T11-T12 vertebra, initial encounter for closed fracture: Secondary | ICD-10-CM | POA: Diagnosis not present

## 2023-03-29 LAB — CULTURE, BLOOD (ROUTINE X 2)

## 2023-03-29 LAB — URINALYSIS, COMPLETE (UACMP) WITH MICROSCOPIC
Bilirubin Urine: NEGATIVE
Glucose, UA: NEGATIVE mg/dL
Ketones, ur: NEGATIVE mg/dL
Leukocytes,Ua: NEGATIVE
Nitrite: NEGATIVE
Protein, ur: NEGATIVE mg/dL
Specific Gravity, Urine: 1.005 (ref 1.005–1.030)
pH: 8 (ref 5.0–8.0)

## 2023-03-29 LAB — CBC WITH DIFFERENTIAL/PLATELET
Abs Immature Granulocytes: 0.12 10*3/uL — ABNORMAL HIGH (ref 0.00–0.07)
Basophils Absolute: 0 10*3/uL (ref 0.0–0.1)
Basophils Relative: 0 %
Eosinophils Absolute: 0 10*3/uL (ref 0.0–0.5)
Eosinophils Relative: 0 %
HCT: 31.8 % — ABNORMAL LOW (ref 36.0–46.0)
Hemoglobin: 10.4 g/dL — ABNORMAL LOW (ref 12.0–15.0)
Immature Granulocytes: 2 %
Lymphocytes Relative: 11 %
Lymphs Abs: 0.8 10*3/uL (ref 0.7–4.0)
MCH: 31.2 pg (ref 26.0–34.0)
MCHC: 32.7 g/dL (ref 30.0–36.0)
MCV: 95.5 fL (ref 80.0–100.0)
Monocytes Absolute: 0.5 10*3/uL (ref 0.1–1.0)
Monocytes Relative: 7 %
Neutro Abs: 5.8 10*3/uL (ref 1.7–7.7)
Neutrophils Relative %: 80 %
Platelets: 246 10*3/uL (ref 150–400)
RBC: 3.33 MIL/uL — ABNORMAL LOW (ref 3.87–5.11)
RDW: 14.8 % (ref 11.5–15.5)
WBC: 7.3 10*3/uL (ref 4.0–10.5)
nRBC: 0 % (ref 0.0–0.2)

## 2023-03-29 LAB — APTT: aPTT: 45 seconds — ABNORMAL HIGH (ref 24–36)

## 2023-03-29 LAB — COMPREHENSIVE METABOLIC PANEL
ALT: 129 U/L — ABNORMAL HIGH (ref 0–44)
AST: 225 U/L — ABNORMAL HIGH (ref 15–41)
Albumin: 2.5 g/dL — ABNORMAL LOW (ref 3.5–5.0)
Alkaline Phosphatase: 117 U/L (ref 38–126)
Anion gap: 9 (ref 5–15)
BUN: 7 mg/dL — ABNORMAL LOW (ref 8–23)
CO2: 28 mmol/L (ref 22–32)
Calcium: 7.6 mg/dL — ABNORMAL LOW (ref 8.9–10.3)
Chloride: 103 mmol/L (ref 98–111)
Creatinine, Ser: 0.72 mg/dL (ref 0.44–1.00)
GFR, Estimated: >60 mL/min (ref >60)
Glucose, Bld: 97 mg/dL (ref 70–99)
Potassium: 3.2 mmol/L — ABNORMAL LOW (ref 3.5–5.1)
Sodium: 140 mmol/L (ref 135–145)
Total Bilirubin: 0.5 mg/dL (ref 0.3–1.2)
Total Protein: 6 g/dL — ABNORMAL LOW (ref 6.5–8.1)

## 2023-03-29 LAB — PROTIME-INR
INR: 1.1 (ref 0.8–1.2)
Prothrombin Time: 14 seconds (ref 11.4–15.2)

## 2023-03-29 LAB — RESPIRATORY PANEL BY PCR

## 2023-03-29 LAB — RESP PANEL BY RT-PCR (RSV, FLU A&B, COVID)  RVPGX2
Influenza A by PCR: NEGATIVE
Influenza B by PCR: NEGATIVE
Resp Syncytial Virus by PCR: NEGATIVE
SARS Coronavirus 2 by RT PCR: POSITIVE — AB

## 2023-03-29 LAB — HEPATITIS PANEL, ACUTE
HCV Ab: NONREACTIVE
Hep A IgM: NONREACTIVE
Hep B C IgM: NONREACTIVE
Hepatitis B Surface Ag: NONREACTIVE

## 2023-03-29 LAB — PROCALCITONIN: Procalcitonin: <0.1 ng/mL

## 2023-03-29 LAB — LACTIC ACID, PLASMA
Lactic Acid, Venous: 1.1 mmol/L (ref 0.5–1.9)
Lactic Acid, Venous: 1 mmol/L (ref 0.5–1.9)

## 2023-03-29 LAB — MAGNESIUM: Magnesium: 2.1 mg/dL (ref 1.7–2.4)

## 2023-03-29 MED ORDER — SODIUM CHLORIDE 0.9 % IV BOLUS (SEPSIS)
500.0000 mL | Freq: Once | INTRAVENOUS | Status: AC
  Administered 2023-03-29: 500 mL via INTRAVENOUS

## 2023-03-29 MED ORDER — METOPROLOL TARTRATE 25 MG PO TABS
25.0000 mg | ORAL_TABLET | Freq: Two times a day (BID) | ORAL | Status: DC
  Administered 2023-03-30 – 2023-04-02 (×6): 25 mg via ORAL
  Filled 2023-03-29 (×7): qty 1

## 2023-03-29 MED ORDER — METOPROLOL TARTRATE 25 MG PO TABS
12.5000 mg | ORAL_TABLET | Freq: Once | ORAL | Status: AC
  Administered 2023-03-29: 12.5 mg via ORAL
  Filled 2023-03-29: qty 1

## 2023-03-29 MED ORDER — POTASSIUM CHLORIDE CRYS ER 20 MEQ PO TBCR
40.0000 meq | EXTENDED_RELEASE_TABLET | ORAL | Status: AC
  Administered 2023-03-29 (×2): 40 meq via ORAL
  Filled 2023-03-29 (×2): qty 2

## 2023-03-29 MED ORDER — METOPROLOL TARTRATE 5 MG/5ML IV SOLN
5.0000 mg | INTRAVENOUS | Status: DC | PRN
  Filled 2023-03-29: qty 5

## 2023-03-29 NOTE — Progress Notes (Signed)
Progress Note   Patient: Sheryl Michael ZOX:[id number] DOB: [birth year] DOA: 03/17/2023     11 DOS: the patient was seen and examined on 03/28/2023     Brief Narrative / Hospital Course:  Ms. Vasiliki Medellin is a 89 year old female history of atrial fibrillation, hypertension, hyperlipidemia, hypothyroid. She presents emergency department 03/17/23 from [facility] assisted living for chief concerns of back pain of unknown etiology. Question trauma as she was found on the floor. She does not think that she fell, she thought that she just laid down on the floor because her back was hurting her.  She denies any neurological symptoms currently other than pain. 05/31: MRI shows T11 and likely L1 fx are acute, and do not suggest obvious instability. Neurosurgery advised TLSO and T-spine upright xrays once in brace to evaluate alignment 06/01: repeat XR show increasing angulation, planning for surgery for spinal fusion Monday following Xarelto washout. Afib w/ intermittent RVR, IV dilt push ineffective, started dilt gtt and converted to NSR. Paused gtt , increased po dose dilt, no further events until...  06/02: Again in rapid Afib early AM, restarted dilt gtt + heparin gtt anticipating surgery.  06/03: T8-L2 posterior spinal fusion w/ T8-9 and L1-2 cement augmentation, for T11 and L1 fractures.  06/04: NSR off dilt gtt. Stable post-op, PT/OT to see.    6/12 -- temp 100.1 early AM.  Tested + Covid.  Discharge to SNF delayed.  Recurrent A-fib with RVR, HR's 110-120's at rest.     Subjective / Interval Hx:  Patient awake sitting up with bed with daughters at bedside this AM.  She reports a cough but otherwise feeling okay.  Back remains sore but controlled fairly well.     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 Patient underwent  T8-L2 posterior spinal fusion w/ T8-9 and L1-2 cement augmentation, for T11 and L1 fractures with neurosurgery on 03/21/2023 --Follow up with Dr. Myer Haff --Continue with PT OT --As needed pain control --TOC following for placement   Afib RVR-improved >> recurrent in setting of +Covid infection Chronic  paroxysmal Afib --On Xarelto at home - held until at least 14 days post-op per neurosurgery (unless life threatening need for full anticoagulation). --On Lovenox for DVT ppx for now --Continue Cardizem --Continue metoprolol -- increase 12.5 >> 25 mg BID --PRN IV metoprolol for HR's sustained > 110 bpm --6/12 -- HR's elevated again 110-120's at rest on rounds this AM.  Due to Covid infection.  Covid-19 infection -- pt tested positive today 6/12 after elevated temp 100.1 and new cough reported.   --Airborne & contact precautions --Delays SNF placement for required isolation    Malignant neoplasm of breast --Continue anastrozole   HLD (hyperlipidemia)-stable --Continue atorvastatin   Hypothyroidism, acquired --Continue levothyroxine    Depression, Dementia-stable --Continue escitalopram, memantine 5 mg po daily resumed   Hypokalemia - recurrent K 3.3 was replaced on 03/24/2023, then normalized K 3.2 this AM - further replacement ordered --Monitor BMP, Mg levels & replace PRN  DVT prophylaxis: lovenox (plan restart Xarelto on 06/17)        Code Status: FULL CODE     TOC needs / Dispo plan: Skilled nursing facility   Barriers to discharge / significant pending items: Pt tested +Covid today after low grade fever 100.1 earlier this AM.  Elevated LFT's require close monitoring.  Needs SNF  after appropriate isolation for Covid.     Family Communication: Daughters present at bedside     Physical Exam  General exam: awake, alert, no acute distress HEENT: moist mucus membranes, hearing grossly normal  Respiratory system: CTAB, no wheezes, rales or rhonchi, normal respiratory effort. Cardiovascular system: irregularly irregular, no JVD, murmurs, rubs, gallops, no pedal edema.   Gastrointestinal system: soft, NT, ND, no HSM felt, +bowel sounds. Central nervous system: A&O x 3. no gross focal neurologic deficits, normal speech Extremities: no edema, normal tone Skin: dry, intact, normal temperature Psychiatry: normal mood, congruent affect, judgement and insight appear normal        Data Reviewed:  Notable labs --- K 3.2, BUN low 7, Ca 7.6, albumin 7.5, AST 225, ALT 129, Tprotein 6.0, lactic acid 1.1 >> 1.0. Procal < 0.10. CBC with no leukocytosis, Hbg 10.4 from 11.3, 10.9, stable.    COVID-19 PCR positive Respiratory viral panel, Influenza A/B, RSV -- all negative Acute hepatitis panel -- negative      Vitals:   03/28/23 0504 03/28/23 0700 03/28/23 1300 03/28/23 1700  BP:  (!) 126/56 (!) 133/50 (!) 165/56  Pulse:  (!) 16  68  Resp:  17 17 17   Temp: 97.7 F (36.5 C) 98.3 F (36.8 C) 98.1 F (36.7 C) 98 F (36.7 C)  TempSrc:  Oral Oral Oral  SpO2:      Weight:      Height:       Author: Loyce Dys, MD 03/28/2023 5:57 PM  For on call review [email or web address].

## 2023-03-29 NOTE — Progress Notes (Signed)
       CROSS COVER NOTE  NAME: Sheryl Michael MRN: [id number] DOB : Apr 17, 1931    Concern as stated by nurse / staff   pts HR and RR make her MEWS 3 yellow, pt is resting comfortably in bed, she is in a-fib HR between 90-120,   1   3:49 AM JF MEWS is still a 3, slight temp 100.1        Pertinent findings on chart review: Awaiting SNF from lumbar fusion  Assessment and  Interventions   Assessment: Yellow Mews with mild tachycardia and low-grade temp of 100.1 Suspect SIRS,  Plan: Get urinalysis and chest x-ray and sepsis labs X X

## 2023-03-29 NOTE — TOC Progression Note (Signed)
Transition of Care [facility]) - Progression Note    Patient Details  Name: Sheryl Michael MRN: [id number] Date of Birth: [birth year]  [facility]) CM/SW Contact  Truddie Hidden, RN Phone Number: 03/29/2023, 4:05 PM  Clinical Narrative:    [name] with Tammy, AC, at  [facility]. She was advised of patient's COVID+ status. If patient is asymptomatic she may quarantine 5 day prior to admitting to facility. If patient is symptomatic she will have to quarantine 10 days from day of diagnosis prior to admitting to facility.         Expected Discharge Plan and Services                                               Social Determinants of Health (SDOH) Interventions SDOH Screenings   Food Insecurity: No Food Insecurity (03/26/2023)  Housing: Low Risk  (03/26/2023)  Transportation Needs: No Transportation Needs (03/26/2023)  Utilities: Not At Risk (03/26/2023)  Tobacco Use: Low Risk  (03/20/2023)    Readmission Risk Interventions     No data to display

## 2023-03-29 NOTE — Progress Notes (Signed)
Occupational Therapy Treatment Patient Details Name: TAMEIKA SLIGH MRN: [id number] DOB: 01-02-1931 Today's Date: 03/29/2023   History of present illness BRENISHA KODER is an 89yoF who comes to [facility] 5/31, found on floor by staff at [facility] ILF. PMH: AF, HTN, HLD, hypoTSH. Thoracic MRI revealing of acute T9, T10 fractures, unstable T11 body fracture, acute T1 fracture. Pt taken to OR with neurosurgical Dr. Myer Haff, is now s/p T8-L2 PSF with T8-9 and L1-2 cement augmentation for T11 and L1 fractures.   OT comments  Ms. Barsoum demonstrated good progress today, with reduced pain and with less assistance required for movements. She denies pain in supine. Pt is able to transfer to EOB sitting without physical assistance from therapist, although with greatly increased time and effort and with a rest break mid-transfer. Pt rates pain as 4/10 sitting EOB. She requires Max A for donning TLSO brace. Pt comes into standing with RW and Min A, reports she feels somewhat "shaky" and dizzy with standing, however, she is able to maintain fair standing balance for ~ 4 minutes before needing to sit back down. Pt is able to transfer to recliner w/ close SUPV for safety, but no physical assistance needed. Once in recliner, she is able to [facility] reposition herself in chair for a more comfortable seated position. She reports pain w/ standing and sitting in recliner as 3/10.    Recommendations for follow up therapy are one component of a multi-disciplinary discharge planning process, led by the attending physician.  Recommendations may be updated based on patient status, additional functional criteria and insurance authorization.    Assistance Recommended at Discharge Frequent or constant Supervision/Assistance  Patient can return home with the following  A little help with walking and/or transfers;A lot of help with bathing/dressing/bathroom;Assistance with cooking/housework;Assist for transportation;Help with stairs or  ramp for entrance   Equipment Recommendations  None recommended by OT    Recommendations for Other Services      Precautions / Restrictions Precautions Precautions: Back;Fall Required Braces or Orthoses: Spinal Brace Spinal Brace: Thoracolumbosacral orthotic;Applied in sitting position Spinal Brace Comments: for OOB Restrictions Weight Bearing Restrictions: No       Mobility Bed Mobility Overal bed mobility: Needs Assistance Bed Mobility: Supine to Sit     Supine to sit: Supervision     General bed mobility comments: Pt able to perform supine<sit w/o any physical assistance from therapist    Transfers Overall transfer level: Needs assistance Equipment used: Rolling walker (2 wheels) Transfers: Sit to/from Stand, Bed to chair/wheelchair/BSC Sit to Stand: Min assist     Step pivot transfers: Min guard           Balance Overall balance assessment: Needs assistance Sitting-balance support: Feet supported, Bilateral upper extremity supported Sitting balance-Leahy Scale: Fair     Standing balance support: Bilateral upper extremity supported, During functional activity, Reliant on assistive device for balance Standing balance-Leahy Scale: Fair                             ADL either performed or assessed with clinical judgement   ADL                                              Extremity/Trunk Assessment Upper Extremity Assessment Upper Extremity Assessment: Generalized weakness   Lower Extremity Assessment Lower  Extremity Assessment: Generalized weakness   Cervical / Trunk Assessment Cervical / Trunk Assessment: Back Surgery    Vision       Perception     Praxis      Cognition Arousal/Alertness: Awake/alert Behavior During Therapy: WFL for tasks assessed/performed Overall Cognitive Status: Within Functional Limits for tasks assessed                                          Exercises Other  Exercises Other Exercises: marching in place and static standing.    Shoulder Instructions       General Comments Total A to don TLSO while seated EOB    Pertinent Vitals/ Pain       Pain Assessment Pain Assessment: 0-10 Pain Score: 4  Pain Location: back Pain Descriptors / Indicators: Grimacing, Guarding, Aching Pain Intervention(s): Limited activity within patient's tolerance, Repositioned, Premedicated before session  Home Living                                          Prior Functioning/Environment              Frequency  Min 3X/week        Progress Toward Goals  OT Goals(current goals can now be found in the care plan section)  Progress towards OT goals: Progressing toward goals  Acute Rehab OT Goals OT Goal Formulation: With patient Time For Goal Achievement: 04/04/23 Potential to Achieve Goals: Good  Plan Discharge plan remains appropriate;Frequency remains appropriate    Co-evaluation                 AM-PAC OT "6 Clicks" Daily Activity     Outcome Measure   Help from another person eating meals?: None Help from another person taking care of personal grooming?: A Little Help from another person toileting, which includes using toliet, bedpan, or urinal?: A Lot Help from another person bathing (including washing, rinsing, drying)?: A Lot Help from another person to put on and taking off regular upper body clothing?: A Little Help from another person to put on and taking off regular lower body clothing?: A Lot 6 Click Score: 16    End of Session Equipment Utilized During Treatment: Rolling walker (2 wheels);Back brace  OT Visit Diagnosis: Other abnormalities of gait and mobility (R26.89);Muscle weakness (generalized) (M62.81);History of falling (Z91.81);Pain   Activity Tolerance Patient tolerated treatment well   Patient Left in chair;with call bell/phone within reach;with nursing/sitter in room;with family/visitor  present   Nurse Communication          Time: 3762-8315 OT Time Calculation (min): 21 min  Charges: OT General Charges $OT Visit: 1 Visit OT Treatments $Self Care/Home Management : 8-22 mins Latina Craver, PhD, MS, OTR/L 03/29/23, 11:50 AM

## 2023-03-29 NOTE — Plan of Care (Signed)

## 2023-03-29 NOTE — Progress Notes (Signed)
12 lead ECG complete, labs drawn, CXR done, NaCl bolus infusing, new purewick placed with new tubing and canister>> waiting for pt to urinate.

## 2023-03-29 NOTE — Sepsis Progress Note (Signed)
Notified provider of need to order antibiotics for the sepsis protocol if appropriate for this patient.

## 2023-03-29 NOTE — Progress Notes (Signed)
Sepsis protocol is being followed by [facility]. 

## 2023-03-29 NOTE — TOC Progression Note (Signed)
Transition of Care [facility]) - Progression Note    Patient Details  Name: Sheryl Michael MRN: [id number] Date of Birth: Dec 08, 1930  Transition of Care [facility]) CM/SW Contact  Truddie Hidden, RN Phone Number: 03/29/2023, 10:09 AM  Clinical Narrative:    Patient approved for [facility]  Tammy, [facility] Admissions Coordinator notified. MD notified.         Expected Discharge Plan and Services                                               Social Determinants of Health (SDOH) Interventions SDOH Screenings   Food Insecurity: No Food Insecurity (03/26/2023)  Housing: Low Risk  (03/26/2023)  Transportation Needs: No Transportation Needs (03/26/2023)  Utilities: Not At Risk (03/26/2023)  Tobacco Use: Low Risk  (03/20/2023)    Readmission Risk Interventions     No data to display

## 2023-03-30 DIAGNOSIS — S22089A Unspecified fracture of T11-T12 vertebra, initial encounter for closed fracture: Secondary | ICD-10-CM | POA: Diagnosis not present

## 2023-03-30 DIAGNOSIS — K219 Gastro-esophageal reflux disease without esophagitis: Secondary | ICD-10-CM | POA: Diagnosis not present

## 2023-03-30 DIAGNOSIS — E039 Hypothyroidism, unspecified: Secondary | ICD-10-CM | POA: Diagnosis not present

## 2023-03-30 DIAGNOSIS — U071 COVID-19: Secondary | ICD-10-CM | POA: Diagnosis not present

## 2023-03-30 LAB — COMPREHENSIVE METABOLIC PANEL
ALT: 95 U/L — ABNORMAL HIGH (ref 0–44)
AST: 134 U/L — ABNORMAL HIGH (ref 15–41)
Albumin: 2.7 g/dL — ABNORMAL LOW (ref 3.5–5.0)
Alkaline Phosphatase: 110 U/L (ref 38–126)
Anion gap: 10 (ref 5–15)
BUN: 12 mg/dL (ref 8–23)
CO2: 24 mmol/L (ref 22–32)
Calcium: 8.3 mg/dL — ABNORMAL LOW (ref 8.9–10.3)
Chloride: 103 mmol/L (ref 98–111)
Creatinine, Ser: 0.79 mg/dL (ref 0.44–1.00)
GFR, Estimated: >60 mL/min (ref >60)
Glucose, Bld: 88 mg/dL (ref 70–99)
Potassium: 4.1 mmol/L (ref 3.5–5.1)
Sodium: 137 mmol/L (ref 135–145)
Total Bilirubin: 0.5 mg/dL (ref 0.3–1.2)
Total Protein: 6 g/dL — ABNORMAL LOW (ref 6.5–8.1)

## 2023-03-30 LAB — MAGNESIUM: Magnesium: 2 mg/dL (ref 1.7–2.4)

## 2023-03-30 NOTE — Plan of Care (Signed)
  Problem: Education: Goal: Knowledge of General Education information will improve Description: Including pain rating scale, medication(s)/side effects and non-pharmacologic comfort measures Outcome: Progressing   Problem: Health Behavior/Discharge Planning: Goal: Ability to manage health-related needs will improve Outcome: Progressing   Problem: Clinical Measurements: Goal: Ability to maintain clinical measurements within normal limits will improve Outcome: Progressing Goal: Will remain free from infection Outcome: Progressing Goal: Diagnostic test results will improve Outcome: Progressing Goal: Respiratory complications will improve Outcome: Progressing Goal: Cardiovascular complication will be avoided Outcome: Progressing   Problem: Activity: Goal: Risk for activity intolerance will decrease Outcome: Progressing   Problem: Nutrition: Goal: Adequate nutrition will be maintained Outcome: Progressing   Problem: Coping: Goal: Level of anxiety will decrease Outcome: Progressing   Problem: Elimination: Goal: Will not experience complications related to bowel motility Outcome: Progressing Goal: Will not experience complications related to urinary retention Outcome: Progressing   Problem: Pain Managment: Goal: General experience of comfort will improve Outcome: Progressing   Problem: Safety: Goal: Ability to remain free from injury will improve Outcome: Progressing   Problem: Skin Integrity: Goal: Risk for impaired skin integrity will decrease Outcome: Progressing   Problem: Fluid Volume: Goal: Hemodynamic stability will improve Outcome: Progressing   Problem: Clinical Measurements: Goal: Diagnostic test results will improve Outcome: Progressing Goal: Signs and symptoms of infection will decrease Outcome: Progressing   Problem: Respiratory: Goal: Ability to maintain adequate ventilation will improve Outcome: Progressing   Problem: Education: Goal:  Knowledge of risk factors and measures for prevention of condition will improve Outcome: Progressing   Problem: Coping: Goal: Psychosocial and spiritual needs will be supported Outcome: Progressing   Problem: Respiratory: Goal: Will maintain a patent airway Outcome: Progressing Goal: Complications related to the disease process, condition or treatment will be avoided or minimized Outcome: Progressing   

## 2023-03-30 NOTE — Progress Notes (Addendum)
Progress Note   Patient: Sheryl Michael GNF:[id number] DOB: [birth year] DOA: 03/17/2023     13 DOS: the patient was seen and examined on 03/30/2023     Brief Narrative / Hospital Course:  Sheryl Michael is a 89 year old female history of atrial fibrillation, hypertension, hyperlipidemia, hypothyroid. She presents emergency department 03/17/23 from [facility] assisted living for chief concerns of back pain of unknown etiology. Question trauma as she was found on the floor. She does not think that she fell, she thought that she just laid down on the floor because her back was hurting her.  She denies any neurological symptoms currently other than pain. 05/31: MRI shows T11 and likely L1 fx are acute, and do not suggest obvious instability. Neurosurgery advised TLSO and T-spine upright xrays once in brace to evaluate alignment 06/01: repeat XR show increasing angulation, planning for surgery for spinal fusion Monday following Xarelto washout. Afib w/ intermittent RVR, IV dilt push ineffective, started dilt gtt and converted to NSR. Paused gtt , increased po dose dilt, no further events until...  06/02: Again in rapid Afib early AM, restarted dilt gtt + heparin gtt anticipating surgery.  06/03: T8-L2 posterior spinal fusion w/ T8-9 and L1-2 cement augmentation, for T11 and L1 fractures.  06/04: NSR off dilt gtt. Stable post-op, PT/OT to see.    6/12 -- temp 100.1 early AM.  Tested + Covid.  Discharge to SNF delayed.  Recurrent A-fib with RVR, HR's 110-120's at rest.     Subjective / Interval Hx:  Patient awake sitting up with bed with daughters at bedside this AM.  She reports a cough but otherwise feeling okay.  Back remains sore but controlled fairly well.     ASSESSMENT & PLAN:   Principal Problem:   Closed T11 fracture (HCC) Active Problems:   Malignant neoplasm of breast (HCC)   GERD (gastroesophageal reflux disease)   HLD (hyperlipidemia)   OP (osteoporosis)   Hypothyroidism,  acquired   Carcinoma of lower-outer quadrant of left breast in female, estrogen receptor positive (HCC)   Inadequate pain control   Depression     Closed T11, L1 fractures Sheryl Michael postsurgery MRI shows only T11 and likely L1 fx are acute, and do not suggest instability. Patient underwent  T8-L2 posterior spinal fusion w/ T8-9 and L1-2 cement augmentation, for T11 and L1 fractures with neurosurgery on 03/21/2023 --Follow up with Dr. Myer Haff --Continue with PT OT --As needed pain control --TOC following for placement   Afib RVR-improved >> recurrent in setting of +Covid infection Chronic  paroxysmal Afib --On Xarelto at home - held until at least 14 days post-op per neurosurgery (unless life threatening need for full anticoagulation). --On Lovenox for DVT ppx for now --Continue Cardizem --Continue metoprolol -- increased 12.5 >> 25 mg BID (6/12) --PRN IV metoprolol for HR's sustained > 110 bpm --6/12 -- HR's elevated again 110-120's at rest on rounds this AM.  Due to Covid infection. --6/13 -- HR's improved, today in 50's to 60s  Covid-19 infection -- pt tested positive 6/12 after elevated temp 100.1 and new cough reported.   --Airborne & contact precautions --Delays SNF placement for required isolation  --Supportive / symptomatic care as needed   Malignant neoplasm of breast --Continue anastrozole   HLD (hyperlipidemia)-stable --Continue atorvastatin   Hypothyroidism, acquired --Continue levothyroxine    Depression, Dementia-stable --Continue escitalopram, memantine 5 mg po daily resumed   Hypokalemia - recurrent K 3.3 was replaced on 03/24/2023, then normalized 6/12 K 3.2 -  further replacement ordered 6/13: K 4.1 normal --Monitor BMP, Mg levels & replace PRN    DVT prophylaxis: lovenox (plan restart Xarelto on 06/17)        Code Status: FULL CODE     TOC needs / Dispo plan: Skilled nursing facility   Barriers to discharge / significant pending items: Pt tested  +Covid 6/12.  Needs SNF after appropriate isolation for Covid.     Family Communication: Daughter [name] updated by phone this afternoon.     Physical Exam  General exam: awake, appears drowsy, no acute distress HEENT: moist mucus membranes, hearing grossly normal  Respiratory system: CTAB, no wheezes, rales or rhonchi, normal respiratory effort. Cardiovascular system: RRR, no pedal edema.   Gastrointestinal system: soft, NT, ND Central nervous system: A&O x 3. no gross focal neurologic deficits, normal speech Extremities: no edema, normal tone Skin: dry, intact, normal temperature Psychiatry: normal mood, flat affect, judgement and insight appear normal        Data Reviewed:  Notable labs --- Ca 8.3, albumin 2.7, AST improved 134, ALT improved 95, Tprotein 6.0   03/29/23: COVID-19 PCR positive Respiratory viral panel, Influenza A/B, RSV -- all negative Acute hepatitis panel -- negative      Vitals:   03/30/23 0800 03/30/23 0900 03/30/23 1100 03/30/23 1500  BP: (!) 166/56  (!) 137/48 (!) 140/47  Pulse: (!) 53  (!) 59 63  Resp: 20  17 13   Temp:  (!) 97.5 F (36.4 C) 97.6 F (36.4 C) 98.4 F (36.9 C)  TempSrc:   Oral Oral  SpO2: 97%  97% 92%  Weight:      Height:       Author: Pennie Banter, DO 03/30/2023 4:40 PM  For on call review [email or web address].

## 2023-03-31 DIAGNOSIS — I4891 Unspecified atrial fibrillation: Secondary | ICD-10-CM | POA: Diagnosis not present

## 2023-03-31 LAB — COMPREHENSIVE METABOLIC PANEL
ALT: 68 U/L — ABNORMAL HIGH (ref 0–44)
AST: 81 U/L — ABNORMAL HIGH (ref 15–41)
Albumin: 2.3 g/dL — ABNORMAL LOW (ref 3.5–5.0)
Alkaline Phosphatase: 113 U/L (ref 38–126)
Anion gap: 12 (ref 5–15)
BUN: 14 mg/dL (ref 8–23)
CO2: 26 mmol/L (ref 22–32)
Calcium: 8.3 mg/dL — ABNORMAL LOW (ref 8.9–10.3)
Chloride: 102 mmol/L (ref 98–111)
Creatinine, Ser: 0.72 mg/dL (ref 0.44–1.00)
GFR, Estimated: >60 mL/min (ref >60)
Glucose, Bld: 76 mg/dL (ref 70–99)
Potassium: 3.8 mmol/L (ref 3.5–5.1)
Sodium: 140 mmol/L (ref 135–145)
Total Bilirubin: 0.4 mg/dL (ref 0.3–1.2)
Total Protein: 6.3 g/dL — ABNORMAL LOW (ref 6.5–8.1)

## 2023-03-31 LAB — CULTURE, BLOOD (ROUTINE X 2)
Culture: NO GROWTH
Culture: NO GROWTH

## 2023-03-31 NOTE — Progress Notes (Signed)
Occupational Therapy Treatment Patient Details Name: Sheryl Michael MRN: [id number] DOB: June 13, 1931 Today's Date: 03/31/2023   History of present illness Sheryl Michael is an 89yoF who comes to [facility] 5/31, found on floor by staff at [facility] IL[facility]. PMH: AF, HTN, HLD, hypoTSH. Thoracic MRI revealing of acute T9, T10 fractures, unstable T11 body fracture, acute T1 fracture. Pt taken to OR with neurosurgical Dr. Myer Haff, is now s/p T8-L2 PSF with T8-9 and L1-2 cement augmentation for T11 and L1 fractures.   OT comments  Pt is visibly fatigued during OOB movement today, but is nevertheless able to move a little more easily and fluidly than earlier this week. She reports no pain while supine, 4/10 pain with transitioning supine<sit, sitting EOB, and moving sit<stand. Max A for donning TLSO brace in sitting and min A + cueing for sit<stand with RW. Once in standing, and with ambulation, pt reports pain drops to 3/10. Will continue to follow POC.   Recommendations for follow up therapy are one component of a multi-disciplinary discharge planning process, led by the attending physician.  Recommendations may be updated based on patient status, additional functional criteria and insurance authorization.    Assistance Recommended at Discharge Frequent or constant Supervision/Assistance  Patient can return home with the following  A little help with walking and/or transfers;A lot of help with bathing/dressing/bathroom;Assistance with cooking/housework;Assist for transportation;Help with stairs or ramp for entrance   Equipment Recommendations  None recommended by OT    Recommendations for Other Services      Precautions / Restrictions Precautions Precautions: Back;Fall Required Braces or Orthoses: Spinal Brace Spinal Brace: Thoracolumbosacral orthotic;Applied in sitting position Spinal Brace Comments: for OOB Restrictions Weight Bearing Restrictions: No       Mobility Bed Mobility Overal bed  mobility: Needs Assistance Bed Mobility: Supine to Sit     Supine to sit: Supervision     General bed mobility comments: Pt able to perform supine<sit w/o any physical assistance from therapist, but with obvious pain Patient Response: Cooperative  Transfers Overall transfer level: Needs assistance Equipment used: Rolling walker (2 wheels) Transfers: Sit to/from Stand, Bed to chair/wheelchair/BSC Sit to Stand: Min assist     Step pivot transfers: Min guard     General transfer comment: Min A for coming into standing from EOB x 2 with RW. Multimodal cueing for hand placement and sequencing     Balance Overall balance assessment: Needs assistance Sitting-balance support: Feet supported, Bilateral upper extremity supported Sitting balance-Leahy Scale: Good     Standing balance support: Bilateral upper extremity supported, During functional activity, Reliant on assistive device for balance Standing balance-Leahy Scale: Fair Standing balance comment: Pt reliant on RW, moves slowly, but did not require any hands-on assist from therapist to maintain standing balance today                           ADL either performed or assessed with clinical judgement   ADL Overall ADL's : Needs assistance/impaired Eating/Feeding: Set up;Minimal assistance Eating/Feeding Details (indicate cue type and reason): required assist with opening some containers Grooming: Wash/dry hands;Set up;Supervision/safety;Sitting                                 General ADL Comments: Max A for donning TLSO    Extremity/Trunk Assessment Upper Extremity Assessment Upper Extremity Assessment: Generalized weakness   Lower Extremity Assessment Lower Extremity Assessment:  Generalized weakness   Cervical / Trunk Assessment Cervical / Trunk Assessment: Back Surgery    Vision       Perception     Praxis      Cognition Arousal/Alertness: Awake/alert Behavior During Therapy: WFL  for tasks assessed/performed Overall Cognitive Status: Within Functional Limits for tasks assessed                                          Exercises Other Exercises Other Exercises: marching in place and static standing.    Shoulder Instructions       General Comments      Pertinent Vitals/ Pain       Pain Assessment Pain Score: 4  Pain Location: back Pain Descriptors / Indicators: Grimacing, Guarding, Aching Pain Intervention(s): Limited activity within patient's tolerance, Repositioned, Monitored during session  Home Living                                          Prior Functioning/Environment              Frequency  Min 3X/week        Progress Toward Goals  OT Goals(current goals can now be found in the care plan section)  Progress towards OT goals: Progressing toward goals  Acute Rehab OT Goals OT Goal Formulation: With patient Time For Goal Achievement: 04/04/23 Potential to Achieve Goals: Good  Plan Discharge plan remains appropriate;Frequency remains appropriate    Co-evaluation                 AM-PAC OT "6 Clicks" Daily Activity     Outcome Measure   Help from another person eating meals?: A Little Help from another person taking care of personal grooming?: A Little Help from another person toileting, which includes using toliet, bedpan, or urinal?: A Lot Help from another person bathing (including washing, rinsing, drying)?: A Lot Help from another person to put on and taking off regular upper body clothing?: A Little Help from another person to put on and taking off regular lower body clothing?: A Lot 6 Click Score: 15    End of Session Equipment Utilized During Treatment: Rolling walker (2 wheels);Back brace  OT Visit Diagnosis: Other abnormalities of gait and mobility (R26.89);Muscle weakness (generalized) (M62.81);History of falling (Z91.81);Pain   Activity Tolerance Patient tolerated treatment  well   Patient Left in chair;with call bell/phone within reach   Nurse Communication          Time: 2841-3244 OT Time Calculation (min): 32 min  Charges: OT General Charges $OT Visit: 1 Visit OT Treatments $Self Care/Home Management : 23-37 mins Latina Craver, PhD, MS, OTR/L 03/31/23, 10:16 AM

## 2023-03-31 NOTE — Progress Notes (Signed)
Physical Therapy Treatment Patient Details Name: Sheryl Michael MRN: [id number] DOB: [birth year] Today's Date: 03/31/2023   History of Present Illness Sheryl Michael is an 89yoF who comes to [facility] 5/31, found on floor by staff at [facility]. PMH: AF, HTN, HLD, hypoTSH. Thoracic MRI revealing of acute T9, T10 fractures, unstable T11 body fracture, acute T1 fracture. Pt taken to OR with neurosurgical Dr. Myer Haff, is now s/p T8-L2 PSF with T8-9 and L1-2 cement augmentation for T11 and L1 fractures. Pt tested (+) COVID19 on 6/12.    PT Comments    Pt in recliner on entry, reports to be doing ok, pain 3/10, still fatigued. Pt agreeable to PT session. Generally good tolerance with seated leg exercises, then we move to transfers training. Author facilitates seated height to maintain pain fluctuations within desired range, pt able to be minGuard from recliner + pillow. Pt partakes in standing weight shifting activity, no LOB, safe use of RW noted. The degree of stepping tolerated at bedside would almost certainly allow for AMB of >35ft. Marching in place at bedside results in only significant fluctuation in HR despite RVR on entry and during other activity, rates peak in 130s-140s briefly, at which point activity is ceased and pt is asked to return to sitting (she appears comfortable). Pt continues to make progress in general. Pt requires repeat cues for accuracy, unable to r/o ST memory defs v HOH. Will continue to follow.    Recommendations for follow up therapy are one component of a multi-disciplinary discharge planning process, led by the attending physician.  Recommendations may be updated based on patient status, additional functional criteria and insurance authorization.  Follow Up Recommendations  Can patient physically be transported by private vehicle: No    Assistance Recommended at Discharge Frequent or constant Supervision/Assistance  Patient can return home with the following Assist for  transportation;A little help with walking and/or transfers;A little help with bathing/dressing/bathroom   Equipment Recommendations  None recommended by PT    Recommendations for Other Services       Precautions / Restrictions Precautions Precautions: Back;Fall Precaution Booklet Issued: Yes (comment) Required Braces or Orthoses: Spinal Brace Spinal Brace: Thoracolumbosacral orthotic;Applied in sitting position Spinal Brace Comments: for OOB Restrictions Weight Bearing Restrictions: No     Mobility  Bed Mobility                    Transfers Overall transfer level: Needs assistance Equipment used: Rolling walker (2 wheels) Transfers: Sit to/from Stand Sit to Stand: Min assist           General transfer comment: minguard when pillow is added to chair    Ambulation/Gait                   Stairs             Wheelchair Mobility    Modified Rankin (Stroke Patients Only)       Balance                                            Cognition                                                Exercises  General Exercises - Lower Extremity Long Arc Quad: AROM, 10 reps, Seated, Both Hip Flexion/Marching: AROM, Seated, 10 reps, Both Heel Raises: Seated, AROM, Both, 15 reps Other Exercises Other Exercises: STS from recliner + pillow x3, up for 15-30sec each Other Exercises: anterior 6" step taps, alternating feet, 2 hand support on RW.    General Comments        Pertinent Vitals/Pain Pain Assessment Pain Assessment: 0-10 Pain Score: 3  Pain Location: back Pain Descriptors / Indicators: Grimacing, Guarding, Aching Pain Intervention(s): Limited activity within patient's tolerance, Monitored during session, Premedicated before session, Repositioned    Home Living                          Prior Function            PT Goals (current goals can now be found in the care plan section) Acute  Rehab PT Goals Patient Stated Goal: regain strength and independent mobility PT Goal Formulation: With patient Time For Goal Achievement: 04/06/23 Potential to Achieve Goals: Fair Progress towards PT goals: Progressing toward goals    Frequency    Min 4X/week      PT Plan Current plan remains appropriate    Co-evaluation              AM-PAC PT "6 Clicks" Mobility   Outcome Measure  Help needed turning from your back to your side while in a flat bed without using bedrails?: A Lot Help needed moving from lying on your back to sitting on the side of a flat bed without using bedrails?: A Lot Help needed moving to and from a bed to a chair (including a wheelchair)?: A Lot Help needed standing up from a chair using your arms (e.g., wheelchair or bedside chair)?: A Lot Help needed to walk in hospital room?: A Little Help needed climbing 3-5 steps with a railing? : A Lot 6 Click Score: 13    End of Session Equipment Utilized During Treatment: Gait belt Activity Tolerance: Patient tolerated treatment well;No increased pain Patient left: with call bell/phone within reach;in chair Nurse Communication: Mobility status PT Visit Diagnosis: Difficulty in walking, not elsewhere classified (R26.2);Other abnormalities of gait and mobility (R26.89);Muscle weakness (generalized) (M62.81);Other symptoms and signs involving the nervous system (R29.898)     Time: 1610-9604 PT Time Calculation (min) (ACUTE ONLY): 28 min  Charges:  $Therapeutic Exercise: 8-22 mins $Therapeutic Activity: 8-22 mins                12:41 PM, 03/31/23 [name], PT, DPT Physical Therapist - [facility]  [phone] (ASCOM)    Madilynn Montante C 03/31/2023, 12:37 PM

## 2023-03-31 NOTE — TOC Progression Note (Signed)
Transition of Care [facility]) - Progression Note    Patient Details  Name: Sheryl Michael MRN: [id number] Date of Birth: 04-18-1931  Transition of Care [facility]) CM/SW Contact  Truddie Hidden, RN Phone Number: 03/31/2023, 11:37 AM  Clinical Narrative:    TOC continues to provide ongoing  assessment for needs and discharge planning.        Expected Discharge Plan and Services                                               Social Determinants of Health (SDOH) Interventions SDOH Screenings   Food Insecurity: No Food Insecurity (03/26/2023)  Housing: Low Risk  (03/26/2023)  Transportation Needs: No Transportation Needs (03/26/2023)  Utilities: Not At Risk (03/26/2023)  Tobacco Use: Low Risk  (03/20/2023)    Readmission Risk Interventions     No data to display

## 2023-03-31 NOTE — Progress Notes (Signed)
Progress Note   Patient: Sheryl Michael:[id number] DOB: 1931-01-21 DOA: 03/17/2023     14 DOS: the patient was seen and examined on 03/31/2023     Brief Narrative / Hospital Course:  Ms. Latavia Burell is a 89 year old female history of atrial fibrillation, hypertension, hyperlipidemia, hypothyroid. She presents emergency department 03/17/23 from [facility] assisted living for chief concerns of back pain of unknown etiology. Question trauma as she was found on the floor. She does not think that she fell, she thought that she just laid down on the floor because her back was hurting her.  She denies any neurological symptoms currently other than pain. 05/31: MRI shows T11 and likely L1 fx are acute, and do not suggest obvious instability. Neurosurgery advised TLSO and T-spine upright xrays once in brace to evaluate alignment 06/01: repeat XR show increasing angulation, planning for surgery for spinal fusion Monday following Xarelto washout. Afib w/ intermittent RVR, IV dilt push ineffective, started dilt gtt and converted to NSR. Paused gtt , increased po dose dilt, no further events until...  06/02: Again in rapid Afib early AM, restarted dilt gtt + heparin gtt anticipating surgery.  06/03: T8-L2 posterior spinal fusion w/ T8-9 and L1-2 cement augmentation, for T11 and L1 fractures.  06/04: NSR off dilt gtt. Stable post-op, PT/OT to see.    6/12 -- temp 100.1 early AM.  Tested + Covid.  Discharge to SNF delayed.  Recurrent A-fib with RVR, HR's 110-120's at rest.     Subjective / Interval Hx:  Patient sleeping, woke to voice.  Reports back pain worse today. She had just return to bed from recliner after PT, states up in the chair made back pain worse today.  Reports poor appetite, profound fatigue and feeling generally weak.   No acute events reported.   ASSESSMENT & PLAN:   Principal Problem:   Closed T11 fracture (HCC) Active Problems:   Malignant neoplasm of breast (HCC)   GERD  (gastroesophageal reflux disease)   HLD (hyperlipidemia)   OP (osteoporosis)   Hypothyroidism, acquired   Carcinoma of lower-outer quadrant of left breast in female, estrogen receptor positive (HCC)   Inadequate pain control   Depression     Closed T11, L1 fractures [facility] postsurgery MRI shows only T11 and likely L1 fx are acute, and do not suggest instability. Patient underwent  T8-L2 posterior spinal fusion w/ T8-9 and L1-2 cement augmentation, for T11 and L1 fractures with neurosurgery on 03/21/2023 --Follow up with Dr. Myer Haff --Continue with PT OT --As needed pain control --TOC following for placement   Afib RVR-improved >> recurrent in setting of +Covid infection Chronic  paroxysmal Afib --On Xarelto at home - held until at least 14 days post-op per neurosurgery (unless life threatening need for full anticoagulation). --On Lovenox for DVT ppx for now --Continue Cardizem --Continue metoprolol -- increased 12.5 >> 25 mg BID (6/12) --PRN IV metoprolol for HR's sustained > 110 bpm --6/12 -- HR's elevated again 110-120's at rest on rounds this AM.  Due to Covid infection. --6/13 -- HR's improved, today in 50's to 60s --6/14 -- HR's overall controlled but spike up to as high as 130's with PT  Covid-19 infection -- pt tested positive 6/12 after elevated temp 100.1 and new cough reported.   --Airborne & contact precautions --Delays SNF placement for required isolation  --Supportive / symptomatic care as needed   Malignant neoplasm of breast --Continue anastrozole   HLD (hyperlipidemia)-stable --Continue atorvastatin   Hypothyroidism, acquired --  Continue levothyroxine    Depression, Dementia-stable --Continue escitalopram, memantine    Hypokalemia - resolved Resolved with replacement --Monitor BMP, Mg levels & replace PRN    DVT prophylaxis: lovenox (plan restart Xarelto on 06/17)        Code Status: FULL CODE     TOC needs / Dispo plan: Skilled nursing  facility   Barriers to discharge / significant pending items: Pt tested +Covid 6/12.  Needs SNF after appropriate isolation for Covid.     Family Communication: Daughter [name] updated by phone 6/13  afternoon.     Physical Exam  General exam: sleeping, wakes to voice, no acute distress HEENT: moist mucus membranes, hearing grossly normal  Respiratory system: CTAB, no wheezes, rales or rhonchi, normal respiratory effort. Cardiovascular system: RRR, no pedal edema.   Gastrointestinal system: soft, NT, ND Central nervous system: A&O x 3. no gross focal neurologic deficits, normal speech Extremities: no edema, normal tone Skin: dry, intact, normal temperature Psychiatry: depressed mood, flat affect, judgement and insight appear normal        Data Reviewed:  Notable labs --- Ca 8.3, albumin 2.3, AST improved 81, ALT improved 68, Tprotein 6.3   03/29/23: COVID-19 PCR positive Respiratory viral panel, Influenza A/B, RSV -- all negative Acute hepatitis panel -- negative      Vitals:   03/30/23 2000 03/30/23 2224 03/31/23 0000 03/31/23 0400  BP: (!) 125/45  (!) 112/46 132/72  Pulse: (!) 53 (!) 113 87 89  Resp: 12  (!) 24 17  Temp: 97.9 F (36.6 C)  (!) 97.5 F (36.4 C) 98 F (36.7 C)  TempSrc: Oral  Oral Oral  SpO2: 95%  95% 96%  Weight:    57.1 kg  Height:       Author: Pennie Banter, DO 03/31/2023 2:22 PM  For on call review [email or web address].

## 2023-04-01 DIAGNOSIS — I4891 Unspecified atrial fibrillation: Secondary | ICD-10-CM | POA: Diagnosis not present

## 2023-04-01 DIAGNOSIS — U071 COVID-19: Secondary | ICD-10-CM | POA: Diagnosis not present

## 2023-04-01 LAB — CULTURE, BLOOD (ROUTINE X 2): Special Requests: ADEQUATE

## 2023-04-01 NOTE — Progress Notes (Addendum)
Progress Note   Patient: Sheryl Michael:[id number] DOB: 1931/04/04 DOA: 03/17/2023     15 DOS: the patient was seen and examined on 04/01/2023     Brief Narrative / Hospital Course:  Ms. Liat Sietsema is a 89 year old female history of atrial fibrillation, hypertension, hyperlipidemia, hypothyroid. She presents emergency department 03/17/23 from [facility] assisted living for chief concerns of back pain of unknown etiology. Question trauma as she was found on the floor. She does not think that she fell, she thought that she just laid down on the floor because her back was hurting her.  She denies any neurological symptoms currently other than pain. 05/31: MRI shows T11 and likely L1 fx are acute, and do not suggest obvious instability. Neurosurgery advised TLSO and T-spine upright xrays once in brace to evaluate alignment 06/01: repeat XR show increasing angulation, planning for surgery for spinal fusion Monday following Xarelto washout. Afib w/ intermittent RVR, IV dilt push ineffective, started dilt gtt and converted to NSR. Paused gtt , increased po dose dilt, no further events until...  06/02: Again in rapid Afib early AM, restarted dilt gtt + heparin gtt anticipating surgery.  06/03: T8-L2 posterior spinal fusion w/ T8-9 and L1-2 cement augmentation, for T11 and L1 fractures.  06/04: NSR off dilt gtt. Stable post-op, PT/OT to see.    6/12 -- temp 100.1 early AM.  Tested + Covid.  Discharge to SNF delayed.  Recurrent A-fib with RVR, HR's 110-120's at rest.  6/13 -- 15: remains in A-fib, resting rates are fair 90's to 100's.  HR spikes to 130's-140 with PT but recovers with rest.  Soft BP's limited ability to escalate rate control meds at this time.     Subjective / Interval Hx:  Patient up in recliner when seen this AM.  She reports feeling very poorly.  Feels fatigued, worn out.  Back pain is ongoing but overall controlled with meds.  Denies palpitations, CP, SOB, dizziness or  lightheadedness when working with therapy and HR spiked.     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 Patient underwent  T8-L2 posterior spinal fusion w/ T8-9 and L1-2 cement augmentation, for T11 and L1 fractures with neurosurgery on 03/21/2023 --Follow up with Dr. Myer Haff --Continue with PT OT --As needed pain control --TOC following for placement   Afib RVR-improved >> recurrent in setting of +Covid infection Chronic  paroxysmal Afib --On Xarelto at home - held until at least 14 days post-op per neurosurgery (unless life threatening need for full anticoagulation). --On Lovenox for DVT ppx for now --Continue Cardizem --Continue metoprolol -- increased 12.5 >> 25 mg BID (6/12) --PRN IV metoprolol for HR's sustained > 110 bpm --6/12 -- HR's elevated again 110-120's at rest on rounds this AM.  Due to Covid infection. --6/13 -- HR's improved, today in 50's to 60s --6/14 -- HR's overall controlled but spike up to as high as 130's with PT --6/15 -- HR's 90's to low 100's at rest  Covid-19 infection -- pt tested positive  6/12 after elevated temp 100.1 and new cough reported.   --Airborne & contact precautions --Delays SNF placement for required isolation  --Supportive / symptomatic care as needed   Malignant neoplasm of breast --Continue anastrozole   HLD (hyperlipidemia)-stable --Continue atorvastatin   Hypothyroidism, acquired --Continue levothyroxine    Depression, Dementia-stable --Continue escitalopram, memantine     Hypokalemia - resolved Resolved with replacement --Monitor BMP, Mg levels & replace PRN    DVT prophylaxis: lovenox (plan restart Xarelto on 06/17)        Code Status: FULL CODE     TOC needs / Dispo plan: Skilled nursing facility   Barriers to discharge / significant pending items: Pt tested +Covid 6/12.  Needs SNF after appropriate isolation for Covid.     Family Communication: Daughter [name] updated by phone 6/13  afternoon.     Physical Exam  General exam: awake appears drowsy, no acute distress HEENT: moist mucus membranes, hearing grossly normal  Respiratory system: CTAB, no wheezes, rales or rhonchi, normal respiratory effort. Cardiovascular system: RRR, no pedal edema.   Gastrointestinal system: soft, NT, ND Central nervous system: A&O x 3. no gross focal neurologic deficits, normal speech Extremities: no edema, normal tone Skin: dry, intact, normal temperature Psychiatry: depressed mood, flat affect, judgement and insight appear normal        Data Reviewed:  No new labs today    03/29/23: COVID-19 PCR positive Respiratory viral panel, Influenza A/B, RSV -- all negative Acute hepatitis panel -- negative      Vitals:   04/01/23 1128 04/01/23 1129 04/01/23 1130 04/01/23 1131  BP: (!) 116/58     Pulse: 91 65 67 70  Resp: 19 (!) 22 19 15   Temp: 98 F (36.7 C)     TempSrc:      SpO2: 96% 97% 96% 96%  Weight:      Height:       Author: Pennie Banter, DO 04/01/2023 1:55 PM  For on call review [email or web address].

## 2023-04-01 NOTE — Progress Notes (Signed)
Physical Therapy Treatment Patient Details Name: Sheryl Michael MRN: [id number] DOB: 1931-01-14 Today's Date: 04/01/2023   History of Present Illness Sheryl Michael is an 89yoF who comes to [facility] 5/31, found on floor by staff at [facility]. PMH: AF, HTN, HLD, hypoTSH. Thoracic MRI revealing of acute T9, T10 fractures, unstable T11 body fracture, acute T1 fracture. Pt taken to OR with neurosurgical Dr. Myer Haff, is now s/p T8-L2 PSF with T8-9 and L1-2 cement augmentation for T11 and L1 fractures. Pt tested (+) COVID19 on 6/12.    PT Comments    Pt was alert long sitting in bed upon arrival. She is disoriented x 2 but able to follow commands consistently and remains pleasant throughout session. Pt unaware of BM in bed. She performed log roll R to short sit with increased time + min assist. TLSO applied while seated EOB. She stood EOB several times for hygiene care to be performed prior to ambulation around the room. Slow, cautious, step to gait due to pain. Pain overall limiting today. She was repositioned in recliner post session with RN tech at bedside. HR and O2 stable throughout with one short occasion of HR peak into 130s but mostly HR between 95 bpm-120 bpm. Acute PT will continue to follow and progress per current POC.     Recommendations for follow up therapy are one component of a multi-disciplinary discharge planning process, led by the attending physician.  Recommendations may be updated based on patient status, additional functional criteria and insurance authorization.     Assistance Recommended at Discharge Frequent or constant Supervision/Assistance  Patient can return home with the following Assist for transportation;A little help with walking and/or transfers;A little help with bathing/dressing/bathroom   Equipment Recommendations  Other (comment) (Defer to next level of care)       Precautions / Restrictions Precautions Precautions: Back;Fall Precaution Booklet Issued: Yes  (comment) Required Braces or Orthoses: Spinal Brace Spinal Brace: Thoracolumbosacral orthotic;Applied in sitting position Spinal Brace Comments: for OOB Restrictions Weight Bearing Restrictions: No     Mobility  Bed Mobility Overal bed mobility: Needs Assistance Bed Mobility: Rolling, Sidelying to Sit, Supine to Sit Rolling: Min assist Sidelying to sit: Min assist Supine to sit: Min assist  General bed mobility comments: Increased time required. min assist due to pain    Transfers Overall transfer level: Needs assistance Equipment used: Rolling walker (2 wheels) Transfers: Sit to/from Stand Sit to Stand: Min assist  General transfer comment: min assist to stand from lowest bed height with vcs for technique and sequencing improvements    Ambulation/Gait Ambulation/Gait assistance: Min guard Gait Distance (Feet): 15 Feet Assistive device: Rolling walker (2 wheels) Gait Pattern/deviations: Step-to pattern Gait velocity: decreased  General Gait Details: Pt is has slow cadnece when ambulating in room but no LOB. Distance limited by pain. sao2 > 90 % on room air with one short oiccasion of HR spike to 130s however most in low 100s(bpm). Pt was wearing TLSO during all standing activity     Balance Overall balance assessment: Needs assistance Sitting-balance support: Feet supported, Bilateral upper extremity supported Sitting balance-Leahy Scale: Good     Standing balance support: Bilateral upper extremity supported, During functional activity, Reliant on assistive device for balance Standing balance-Leahy Scale: Fair Standing balance comment: no LOB noted with BUE support       Cognition Arousal/Alertness: Awake/alert Behavior During Therapy: WFL for tasks assessed/performed Overall Cognitive Status: Within Functional Limits for tasks assessed Area of Impairment: Orientation  Orientation Level: Time, Situation      General Comments: awake, alert and appropriate  during session           General Comments General comments (skin integrity, edema, etc.): Pt had BM in bed tha she was unaware of. author assisted with hygiene care while pt was standing EOB. RN tech in room at conclusion of session.      Pertinent Vitals/Pain Pain Assessment Pain Assessment: 0-10 Pain Score: 4  Faces Pain Scale: Hurts a little bit Breathing: normal Negative Vocalization: occasional moan/groan, low speech, negative/disapproving quality Facial Expression: smiling or inexpressive Body Language: relaxed Consolability: no need to console PAINAD Score: 1 Pain Location: back Pain Descriptors / Indicators: Grimacing, Guarding, Aching Pain Intervention(s): Limited activity within patient's tolerance, Monitored during session, Premedicated before session, Repositioned     PT Goals (current goals can now be found in the care plan section) Acute Rehab PT Goals Patient Stated Goal: return to PLOF Progress towards PT goals: Progressing toward goals    Frequency    Min 4X/week      PT Plan Current plan remains appropriate       AM-PAC PT "6 Clicks" Mobility   Outcome Measure  Help needed turning from your back to your side while in a flat bed without using bedrails?: A Little Help needed moving from lying on your back to sitting on the side of a flat bed without using bedrails?: A Little Help needed moving to and from a bed to a chair (including a wheelchair)?: A Lot Help needed standing up from a chair using your arms (e.g., wheelchair or bedside chair)?: A Lot Help needed to walk in hospital room?: A Little Help needed climbing 3-5 steps with a railing? : A Lot 6 Click Score: 15    End of Session   Activity Tolerance: Patient tolerated treatment well;Patient limited by pain Patient left: in chair;with call bell/phone within reach;with nursing/sitter in room Nurse Communication: Mobility status PT Visit Diagnosis: Difficulty in walking, not elsewhere  classified (R26.2);Other abnormalities of gait and mobility (R26.89);Muscle weakness (generalized) (M62.81);Other symptoms and signs involving the nervous system (W09.811)     Time: 9147-8295 PT Time Calculation (min) (ACUTE ONLY): 40 min  Charges:  $Gait Training: 8-22 mins $Therapeutic Activity: 23-37 mins                     Jetta Lout PTA 04/01/23, 9:08 AM

## 2023-04-02 DIAGNOSIS — I4891 Unspecified atrial fibrillation: Secondary | ICD-10-CM | POA: Diagnosis not present

## 2023-04-02 DIAGNOSIS — U071 COVID-19: Secondary | ICD-10-CM | POA: Diagnosis not present

## 2023-04-02 LAB — COMPREHENSIVE METABOLIC PANEL
ALT: 35 U/L (ref 0–44)
AST: 34 U/L (ref 15–41)
Albumin: 2.2 g/dL — ABNORMAL LOW (ref 3.5–5.0)
Alkaline Phosphatase: 105 U/L (ref 38–126)
Anion gap: 9 (ref 5–15)
BUN: 18 mg/dL (ref 8–23)
CO2: 24 mmol/L (ref 22–32)
Calcium: 8.1 mg/dL — ABNORMAL LOW (ref 8.9–10.3)
Chloride: 104 mmol/L (ref 98–111)
Creatinine, Ser: 0.86 mg/dL (ref 0.44–1.00)
GFR, Estimated: >60 mL/min (ref >60)
Glucose, Bld: 89 mg/dL (ref 70–99)
Potassium: 3.3 mmol/L — ABNORMAL LOW (ref 3.5–5.1)
Sodium: 137 mmol/L (ref 135–145)
Total Bilirubin: 0.5 mg/dL (ref 0.3–1.2)
Total Protein: 5.6 g/dL — ABNORMAL LOW (ref 6.5–8.1)

## 2023-04-02 LAB — CULTURE, BLOOD (ROUTINE X 2): Special Requests: ADEQUATE

## 2023-04-02 MED ORDER — METOPROLOL TARTRATE 25 MG PO TABS
12.5000 mg | ORAL_TABLET | Freq: Two times a day (BID) | ORAL | Status: DC
  Administered 2023-04-03 – 2023-04-05 (×5): 12.5 mg via ORAL
  Filled 2023-04-02 (×6): qty 1

## 2023-04-02 MED ORDER — GUAIFENESIN-DM 100-10 MG/5ML PO SYRP
5.0000 mL | ORAL_SOLUTION | ORAL | Status: DC | PRN
  Administered 2023-04-02 – 2023-04-04 (×3): 5 mL via ORAL
  Filled 2023-04-02 (×3): qty 10

## 2023-04-02 MED ORDER — POTASSIUM CHLORIDE 20 MEQ PO PACK
40.0000 meq | PACK | Freq: Once | ORAL | Status: AC
  Administered 2023-04-02: 40 meq via ORAL
  Filled 2023-04-02: qty 2

## 2023-04-02 MED ORDER — LEVALBUTEROL HCL 0.63 MG/3ML IN NEBU
0.6300 mg | INHALATION_SOLUTION | Freq: Four times a day (QID) | RESPIRATORY_TRACT | Status: DC | PRN
  Administered 2023-04-02: 0.63 mg via RESPIRATORY_TRACT
  Filled 2023-04-02: qty 3

## 2023-04-02 NOTE — Progress Notes (Signed)
Progress Note   Patient: Sheryl Michael ZOX:[id number] DOB: May 07, 1931 DOA: 03/17/2023     16 DOS: the patient was seen and examined on 04/02/2023     Brief Narrative / Hospital Course:  Ms. Jadeline Opalka is a 89 year old female history of atrial fibrillation, hypertension, hyperlipidemia, hypothyroid. She presents emergency department 03/17/23 from [facility] assisted living for chief concerns of back pain of unknown etiology. Question trauma as she was found on the floor. She does not think that she fell, she thought that she just laid down on the floor because her back was hurting her.  She denies any neurological symptoms currently other than pain. 05/31: MRI shows T11 and likely L1 fx are acute, and do not suggest obvious instability. Neurosurgery advised TLSO and T-spine upright xrays once in brace to evaluate alignment 06/01: repeat XR show increasing angulation, planning for surgery for spinal fusion Monday following Xarelto washout. Afib w/ intermittent RVR, IV dilt push ineffective, started dilt gtt and converted to NSR. Paused gtt , increased po dose dilt, no further events until...  06/02: Again in rapid Afib early AM, restarted dilt gtt + heparin gtt anticipating surgery.  06/03: T8-L2 posterior spinal fusion w/ T8-9 and L1-2 cement augmentation, for T11 and L1 fractures.  06/04: NSR off dilt gtt. Stable post-op, PT/OT to see.    6/12 -- temp 100.1 early AM.  Tested + Covid.  Discharge to SNF delayed.  Recurrent A-fib with RVR, HR's 110-120's at rest.  6/13 -- 15: remains in A-fib, resting rates are fair 90's to 100's.  HR spikes to 130's-140 with PT but recovers with rest.  Soft BP's limited ability to escalate rate control meds at this time.     Subjective / Interval Hx:  Patient up in recliner when seen this AM.  She reports feeling very poorly.  Feels fatigued, worn out.  Back pain is ongoing but overall controlled with meds.  Denies palpitations, CP, SOB, dizziness or  lightheadedness when working with therapy and HR spiked.     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 Patient underwent  T8-L2 posterior spinal fusion w/ T8-9 and L1-2 cement augmentation, for T11 and L1 fractures with neurosurgery on 03/21/2023 --Follow up with Dr. Myer Haff --Continue with PT OT --As needed pain control --TOC following for placement   Afib RVR-improved >> recurrent in setting of +Covid infection Chronic  paroxysmal Afib --On Xarelto at home - held until at least 14 days post-op per neurosurgery (unless life threatening need for full anticoagulation). --On Lovenox for DVT ppx for now --Continue Cardizem --Reduce metoprolol back to usual dose 12.5 (increased for few days with RVR, now appears resolved) --PRN IV metoprolol for HR's sustained > 110 bpm --6/12 -- HR's elevated again 110-120's at rest on rounds this AM.  Due to Covid infection. --6/13 -- HR's improved, today in 50's to 60s --6/14 -- HR's overall controlled but spike up to as high as 130's with PT --6/15 -- HR's 90's to low 100's at rest --6/16 --  HR's 49-59 at rest  Covid-19 infection -- pt tested positive 6/12 after elevated temp 100.1 and new cough reported.   --Airborne & contact precautions --Delays SNF placement for required isolation  --Supportive / symptomatic care as needed   Malignant neoplasm of breast --Continue anastrozole   HLD (hyperlipidemia)-stable --Continue atorvastatin   Hypothyroidism, acquired --Continue levothyroxine     Depression, Dementia-stable --Continue escitalopram, memantine    Hypokalemia - resolved Resolved with replacement --Monitor BMP, Mg levels & replace PRN    DVT prophylaxis: lovenox (plan restart Xarelto on 06/17)        Code Status: FULL CODE     TOC needs / Dispo plan: Skilled nursing facility   Barriers to discharge Pt tested +Covid 6/12.   Needs SNF after appropriate isolation for Covid.     Family Communication: Daughter [name] updated by phone 6/13  afternoon.     Physical Exam  General exam: awake alerty, no acute distress HEENT: moist mucus membranes, hearing grossly normal  Respiratory system: CTAB, no wheezes, rales or rhonchi, normal respiratory effort. Cardiovascular system: RRR, no pedal edema.   Gastrointestinal system: soft, NT, ND Central nervous system: A&O x 3. no gross focal neurologic deficits, normal speech Extremities: no edema, normal tone Skin: dry, intact, normal temperature Psychiatry: normal mood, flat affect, judgement and insight appear normal        Data Reviewed:  Notable labs --- K 3.3, Ca 8.1, albumin 2.2, AST and ALT normalized.  Tprotein 5.6    03/29/23: COVID-19 PCR positive Respiratory viral panel, Influenza A/B, RSV -- all negative Acute hepatitis panel -- negative      Vitals:   04/02/23 0000 04/02/23 0400 04/02/23 0820 04/02/23 1220  BP: (!) 107/40 (!) 128/45 (!) 141/46 108/79  Pulse: (!) 49 (!) 52 (!) 55 (!) 59  Resp: 10 20 12 18   Temp: 98 F (36.7 C) 98.1 F (36.7 C) 98 F (36.7 C) 97.7 F (36.5 C)  TempSrc: Oral Oral Oral Oral  SpO2: 96% 96% 94% 96%  Weight:      Height:       Author: Pennie Banter, DO 04/02/2023 1:26 PM  For on call review [email or web address].

## 2023-04-03 ENCOUNTER — Encounter: Payer: Self-pay | Admitting: Neurosurgery

## 2023-04-03 LAB — BASIC METABOLIC PANEL
Anion gap: 9 (ref 5–15)
BUN: 13 mg/dL (ref 8–23)
CO2: 24 mmol/L (ref 22–32)
Calcium: 8.3 mg/dL — ABNORMAL LOW (ref 8.9–10.3)
Chloride: 104 mmol/L (ref 98–111)
Creatinine, Ser: 0.74 mg/dL (ref 0.44–1.00)
GFR, Estimated: >60 mL/min (ref >60)
Glucose, Bld: 89 mg/dL (ref 70–99)
Potassium: 3.7 mmol/L (ref 3.5–5.1)
Sodium: 137 mmol/L (ref 135–145)

## 2023-04-03 LAB — CULTURE, BLOOD (ROUTINE X 2)

## 2023-04-03 MED ORDER — RIVAROXABAN 15 MG PO TABS
15.0000 mg | ORAL_TABLET | Freq: Every day | ORAL | Status: DC
  Administered 2023-04-03 – 2023-04-05 (×3): 15 mg via ORAL
  Filled 2023-04-03 (×3): qty 1

## 2023-04-03 MED ORDER — APIXABAN 2.5 MG PO TABS
2.5000 mg | ORAL_TABLET | Freq: Two times a day (BID) | ORAL | Status: DC
  Filled 2023-04-03: qty 1

## 2023-04-03 NOTE — TOC Progression Note (Addendum)
Transition of Care [facility]) - Progression Note    Patient Details  Name: Sheryl Michael MRN: [id number] Date of Birth: 06-06-1931  [facility] of [facility]) CM/SW Contact  Truddie Hidden, RN Phone Number: 04/03/2023, 10:41 AM  Clinical Narrative:    Spoke with patient's daughter, Sheryl Michael to give an date about COVID and quarantining period required by [employer]. Sheryl Michael verbalized her understanding patient would have to quarantine 5-10 days from date of diagnosis depending on if she were symptomatic. Sheryl Michael inquired about available beds at [facility].  Attempt to reach Sheryl Michael at [facility]. No answer. Left a message requesting available bed and offers for patient. RNCM requested return call to this RNCM.   Attempt to reach Sheryl Michael in admissions from [facility]. No answer. Left a message requesting a return call.   12:20pm Spoke with patient's daughter, Sheryl Michael to advised bed has not been offered from [facility]. She was advised insurance auth would have to be obtained for [employer]. She was agreeable.  12:25pm Spoke with Sheryl Michael in admissions at [facility]. [facility] does not have bed availability.   12:37pm Nav[employer] auth restarted.   3:16pm Navi auth obtained. Facility notified. Spoke with Sheryl Michael.  Sheryl Michael stated per DON patient will now required 10 full days to quarantine.  MD notified.         Expected Discharge Plan and Services                                               Social Determinants of Health (SDOH) Interventions SDOH Screenings   Food Insecurity: No Food Insecurity (03/26/2023)  Housing: Low Risk  (03/26/2023)  Transportation Needs: No Transportation Needs (03/26/2023)  Utilities: Not At Risk (03/26/2023)  Tobacco Use: Low Risk  (03/20/2023)    Readmission Risk Interventions     No data to display

## 2023-04-03 NOTE — Plan of Care (Signed)

## 2023-04-03 NOTE — Progress Notes (Signed)
Occupational Therapy Re-Evaluation Patient Details Name: Sheryl Michael MRN: [id number] DOB: 10-Feb-1931 Today's Date: 04/03/2023   History of present illness Sheryl Michael is an 89yoF who comes to [facility] 5/31, found on floor by staff at [facility]. PMH: AF, HTN, HLD, hypoTSH. Thoracic MRI revealing of acute T9, T10 fractures, unstable T11 body fracture, acute T1 fracture. Pt taken to OR with neurosurgical Dr. Myer Haff, is now s/p T8-L2 PSF with T8-9 and L1-2 cement augmentation for T11 and L1 fractures. Pt tested (+) COVID19 on 6/12.   OT comments  Ms Delafield was seen for OT re-evaluation on this date. Upon arrival to room pt reclined in bed, agreeable to tx. Pt noted to have episode of bowel incontinence, RN in to assist with clean linen change. Pt requires MIN A + RW sit<>stand x2 and bed>chair t/f. MAX A don TLSO in sitting. MAX A pericare standing. SETUP for breakfast in chair. Goals and POC updated to reflect pt progress. Discharge recommendation remains appropriate.     Recommendations for follow up therapy are one component of a multi-disciplinary discharge planning process, led by the attending physician.  Recommendations may be updated based on patient status, additional functional criteria and insurance authorization.    Assistance Recommended at Discharge Frequent or constant Supervision/Assistance  Patient can return home with the following  A little help with walking and/or transfers;A lot of help with bathing/dressing/bathroom;Assistance with cooking/housework;Assist for transportation;Help with stairs or ramp for entrance   Equipment Recommendations  None recommended by OT    Recommendations for Other Services      Precautions / Restrictions Precautions Precautions: Back;Fall Required Braces or Orthoses: Spinal Brace Spinal Brace: Thoracolumbosacral orthotic;Applied in sitting position Spinal Brace Comments: for OOB Restrictions Weight Bearing Restrictions: No        Mobility Bed Mobility Overal bed mobility: Needs Assistance Bed Mobility: Rolling, Sidelying to Sit Rolling: Min guard Sidelying to sit: Min assist            Transfers Overall transfer level: Needs assistance Equipment used: Rolling walker (2 wheels) Transfers: Sit to/from Stand Sit to Stand: Min assist                 Balance Overall balance assessment: Needs assistance Sitting-balance support: Feet supported, Bilateral upper extremity supported Sitting balance-Leahy Scale: Good     Standing balance support: Bilateral upper extremity supported, During functional activity, Reliant on assistive device for balance Standing balance-Leahy Scale: Fair                             ADL either performed or assessed with clinical judgement   ADL Overall ADL's : Needs assistance/impaired                                       General ADL Comments: MIN A + RW simulated BSC t/f. MAX A don TLSO in sitting. MAX A pericare standing      Cognition Arousal/Alertness: Awake/alert Behavior During Therapy: WFL for tasks assessed/performed Overall Cognitive Status: Within Functional Limits for tasks assessed                                                     Pertinent  Vitals/ Pain       Pain Assessment Pain Assessment: Faces Faces Pain Scale: Hurts little more Pain Location: back Pain Descriptors / Indicators: Grimacing, Guarding, Aching Pain Intervention(s): Limited activity within patient's tolerance, Repositioned   Frequency  Min 2X/week        Progress Toward Goals  OT Goals(current goals can now be found in the care plan section)  Progress towards OT goals: Progressing toward goals  Acute Rehab OT Goals Patient Stated Goal: to improve back pain OT Goal Formulation: With patient Time For Goal Achievement: 04/17/23 Potential to Achieve Goals: Good ADL Goals Pt Will Perform Upper Body Dressing: sitting;with  supervision;with set-up Pt Will Perform Lower Body Dressing: sit to/from stand;with mod assist;with adaptive equipment Pt Will Transfer to Toilet: with modified independence;ambulating;regular height toilet Pt Will Perform Toileting - Clothing Manipulation and hygiene: sitting/lateral leans;with min guard assist Additional ADL Goal #1: Pt will independently instruct family/caregivers in TLSO mgt Additional ADL Goal #2: Pt will complete log roll for bed mobility requiring MIN A and VC for sequencing.  Plan Discharge plan remains appropriate;Frequency needs to be updated    Co-evaluation                 AM-PAC OT "6 Clicks" Daily Activity     Outcome Measure   Help from another person eating meals?: A Little Help from another person taking care of personal grooming?: A Little Help from another person toileting, which includes using toliet, bedpan, or urinal?: A Lot Help from another person bathing (including washing, rinsing, drying)?: A Lot Help from another person to put on and taking off regular upper body clothing?: A Little Help from another person to put on and taking off regular lower body clothing?: A Lot 6 Click Score: 15    End of Session Equipment Utilized During Treatment: Rolling walker (2 wheels);Back brace  OT Visit Diagnosis: Other abnormalities of gait and mobility (R26.89);Muscle weakness (generalized) (M62.81);History of falling (Z91.81)   Activity Tolerance Patient tolerated treatment well   Patient Left in chair;with call bell/phone within reach;with chair alarm set   Nurse Communication Mobility status        Time: 1030-1050 OT Time Calculation (min): 20 min  Charges: OT General Charges $OT Visit: 1 Visit OT Evaluation $OT Re-eval: 1 Re-eval OT Treatments $Self Care/Home Management : 8-22 mins  Kathie Dike, M.S. OTR/L  04/03/23, 11:06 AM  ascom [phone]

## 2023-04-03 NOTE — Progress Notes (Signed)
Physical Therapy Treatment Patient Details Name: Sheryl Michael MRN: [id number] DOB: [birth year] Today's Date: 04/03/2023   History of Present Illness Sheryl Michael is an 89yoF who comes to [facility] 5/31, found on floor by staff at [facility]. PMH: AF, HTN, HLD, hypoTSH. Thoracic MRI revealing of acute T9, T10 fractures, unstable T11 body fracture, acute T1 fracture. Pt taken to OR with neurosurgical Dr. Myer Haff, is now s/p T8-L2 PSF with T8-9 and L1-2 cement augmentation for T11 and L1 fractures. Pt tested (+) COVID19 on 6/12.    PT Comments    Pt received in bed, incontinent of soft stool. Pt assisted to EOB with MinA and HOB raised. MaxA to properly donn TLSO in sitting. Sit<>stand with MinA from lower surface. Gait training to/from bathroom with RW and vc's to reduce anxiety and safely negotiate RW into bathroom due to increased impulsivity. Pt experienced another BM on toilet requiring assist for hygiene, ModA to raise from low toilet. Pt's SpO2 did drop to ~84% which quickly returned to mid 90's with vc's. HR remained at 74bpm. Will continue to progress towards set goals per POC.   Recommendations for follow up therapy are one component of a multi-disciplinary discharge planning process, led by the attending physician.  Recommendations may be updated based on patient status, additional functional criteria and insurance authorization.  Follow Up Recommendations  Can patient physically be transported by private vehicle: No    Assistance Recommended at Discharge Frequent or constant Supervision/Assistance  Patient can return home with the following Assist for transportation;A little help with walking and/or transfers;A little help with bathing/dressing/bathroom   Equipment Recommendations  Other (comment) (Defer to next level of care)    Recommendations for Other Services       Precautions / Restrictions Precautions Precautions: Back;Fall Precaution Booklet Issued: Yes  (comment) Precaution Comments:  (reviewed back precautions and continued education on proper donning of TLSO) Required Braces or Orthoses: Spinal Brace Spinal Brace: Thoracolumbosacral orthotic;Applied in sitting position Spinal Brace Comments: for OOB Restrictions Weight Bearing Restrictions: No     Mobility  Bed Mobility Overal bed mobility: Needs Assistance Bed Mobility: Rolling, Sidelying to Sit Rolling: Min guard Sidelying to sit: HOB elevated, Min assist   Sit to supine: Min assist, HOB elevated   General bed mobility comments: Increased time and vc's for log roll    Transfers Overall transfer level: Needs assistance Equipment used: Rolling walker (2 wheels) Transfers: Sit to/from Stand Sit to Stand: Min assist           General transfer comment: min assist to stand from lowest bed height with vcs for technique and sequencing improvements    Ambulation/Gait Ambulation/Gait assistance: Min guard Gait Distance (Feet):  (15x2) Assistive device: Rolling walker (2 wheels) Gait Pattern/deviations: Step-to pattern Gait velocity: decreased     General Gait Details: Pt is has slow cadnece when ambulating in room but no LOB. Distance limited by pain. sao2 > 85 % on room air with HR at 75bpm. Pt was wearing TLSO during all standing activity and had just had a BM   Stairs             Wheelchair Mobility    Modified Rankin (Stroke Patients Only)       Balance Overall balance assessment: Needs assistance Sitting-balance support: Feet supported, Bilateral upper extremity supported Sitting balance-Leahy Scale: Good     Standing balance support: Bilateral upper extremity supported, During functional activity, Reliant on assistive device for balance Standing balance-Leahy Scale:  Fair Standing balance comment: no LOB noted with BUE support                            Cognition Arousal/Alertness: Awake/alert Behavior During Therapy: WFL for tasks  assessed/performed Overall Cognitive Status: Within Functional Limits for tasks assessed                                 General Comments: awake, alert and appropriate during session        Exercises General Exercises - Lower Extremity Long Arc Quad: AROM, 10 reps, Seated, Both    General Comments General comments (skin integrity, edema, etc.):  (Repeated vc's to reduce anxiety and complete PLB technique. Pt impulsive at times)      Pertinent Vitals/Pain Pain Assessment Pain Assessment: 0-10 Pain Score: 4  Pain Location: back Pain Descriptors / Indicators: Grimacing, Guarding, Aching Pain Intervention(s): Patient requesting pain meds-RN notified, Repositioned    Home Living                          Prior Function            PT Goals (current goals can now be found in the care plan section) Acute Rehab PT Goals Patient Stated Goal: return to PLOF    Frequency    Min 4X/week      PT Plan Current plan remains appropriate    Co-evaluation              AM-PAC PT "6 Clicks" Mobility   Outcome Measure  Help needed turning from your back to your side while in a flat bed without using bedrails?: A Little Help needed moving from lying on your back to sitting on the side of a flat bed without using bedrails?: A Little Help needed moving to and from a bed to a chair (including a wheelchair)?: A Lot Help needed standing up from a chair using your arms (e.g., wheelchair or bedside chair)?: A Lot Help needed to walk in hospital room?: A Little Help needed climbing 3-5 steps with a railing? : A Lot 6 Click Score: 15    End of Session Equipment Utilized During Treatment: Gait belt Activity Tolerance: Patient tolerated treatment well;Patient limited by pain Patient left: in chair;with call bell/phone within reach;with nursing/sitter in room Nurse Communication: Mobility status PT Visit Diagnosis: Difficulty in walking, not elsewhere  classified (R26.2);Other abnormalities of gait and mobility (R26.89);Muscle weakness (generalized) (M62.81);Other symptoms and signs involving the nervous system (R29.898)     Time: 4098-1191 PT Time Calculation (min) (ACUTE ONLY): 28 min  Charges:  $Gait Training: 8-22 mins $Therapeutic Activity: 8-22 mins                    Zadie Cleverly, PTA  Jannet Askew 04/03/2023, 2:59 PM

## 2023-04-03 NOTE — Progress Notes (Signed)
Patient refused eliquis tonight stating she takes xarelto at home. Dr. Arville Care notified per protocol. Upon reviewing notes from attending Dr.j Denton Lank patient was to resume home dose of xarelto 15mg  today (14 days post-op). Verbal order with read back entered. Pharmacy consulted for the selection of correct xarelto order as there were multiple options.

## 2023-04-03 NOTE — Progress Notes (Signed)
Progress Note   Patient: Sheryl Michael LFY:[id number] DOB: 25-Sep-1931 DOA: 03/17/2023     17 DOS: the patient was seen and examined on 04/03/2023     Brief Narrative / Hospital Course:  [name] is a 89 year old female history of atrial fibrillation, hypertension, hyperlipidemia, hypothyroid. She presents emergency department 03/17/23 from [facility] assisted living for chief concerns of back pain of unknown etiology. Question trauma as she was found on the floor. She does not think that she fell, she thought that she just laid down on the floor because her back was hurting her.  She denies any neurological symptoms currently other than pain. 05/31: MRI shows T11 and likely L1 fx are acute, and do not suggest obvious instability. Neurosurgery advised TLSO and T-spine upright xrays once in brace to evaluate alignment 06/01: repeat XR show increasing angulation, planning for surgery for spinal fusion Monday following Xarelto washout. Afib w/ intermittent RVR, IV dilt push ineffective, started dilt gtt and converted to NSR. Paused gtt , increased po dose dilt, no further events until...  06/02: Again in rapid Afib early AM, restarted dilt gtt + heparin gtt anticipating surgery.  06/03: T8-L2 posterior spinal fusion w/ T8-9 and L1-2 cement augmentation, for T11 and L1 fractures.  06/04: NSR off dilt gtt. Stable post-op, PT/OT to see.    6/12 -- temp 100.1 early AM.  Tested + Covid.  Discharge to SNF delayed.  Recurrent A-fib with RVR, HR's 110-120's at rest.  6/13 -- 15: remains in A-fib, resting rates are fair 90's to 100's.  HR spikes to 130's-140 with PT but recovers with rest.  Soft BP's limited ability to escalate rate control meds at this time.     Subjective / Interval Hx:  Patient up in recliner when seen this AM.  She reports feeling very poorly.  Feels fatigued, worn out.  Back pain is ongoing but overall controlled with meds.  Denies palpitations, CP, SOB, dizziness or  lightheadedness when working with therapy and HR spiked.     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 Patient underwent  T8-L2 posterior spinal fusion w/ T8-9 and L1-2 cement augmentation, for T11 and L1 fractures with neurosurgery on 03/21/2023 --Follow up with Dr. Myer Haff --Continue with PT OT --As needed pain control --TOC following for placement   Afib RVR-improved >> recurrent in setting of +Covid infection Chronic  paroxysmal Afib --On Xarelto at home - held until at least 14 days post-op per neurosurgery (unless life threatening need for full anticoagulation). --On Lovenox for DVT ppx for now --Continue Cardizem --Reduce metoprolol back to usual dose 12.5 (increased for few days with RVR, now appears resolved) --PRN IV metoprolol for HR's sustained > 110 bpm --6/12 -- HR's elevated again 110-120's at rest on rounds this AM.  Due to Covid infection. --6/13 -- HR's improved, today in 50's to 60s --6/14 -- HR's overall controlled but spike up to as high as 130's with PT --6/15 -- HR's 90's to low 100's at rest --6/16 --  HR's 49-59 at rest HR's remain controlled Stable to transfer to med/surg floor.  Covid-19 infection -- pt tested positive 6/12 after elevated temp 100.1 and new cough reported.   --Airborne & contact precautions --Delays SNF placement for required isolation  --Supportive / symptomatic care as needed   Malignant neoplasm of breast --Continue anastrozole   HLD (hyperlipidemia)-stable --Continue  atorvastatin   Hypothyroidism, acquired --Continue levothyroxine    Depression, Dementia-stable --Continue escitalopram, memantine    Hypokalemia - resolved Resolved with replacement --Monitor BMP, Mg levels & replace PRN    DVT prophylaxis: lovenox (plan restart Xarelto on 06/17)        Code Status: FULL CODE     TOC needs / Dispo plan: Skilled nursing facility   Barriers to discharge Pt tested +Covid 6/12.   Needs SNF after appropriate isolation for Covid.     Family Communication: Daughter [name] updated by phone 6/13  afternoon.     Physical Exam  General exam: awake alerty, no acute distress HEENT: moist mucus membranes, hearing grossly normal  Respiratory system: CTAB, no wheezes, rales or rhonchi, normal respiratory effort. Cardiovascular system: RRR, no pedal edema.   Gastrointestinal system: soft, NT, ND Central nervous system: A&O x 3. no gross focal neurologic deficits, normal speech Extremities: no edema, normal tone Skin: dry, intact, normal temperature Psychiatry: normal mood, flat affect, judgement and insight appear normal        Data Reviewed:  Notable labs --- Ca 8.3, otherwise normal BMP.    03/29/23: COVID-19 PCR positive Respiratory viral panel, Influenza A/B, RSV -- all negative Acute hepatitis panel -- negative      Vitals:   04/03/23 0046 04/03/23 0349 04/03/23 0826 04/03/23 1202  BP: (!) 148/46 (!) 159/49 (!) 152/45 (!) 113/33  Pulse: (!) 56 68 63 62  Resp: 18 19 20 16   Temp: 97.9 F (36.6 C) 98.4 F (36.9 C) 98 F (36.7 C) 98 F (36.7 C)  TempSrc: Oral Oral Oral Oral  SpO2: 97% 98% 100% 95%  Weight:      Height:       Author: Pennie Banter, DO 04/03/2023 12:58 PM  For on call review [email or web address].

## 2023-04-03 NOTE — Plan of Care (Signed)
  Problem: Education: Goal: Knowledge of General Education information will improve Description: Including pain rating scale, medication(s)/side effects and non-pharmacologic comfort measures 04/03/2023 1913 by Antonieta Pert, RN Outcome: Progressing 04/03/2023 1212 by Antonieta Pert, RN Outcome: Progressing   Problem: Health Behavior/Discharge Planning: Goal: Ability to manage health-related needs will improve Outcome: Progressing   Problem: Clinical Measurements: Goal: Ability to maintain clinical measurements within normal limits will improve Outcome: Progressing Goal: Will remain free from infection Outcome: Progressing Goal: Diagnostic test results will improve Outcome: Progressing Goal: Respiratory complications will improve Outcome: Progressing

## 2023-04-03 NOTE — Plan of Care (Signed)
  Problem: Education: Goal: Knowledge of General Education information will improve Description: Including pain rating scale, medication(s)/side effects and non-pharmacologic comfort measures 04/03/2023 1913 by Antonieta Pert, RN Outcome: Progressing 04/03/2023 1913 by Antonieta Pert, RN Outcome: Progressing 04/03/2023 1212 by Antonieta Pert, RN Outcome: Progressing   Problem: Health Behavior/Discharge Planning: Goal: Ability to manage health-related needs will improve Outcome: Progressing   Problem: Clinical Measurements: Goal: Ability to maintain clinical measurements within normal limits will improve 04/03/2023 1913 by Antonieta Pert, RN Outcome: Progressing 04/03/2023 1212 by Antonieta Pert, RN Outcome: Progressing Goal: Will remain free from infection Outcome: Progressing Goal: Diagnostic test results will improve Outcome: Progressing Goal: Respiratory complications will improve Outcome: Progressing

## 2023-04-04 ENCOUNTER — Inpatient Hospital Stay: Payer: Medicare Other

## 2023-04-04 ENCOUNTER — Encounter: Payer: Medicare Other | Admitting: Neurosurgery

## 2023-04-04 LAB — D-DIMER, QUANTITATIVE: D-Dimer, Quant: 2 ug/mL-FEU — ABNORMAL HIGH (ref 0.00–0.50)

## 2023-04-04 MED ORDER — TECHNETIUM TO 99M ALBUMIN AGGREGATED
4.0000 | Freq: Once | INTRAVENOUS | Status: AC | PRN
  Administered 2023-04-04: 3.96 via INTRAVENOUS

## 2023-04-04 MED ORDER — LEVALBUTEROL TARTRATE 45 MCG/ACT IN AERO
1.0000 | INHALATION_SPRAY | Freq: Four times a day (QID) | RESPIRATORY_TRACT | Status: DC | PRN
  Filled 2023-04-04: qty 1

## 2023-04-04 NOTE — Progress Notes (Signed)
PT Cancellation Note  Patient Details Name: Sheryl Michael MRN: [id number] DOB: 1931/10/16   Cancelled Treatment:     Pt with new onset left-side rib pain and receiving work up. Will hold PT session until pt cleared for continued skilled services and mobility.     Jannet Askew 04/04/2023, 3:15 PM

## 2023-04-04 NOTE — Plan of Care (Signed)
  Problem: Education: Goal: Knowledge of General Education information will improve Description: Including pain rating scale, medication(s)/side effects and non-pharmacologic comfort measures Outcome: Progressing   Problem: Health Behavior/Discharge Planning: Goal: Ability to manage health-related needs will improve Outcome: Progressing   Problem: Clinical Measurements: Goal: Ability to maintain clinical measurements within normal limits will improve Outcome: Progressing Goal: Will remain free from infection Outcome: Progressing Goal: Diagnostic test results will improve Outcome: Progressing Goal: Respiratory complications will improve Outcome: Progressing Goal: Cardiovascular complication will be avoided Outcome: Progressing   Problem: Activity: Goal: Risk for activity intolerance will decrease Outcome: Progressing   Problem: Nutrition: Goal: Adequate nutrition will be maintained Outcome: Progressing   Problem: Coping: Goal: Level of anxiety will decrease Outcome: Progressing   Problem: Elimination: Goal: Will not experience complications related to bowel motility Outcome: Progressing Goal: Will not experience complications related to urinary retention Outcome: Progressing   Problem: Pain Managment: Goal: General experience of comfort will improve Outcome: Progressing   Problem: Safety: Goal: Ability to remain free from injury will improve Outcome: Progressing   Problem: Skin Integrity: Goal: Risk for impaired skin integrity will decrease Outcome: Progressing   Problem: Fluid Volume: Goal: Hemodynamic stability will improve Outcome: Progressing   Problem: Clinical Measurements: Goal: Diagnostic test results will improve Outcome: Progressing Goal: Signs and symptoms of infection will decrease Outcome: Progressing   Problem: Respiratory: Goal: Ability to maintain adequate ventilation will improve Outcome: Progressing   Problem: Education: Goal:  Knowledge of risk factors and measures for prevention of condition will improve Outcome: Progressing   Problem: Coping: Goal: Psychosocial and spiritual needs will be supported Outcome: Progressing   Problem: Respiratory: Goal: Will maintain a patent airway Outcome: Progressing Goal: Complications related to the disease process, condition or treatment will be avoided or minimized Outcome: Progressing   

## 2023-04-04 NOTE — Progress Notes (Addendum)
Progress Note   Patient: Sheryl Michael:[id number] DOB: [birth year] DOA: 03/17/2023     18 DOS: the patient was seen and examined on 04/04/2023     Brief Narrative / Hospital Course:  "[name] is a 89 year old female history of atrial fibrillation, hypertension, hyperlipidemia, hypothyroid. She presents emergency department 03/17/23 from [facility] assisted living for chief concerns of back pain of unknown etiology. Question trauma as she was found on the floor. She does not think that she fell, she thought that she just laid down on the floor because her back was hurting her.  She denies any neurological symptoms currently other than pain." 05/31: MRI shows T11 and likely L1 fx are acute, and do not suggest obvious instability. Neurosurgery advised TLSO and T-spine upright xrays once in brace to evaluate alignment 06/01: repeat XR show increasing angulation, planning for surgery for spinal fusion Monday following Xarelto washout. Afib w/ intermittent RVR, IV dilt push ineffective, started dilt gtt and converted to NSR. Paused gtt , increased po dose dilt, no further events until...  06/02: Again in rapid Afib early AM, restarted dilt gtt + heparin gtt anticipating surgery.  06/03: T8-L2 posterior spinal fusion w/ T8-9 and L1-2 cement augmentation, for T11 and L1 fractures.  06/04: NSR off dilt gtt. Stable post-op, PT/OT to see."    6/12 -- temp 100.1 early AM.  Tested + Covid.  Discharge to SNF delayed.  Recurrent A-fib with RVR, HR's 110-120's at rest.  6/13 -- 15: remains in A-fib, resting rates are fair 90's to 100's.  HR spikes to 130's-140 with PT but recovers with rest.  Soft BP's limited ability to escalate rate control meds at this time.  6/18 -- TOC indicated SNF able to take pt today, but having new onset left sided chest/rib cage pain requiring evaluation, outlined below.     Subjective / Interval Hx:  Patient sitting up in bed, wincing in pain.  States her left rib cage  is very painful, new upon waking this AM.  She denies injury.  Reports increased pain when taking a deep breath, taking very shallow breaths.     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 Patient underwent  T8-L2 posterior spinal fusion w/ T8-9 and L1-2 cement augmentation, for T11 and L1 fractures with neurosurgery on 03/21/2023 --Follow up with Dr. Myer Haff --Continue with PT OT --As needed pain control --TOC following for placement   Afib RVR-improved >> recurrent in setting of +Covid infection Chronic  paroxysmal Afib --On Xarelto at home - held until at least 14 days post-op per neurosurgery (unless life threatening need for full anticoagulation). --On Lovenox for DVT ppx for now --Continue Cardizem --Reduce metoprolol back to usual dose 12.5 (increased for few days with RVR, now appears resolved) --PRN IV metoprolol for HR's sustained > 110 bpm --6/12 -- HR's elevated again 110-120's at rest on rounds this AM.  Due to Covid infection. --6/13 -- HR's improved, today in 50's to 60s --6/14 -- HR's overall controlled but  spike up to as high as 130's with PT --6/15 -- HR's 90's to low 100's at rest --6/16 -- HR's 49-59 at rest HR's remain controlled Stable to transfer to med/surg floor.  Covid-19 infection -- pt tested positive 6/12 after elevated temp 100.1 and new cough reported.   --Airborne & contact precautions --Delays SNF placement for required isolation  --Supportive / symptomatic care as needed  Left-side rib cage pain  - new, reported 6/18 AM Elevated D-dimer Concern for acute PE given +Covid and was off Xarelto post back surgery.  She was maintained on DVT prophylaxis however, so acute VTE seems unlikely, but need to rule it out. Xarelto resumed on 6/17 PM --Follow x-ray results - Left ribs & chest --Follow VQ scan (allergy to CT contrast) --Follow BLE doppler U/S --Continue Xarelto   Malignant neoplasm of breast --Continue anastrozole   HLD (hyperlipidemia)-stable --Continue atorvastatin   Hypothyroidism, acquired --Continue levothyroxine    Depression, Dementia-stable --Continue escitalopram, memantine    Hypokalemia - resolved Resolved with replacement --Monitor BMP, Mg levels & replace PRN    DVT prophylaxis: On Xarelto (resumed 6/17 as per neurosurgery)        Code Status: FULL CODE     TOC needs / Dispo plan: Skilled nursing facility   Barriers to discharge Pt tested +Covid 6/12.   Needs SNF after appropriate isolation for Covid.  6/18 -- new onset left-side rib pain - work up underway as above.  D/C delayed for today. Hopeful to d/c to SNF tomorrow     Family Communication: Daughters [name] and [name] updated by phone this afternoon. They're aware of the new left rib cage pain and work up underway.     Physical Exam  General exam: awake alerty, no acute distress, frail HEENT: moist mucus membranes, hearing grossly normal  Respiratory system: CTAB but diminished due to shallow inspirations, no wheezes, rales or rhonchi Cardiovascular system: RRR, no pedal edema.   Gastrointestinal system: soft, NT, ND Central nervous system: A&O x 3. no gross focal neurologic deficits, normal speech Musculoskeleta: tenderness on palpation of lower left lateral rib cage Skin: dry, intact, normal temperature Psychiatry: depressed mood, congruent affect, judgement and insight appear normal        Data Reviewed:  Elevated D-dimer 2.00. No other new labs today.  Xray - chest &  left ribs -- pending  V/Q scan -- pending    03/29/23: COVID-19 PCR positive Respiratory viral panel, Influenza A/B, RSV -- all negative Acute hepatitis panel -- negative      Vitals:   04/03/23 2033 04/03/23 2314 04/04/23 0306 04/04/23 0700  BP: 114/81 126/67 100/60 111/77  Pulse: 66 80 70 (!) 138  Resp: 18 18 18 18   Temp: 98.2 F (36.8 C) 98.2 F (36.8 C) 98.2 F (36.8 C) 98.3 F (36.8 C)  TempSrc: Oral Oral Oral Oral  SpO2: 97% 96% 96% 94%  Weight:   54.1 kg   Height:       Author: Pennie Banter, DO 04/04/2023 1:30 PM  For on call review [email or web address].

## 2023-04-04 NOTE — TOC Progression Note (Signed)
Transition of Care [facility]) - Progression Note    Patient Details  Name: Sheryl Michael MRN: [id number] Date of Birth: 11-23-1930  Transition of Care [facility]) CM/SW Contact  Truddie Hidden, RN Phone Number: 04/04/2023, 11:18 AM  Clinical Narrative:    Spoke with Tammy from [facility].  Patient was approved by facility to come today. MD notified.         Expected Discharge Plan and Services                                               Social Determinants of Health (SDOH) Interventions SDOH Screenings   Food Insecurity: No Food Insecurity (03/26/2023)  Housing: Low Risk  (03/26/2023)  Transportation Needs: No Transportation Needs (03/26/2023)  Utilities: Not At Risk (03/26/2023)  Tobacco Use: Low Risk  (04/03/2023)    Readmission Risk Interventions     No data to display

## 2023-04-05 ENCOUNTER — Other Ambulatory Visit: Payer: Medicare Other

## 2023-04-05 DIAGNOSIS — M545 Low back pain, unspecified: Secondary | ICD-10-CM

## 2023-04-05 LAB — BASIC METABOLIC PANEL
Anion gap: 9 (ref 5–15)
BUN: 16 mg/dL (ref 8–23)
CO2: 24 mmol/L (ref 22–32)
Calcium: 8.3 mg/dL — ABNORMAL LOW (ref 8.9–10.3)
Chloride: 104 mmol/L (ref 98–111)
Creatinine, Ser: 0.89 mg/dL (ref 0.44–1.00)
GFR, Estimated: >60 mL/min (ref >60)
Glucose, Bld: 78 mg/dL (ref 70–99)
Potassium: 3.8 mmol/L (ref 3.5–5.1)
Sodium: 137 mmol/L (ref 135–145)

## 2023-04-05 LAB — CBC
HCT: 34.2 % — ABNORMAL LOW (ref 36.0–46.0)
Hemoglobin: 10.8 g/dL — ABNORMAL LOW (ref 12.0–15.0)
MCH: 30.7 pg (ref 26.0–34.0)
MCHC: 31.6 g/dL (ref 30.0–36.0)
MCV: 97.2 fL (ref 80.0–100.0)
Platelets: 293 10*3/uL (ref 150–400)
RBC: 3.52 MIL/uL — ABNORMAL LOW (ref 3.87–5.11)
RDW: 14.6 % (ref 11.5–15.5)
WBC: 8.2 10*3/uL (ref 4.0–10.5)
nRBC: 0 % (ref 0.0–0.2)

## 2023-04-05 MED ORDER — DILTIAZEM HCL ER COATED BEADS 180 MG PO CP24: 180.0000 mg | ORAL_CAPSULE | Freq: Every day | ORAL | 0 refills | Status: DC

## 2023-04-05 MED ORDER — POLYETHYLENE GLYCOL 3350 17 G PO PACK: 17.0000 g | PACK | Freq: Every day | ORAL | 0 refills | Status: DC | PRN

## 2023-04-05 MED ORDER — METOPROLOL TARTRATE 25 MG PO TABS: 12.5000 mg | ORAL_TABLET | Freq: Two times a day (BID) | ORAL | 0 refills | Status: DC

## 2023-04-05 MED ORDER — ONDANSETRON HCL 4 MG PO TABS: 4.0000 mg | ORAL_TABLET | Freq: Four times a day (QID) | ORAL | 0 refills | Status: DC | PRN

## 2023-04-05 MED ORDER — OXYCODONE HCL 10 MG PO TABS: 10.0000 mg | ORAL_TABLET | Freq: Four times a day (QID) | ORAL | 0 refills | Status: DC | PRN

## 2023-04-05 MED ORDER — DOCUSATE SODIUM 100 MG PO CAPS: 100.0000 mg | ORAL_CAPSULE | Freq: Two times a day (BID) | ORAL | 0 refills | Status: DC

## 2023-04-05 NOTE — TOC Progression Note (Signed)
Transition of Care [facility]) - Progression Note    Patient Details  Name: Sheryl Michael MRN: [id number] Date of Birth: 03/26/1931  Transition of Care [facility]) CM/SW Contact  Truddie Hidden, RN Phone Number: 04/05/2023, 2:43 PM  Clinical Narrative:     Spoke with Tammy  in [facility] at [facility] patient admission confirmed for today. Patient assigned room # 705 Nurse will call report to [phone] Nurse, and family notified spoke with her daughter, [name] Face sheet and medical necessity forms printed to the floor to be added to the EMS pack EMS arranged  Discharge summary and SNF transfer report sent in [facility].  TOC signing off.        Expected Discharge Plan and Services                                               Social Determinants of Health (SDOH) Interventions SDOH Screenings   Food Insecurity: No Food Insecurity (03/26/2023)  Housing: Low Risk  (03/26/2023)  Transportation Needs: No Transportation Needs (03/26/2023)  Utilities: Not At Risk (03/26/2023)  Tobacco Use: Low Risk  (04/03/2023)    Readmission Risk Interventions     No data to display

## 2023-04-05 NOTE — Discharge Summary (Signed)
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 *  Hospital Course: Brief Narrative / Hospital Course:  "Sheryl Michael is a 89 year old female history of atrial fibrillation, hypertension, hyperlipidemia, hypothyroid. She presents emergency department 03/17/23 from [facility] assisted living for chief concerns of back pain of unknown etiology. Question trauma as she was found on the floor.  Upon arrival MRI showed T11 and likely L1 acute fracture. Neurosurgery advised TLSO and T-spine upright xrays once in brace to evaluate alignment. On 06/01: repeat XR show increasing angulation, and therefore patient underwent T8-L2 posterior spinal fusion w/ T8-9 and L1-2 cement augmentation, for T11 and L1 fractures by neurosurgeon on 03/21/2023  Patient also had A-fib with RVR requiring Cardizem drip and cardiologist input.  Patient's rates have been controlled with adjusted dose of Cardizem and metoprolol.  Patient also tested positive to COVID-19 infection on 03/29/2023.  Have not been cleared for discharge to rehab and to follow-up with  neurosurgeon and primary care physician.     Consultants: Neurosurgery,  cardiology Procedures performed: As mentioned above Disposition: Skilled nursing facility Diet recommendation:  Cardiac diet DISCHARGE MEDICATION: Allergies as of 04/05/2023       Reactions   Bactrim [sulfamethoxazole-trimethoprim] Hives   Iodinated Contrast Media Anaphylaxis   Codeine Nausea And Vomiting   Erythromycin Ethylsuccinate Other (See Comments)   Unknown   Phenobarbital Other (See Comments)   Feeling funny, nervous   Ciprofloxacin Rash   Penicillins Rash        Medication List     STOP taking these medications    AeroChamber MV inhaler   apixaban 2.5 MG Tabs tablet Commonly known as: ELIQUIS   Calcium Carbonate-Vitamin D 600-200 MG-UNIT Tabs   cephALEXin 250 MG capsule Commonly known as: KEFLEX   metroNIDAZOLE 500 MG tablet Commonly known as: FLAGYL   nystatin cream Commonly known as: MYCOSTATIN   predniSONE 10 MG (21) Tbpk tablet Commonly known as: STERAPRED UNI-PAK 21 TAB   tobramycin-dexamethasone ophthalmic solution Commonly known as: TOBRADEX       TAKE these medications    acetaminophen 325 MG tablet Commonly known as: TYLENOL Take 650 mg by mouth every 6 (six) hours as needed (pain.).   albuterol 108 (90 Base) MCG/ACT inhaler Commonly known as: VENTOLIN HFA Inhale 2 puffs into the lungs every 6 (six) hours as needed for wheezing or shortness of breath.   anastrozole 1 MG tablet Commonly known as: ARIMIDEX Take 1 tablet (1 mg total) by mouth daily.   atorvastatin 40 MG tablet Commonly known as: LIPITOR Take by mouth.   Calcium Carb-Cholecalciferol 600-20 MG-MCG Tabs 1 tablet with a meal Orally Once a day   cyanocobalamin 1000 MCG/ML  injection Commonly known as: VITAMIN B12 Inject 1,000 mcg into the muscle every 28 (twenty-eight) days.   diltiazem 180 MG 24 hr capsule Commonly known as: CARDIZEM CD Take 1 capsule (180 mg total) by mouth daily. Start taking on: April 06, 2023 What changed:  medication strength how much to  take   docusate sodium 100 MG capsule Commonly known as: COLACE Take 1 capsule (100 mg total) by mouth 2 (two) times daily.   escitalopram 10 MG tablet Commonly known as: LEXAPRO Take 10 mg by mouth daily.   levothyroxine 50 MCG tablet Commonly known as: SYNTHROID Take 50 mcg by mouth daily before breakfast.   memantine 5 MG tablet Commonly known as: NAMENDA Take 5 mg by mouth 2 (two) times daily.   metoprolol tartrate 25 MG tablet Commonly known as: LOPRESSOR Take 0.5 tablets (12.5 mg total) by mouth 2 (two) times daily.   omeprazole 20 MG capsule Commonly known as: PRILOSEC Take 20 mg by mouth daily.   ondansetron 4 MG tablet Commonly known as: ZOFRAN Take 1 tablet (4 mg total) by mouth every 6 (six) hours as needed for nausea.   Oxycodone HCl 10 MG Tabs Take 1 tablet (10 mg total) by mouth every 6 (six) hours as needed for severe pain.   polyethylene glycol 17 g packet Commonly known as: MIRALAX / GLYCOLAX Take 17 g by mouth daily as needed for moderate constipation.   Probiotic Acidophilus Caps Take 1 capsule by mouth in the morning and at bedtime.   Rivaroxaban 15 MG Tabs tablet Commonly known as: XARELTO Take by mouth.   Vitamin D3 10 MCG (400 UNIT) tablet Take 400 Units by mouth daily.        Contact information for after-discharge care     Destination     [facility] Preferred SNF .   Service: Skilled Nursing Contact information: [address] [place] [address] [phone]                    Discharge Exam: Ceasar Mons Weights   03/29/23 0531 03/31/23 0400 04/04/23 0306  Weight: 57.5 kg 57.1 kg 54.1 kg   Constitutional:      General: She is not in acute distress.    Appearance: She is not toxic-appearing.  Cardiovascular:     Rate and Rhythm: Normal rate and regular rhythm.  Pulmonary:     Effort: Pulmonary effort is normal.  Musculoskeletal: Dressing at the back appears clean and dry in the  midline with patient was turned this morning for examination Neurological:     Mental Status: She is alert. Mental status is at baseline.  Psychiatric:        Mood and Affect: Mood normal.        Behavior: Behavior normal.     Condition at discharge: good   Discharge time spent: I spent 35 minutes discharging this patient  Signed: Loyce Dys, MD [facility] 04/05/2023

## 2023-04-10 NOTE — Anesthesia Postprocedure Evaluation (Signed)
Anesthesia Post Note  Patient: [name]  Procedure(s) Performed: POSTERIOR THORACIC FUSION 4 LEVELS APPLICATION OF INTRAOPERATIVE CT SCAN  Patient location during evaluation: PACU Anesthesia Type: General Level of consciousness: awake and alert Pain management: pain level controlled Vital Signs Assessment: post-procedure vital signs reviewed and stable Respiratory status: spontaneous breathing, nonlabored ventilation, respiratory function stable and patient connected to nasal cannula oxygen Cardiovascular status: blood pressure returned to baseline and stable Postop Assessment: no apparent nausea or vomiting Anesthetic complications: no   No notable events documented.   Last Vitals:  Vitals:   04/05/23 1213 04/05/23 1820  BP: (!) 101/41 (!) 104/43  Pulse: 82 (!) 112  Resp: 19 16  Temp: (!) 36.3 C 37.1 C  SpO2: 90% 94%    Last Pain:  Vitals:   04/05/23 1600  TempSrc:   PainSc: 0-No pain                 Lenard Simmer

## 2023-04-14 ENCOUNTER — Inpatient Hospital Stay
Admission: EM | Admit: 2023-04-14 | Discharge: 2023-04-28 | DRG: 902 | Disposition: A | Discharge status: Hospice | Payer: Medicare Other | Source: Skilled Nursing Facility | Attending: Internal Medicine | Admitting: Internal Medicine

## 2023-04-14 ENCOUNTER — Other Ambulatory Visit: Payer: Self-pay

## 2023-04-14 ENCOUNTER — Emergency Department: Payer: Medicare Other

## 2023-04-14 DIAGNOSIS — Z881 Allergy status to other antibiotic agents status: Secondary | ICD-10-CM

## 2023-04-14 DIAGNOSIS — Z923 Personal history of irradiation: Secondary | ICD-10-CM

## 2023-04-14 DIAGNOSIS — R627 Adult failure to thrive: Secondary | ICD-10-CM | POA: Diagnosis present

## 2023-04-14 DIAGNOSIS — F03918 Unspecified dementia, unspecified severity, with other behavioral disturbance: Secondary | ICD-10-CM | POA: Diagnosis present

## 2023-04-14 DIAGNOSIS — E039 Hypothyroidism, unspecified: Secondary | ICD-10-CM | POA: Diagnosis present

## 2023-04-14 DIAGNOSIS — T8131XA Disruption of external operation (surgical) wound, not elsewhere classified, initial encounter: Principal | ICD-10-CM | POA: Diagnosis present

## 2023-04-14 DIAGNOSIS — Z981 Arthrodesis status: Secondary | ICD-10-CM

## 2023-04-14 DIAGNOSIS — Z91041 Radiographic dye allergy status: Secondary | ICD-10-CM

## 2023-04-14 DIAGNOSIS — E440 Moderate protein-calorie malnutrition: Secondary | ICD-10-CM | POA: Diagnosis present

## 2023-04-14 DIAGNOSIS — Z66 Do not resuscitate: Secondary | ICD-10-CM | POA: Diagnosis present

## 2023-04-14 DIAGNOSIS — Y848 Other medical procedures as the cause of abnormal reaction of the patient, or of later complication, without mention of misadventure at the time of the procedure: Secondary | ICD-10-CM | POA: Diagnosis present

## 2023-04-14 DIAGNOSIS — I10 Essential (primary) hypertension: Secondary | ICD-10-CM | POA: Diagnosis present

## 2023-04-14 DIAGNOSIS — Z833 Family history of diabetes mellitus: Secondary | ICD-10-CM

## 2023-04-14 DIAGNOSIS — Z8673 Personal history of transient ischemic attack (TIA), and cerebral infarction without residual deficits: Secondary | ICD-10-CM

## 2023-04-14 DIAGNOSIS — Z5189 Encounter for other specified aftercare: Secondary | ICD-10-CM | POA: Diagnosis not present

## 2023-04-14 DIAGNOSIS — Z79890 Hormone replacement therapy (postmenopausal): Secondary | ICD-10-CM

## 2023-04-14 DIAGNOSIS — Z803 Family history of malignant neoplasm of breast: Secondary | ICD-10-CM

## 2023-04-14 DIAGNOSIS — I482 Chronic atrial fibrillation, unspecified: Secondary | ICD-10-CM | POA: Diagnosis present

## 2023-04-14 DIAGNOSIS — Z79899 Other long term (current) drug therapy: Secondary | ICD-10-CM

## 2023-04-14 DIAGNOSIS — Z961 Presence of intraocular lens: Secondary | ICD-10-CM | POA: Diagnosis present

## 2023-04-14 DIAGNOSIS — Z885 Allergy status to narcotic agent status: Secondary | ICD-10-CM

## 2023-04-14 DIAGNOSIS — T8130XA Disruption of wound, unspecified, initial encounter: Secondary | ICD-10-CM

## 2023-04-14 DIAGNOSIS — Z22322 Carrier or suspected carrier of Methicillin resistant Staphylococcus aureus: Secondary | ICD-10-CM

## 2023-04-14 DIAGNOSIS — M810 Age-related osteoporosis without current pathological fracture: Secondary | ICD-10-CM | POA: Diagnosis present

## 2023-04-14 DIAGNOSIS — K219 Gastro-esophageal reflux disease without esophagitis: Secondary | ICD-10-CM | POA: Diagnosis present

## 2023-04-14 DIAGNOSIS — R54 Age-related physical debility: Secondary | ICD-10-CM | POA: Diagnosis present

## 2023-04-14 DIAGNOSIS — Z79811 Long term (current) use of aromatase inhibitors: Secondary | ICD-10-CM

## 2023-04-14 DIAGNOSIS — Z6823 Body mass index (BMI) 23.0-23.9, adult: Secondary | ICD-10-CM

## 2023-04-14 DIAGNOSIS — T8141XA Infection following a procedure, superficial incisional surgical site, initial encounter: Secondary | ICD-10-CM | POA: Diagnosis present

## 2023-04-14 DIAGNOSIS — I4891 Unspecified atrial fibrillation: Secondary | ICD-10-CM | POA: Diagnosis present

## 2023-04-14 DIAGNOSIS — Z90710 Acquired absence of both cervix and uterus: Secondary | ICD-10-CM

## 2023-04-14 DIAGNOSIS — Z880 Allergy status to penicillin: Secondary | ICD-10-CM

## 2023-04-14 DIAGNOSIS — Z85828 Personal history of other malignant neoplasm of skin: Secondary | ICD-10-CM

## 2023-04-14 DIAGNOSIS — Z7901 Long term (current) use of anticoagulants: Secondary | ICD-10-CM

## 2023-04-14 DIAGNOSIS — B965 Pseudomonas (aeruginosa) (mallei) (pseudomallei) as the cause of diseases classified elsewhere: Secondary | ICD-10-CM | POA: Diagnosis present

## 2023-04-14 DIAGNOSIS — E785 Hyperlipidemia, unspecified: Secondary | ICD-10-CM | POA: Diagnosis present

## 2023-04-14 DIAGNOSIS — Z853 Personal history of malignant neoplasm of breast: Secondary | ICD-10-CM

## 2023-04-14 DIAGNOSIS — E876 Hypokalemia: Secondary | ICD-10-CM | POA: Diagnosis present

## 2023-04-14 DIAGNOSIS — Z515 Encounter for palliative care: Secondary | ICD-10-CM

## 2023-04-14 LAB — URINALYSIS, COMPLETE (UACMP) WITH MICROSCOPIC
Bilirubin Urine: NEGATIVE
Glucose, UA: NEGATIVE mg/dL
Hgb urine dipstick: NEGATIVE
Ketones, ur: NEGATIVE mg/dL
Leukocytes,Ua: NEGATIVE
Nitrite: NEGATIVE
Protein, ur: NEGATIVE mg/dL
Specific Gravity, Urine: 1.008 (ref 1.005–1.030)
pH: 6 (ref 5.0–8.0)

## 2023-04-14 LAB — COMPREHENSIVE METABOLIC PANEL
ALT: 26 U/L (ref 0–44)
AST: 33 U/L (ref 15–41)
Albumin: 2.7 g/dL — ABNORMAL LOW (ref 3.5–5.0)
Alkaline Phosphatase: 99 U/L (ref 38–126)
Anion gap: 13 (ref 5–15)
BUN: 17 mg/dL (ref 8–23)
CO2: 19 mmol/L — ABNORMAL LOW (ref 22–32)
Calcium: 8.6 mg/dL — ABNORMAL LOW (ref 8.9–10.3)
Chloride: 101 mmol/L (ref 98–111)
Creatinine, Ser: 1 mg/dL (ref 0.44–1.00)
GFR, Estimated: 54 mL/min — ABNORMAL LOW (ref >60)
Glucose, Bld: 141 mg/dL — ABNORMAL HIGH (ref 70–99)
Potassium: 4.4 mmol/L (ref 3.5–5.1)
Sodium: 133 mmol/L — ABNORMAL LOW (ref 135–145)
Total Bilirubin: 1 mg/dL (ref 0.3–1.2)
Total Protein: 6.8 g/dL (ref 6.5–8.1)

## 2023-04-14 LAB — CBC WITH DIFFERENTIAL/PLATELET
Abs Immature Granulocytes: 0.07 10*3/uL (ref 0.00–0.07)
Basophils Absolute: 0.1 10*3/uL (ref 0.0–0.1)
Basophils Relative: 1 %
Eosinophils Absolute: 0.1 10*3/uL (ref 0.0–0.5)
Eosinophils Relative: 2 %
HCT: 36.5 % (ref 36.0–46.0)
Hemoglobin: 11.6 g/dL — ABNORMAL LOW (ref 12.0–15.0)
Immature Granulocytes: 1 %
Lymphocytes Relative: 18 %
Lymphs Abs: 1.2 10*3/uL (ref 0.7–4.0)
MCH: 31 pg (ref 26.0–34.0)
MCHC: 31.8 g/dL (ref 30.0–36.0)
MCV: 97.6 fL (ref 80.0–100.0)
Monocytes Absolute: 0.6 10*3/uL (ref 0.1–1.0)
Monocytes Relative: 9 %
Neutro Abs: 4.6 10*3/uL (ref 1.7–7.7)
Neutrophils Relative %: 69 %
Platelets: 252 10*3/uL (ref 150–400)
RBC: 3.74 MIL/uL — ABNORMAL LOW (ref 3.87–5.11)
RDW: 14.8 % (ref 11.5–15.5)
WBC: 6.7 10*3/uL (ref 4.0–10.5)
nRBC: 0 % (ref 0.0–0.2)

## 2023-04-14 LAB — LACTIC ACID, PLASMA: Lactic Acid, Venous: 1.8 mmol/L (ref 0.5–1.9)

## 2023-04-14 LAB — TROPONIN I (HIGH SENSITIVITY): Troponin I (High Sensitivity): 22 ng/L — ABNORMAL HIGH (ref <18)

## 2023-04-14 LAB — PHOSPHORUS: Phosphorus: 4.5 mg/dL (ref 2.5–4.6)

## 2023-04-14 MED ORDER — CEFAZOLIN SODIUM-DEXTROSE 1-4 GM/50ML-% IV SOLN
1.0000 g | Freq: Once | INTRAVENOUS | Status: AC
  Administered 2023-04-14: 1 g via INTRAVENOUS
  Filled 2023-04-14: qty 50

## 2023-04-14 MED ORDER — DILTIAZEM HCL ER COATED BEADS 180 MG PO CP24: 180.0000 mg | ORAL_CAPSULE | Freq: Every day | ORAL | Status: DC

## 2023-04-14 MED ORDER — SODIUM CHLORIDE 0.9% FLUSH
3.0000 mL | Freq: Two times a day (BID) | INTRAVENOUS | Status: DC
  Administered 2023-04-15 – 2023-04-28 (×22): 3 mL via INTRAVENOUS

## 2023-04-14 MED ORDER — METOPROLOL TARTRATE 25 MG PO TABS: 12.5000 mg | ORAL_TABLET | Freq: Two times a day (BID) | ORAL | Status: DC

## 2023-04-14 MED ORDER — ACETAMINOPHEN 325 MG PO TABS
650.0000 mg | ORAL_TABLET | Freq: Four times a day (QID) | ORAL | Status: DC | PRN
  Administered 2023-04-15 – 2023-04-24 (×8): 650 mg via ORAL
  Filled 2023-04-14 (×8): qty 2

## 2023-04-14 MED ORDER — POLYETHYLENE GLYCOL 3350 17 G PO PACK
17.0000 g | PACK | Freq: Every day | ORAL | Status: DC | PRN
  Administered 2023-04-19 – 2023-04-24 (×2): 17 g via ORAL
  Filled 2023-04-14 (×3): qty 1

## 2023-04-14 MED ORDER — MEMANTINE HCL 5 MG PO TABS
5.0000 mg | ORAL_TABLET | Freq: Two times a day (BID) | ORAL | Status: DC
  Administered 2023-04-15 – 2023-04-28 (×26): 5 mg via ORAL
  Filled 2023-04-14 (×26): qty 1

## 2023-04-14 MED ORDER — PANTOPRAZOLE SODIUM 40 MG PO TBEC
40.0000 mg | DELAYED_RELEASE_TABLET | Freq: Every day | ORAL | Status: DC
  Administered 2023-04-15 – 2023-04-28 (×13): 40 mg via ORAL
  Filled 2023-04-14 (×14): qty 1

## 2023-04-14 MED ORDER — ANASTROZOLE 1 MG PO TABS
1.0000 mg | ORAL_TABLET | Freq: Every day | ORAL | Status: DC
  Administered 2023-04-15 – 2023-04-28 (×13): 1 mg via ORAL
  Filled 2023-04-14 (×14): qty 1

## 2023-04-14 MED ORDER — ALBUTEROL SULFATE (2.5 MG/3ML) 0.083% IN NEBU: 2.5000 mg | INHALATION_SOLUTION | Freq: Four times a day (QID) | RESPIRATORY_TRACT | Status: DC | PRN

## 2023-04-14 MED ORDER — DILTIAZEM HCL 25 MG/5ML IV SOLN
10.0000 mg | Freq: Once | INTRAVENOUS | Status: AC
  Administered 2023-04-14: 10 mg via INTRAVENOUS
  Filled 2023-04-14: qty 5

## 2023-04-14 MED ORDER — SODIUM CHLORIDE 0.9 % IV SOLN: INTRAVENOUS | Status: DC

## 2023-04-14 MED ORDER — ATORVASTATIN CALCIUM 20 MG PO TABS
40.0000 mg | ORAL_TABLET | Freq: Every day | ORAL | Status: DC
  Administered 2023-04-15 – 2023-04-28 (×13): 40 mg via ORAL
  Filled 2023-04-14 (×14): qty 2

## 2023-04-14 MED ORDER — ACETAMINOPHEN 650 MG RE SUPP: 650.0000 mg | Freq: Four times a day (QID) | RECTAL | Status: DC | PRN

## 2023-04-14 MED ORDER — ALBUTEROL SULFATE HFA 108 (90 BASE) MCG/ACT IN AERS: 2.0000 | INHALATION_SPRAY | Freq: Four times a day (QID) | RESPIRATORY_TRACT | Status: DC | PRN

## 2023-04-14 MED ORDER — LEVOTHYROXINE SODIUM 50 MCG PO TABS
50.0000 ug | ORAL_TABLET | Freq: Every day | ORAL | Status: DC
  Administered 2023-04-15 – 2023-04-28 (×13): 50 ug via ORAL
  Filled 2023-04-14 (×13): qty 1

## 2023-04-14 MED ORDER — DOCUSATE SODIUM 100 MG PO CAPS
100.0000 mg | ORAL_CAPSULE | Freq: Two times a day (BID) | ORAL | Status: DC
  Administered 2023-04-15 – 2023-04-28 (×24): 100 mg via ORAL
  Filled 2023-04-14 (×26): qty 1

## 2023-04-14 MED ORDER — MIDODRINE HCL 5 MG PO TABS
10.0000 mg | ORAL_TABLET | ORAL | Status: AC
  Administered 2023-04-15: 10 mg via ORAL
  Filled 2023-04-14: qty 2

## 2023-04-14 MED ORDER — CEFAZOLIN SODIUM-DEXTROSE 1-4 GM/50ML-% IV SOLN
1.0000 g | Freq: Three times a day (TID) | INTRAVENOUS | Status: DC
  Administered 2023-04-15: 1 g via INTRAVENOUS
  Filled 2023-04-14 (×2): qty 50

## 2023-04-14 MED ORDER — LACTATED RINGERS IV BOLUS
1000.0000 mL | Freq: Once | INTRAVENOUS | Status: AC
  Administered 2023-04-14: 1000 mL via INTRAVENOUS

## 2023-04-14 MED ORDER — VITAMIN D 25 MCG (1000 UNIT) PO TABS
500.0000 [IU] | ORAL_TABLET | Freq: Every day | ORAL | Status: DC
  Administered 2023-04-15 – 2023-04-28 (×13): 500 [IU] via ORAL
  Filled 2023-04-14 (×14): qty 1

## 2023-04-14 MED ORDER — OXYCODONE HCL 5 MG PO TABS: 10.0000 mg | ORAL_TABLET | Freq: Four times a day (QID) | ORAL | Status: DC | PRN

## 2023-04-14 MED ORDER — ESCITALOPRAM OXALATE 10 MG PO TABS
10.0000 mg | ORAL_TABLET | Freq: Every day | ORAL | Status: DC
  Administered 2023-04-15 – 2023-04-28 (×13): 10 mg via ORAL
  Filled 2023-04-14 (×14): qty 1

## 2023-04-14 NOTE — Assessment & Plan Note (Addendum)
Patient has prior chronically known atrial fibrillation.  Patient was found to have a rapid ventricular rate on presentation to ER.  A single dose of diltiazem given in the ER when intravenously has controlled the heart rate down to 90/min at this time.  Patient's blood pressure is a little softer diastolic blood pressure below 60, therefore at this time I do not plan to give more diltiazem.  And I will monitor patient's heart rate.  I will give a fluid bolus however and see how patient does.  Given that the patient may need intervention by neurosurgery I will hold off Xarelto for this evening, currently restart after surgery has done evaluation on patient. Restart aptinet rate control meds in AM based on BP.

## 2023-04-14 NOTE — H&P (Addendum)
History and Physical    Patient: Sheryl Michael ZOX:[id number] DOB: 11-17-1930 DOA: 04/14/2023 DOS: the patient was seen and examined on 04/14/2023 PCP: Marguarite Arbour, MD  Patient coming from:  [facility] [place].  Chief Complaint:  Chief Complaint  Patient presents with   Wound Check    Pt BIB EMS for wound issues to her spine, had surgery 3 weeks ago that is red swollen with purulent discharge from incision.    HPI: Sheryl Michael is a 89 y.o. female with medical history significant of T8-L2 posterior spinal fusion with T8-9 and L1-2 cement augmentation done on March 21, 2023.  Indication seems to have been increasing angulation of L1 acute fracture that was diagnosed before the procedure at approximately Mar 17 2023.  Patient was subsequently discharged on April 05, 2023.  Per report obtained, patient was noted to have some concern for infection of the wound site as above and patient is transferred to [facility] ER.  Patient is offering no complaints of fever.  Is having some back pain where the surgical wound is.  And no vomiting no diarrhea no rigors.  Patient has remained afebrile.  ER course is notable for finding of tachycardia in the context of previously known chronic atrial fibrillation.  Patient otherwise offers no complaints. Review of Systems: unable to review all systems due to the inability of the patient to answer questions.  Patient does not seem to be oriented to location or date or time.  Defers everything to record review or talking to her daughter. Past Medical History:  Diagnosis Date   Arthritis    Atrial fibrillation (HCC)    Breast cancer (HCC) 2006   right breast ca with lumpectomy and rad tx, left breast ca 2021lumpectomy and rad tx   Colon polyp    Complication of anesthesia    questions about husband who passed in 2012    Cystocele    Depression    Dysrhythmia    Family history of breast cancer    Female bladder prolapse    GERD (gastroesophageal reflux  disease)    Goiter    Hyperlipemia    Hypertension    Hypothyroidism    Osteoporosis    Personal history of radiation therapy 2006   right breast ca and left breast 2021   Pneumonia    Procidentia of uterus    Recurrent UTI    Reflux    TIA (transient ischemic attack)    Vaginal atrophy    Past Surgical History:  Procedure Laterality Date   APPENDECTOMY     APPLICATION OF INTRAOPERATIVE CT SCAN  03/20/2023   Procedure: APPLICATION OF INTRAOPERATIVE CT SCAN;  Surgeon: Venetia Night, MD;  Location: [facility] ORS;  Service: Neurosurgery;;   BREAST BIOPSY Right 2006   breast cancer   BREAST BIOPSY Left 07/17/2020   [place] bx, vision marker, IMC with lobular features   BREAST LUMPECTOMY Right 2006   positive   BREAST LUMPECTOMY Left 08/03/2020   left breast invasive lobular carcinoma, negative LNs   BREAST SURGERY Right    lumpectomy   CATARACT EXTRACTION W/ INTRAOCULAR LENS  IMPLANT, BILATERAL     COLONOSCOPY     CYSTOCELE REPAIR N/A 12/21/2015   Procedure: ANTERIOR REPAIR (CYSTOCELE);  Surgeon: Herold Harms, MD;  Location: [facility] ORS;  Service: Gynecology;  Laterality: N/A;   DILATION AND CURETTAGE OF UTERUS     ELECTROMAGNETIC NAVIGATION BROCHOSCOPY Right 12/13/2018   Procedure: ELECTROMAGNETIC NAVIGATION BRONCHOSCOPY;  Surgeon: Belia Heman,  Wallis Bamberg, MD;  Location: [facility] ORS;  Service: Cardiopulmonary;  Laterality: Right;   EYE SURGERY     PARTIAL MASTECTOMY WITH NEEDLE LOCALIZATION AND AXILLARY SENTINEL LYMPH NODE BX Left 08/03/2020   Procedure: PARTIAL MASTECTOMY WITH Radio Frequency tag AND AXILLARY SENTINEL LYMPH NODE BX;  Surgeon: Carolan Shiver, MD;  Location: [facility] ORS;  Service: General;  Laterality: Left;   VAGINAL HYSTERECTOMY Bilateral 12/21/2015   Procedure: TVH BSO;  Surgeon: Herold Harms, MD;  Location: [facility] ORS;  Service: Gynecology;  Laterality: Bilateral;   Social History:  reports that she has never smoked. She has never used smokeless tobacco. She  reports that she does not drink alcohol and does not use drugs.  Allergies  Allergen Reactions   Bactrim [Sulfamethoxazole-Trimethoprim] Hives   Iodinated Contrast Media Anaphylaxis   Codeine Nausea And Vomiting   Erythromycin Ethylsuccinate Other (See Comments)    Unknown    Phenobarbital Other (See Comments)    Feeling funny, nervous   Ciprofloxacin Rash   Penicillins Rash    Family History  Problem Relation Age of Onset   Diabetes Sister    Breast cancer Sister        late 62's   Diabetes Brother    Diabetes Sister     Prior to Admission medications   Medication Sig Start Date End Date Taking? Authorizing Provider  acetaminophen (TYLENOL) 325 MG tablet Take 650 mg by mouth every 6 (six) hours as needed (pain.).    Yes [provider]  albuterol (VENTOLIN HFA) 108 (90 Base) MCG/ACT inhaler Inhale 2 puffs into the lungs every 6 (six) hours as needed for wheezing or shortness of breath. 05/16/22  Yes Bradler, Clent Jacks, MD  anastrozole (ARIMIDEX) 1 MG tablet Take 1 tablet (1 mg total) by mouth daily. 12/21/22  Yes Earna Coder, MD  atorvastatin (LIPITOR) 40 MG tablet Take by mouth. 05/26/22 05/26/23 Yes [provider]  Calcium Carb-Cholecalciferol 600-20 MG-MCG TABS 1 tablet with a meal Orally Once a day   Yes [provider]  Cholecalciferol (VITAMIN D3) 10 MCG (400 UNIT) tablet Take 400 Units by mouth daily.   Yes [provider]  cyanocobalamin 1000 MCG tablet Take 1,000 mcg by mouth daily.   Yes [provider]  diltiazem (CARDIZEM CD) 180 MG 24 hr capsule Take 1 capsule (180 mg total) by mouth daily. 04/06/23  Yes Loyce Dys, MD  docusate sodium (COLACE) 100 MG capsule Take 1 capsule (100 mg total) by mouth 2 (two) times daily. 04/05/23  Yes Loyce Dys, MD  escitalopram (LEXAPRO) 10 MG tablet Take 10 mg by mouth daily.    Yes [provider]  Lactobacillus (PROBIOTIC ACIDOPHILUS) CAPS Take 1 capsule by mouth in the  morning and at bedtime.   Yes [provider]  levothyroxine (SYNTHROID, LEVOTHROID) 50 MCG tablet Take 50 mcg by mouth daily before breakfast.   Yes [provider]  memantine (NAMENDA) 5 MG tablet Take 5 mg by mouth 2 (two) times daily.   Yes [provider]  metoprolol tartrate (LOPRESSOR) 25 MG tablet Take 0.5 tablets (12.5 mg total) by mouth 2 (two) times daily. 04/05/23  Yes Loyce Dys, MD  omeprazole (PRILOSEC) 20 MG capsule Take 20 mg by mouth daily. 01/31/20  Yes [provider]  ondansetron (ZOFRAN) 4 MG tablet Take 1 tablet (4 mg total) by mouth every 6 (six) hours as needed for nausea. 04/05/23  Yes Loyce Dys, MD  oxyCODONE 10 MG  TABS Take 1 tablet (10 mg total) by mouth every 6 (six) hours as needed for severe pain. 04/05/23  Yes Loyce Dys, MD  polyethylene glycol (MIRALAX / GLYCOLAX) 17 g packet Take 17 g by mouth daily as needed for moderate constipation. 04/05/23  Yes Loyce Dys, MD  Rivaroxaban Carlena Hurl) 15 MG TABS tablet Take by mouth. 05/27/22  Yes [provider]    Physical Exam: Vitals:   04/14/23 1936 04/14/23 2000 04/14/23 2100 04/14/23 2228  BP:  (!) 103/58 115/64 (!) 115/46  Pulse: (!) 142 77 (!) 141 88  Resp:  18 19 20   Temp:      SpO2:  97% 99% 100%  Weight:      Height:       Neural patient prefers to sleep, no distress Respiratory; bilateral entry vesicular Cardiovascular exam S1 is normal Abdomen soft nontender Extremities without any edema no focal motor deficit Wound inspected, there is dehiscence of her surgical wound on the back, I did not express any purulence from the site.  There is some surrounding erythema however it is not spreading beyond 3 cm from the incision site.  See representative photo There is no foul smell, spreading crepitus, or skin changes.   Media Information   Document Information  Photos  Back wound  04/14/2023 22:53  Attached To:  Hospital Encounter on 04/14/23   Source Information  Nolberto Hanlon, MD  [facility]   Data Reviewed:  Labs on Admission:  Results for orders placed or performed during the hospital encounter of 04/14/23 (from the past 24 hour(s))  Lactic acid, plasma     Status: None   Collection Time: 04/14/23  7:57 PM  Result Value Ref Range   Lactic Acid, Venous 1.8 0.5 - 1.9 mmol/L  CBC with Differential     Status: Abnormal   Collection Time: 04/14/23  7:57 PM  Result Value Ref Range   WBC 6.7 4.0 - 10.5 K/uL   RBC 3.74 (L) 3.87 - 5.11 MIL/uL   Hemoglobin 11.6 (L) 12.0 - 15.0 g/dL   HCT 16.1 09.6 - 04.5 %   MCV 97.6 80.0 - 100.0 fL   MCH 31.0 26.0 - 34.0 pg   MCHC 31.8 30.0 - 36.0 g/dL   RDW 40.9 81.1 - 91.4 %   Platelets 252 150 - 400 K/uL   nRBC 0.0 0.0 - 0.2 %   Neutrophils Relative % 69 %   Neutro Abs 4.6 1.7 - 7.7 K/uL   Lymphocytes Relative 18 %   Lymphs Abs 1.2 0.7 - 4.0 K/uL   Monocytes Relative 9 %   Monocytes Absolute 0.6 0.1 - 1.0 K/uL   Eosinophils Relative 2 %   Eosinophils Absolute 0.1 0.0 - 0.5 K/uL   Basophils Relative 1 %   Basophils Absolute 0.1 0.0 - 0.1 K/uL   Immature Granulocytes 1 %   Abs Immature Granulocytes 0.07 0.00 - 0.07 K/uL  Comprehensive metabolic panel     Status: Abnormal   Collection Time: 04/14/23  7:59 PM  Result Value Ref Range   Sodium 133 (L) 135 - 145 mmol/L   Potassium 4.4 3.5 - 5.1 mmol/L   Chloride 101 98 - 111 mmol/L   CO2 19 (L) 22 - 32 mmol/L   Glucose, Bld 141 (H) 70 - 99 mg/dL   BUN 17 8 - 23 mg/dL   Creatinine, Ser 7.82 0.44 - 1.00 mg/dL   Calcium 8.6 (L) 8.9 - 10.3 mg/dL   Total Protein  6.8 6.5 - 8.1 g/dL   Albumin 2.7 (L) 3.5 - 5.0 g/dL   AST 33 15 - 41 U/L   ALT 26 0 - 44 U/L   Alkaline Phosphatase 99 38 - 126 U/L   Total Bilirubin 1.0 0.3 - 1.2 mg/dL   GFR, Estimated 54 (L) >60 mL/min   Anion gap 13 5 - 15  Urinalysis, Complete w Microscopic -Urine, Clean Catch     Status: Abnormal   Collection Time: 04/14/23  9:27 PM  Result Value Ref  Range   Color, Urine YELLOW (A) YELLOW   APPearance HAZY (A) CLEAR   Specific Gravity, Urine 1.008 1.005 - 1.030   pH 6.0 5.0 - 8.0   Glucose, UA NEGATIVE NEGATIVE mg/dL   Hgb urine dipstick NEGATIVE NEGATIVE   Bilirubin Urine NEGATIVE NEGATIVE   Ketones, ur NEGATIVE NEGATIVE mg/dL   Protein, ur NEGATIVE NEGATIVE mg/dL   Nitrite NEGATIVE NEGATIVE   Leukocytes,Ua NEGATIVE NEGATIVE   RBC / HPF 0-5 0 - 5 RBC/hpf   WBC, UA 6-10 0 - 5 WBC/hpf   Bacteria, UA RARE (A) NONE SEEN   Squamous Epithelial / HPF 0-5 0 - 5 /HPF   Mucus PRESENT    Hyaline Casts, UA PRESENT    Basic Metabolic Panel: Recent Labs  Lab 04/14/23 1959  NA 133*  K 4.4  CL 101  CO2 19*  GLUCOSE 141*  BUN 17  CREATININE 1.00  CALCIUM 8.6*   Liver Function Tests: Recent Labs  Lab 04/14/23 1959  AST 33  ALT 26  ALKPHOS 99  BILITOT 1.0  PROT 6.8  ALBUMIN 2.7*   No results for input(s): "LIPASE", "AMYLASE" in the last 168 hours. No results for input(s): "AMMONIA" in the last 168 hours. CBC: Recent Labs  Lab 04/14/23 1957  WBC 6.7  NEUTROABS 4.6  HGB 11.6*  HCT 36.5  MCV 97.6  PLT 252   Cardiac Enzymes: No results for input(s): "CKTOTAL", "CKMB", "CKMBINDEX", "TROPONINIHS" in the last 168 hours.  BNP (last 3 results) No results for input(s): "PROBNP" in the last 8760 hours. CBG: No results for input(s): "GLUCAP" in the last 168 hours.  Radiological Exams on Admission:  DG Chest Port 1 View  Result Date: 04/14/2023 CLINICAL DATA:  Possible sepsis EXAM: PORTABLE CHEST 1 VIEW COMPARISON:  04/04/2023 FINDINGS: Cardiac shadow is within normal limits. Aortic calcifications are noted. The lungs are well aerated bilaterally. Old rib fractures are noted bilaterally. Postsurgical changes in the thoracolumbar spine are seen. No acute abnormality is noted. IMPRESSION: No acute abnormality noted. Electronically Signed   By: Alcide Clever M.D.   On: 04/14/2023 20:19      Assessment and Plan: * Wound  dehiscence 3 weeks after the surgery.  At this time I do not see signs of marked granulation tissue neither do I see signs of pus or foul smell.  Although infection is the most frequent sources of wound dehiscence, wound culture has been drawn and patient has been given a single dose of cefazolin as per discussion with the neurosurgeon by the ER attending.  Another of my concerns is that this is simply failure of the wound to heal as pretty much the entirety of the length of the wound has dehisced.  Therefore I will check a Phos and check nutrition evaluation. At this time wound is dresed with non stick dressing.  Dementia with behavioral disturbance (HCC) This is prior documented in record from outside facilty. No current behavioural disturbance is noted.  Atrial fibrillation with RVR [facility]) Patient has prior chronically known atrial fibrillation.  Patient was found to have a rapid ventricular rate on presentation to ER.  A single dose of diltiazem given in the ER when intravenously has controlled the heart rate down to 90/min at this time.  Patient's blood pressure is a little softer diastolic blood pressure below 60, therefore at this time I do not plan to give more diltiazem.  And I will monitor patient's heart rate.  I will give a fluid bolus however and see how patient does.  Given that the patient may need intervention by neurosurgery I will hold off Xarelto for this evening, currently restart after surgery has done evaluation on patient. Restart aptinet rate control meds in AM based on BP.       Advance Care Planning:   Code Status: Prior full code  Consults: neurosurgery has been consulted by ER provider.  Family Communication: per patient.  Severity of Illness: The appropriate patient status for this patient is OBSERVATION. Observation status is judged to be reasonable and necessary in order to provide the required intensity of service to ensure the patient's safety. The patient's  presenting symptoms, physical exam findings, and initial radiographic and laboratory data in the context of their medical condition is felt to place them at decreased risk for further clinical deterioration. Furthermore, it is anticipated that the patient will be medically stable for discharge from the hospital within 2 midnights of admission.   Author: Nolberto Hanlon, MD 04/14/2023 11:00 PM  For on call review [email or web address].

## 2023-04-14 NOTE — ED Notes (Signed)
Pt ambulatory to bedside commode with walker

## 2023-04-14 NOTE — ED Provider Notes (Signed)
[facility] Provider Note    Event Date/Time   First MD Initiated Contact with Patient 04/14/23 1925     (approximate)   History   Wound check   HPI  Sheryl Michael is a 89 y.o. female presents to the emergency department today because of concerns for wound.  Patient had spinal surgery performed roughly 3 weeks ago.  She is now at a facility.  Apparently somebody checked her back today with concern for possible infection.  Patient herself states that she gets occasional pain but nothing significant.  She denies any recent fevers nausea or vomiting.  Patient does states she has a history of atrial fibrillation.     Physical Exam   Triage Vital Signs: ED Triage Vitals  Enc Vitals Group     BP 04/14/23 1927 95/65     Pulse Rate 04/14/23 1936 (!) 142     Resp 04/14/23 1927 16     Temp 04/14/23 1927 98.1 F (36.7 C)     Temp src --      SpO2 04/14/23 1926 97 %     Weight 04/14/23 1930 125 lb (56.7 kg)     Height 04/14/23 1930 5\' 1"  (1.549 m)     Head Circumference --      Pe[facility] Flow --      Pain Score 04/14/23 1929 0     Pain Loc --      Pain Edu? --      Excl. in GC? --     Most recent vital signs: Vitals:   04/14/23 1927 04/14/23 1936  BP: 95/65   Pulse:  (!) 142  Resp: 16   Temp: 98.1 F (36.7 C)   SpO2:     General: Awake, alert, oriented. CV:  Good peripheral perfusion. Tachycardia with irregular rhythm. Resp:  Normal effort. Lungs clear. Abd:  No distention.  Other:  Incision wound in back with slight dehiscence, some granulation tissue noted, no pus.    ED Results / Procedures / Treatments   Labs (all labs ordered are listed, but only abnormal results are displayed) Labs Reviewed  CBC WITH DIFFERENTIAL/PLATELET - Abnormal; Notable for the following components:      Result Value   RBC 3.74 (*)    Hemoglobin 11.6 (*)    All other components within normal limits  URINALYSIS, COMPLETE (UACMP) WITH MICROSCOPIC - Abnormal;  Notable for the following components:   Color, Urine YELLOW (*)    APPearance HAZY (*)    Bacteria, UA RARE (*)    All other components within normal limits  COMPREHENSIVE METABOLIC PANEL - Abnormal; Notable for the following components:   Sodium 133 (*)    CO2 19 (*)    Glucose, Bld 141 (*)    Calcium 8.6 (*)    Albumin 2.7 (*)    GFR, Estimated 54 (*)    All other components within normal limits  AEROBIC/ANAEROBIC CULTURE W GRAM STAIN (SURGICAL/DEEP WOUND)  CULTURE, BLOOD (ROUTINE X 2)  CULTURE, BLOOD (ROUTINE X 2)  LACTIC ACID, PLASMA     EKG  I, Phineas Semen, attending physician, personally viewed and interpreted this EKG  EKG Time: 1930 Rate: 140 Rhythm: atrial fibrillation Axis: normal Intervals: qtc 495 QRS: RBBB ST changes: no st elevation Impression: abnormal ekg    RADIOLOGY I independently interpreted and visualized the CXR. My interpretation: No pneumonia Radiology interpretation:  IMPRESSION:  No acute abnormality noted.      PROCEDURES:  Critical  Care performed: No   MEDICATIONS ORDERED IN ED: Medications - No data to display   IMPRESSION / MDM / ASSESSMENT AND PLAN / ED COURSE  I reviewed the triage vital signs and the nursing notes.                              Differential diagnosis includes, but is not limited to, wound infection, afib with rvr  Patient's presentation is most consistent with acute presentation with potential threat to life or bodily function.   The patient is on the cardiac monitor to evaluate for evidence of arrhythmia and/or significant heart rate changes.  Patient presented from living facility today because of concerns for possible infection to surgical wound.  On exam no significant pus.  No significant erythema.  Did discuss with Dr. Myer Haff with neurosurgery.  Did recommend starting Ancef.  In addition patient was found to be in A-fib with RVR upon arrival to the emergency department.  Does have history of  A-fib and has required diltiazem infusions in the past.  Patient was given diltiazem bolus here and did slow down the heart rate.  Discussed with Dr. Maryjean Ka with the hospitalist service who will plan on admission.      FINAL CLINICAL IMPRESSION(S) / ED DIAGNOSES   Final diagnoses:  Visit for wound check  Atrial fibrillation, unspecified type [facility])     Note:  This document was prepared using Dragon voice recognition software and may include unintentional dictation errors.    Phineas Semen, MD 04/14/23 [phone]

## 2023-04-14 NOTE — ED Notes (Signed)
MD aware of B/P/ orders placed for LR bolus

## 2023-04-14 NOTE — Assessment & Plan Note (Signed)
This is prior documented in record from outside facilty. No current behavioural disturbance is noted.

## 2023-04-14 NOTE — Assessment & Plan Note (Addendum)
3 weeks after the surgery.  At this time I do not see signs of marked granulation tissue neither do I see signs of pus or foul smell.  Although infection is the most frequent sources of wound dehiscence, wound culture has been drawn and patient has been given a single dose of cefazolin as per discussion with the neurosurgeon by the ER attending.  Another of my concerns is that this is simply failure of the wound to heal as pretty much the entirety of the length of the wound has dehisced.  Therefore I will check a Phos and check nutrition evaluation. At this time wound is dresed with non stick dressing.

## 2023-04-15 ENCOUNTER — Encounter: Payer: Self-pay | Admitting: Internal Medicine

## 2023-04-15 DIAGNOSIS — T8130XA Disruption of wound, unspecified, initial encounter: Secondary | ICD-10-CM

## 2023-04-15 LAB — CBC
HCT: 33.6 % — ABNORMAL LOW (ref 36.0–46.0)
Hemoglobin: 10.5 g/dL — ABNORMAL LOW (ref 12.0–15.0)
MCH: 30.9 pg (ref 26.0–34.0)
MCHC: 31.3 g/dL (ref 30.0–36.0)
MCV: 98.8 fL (ref 80.0–100.0)
Platelets: 255 10*3/uL (ref 150–400)
RBC: 3.4 MIL/uL — ABNORMAL LOW (ref 3.87–5.11)
RDW: 14.7 % (ref 11.5–15.5)
WBC: 5.9 10*3/uL (ref 4.0–10.5)
nRBC: 0 % (ref 0.0–0.2)

## 2023-04-15 LAB — CULTURE, BLOOD (ROUTINE X 2): Culture: NO GROWTH

## 2023-04-15 LAB — BASIC METABOLIC PANEL
Anion gap: 6 (ref 5–15)
BUN: 15 mg/dL (ref 8–23)
CO2: 24 mmol/L (ref 22–32)
Calcium: 8.6 mg/dL — ABNORMAL LOW (ref 8.9–10.3)
Chloride: 109 mmol/L (ref 98–111)
Creatinine, Ser: 0.78 mg/dL (ref 0.44–1.00)
GFR, Estimated: >60 mL/min (ref >60)
Glucose, Bld: 87 mg/dL (ref 70–99)
Potassium: 4 mmol/L (ref 3.5–5.1)
Sodium: 139 mmol/L (ref 135–145)

## 2023-04-15 LAB — RETICULOCYTES
Immature Retic Fract: 23.7 % — ABNORMAL HIGH (ref 2.3–15.9)
RBC.: 3.3 MIL/uL — ABNORMAL LOW (ref 3.87–5.11)
Retic Count, Absolute: 74.6 10*3/uL (ref 19.0–186.0)
Retic Ct Pct: 2.3 % (ref 0.4–3.1)

## 2023-04-15 LAB — IRON AND TIBC
Iron: 43 ug/dL (ref 28–170)
Saturation Ratios: 22 % (ref 10.4–31.8)
TIBC: 199 ug/dL — ABNORMAL LOW (ref 250–450)
UIBC: 156 ug/dL

## 2023-04-15 LAB — APTT: aPTT: 32 seconds (ref 24–36)

## 2023-04-15 LAB — PROTIME-INR
INR: 1.2 (ref 0.8–1.2)
Prothrombin Time: 15.8 seconds — ABNORMAL HIGH (ref 11.4–15.2)

## 2023-04-15 LAB — TROPONIN I (HIGH SENSITIVITY): Troponin I (High Sensitivity): 16 ng/L (ref <18)

## 2023-04-15 LAB — PROCALCITONIN: Procalcitonin: <0.1 ng/mL

## 2023-04-15 LAB — LACTIC ACID, PLASMA: Lactic Acid, Venous: 1.8 mmol/L (ref 0.5–1.9)

## 2023-04-15 LAB — VITAMIN B12: Vitamin B-12: 892 pg/mL (ref 180–914)

## 2023-04-15 LAB — FERRITIN: Ferritin: 358 ng/mL — ABNORMAL HIGH (ref 11–307)

## 2023-04-15 LAB — FOLATE: Folate: 13.2 ng/mL (ref >5.9)

## 2023-04-15 MED ORDER — VANCOMYCIN HCL 750 MG/150ML IV SOLN
750.0000 mg | INTRAVENOUS | Status: DC
  Administered 2023-04-17 – 2023-04-28 (×12): 750 mg via INTRAVENOUS
  Filled 2023-04-15 (×13): qty 150

## 2023-04-15 MED ORDER — VANCOMYCIN HCL 1250 MG/250ML IV SOLN
1250.0000 mg | Freq: Once | INTRAVENOUS | Status: AC
  Administered 2023-04-15: 1250 mg via INTRAVENOUS
  Filled 2023-04-15: qty 250

## 2023-04-15 MED ORDER — ENSURE ENLIVE PO LIQD
237.0000 mL | Freq: Two times a day (BID) | ORAL | Status: DC
  Administered 2023-04-15 – 2023-04-28 (×23): 237 mL via ORAL

## 2023-04-15 MED ORDER — DILTIAZEM HCL ER COATED BEADS 180 MG PO CP24
180.0000 mg | ORAL_CAPSULE | Freq: Every day | ORAL | Status: DC
  Administered 2023-04-15 – 2023-04-28 (×13): 180 mg via ORAL
  Filled 2023-04-15 (×14): qty 1

## 2023-04-15 MED ORDER — SODIUM CHLORIDE 0.9 % IV SOLN
2.0000 g | Freq: Two times a day (BID) | INTRAVENOUS | Status: DC
  Administered 2023-04-15 – 2023-04-28 (×26): 2 g via INTRAVENOUS
  Filled 2023-04-15 (×28): qty 12.5

## 2023-04-15 MED ORDER — ONDANSETRON HCL 4 MG PO TABS: 4.0000 mg | ORAL_TABLET | Freq: Four times a day (QID) | ORAL | Status: DC | PRN

## 2023-04-15 MED ORDER — OXYCODONE HCL 5 MG PO TABS
2.5000 mg | ORAL_TABLET | ORAL | Status: DC | PRN
  Administered 2023-04-17: 5 mg via ORAL
  Administered 2023-04-17: 2.5 mg via ORAL
  Administered 2023-04-18: 5 mg via ORAL
  Filled 2023-04-15 (×4): qty 1

## 2023-04-15 MED ORDER — CEFAZOLIN SODIUM-DEXTROSE 1-4 GM/50ML-% IV SOLN
1.0000 g | Freq: Three times a day (TID) | INTRAVENOUS | Status: DC
  Filled 2023-04-15: qty 50

## 2023-04-15 NOTE — Progress Notes (Signed)
PROGRESS NOTE    Sheryl Michael  ZOX:[id number] DOB: 03-Aug-1931 DOA: 04/14/2023 PCP: Marguarite Arbour, MD    Brief Narrative:  89 y.o. female with medical history significant of T8-L2 posterior spinal fusion with T8-9 and L1-2 cement augmentation done on March 21, 2023.  Indication seems to have been increasing angulation of L1 acute fracture that was diagnosed before the procedure at approximately Mar 17 2023.  Patient was subsequently discharged on April 05, 2023.   [facility].  History obtained from daughter.  Daughter details that patient has not been eating or mobilizing well.  Is on fairly high-dose narcotics.  Admitted for wound dehiscence and concern for infection  6/29: Case discussed in detail with neurosurgery Dr. Marcell Barlow.  I agree that patient may benefit from return to OR for wound closure and possible VAC placement.  Wound does not appear obviously infected however healing may be difficult without reexploration wound.  Assessment & Plan:   Principal Problem:   Wound dehiscence Active Problems:   Atrial fibrillation with RVR (HCC)   Visit for wound check   Dementia with behavioral disturbance [facility])  Surgical wound dehiscence Patient is 3 weeks post T8 L2 posterior spinal fusion.  Procedure done on 6/4.  Wound does not appear obviously infected however is dehisced Plan: Discussed with neurosurgery.  Will plan to take patient to operating room on 6/30 for wound closure and possible VAC placement.  Continue IV antibiotics and IV fluids at this time.  De-escalate pain regimen.  Focus on nutrition.  RD consultation.  Atrial fibrillation with rapid ventricular response Rate control improved.  Continue IV fluids.  Restart home diltiazem.  Xarelto on hold for OR.  Dementia with behavioral disturbances PTA Namenda and Lexapro  Hypothyroidism PTA Synthroid  Suspected malnutrition RD consultation Supplemental  shakes  Hyperlipidemia Lipitor  GERD PPI    DVT prophylaxis: Xarelto, on hold for Code Status: DNR, discussed with family Family Communication: 2 family members at bedside 6/29 Disposition Plan: Status is: Observation The patient will require care spanning > 2 midnights and should be moved to inpatient because: Wound dehiscence, concern for infection.  Will require trip to the operating room.  Plan for or 6/30   Level of care: Progressive  Consultants:  Neurosurgery  Procedures:  None  Antimicrobials: Cefepime Vancomycin    Subjective: Seen and examined.  Resting in bed.  Patient appears somewhat sleepy/lethargic.  Majority of history of skin by speaking to the family members.  Objective: Vitals:   04/15/23 0323 04/15/23 0415 04/15/23 0955 04/15/23 1000  BP:  (!) 125/51 131/64 (!) 117/54  Pulse:  70 86 71  Resp:  19 17 17   Temp: 98.2 F (36.8 C)   98.2 F (36.8 C)  TempSrc: Oral     SpO2:  98% 99% 97%  Weight:      Height:        Intake/Output Summary (Last 24 hours) at 04/15/2023 1305 Last data filed at 04/15/2023 0552 Gross per 24 hour  Intake 338.8 ml  Output --  Net 338.8 ml   Filed Weights   04/14/23 1930  Weight: 56.7 kg    Examination:  General exam: NAD.  Frail-appearing Respiratory system: Clear to auscultation. Respiratory effort normal. Cardiovascular system: S1-S2, RRR, no murmurs, no pedal edema Gastrointestinal system: Thin, soft, NT/ND, normal bowel sounds Central nervous system: Alert and oriented. No focal neurological deficits. Extremities: Symmetric 5 x 5 power. Skin: No rashes, lesions or ulcers Psychiatry: Judgement  and insight appear normal. Mood & affect appropriate.     Data Reviewed: I have personally reviewed following labs and imaging studies  CBC: Recent Labs  Lab 04/14/23 1957 04/15/23 0538  WBC 6.7 5.9  NEUTROABS 4.6  --   HGB 11.6* 10.5*  HCT 36.5 33.6*  MCV 97.6 98.8  PLT 252 255   Basic Metabolic  Panel: Recent Labs  Lab 04/14/23 1959 04/15/23 0538  NA 133* 139  K 4.4 4.0  CL 101 109  CO2 19* 24  GLUCOSE 141* 87  BUN 17 15  CREATININE 1.00 0.78  CALCIUM 8.6* 8.6*  PHOS 4.5  --    GFR: Estimated Creatinine Clearance: 36 mL/min (by C-G formula based on SCr of 0.78 mg/dL). Liver Function Tests: Recent Labs  Lab 04/14/23 1959  AST 33  ALT 26  ALKPHOS 99  BILITOT 1.0  PROT 6.8  ALBUMIN 2.7*   No results for input(s): "LIPASE", "AMYLASE" in the last 168 hours. No results for input(s): "AMMONIA" in the last 168 hours. Coagulation Profile: Recent Labs  Lab 04/15/23 0538  INR 1.2   Cardiac Enzymes: No results for input(s): "CKTOTAL", "CKMB", "CKMBINDEX", "TROPONINI" in the last 168 hours. BNP (last 3 results) No results for input(s): "PROBNP" in the last 8760 hours. HbA1C: No results for input(s): "HGBA1C" in the last 72 hours. CBG: No results for input(s): "GLUCAP" in the last 168 hours. Lipid Profile: No results for input(s): "CHOL", "HDL", "LDLCALC", "TRIG", "CHOLHDL", "LDLDIRECT" in the last 72 hours. Thyroid Function Tests: No results for input(s): "TSH", "T4TOTAL", "FREET4", "T3FREE", "THYROIDAB" in the last 72 hours. Anemia Panel: Recent Labs    04/15/23 0538  VITAMINB12 892  FOLATE 13.2  FERRITIN 358*  TIBC 199*  IRON 43  RETICCTPCT 2.3   Sepsis Labs: Recent Labs  Lab 04/14/23 1957 04/15/23 0041  LATICACIDVEN 1.8 1.8    Recent Results (from the past 240 hour(s))  Aerobic/Anaerobic Culture w Gram Stain (surgical/deep wound)     Status: None (Preliminary result)   Collection Time: 04/14/23  7:57 PM   Specimen: Incision  Result Value Ref Range Status   Specimen Description   Final    INCISION Performed at [facility], [address] [place], [place] [address]    Special Requests   Final    NONE Performed at [facility], [address], [place], [place] [address]    Gram Stain   Final    NO WBC SEEN NO  ORGANISMS SEEN Performed at [facility], [address], [place], [place] [address]    Culture PENDING  Incomplete   Report Status PENDING  Incomplete  Blood Culture (routine x 2)     Status: None (Preliminary result)   Collection Time: 04/14/23  7:57 PM   Specimen: BLOOD  Result Value Ref Range Status   Specimen Description BLOOD BLOOD LEFT HAND  Final   Special Requests   Final    BOTTLES DRAWN AEROBIC AND ANAEROBIC Blood Culture results may not be optimal due to an inadequate volume of blood received in culture bottles   Culture   Final    NO GROWTH < 12 HOURS Performed at [facility], [address] [place], [place] [address]    Report Status PENDING  Incomplete  Blood Culture (routine x 2)     Status: None (Preliminary result)   Collection Time: 04/14/23  8:01 PM   Specimen: BLOOD  Result Value Ref Range Status   Specimen Description  BLOOD BLOOD RIGHT ARM  Final   Special Requests   Final    BOTTLES DRAWN AEROBIC AND ANAEROBIC Blood Culture results may not be optimal due to an excessive volume of blood received in culture bottles   Culture   Final    NO GROWTH < 12 HOURS Performed at [facility], [address] [place], [place] [address]    Report Status PENDING  Incomplete         Radiology Studies: DG Chest Port 1 View  Result Date: 04/14/2023 CLINICAL DATA:  Possible sepsis EXAM: PORTABLE CHEST 1 VIEW COMPARISON:  04/04/2023 FINDINGS: Cardiac shadow is within normal limits. Aortic calcifications are noted. The lungs are well aerated bilaterally. Old rib fractures are noted bilaterally. Postsurgical changes in the thoracolumbar spine are seen. No acute abnormality is noted. IMPRESSION: No acute abnormality noted. Electronically Signed   By: Alcide Clever M.D.   On: 04/14/2023 20:19        Scheduled Meds:  anastrozole  1 mg Oral Daily   atorvastatin  40 mg Oral Daily   cholecalciferol  500 Units Oral Daily   diltiazem  180 mg Oral  Daily   docusate sodium  100 mg Oral BID   escitalopram  10 mg Oral Daily   feeding supplement  237 mL Oral BID BM   levothyroxine  50 mcg Oral Q0600   memantine  5 mg Oral BID   pantoprazole  40 mg Oral Daily   sodium chloride flush  3 mL Intravenous Q12H   Continuous Infusions:  sodium chloride Stopped (04/15/23 0547)   ceFEPime (MAXIPIME) IV     [START ON 04/16/2023] vancomycin       LOS: 0 days      Tresa Moore, MD [facility]   If 7PM-7AM, please contact night-coverage  04/15/2023, 1:05 PM

## 2023-04-15 NOTE — Consult Note (Addendum)
Consult requested by:  Dr. Derrill Kay  Consult requested for:  Wound dehiscence  Primary Physician:  Marguarite Arbour, MD  History of Present Illness: 04/15/2023 Sheryl Michael is here today with a chief complaint of wound dehiscence and afib with RVR.  [facility] where her dressing was not changed since discharge.  She had some drainage on her dressing and was not feeling well, prompting return to ER.  Her daughter gives most history, and reports that her mom has been given substantial pain medication and has not been eating or mobilizing well.     Review of Systems:  Unobtainable from patient  Past Medical History: Past Medical History:  Diagnosis Date   Arthritis    Atrial fibrillation (HCC)    Breast cancer (HCC) 2006   right breast ca with lumpectomy and rad tx, left breast ca 2021lumpectomy and rad tx   Colon polyp    Complication of anesthesia    questions about husband who passed in 2012    Cystocele    Depression    Dysrhythmia    Family history of breast cancer    Female bladder prolapse    GERD (gastroesophageal reflux disease)    Goiter    Hyperlipemia    Hypertension    Hypothyroidism    Osteoporosis    Personal history of radiation therapy 2006   right breast ca and left breast 2021   Pneumonia    Procidentia of uterus    Recurrent UTI    Reflux    TIA (transient ischemic attack)    Vaginal atrophy     Past Surgical History: Past Surgical History:  Procedure Laterality Date   APPENDECTOMY     APPLICATION OF INTRAOPERATIVE CT SCAN  03/20/2023   Procedure: APPLICATION OF INTRAOPERATIVE CT SCAN;  Surgeon: Venetia Night, MD;  Location: [facility];  Service: Neurosurgery;;   BREAST BIOPSY Right 2006   breast cancer   BREAST BIOPSY Left 07/17/2020   [place] bx, vision marker, IMC with lobular features   BREAST LUMPECTOMY Right 2006   positive   BREAST LUMPECTOMY Left 08/03/2020   left breast invasive lobular carcinoma, negative  LNs   BREAST SURGERY Right    lumpectomy   CATARACT EXTRACTION W/ INTRAOCULAR LENS  IMPLANT, BILATERAL     COLONOSCOPY     CYSTOCELE REPAIR N/A 12/21/2015   Procedure: ANTERIOR REPAIR (CYSTOCELE);  Surgeon: Herold Harms, MD;  Location: [facility] ORS;  Service: Gynecology;  Laterality: N/A;   DILATION AND CURETTAGE OF UTERUS     ELECTROMAGNETIC NAVIGATION BROCHOSCOPY Right 12/13/2018   Procedure: ELECTROMAGNETIC NAVIGATION BRONCHOSCOPY;  Surgeon: Erin Fulling, MD;  Location: [facility];  Service: Cardiopulmonary;  Laterality: Right;   EYE SURGERY     PARTIAL MASTECTOMY WITH NEEDLE LOCALIZATION AND AXILLARY SENTINEL LYMPH NODE BX Left 08/03/2020   Procedure: PARTIAL MASTECTOMY WITH Radio Frequency tag AND AXILLARY SENTINEL LYMPH NODE BX;  Surgeon: Carolan Shiver, MD;  Location: [facility];  Service: General;  Laterality: Left;   VAGINAL HYSTERECTOMY Bilateral 12/21/2015   Procedure: TVH BSO;  Surgeon: Herold Harms, MD;  Location: [facility];  Service: Gynecology;  Laterality: Bilateral;    Allergies: Allergies as of 04/14/2023 - Review Complete 04/14/2023  Allergen Reaction Noted   Bactrim [sulfamethoxazole-trimethoprim] Hives 12/18/2018   Iodinated contrast media Anaphylaxis 10/07/2015   Codeine Nausea And Vomiting 10/07/2015   Erythromycin ethylsuccinate Other (See Comments) 02/26/2014   Phenobarbital Other (See Comments) 10/07/2015   Ciprofloxacin Rash  09/18/2018   Penicillins Rash 07/29/2020    Medications: Current Meds  Medication Sig   acetaminophen (TYLENOL) 325 MG tablet Take 650 mg by mouth every 6 (six) hours as needed (pain.).    albuterol (VENTOLIN HFA) 108 (90 Base) MCG/ACT inhaler Inhale 2 puffs into the lungs every 6 (six) hours as needed for wheezing or shortness of breath.   anastrozole (ARIMIDEX) 1 MG tablet Take 1 tablet (1 mg total) by mouth daily.   atorvastatin (LIPITOR) 40 MG tablet Take by mouth.   Calcium Carb-Cholecalciferol 600-20 MG-MCG TABS 1  tablet with a meal Orally Once a day   Cholecalciferol (VITAMIN D3) 10 MCG (400 UNIT) tablet Take 400 Units by mouth daily.   cyanocobalamin 1000 MCG tablet Take 1,000 mcg by mouth daily.   diltiazem (CARDIZEM CD) 180 MG 24 hr capsule Take 1 capsule (180 mg total) by mouth daily.   docusate sodium (COLACE) 100 MG capsule Take 1 capsule (100 mg total) by mouth 2 (two) times daily.   escitalopram (LEXAPRO) 10 MG tablet Take 10 mg by mouth daily.    Lactobacillus (PROBIOTIC ACIDOPHILUS) CAPS Take 1 capsule by mouth in the morning and at bedtime.   levothyroxine (SYNTHROID, LEVOTHROID) 50 MCG tablet Take 50 mcg by mouth daily before breakfast.   memantine (NAMENDA) 5 MG tablet Take 5 mg by mouth 2 (two) times daily.   metoprolol tartrate (LOPRESSOR) 25 MG tablet Take 0.5 tablets (12.5 mg total) by mouth 2 (two) times daily.   omeprazole (PRILOSEC) 20 MG capsule Take 20 mg by mouth daily.   ondansetron (ZOFRAN) 4 MG tablet Take 1 tablet (4 mg total) by mouth every 6 (six) hours as needed for nausea.   oxyCODONE 10 MG TABS Take 1 tablet (10 mg total) by mouth every 6 (six) hours as needed for severe pain.   polyethylene glycol (MIRALAX / GLYCOLAX) 17 g packet Take 17 g by mouth daily as needed for moderate constipation.   Rivaroxaban (XARELTO) 15 MG TABS tablet Take by mouth.    Social History: Social History   Tobacco Use   Smoking status: Never   Smokeless tobacco: Never  Vaping Use   Vaping Use: Never used  Substance Use Topics   Alcohol use: No   Drug use: No    Family Medical History: Family History  Problem Relation Age of Onset   Diabetes Sister    Breast cancer Sister        late 57's   Diabetes Brother    Diabetes Sister     Physical Examination: Vitals:   04/15/23 0323 04/15/23 0415  BP:  (!) 125/51  Pulse:  70  Resp:  19  Temp: 98.2 F (36.8 C)   SpO2:  98%    General: Patient is in no apparent distress. Attention to examination is  appropriate.  Respiratory: Patient is breathing without any difficulty.   NEUROLOGICAL:     Easily arousable.   Strength: MAEW with at least 4/5  Incision has dehiscence that has occurred since discharge without obvious drainage.  There is scant discharge where the scabbing is present.  No odor or purulence.     Medical Decision Making  Imaging: No new imaging to review  I have personally reviewed the images and agree with the above interpretation.  CBC    Component Value Date/Time   WBC 5.9 04/15/2023 0538   RBC 3.30 (L) 04/15/2023 0538   RBC 3.40 (L) 04/15/2023 0538   HGB 10.5 (L) 04/15/2023 1610  HGB 11.9 (L) 04/15/2013 0152   HCT 33.6 (L) 04/15/2023 0538   HCT 35.7 04/15/2013 0152   PLT 255 04/15/2023 0538   PLT 163 04/15/2013 0152   MCV 98.8 04/15/2023 0538   MCV 94 04/15/2013 0152   MCH 30.9 04/15/2023 0538   MCHC 31.3 04/15/2023 0538   RDW 14.7 04/15/2023 0538   RDW 14.4 04/15/2013 0152   LYMPHSABS 1.2 04/14/2023 1957   LYMPHSABS 2.0 04/15/2013 0152   MONOABS 0.6 04/14/2023 1957   MONOABS 0.4 04/15/2013 0152   EOSABS 0.1 04/14/2023 1957   EOSABS 0.3 04/15/2013 0152   BASOSABS 0.1 04/14/2023 1957   BASOSABS 0.1 04/15/2013 0152     Assessment and Plan: Sheryl Michael is a pleasant 89 y.o. female with wound dehiscence without clear infection.  - I do not see obvious signs of infection, but I am concerned that this wound will not heal without debridement and re-closure.  We will plan for that tomorrow. - Antibiotics to help keep wound clean until surgery - Do not use brace - it is causing pressure on her wound  - I have discussed with Dr. Georgeann Oppenheim - we agree on deescalation of pain regimen and focusing on nutrition.  I discussed the planned procedure at length with the patient and her daughter, including the risks, benefits, alternatives, and indications. The risks discussed include but are not limited to bleeding, infection, need for reoperation, spinal  fluid leak, stroke, vision loss, anesthetic complication, coma, paralysis, and even death. I also described in detail that improvement was not guaranteed.  The patient's family expressed understanding of these risks, and asked that we proceed with surgery. I described the surgery in layman's terms, and gave ample opportunity for questions, which were answered to the best of my ability.   I have communicated my recommendations to the requesting physician and coordinated care to facilitate these recommendations.     Courteny Egler K. Myer Haff MD, [facility]

## 2023-04-15 NOTE — H&P (View-Only) (Signed)
  Consult requested by:  Dr. Goodman  Consult requested for:  Wound dehiscence  Primary Physician:  Sparks, Jeffrey D, MD  History of Present Illness: 04/15/2023 [name] is here today with a chief complaint of wound dehiscence and afib with RVR.  She was discharged to [facility], where her dressing was not changed since discharge.  She had some drainage on her dressing and was not feeling well, prompting return to ER.  Her daughter gives most history, and reports that her mom has been given substantial pain medication and has not been eating or mobilizing well.     Review of Systems:  Unobtainable from patient  Past Medical History: Past Medical History:  Diagnosis Date   Arthritis    Atrial fibrillation (HCC)    Breast cancer (HCC) 2006   right breast ca with lumpectomy and rad tx, left breast ca 2021lumpectomy and rad tx   Colon polyp    Complication of anesthesia    questions about husband who passed in 2012    Cystocele    Depression    Dysrhythmia    Family history of breast cancer    Female bladder prolapse    GERD (gastroesophageal reflux disease)    Goiter    Hyperlipemia    Hypertension    Hypothyroidism    Osteoporosis    Personal history of radiation therapy 2006   right breast ca and left breast 2021   Pneumonia    Procidentia of uterus    Recurrent UTI    Reflux    TIA (transient ischemic attack)    Vaginal atrophy     Past Surgical History: Past Surgical History:  Procedure Laterality Date   APPENDECTOMY     APPLICATION OF INTRAOPERATIVE CT SCAN  03/20/2023   Procedure: APPLICATION OF INTRAOPERATIVE CT SCAN;  Surgeon: Maaliyah Adolph, MD;  Location: [facility] ORS;  Service: Neurosurgery;;   BREAST BIOPSY Right 2006   breast cancer   BREAST BIOPSY Left 07/17/2020   US bx, vision marker, IMC with lobular features   BREAST LUMPECTOMY Right 2006   positive   BREAST LUMPECTOMY Left 08/03/2020   left breast invasive lobular carcinoma, negative  LNs   BREAST SURGERY Right    lumpectomy   CATARACT EXTRACTION W/ INTRAOCULAR LENS  IMPLANT, BILATERAL     COLONOSCOPY     CYSTOCELE REPAIR N/A 12/21/2015   Procedure: ANTERIOR REPAIR (CYSTOCELE);  Surgeon: Martin A Defrancesco, MD;  Location: [facility] ORS;  Service: Gynecology;  Laterality: N/A;   DILATION AND CURETTAGE OF UTERUS     ELECTROMAGNETIC NAVIGATION BROCHOSCOPY Right 12/13/2018   Procedure: ELECTROMAGNETIC NAVIGATION BRONCHOSCOPY;  Surgeon: Kasa, Kurian, MD;  Location: [facility] ORS;  Service: Cardiopulmonary;  Laterality: Right;   EYE SURGERY     PARTIAL MASTECTOMY WITH NEEDLE LOCALIZATION AND AXILLARY SENTINEL LYMPH NODE BX Left 08/03/2020   Procedure: PARTIAL MASTECTOMY WITH Radio Frequency tag AND AXILLARY SENTINEL LYMPH NODE BX;  Surgeon: Cintron-Diaz, Edgardo, MD;  Location: [facility] ORS;  Service: General;  Laterality: Left;   VAGINAL HYSTERECTOMY Bilateral 12/21/2015   Procedure: TVH BSO;  Surgeon: Martin A Defrancesco, MD;  Location: [facility] ORS;  Service: Gynecology;  Laterality: Bilateral;    Allergies: Allergies as of 04/14/2023 - Review Complete 04/14/2023  Allergen Reaction Noted   Bactrim [sulfamethoxazole-trimethoprim] Hives 12/18/2018   Iodinated contrast media Anaphylaxis 10/07/2015   Codeine Nausea And Vomiting 10/07/2015   Erythromycin ethylsuccinate Other (See Comments) 02/26/2014   Phenobarbital Other (See Comments) 10/07/2015   Ciprofloxacin Rash   09/18/2018   Penicillins Rash 07/29/2020    Medications: Current Meds  Medication Sig   acetaminophen (TYLENOL) 325 MG tablet Take 650 mg by mouth every 6 (six) hours as needed (pain.).    albuterol (VENTOLIN HFA) 108 (90 Base) MCG/ACT inhaler Inhale 2 puffs into the lungs every 6 (six) hours as needed for wheezing or shortness of breath.   anastrozole (ARIMIDEX) 1 MG tablet Take 1 tablet (1 mg total) by mouth daily.   atorvastatin (LIPITOR) 40 MG tablet Take by mouth.   Calcium Carb-Cholecalciferol 600-20 MG-MCG TABS 1  tablet with a meal Orally Once a day   Cholecalciferol (VITAMIN D3) 10 MCG (400 UNIT) tablet Take 400 Units by mouth daily.   cyanocobalamin 1000 MCG tablet Take 1,000 mcg by mouth daily.   diltiazem (CARDIZEM CD) 180 MG 24 hr capsule Take 1 capsule (180 mg total) by mouth daily.   docusate sodium (COLACE) 100 MG capsule Take 1 capsule (100 mg total) by mouth 2 (two) times daily.   escitalopram (LEXAPRO) 10 MG tablet Take 10 mg by mouth daily.    Lactobacillus (PROBIOTIC ACIDOPHILUS) CAPS Take 1 capsule by mouth in the morning and at bedtime.   levothyroxine (SYNTHROID, LEVOTHROID) 50 MCG tablet Take 50 mcg by mouth daily before breakfast.   memantine (NAMENDA) 5 MG tablet Take 5 mg by mouth 2 (two) times daily.   metoprolol tartrate (LOPRESSOR) 25 MG tablet Take 0.5 tablets (12.5 mg total) by mouth 2 (two) times daily.   omeprazole (PRILOSEC) 20 MG capsule Take 20 mg by mouth daily.   ondansetron (ZOFRAN) 4 MG tablet Take 1 tablet (4 mg total) by mouth every 6 (six) hours as needed for nausea.   oxyCODONE 10 MG TABS Take 1 tablet (10 mg total) by mouth every 6 (six) hours as needed for severe pain.   polyethylene glycol (MIRALAX / GLYCOLAX) 17 g packet Take 17 g by mouth daily as needed for moderate constipation.   Rivaroxaban (XARELTO) 15 MG TABS tablet Take by mouth.    Social History: Social History   Tobacco Use   Smoking status: Never   Smokeless tobacco: Never  Vaping Use   Vaping Use: Never used  Substance Use Topics   Alcohol use: No   Drug use: No    Family Medical History: Family History  Problem Relation Age of Onset   Diabetes Sister    Breast cancer Sister        late 40's   Diabetes Brother    Diabetes Sister     Physical Examination: Vitals:   04/15/23 0323 04/15/23 0415  BP:  (!) 125/51  Pulse:  70  Resp:  19  Temp: 98.2 F (36.8 C)   SpO2:  98%    General: Patient is in no apparent distress. Attention to examination is  appropriate.  Respiratory: Patient is breathing without any difficulty.   NEUROLOGICAL:     Easily arousable.   Strength: MAEW with at least 4/5  Incision has dehiscence that has occurred since discharge without obvious drainage.  There is scant discharge where the scabbing is present.  No odor or purulence.     Medical Decision Making  Imaging: No new imaging to review  I have personally reviewed the images and agree with the above interpretation.  CBC    Component Value Date/Time   WBC 5.9 04/15/2023 0538   RBC 3.30 (L) 04/15/2023 0538   RBC 3.40 (L) 04/15/2023 0538   HGB 10.5 (L) 04/15/2023 0538     HGB 11.9 (L) 04/15/2013 0152   HCT 33.6 (L) 04/15/2023 0538   HCT 35.7 04/15/2013 0152   PLT 255 04/15/2023 0538   PLT 163 04/15/2013 0152   MCV 98.8 04/15/2023 0538   MCV 94 04/15/2013 0152   MCH 30.9 04/15/2023 0538   MCHC 31.3 04/15/2023 0538   RDW 14.7 04/15/2023 0538   RDW 14.4 04/15/2013 0152   LYMPHSABS 1.2 04/14/2023 1957   LYMPHSABS 2.0 04/15/2013 0152   MONOABS 0.6 04/14/2023 1957   MONOABS 0.4 04/15/2013 0152   EOSABS 0.1 04/14/2023 1957   EOSABS 0.3 04/15/2013 0152   BASOSABS 0.1 04/14/2023 1957   BASOSABS 0.1 04/15/2013 0152     Assessment and Plan: Ms. Silvera is a pleasant 89 y.o. female with wound dehiscence without clear infection.  - I do not see obvious signs of infection, but I am concerned that this wound will not heal without debridement and re-closure.  We will plan for that tomorrow. - Antibiotics to help keep wound clean until surgery - Do not use brace - it is causing pressure on her wound  - I have discussed with Dr. Sreenath - we agree on deescalation of pain regimen and focusing on nutrition.  I discussed the planned procedure at length with the patient and her daughter, including the risks, benefits, alternatives, and indications. The risks discussed include but are not limited to bleeding, infection, need for reoperation, spinal  fluid leak, stroke, vision loss, anesthetic complication, coma, paralysis, and even death. I also described in detail that improvement was not guaranteed.  The patient's family expressed understanding of these risks, and asked that we proceed with surgery. I described the surgery in layman's terms, and gave ample opportunity for questions, which were answered to the best of my ability.   I have communicated my recommendations to the requesting physician and coordinated care to facilitate these recommendations.     Ivanka Kirshner K. Rileyann Florance MD, MPHS Neurosurgery  

## 2023-04-15 NOTE — Consult Note (Signed)
Pharmacy Antibiotic Note  Sheryl Michael is a 89 y.o. female admitted on 04/14/2023 with  open wound .  Pharmacy has been consulted for vancomycin dosing. T8-L2 posterior spinal fusion with T8-9 and L1-2 cement augmentation done on March 21, 2023.   Plan: Will give vancomycin 1250 mg x 1 loading dose followed by 750 mg q24H. Predicted AUC 536. Goal AUC of 400-550. Vd 0.72, IBW, Scr 0.8. Plan to obtain vancomycin level after the 4th or 5th doses.   Continue cefepime 2g q12H   Height: 5\' 1"  (154.9 cm) Weight: 56.7 kg (125 lb) IBW/kg (Calculated) : 47.8  Temp (24hrs), Avg:98.2 F (36.8 C), Min:98.1 F (36.7 C), Max:98.4 F (36.9 C)  Recent Labs  Lab 04/14/23 1957 04/14/23 1959 04/15/23 0041 04/15/23 0538  WBC 6.7  --   --  5.9  CREATININE  --  1.00  --  0.78  LATICACIDVEN 1.8  --  1.8  --     Estimated Creatinine Clearance: 36 mL/min (by C-G formula based on SCr of 0.78 mg/dL).    Allergies  Allergen Reactions   Bactrim [Sulfamethoxazole-Trimethoprim] Hives   Iodinated Contrast Media Anaphylaxis   Codeine Nausea And Vomiting   Erythromycin Ethylsuccinate Other (See Comments)    Unknown    Phenobarbital Other (See Comments)    Feeling funny, nervous   Ciprofloxacin Rash   Penicillins Rash    Antimicrobials this admission: 6/29 cefepime >>  6/29 vancomycin >>   Dose adjustments this admission: None  Microbiology results: 6/28 BCx: pending 6/28 Wound Cx: pending    Thank you for allowing pharmacy to be a part of this patient's care.  Ronnald Ramp, PharmD, BCPS 04/15/2023 9:31 AM

## 2023-04-15 NOTE — ED Notes (Signed)
Advised nurse that patient has ready bed 

## 2023-04-15 NOTE — ED Notes (Signed)
Brief changed/ pericare performed

## 2023-04-15 NOTE — TOC Initial Note (Signed)
Transition of Care [facility]) - Initial/Assessment Note    Patient Details  Name: Sheryl Michael MRN: [id number] Date of Birth: Feb 07, 1931  [facility]) CM/SW Contact:    Carmina Miller, LCSWA Phone Number: 04/15/2023, 3:30 PM  Clinical Narrative:                  Spoke with pt's daughter [name] in reference to consult for new SNF (LTC), advised the family would need to work with the facility pt is at to transfer her to another SNF, [name] verbalized understanding. TOC will continue to follow for dc needs.         Patient Goals and CMS Choice            Expected Discharge Plan and Services                                              Prior Living Arrangements/Services                       Activities of Daily Living      Permission Sought/Granted                  Emotional Assessment              Admission diagnosis:  Wound dehiscence [T81.30XA] Visit for wound check [Z51.89] Atrial fibrillation, unspecified type [facility]) [I48.91] Patient Active Problem List   Diagnosis Date Noted   Wound dehiscence 04/14/2023   Visit for wound check 04/14/2023   Dementia with behavioral disturbance (HCC) 04/14/2023   COVID-19 virus infection 03/30/2023   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   Kyphosis 03/19/2023   Closed tricolumnar fracture of thoracic vertebra (HCC) 03/19/2023   Thoracic spine fracture (HCC) 03/18/2023   Inadequate pain control 03/17/2023   Closed T11 fracture (HCC) 03/17/2023   Depression 03/17/2023   Genetic testing 05/04/2021   History of breast cancer 08/27/2020   Family history of breast cancer    Carcinoma of lower-outer quadrant of left breast in female, estrogen receptor positive (HCC) 07/23/2020   Recurrent UTI 05/05/2020   Acute pyelonephritis 05/05/2020   Acute kidney injury superimposed on chronic kidney disease (HCC) 05/05/2020   History of recurrent UTIs 03/07/2020   Sepsis (HCC) 03/05/2020   Strep  throat 12/19/2018   Allergic reaction 12/19/2018   Sprain of interphalangeal joint of right ring finger 07/30/2018   Traumatic complete tear of left rotator cuff 07/16/2018   Strain of left hip 07/16/2018   Encounter for Hemoccult screening 03/12/2018   Hypothyroidism, acquired 07/10/2017   Arrhythmia 05/31/2017   Atrial fibrillation with RVR (HCC) 05/30/2017   Dyspnea 05/30/2017   Rotator cuff tendinitis, right 12/05/2016   Shingles 12/29/2015   S/P VH (vaginal hysterectomy) 12/22/2015   Cystocele 12/21/2015   Colon polyp 10/07/2015   Bladder cystocele 10/07/2015   Lower esophageal ring 10/07/2015   H/O neoplasm 08/17/2015   History of nonmelanoma skin cancer 08/17/2015   Malignant neoplasm of breast (HCC) 04/01/2014   GERD (gastroesophageal reflux disease) 04/01/2014   HLD (hyperlipidemia) 04/01/2014   OP (osteoporosis) 04/01/2014   Temporary cerebral vascular dysfunction 04/01/2014   H/O gastrointestinal disease 02/28/2014   PCP:  Marguarite Arbour, MD Pharmacy:   [facility] - [place], [place] - [address] [place] [address] Phone: [phone]  Fax: [phone]  [id number] - [place], [place] - [address] AT [facility] [address] [place] [address] Phone: [phone] Fax: [phone]  [id number] - [place] [facility] - [address] [place] [address] Phone: [phone] Fax: [phone]     Social Determinants of Health (SDOH) Social History: SDOH Screenings   Food Insecurity: No Food Insecurity (03/26/2023)  Housing: Low Risk  (03/26/2023)  Transportation Needs: No Transportation Needs (03/26/2023)  Utilities: Not At Risk (03/26/2023)  Tobacco Use: Low Risk  (04/03/2023)   SDOH Interventions:     Readmission Risk Interventions     No data to display

## 2023-04-15 NOTE — Progress Notes (Signed)
PHARMACY NOTE:  ANTIMICROBIAL RENAL DOSAGE ADJUSTMENT  Current antimicrobial regimen includes a mismatch between antimicrobial dosage and estimated renal function.  As per policy approved by the Pharmacy & Therapeutics and Medical Executive Committees, the antimicrobial dosage will be adjusted accordingly.  Current antimicrobial dosage:  Cefepime 1gm IV q8h  Indication: Wound infection  Renal Function:  Estimated Creatinine Clearance: 36 mL/min (by C-G formula based on SCr of 0.78 mg/dL).  Antimicrobial dosage has been changed to:  Cefepime 2mg  q12h  Thank you for allowing pharmacy to be a part of this patient's care.  Caryl Asp, PharmD Clinical Pharmacist 04/15/2023 9:20 AM

## 2023-04-15 NOTE — Plan of Care (Signed)
  Problem: Fluid Volume: Goal: Hemodynamic stability will improve Outcome: Progressing   Problem: Clinical Measurements: Goal: Diagnostic test results will improve Outcome: Progressing Goal: Signs and symptoms of infection will decrease Outcome: Progressing   Problem: Respiratory: Goal: Ability to maintain adequate ventilation will improve Outcome: Progressing   Problem: Education: Goal: Knowledge of General Education information will improve Description: Including pain rating scale, medication(s)/side effects and non-pharmacologic comfort measures Outcome: Progressing   Problem: Health Behavior/Discharge Planning: Goal: Ability to manage health-related needs will improve Outcome: Progressing   Problem: Clinical Measurements: Goal: Ability to maintain clinical measurements within normal limits will improve Outcome: Progressing Goal: Will remain free from infection Outcome: Progressing Goal: Diagnostic test results will improve Outcome: Progressing Goal: Respiratory complications will improve Outcome: Progressing Goal: Cardiovascular complication will be avoided Outcome: Progressing   Problem: Activity: Goal: Risk for activity intolerance will decrease Outcome: Progressing   Problem: Nutrition: Goal: Adequate nutrition will be maintained Outcome: Progressing   Problem: Coping: Goal: Level of anxiety will decrease Outcome: Progressing   Problem: Elimination: Goal: Will not experience complications related to bowel motility Outcome: Progressing Goal: Will not experience complications related to urinary retention Outcome: Progressing   Problem: Pain Managment: Goal: General experience of comfort will improve Outcome: Progressing   Problem: Safety: Goal: Ability to remain free from injury will improve Outcome: Progressing   Problem: Skin Integrity: Goal: Risk for impaired skin integrity will decrease Outcome: Progressing   Problem: Fluid Volume: Goal:  Hemodynamic stability will improve Outcome: Progressing   Problem: Clinical Measurements: Goal: Diagnostic test results will improve Outcome: Progressing Goal: Signs and symptoms of infection will decrease Outcome: Progressing   Problem: Respiratory: Goal: Ability to maintain adequate ventilation will improve Outcome: Progressing   Problem: Education: Goal: Knowledge of General Education information will improve Description: Including pain rating scale, medication(s)/side effects and non-pharmacologic comfort measures Outcome: Progressing   Problem: Health Behavior/Discharge Planning: Goal: Ability to manage health-related needs will improve Outcome: Progressing   Problem: Clinical Measurements: Goal: Ability to maintain clinical measurements within normal limits will improve Outcome: Progressing Goal: Will remain free from infection Outcome: Progressing Goal: Diagnostic test results will improve Outcome: Progressing Goal: Respiratory complications will improve Outcome: Progressing Goal: Cardiovascular complication will be avoided Outcome: Progressing   Problem: Activity: Goal: Risk for activity intolerance will decrease Outcome: Progressing   Problem: Nutrition: Goal: Adequate nutrition will be maintained Outcome: Progressing   Problem: Coping: Goal: Level of anxiety will decrease Outcome: Progressing   Problem: Elimination: Goal: Will not experience complications related to bowel motility Outcome: Progressing Goal: Will not experience complications related to urinary retention Outcome: Progressing   Problem: Pain Managment: Goal: General experience of comfort will improve Outcome: Progressing   Problem: Safety: Goal: Ability to remain free from injury will improve Outcome: Progressing   Problem: Skin Integrity: Goal: Risk for impaired skin integrity will decrease Outcome: Progressing

## 2023-04-16 ENCOUNTER — Encounter
Admission: EM | Disposition: A | Discharge status: Hospice | Payer: Self-pay | Source: Skilled Nursing Facility | Attending: Internal Medicine

## 2023-04-16 ENCOUNTER — Encounter: Payer: Self-pay | Admitting: Internal Medicine

## 2023-04-16 ENCOUNTER — Observation Stay: Payer: Medicare Other | Admitting: Registered Nurse

## 2023-04-16 ENCOUNTER — Other Ambulatory Visit: Payer: Self-pay

## 2023-04-16 DIAGNOSIS — Z6823 Body mass index (BMI) 23.0-23.9, adult: Secondary | ICD-10-CM | POA: Diagnosis not present

## 2023-04-16 DIAGNOSIS — F03918 Unspecified dementia, unspecified severity, with other behavioral disturbance: Secondary | ICD-10-CM | POA: Diagnosis present

## 2023-04-16 DIAGNOSIS — Z881 Allergy status to other antibiotic agents status: Secondary | ICD-10-CM | POA: Diagnosis not present

## 2023-04-16 DIAGNOSIS — M810 Age-related osteoporosis without current pathological fracture: Secondary | ICD-10-CM | POA: Diagnosis present

## 2023-04-16 DIAGNOSIS — Z880 Allergy status to penicillin: Secondary | ICD-10-CM | POA: Diagnosis not present

## 2023-04-16 DIAGNOSIS — Y848 Other medical procedures as the cause of abnormal reaction of the patient, or of later complication, without mention of misadventure at the time of the procedure: Secondary | ICD-10-CM | POA: Diagnosis present

## 2023-04-16 DIAGNOSIS — E876 Hypokalemia: Secondary | ICD-10-CM | POA: Diagnosis present

## 2023-04-16 DIAGNOSIS — T8130XA Disruption of wound, unspecified, initial encounter: Secondary | ICD-10-CM | POA: Diagnosis not present

## 2023-04-16 DIAGNOSIS — R627 Adult failure to thrive: Secondary | ICD-10-CM | POA: Diagnosis present

## 2023-04-16 DIAGNOSIS — K219 Gastro-esophageal reflux disease without esophagitis: Secondary | ICD-10-CM | POA: Diagnosis present

## 2023-04-16 DIAGNOSIS — I4891 Unspecified atrial fibrillation: Secondary | ICD-10-CM | POA: Diagnosis not present

## 2023-04-16 DIAGNOSIS — Z853 Personal history of malignant neoplasm of breast: Secondary | ICD-10-CM | POA: Diagnosis not present

## 2023-04-16 DIAGNOSIS — R54 Age-related physical debility: Secondary | ICD-10-CM | POA: Diagnosis present

## 2023-04-16 DIAGNOSIS — T8141XA Infection following a procedure, superficial incisional surgical site, initial encounter: Secondary | ICD-10-CM | POA: Diagnosis present

## 2023-04-16 DIAGNOSIS — Z66 Do not resuscitate: Secondary | ICD-10-CM | POA: Diagnosis present

## 2023-04-16 DIAGNOSIS — Z79811 Long term (current) use of aromatase inhibitors: Secondary | ICD-10-CM | POA: Diagnosis not present

## 2023-04-16 DIAGNOSIS — E039 Hypothyroidism, unspecified: Secondary | ICD-10-CM | POA: Diagnosis present

## 2023-04-16 DIAGNOSIS — E440 Moderate protein-calorie malnutrition: Secondary | ICD-10-CM | POA: Diagnosis present

## 2023-04-16 DIAGNOSIS — I482 Chronic atrial fibrillation, unspecified: Secondary | ICD-10-CM | POA: Diagnosis present

## 2023-04-16 DIAGNOSIS — Z7189 Other specified counseling: Secondary | ICD-10-CM | POA: Diagnosis not present

## 2023-04-16 DIAGNOSIS — Z79890 Hormone replacement therapy (postmenopausal): Secondary | ICD-10-CM | POA: Diagnosis not present

## 2023-04-16 DIAGNOSIS — Z981 Arthrodesis status: Secondary | ICD-10-CM | POA: Diagnosis not present

## 2023-04-16 DIAGNOSIS — D649 Anemia, unspecified: Secondary | ICD-10-CM | POA: Diagnosis not present

## 2023-04-16 DIAGNOSIS — Z923 Personal history of irradiation: Secondary | ICD-10-CM | POA: Diagnosis not present

## 2023-04-16 DIAGNOSIS — Z515 Encounter for palliative care: Secondary | ICD-10-CM | POA: Diagnosis not present

## 2023-04-16 DIAGNOSIS — Z7901 Long term (current) use of anticoagulants: Secondary | ICD-10-CM | POA: Diagnosis not present

## 2023-04-16 DIAGNOSIS — I10 Essential (primary) hypertension: Secondary | ICD-10-CM | POA: Diagnosis present

## 2023-04-16 DIAGNOSIS — T8131XA Disruption of external operation (surgical) wound, not elsewhere classified, initial encounter: Secondary | ICD-10-CM | POA: Diagnosis present

## 2023-04-16 DIAGNOSIS — E785 Hyperlipidemia, unspecified: Secondary | ICD-10-CM | POA: Diagnosis present

## 2023-04-16 HISTORY — PX: LUMBAR WOUND DEBRIDEMENT: SHX1988

## 2023-04-16 HISTORY — PX: MINOR APPLICATION OF WOUND VAC: SHX6243

## 2023-04-16 LAB — CULTURE, BLOOD (ROUTINE X 2)

## 2023-04-16 SURGERY — LUMBAR WOUND DEBRIDEMENT
Anesthesia: General | Site: Back

## 2023-04-16 MED ORDER — MIDAZOLAM HCL 2 MG/2ML IJ SOLN
INTRAMUSCULAR | Status: AC
  Filled 2023-04-16: qty 2

## 2023-04-16 MED ORDER — FENTANYL CITRATE (PF) 100 MCG/2ML IJ SOLN
INTRAMUSCULAR | Status: AC
  Filled 2023-04-16: qty 2

## 2023-04-16 MED ORDER — EPHEDRINE 5 MG/ML INJ
INTRAVENOUS | Status: AC
  Filled 2023-04-16: qty 5

## 2023-04-16 MED ORDER — ROCURONIUM BROMIDE 100 MG/10ML IV SOLN
INTRAVENOUS | Status: DC | PRN
  Administered 2023-04-16: 40 mg via INTRAVENOUS

## 2023-04-16 MED ORDER — SODIUM CHLORIDE 0.9 % IR SOLN
Status: DC | PRN
  Administered 2023-04-16: 3000 mL

## 2023-04-16 MED ORDER — 0.9 % SODIUM CHLORIDE (POUR BTL) OPTIME
TOPICAL | Status: DC | PRN
  Administered 2023-04-16: 1000 mL

## 2023-04-16 MED ORDER — FENTANYL CITRATE (PF) 100 MCG/2ML IJ SOLN
INTRAMUSCULAR | Status: DC | PRN
  Administered 2023-04-16: 25 ug via INTRAVENOUS
  Administered 2023-04-16: 75 ug via INTRAVENOUS

## 2023-04-16 MED ORDER — ADULT MULTIVITAMIN W/MINERALS CH
1.0000 | ORAL_TABLET | Freq: Every day | ORAL | Status: DC
  Administered 2023-04-16 – 2023-04-28 (×12): 1 via ORAL
  Filled 2023-04-16 (×13): qty 1

## 2023-04-16 MED ORDER — LIDOCAINE HCL (PF) 2 % IJ SOLN
INTRAMUSCULAR | Status: AC
  Filled 2023-04-16: qty 5

## 2023-04-16 MED ORDER — PROPOFOL 10 MG/ML IV BOLUS
INTRAVENOUS | Status: DC | PRN
  Administered 2023-04-16: 30 mg via INTRAVENOUS
  Administered 2023-04-16: 70 mg via INTRAVENOUS

## 2023-04-16 MED ORDER — DROPERIDOL 2.5 MG/ML IJ SOLN: 0.6250 mg | Freq: Once | INTRAMUSCULAR | Status: DC | PRN

## 2023-04-16 MED ORDER — SUGAMMADEX SODIUM 200 MG/2ML IV SOLN
INTRAVENOUS | Status: DC | PRN
  Administered 2023-04-16: 150 mg via INTRAVENOUS

## 2023-04-16 MED ORDER — FENTANYL CITRATE (PF) 100 MCG/2ML IJ SOLN
25.0000 ug | INTRAMUSCULAR | Status: DC | PRN
  Administered 2023-04-16: 25 ug via INTRAVENOUS
  Administered 2023-04-16: 50 ug via INTRAVENOUS
  Administered 2023-04-16 (×2): 25 ug via INTRAVENOUS

## 2023-04-16 MED ORDER — JUVEN PO PACK
1.0000 | PACK | Freq: Two times a day (BID) | ORAL | Status: DC
  Administered 2023-04-17 – 2023-04-25 (×14): 1 via ORAL

## 2023-04-16 MED ORDER — ONDANSETRON HCL 4 MG/2ML IJ SOLN
INTRAMUSCULAR | Status: AC
  Filled 2023-04-16: qty 2

## 2023-04-16 MED ORDER — VANCOMYCIN HCL 1000 MG IV SOLR
INTRAVENOUS | Status: DC | PRN
  Administered 2023-04-16: 1000 mg via TOPICAL

## 2023-04-16 MED ORDER — DEXAMETHASONE SODIUM PHOSPHATE 10 MG/ML IJ SOLN
INTRAMUSCULAR | Status: DC | PRN
  Administered 2023-04-16: 10 mg via INTRAVENOUS

## 2023-04-16 MED ORDER — PROPOFOL 10 MG/ML IV BOLUS
INTRAVENOUS | Status: AC
  Filled 2023-04-16: qty 20

## 2023-04-16 MED ORDER — ACETAMINOPHEN 10 MG/ML IV SOLN
INTRAVENOUS | Status: DC | PRN
  Administered 2023-04-16: 1000 mg via INTRAVENOUS

## 2023-04-16 MED ORDER — EPHEDRINE SULFATE (PRESSORS) 50 MG/ML IJ SOLN
INTRAMUSCULAR | Status: DC | PRN
  Administered 2023-04-16: 5 mg via INTRAVENOUS

## 2023-04-16 MED ORDER — ACETAMINOPHEN 10 MG/ML IV SOLN
INTRAVENOUS | Status: AC
  Filled 2023-04-16: qty 100

## 2023-04-16 MED ORDER — PHENYLEPHRINE 80 MCG/ML (10ML) SYRINGE FOR IV PUSH (FOR BLOOD PRESSURE SUPPORT)
PREFILLED_SYRINGE | INTRAVENOUS | Status: AC
  Filled 2023-04-16: qty 20

## 2023-04-16 MED ORDER — LIDOCAINE HCL (CARDIAC) PF 100 MG/5ML IV SOSY
PREFILLED_SYRINGE | INTRAVENOUS | Status: DC | PRN
  Administered 2023-04-16: 50 mg via INTRAVENOUS

## 2023-04-16 MED ORDER — ONDANSETRON HCL 4 MG/2ML IJ SOLN
INTRAMUSCULAR | Status: DC | PRN
  Administered 2023-04-16: 4 mg via INTRAVENOUS

## 2023-04-16 MED ORDER — CEFAZOLIN SODIUM-DEXTROSE 2-3 GM-%(50ML) IV SOLR
INTRAVENOUS | Status: DC | PRN
  Administered 2023-04-16: 2 g via INTRAVENOUS

## 2023-04-16 MED ORDER — PHENYLEPHRINE 80 MCG/ML (10ML) SYRINGE FOR IV PUSH (FOR BLOOD PRESSURE SUPPORT)
PREFILLED_SYRINGE | INTRAVENOUS | Status: DC | PRN
  Administered 2023-04-16: 80 ug via INTRAVENOUS
  Administered 2023-04-16 (×4): 160 ug via INTRAVENOUS
  Administered 2023-04-16: 80 ug via INTRAVENOUS
  Administered 2023-04-16: 160 ug via INTRAVENOUS

## 2023-04-16 MED ORDER — DEXAMETHASONE SODIUM PHOSPHATE 10 MG/ML IJ SOLN
INTRAMUSCULAR | Status: AC
  Filled 2023-04-16: qty 1

## 2023-04-16 SURGICAL SUPPLY — 55 items
ADH SKN CLS APL DERMABOND .7 (GAUZE/BANDAGES/DRESSINGS) ×2
APL PRP STRL LF DISP 70% ISPRP (MISCELLANEOUS) ×2
BASIN KIT SINGLE STR (MISCELLANEOUS) ×2 IMPLANT
CHLORAPREP W/TINT 26 (MISCELLANEOUS) ×2 IMPLANT
CNTNR URN SCR LID CUP LEK RST (MISCELLANEOUS) ×2 IMPLANT
CONT SPEC 4OZ STRL OR WHT (MISCELLANEOUS) ×2
DERMABOND ADVANCED .7 DNX12 (GAUZE/BANDAGES/DRESSINGS) ×2 IMPLANT
DRAPE C ARM PK CFD 31 SPINE (DRAPES) ×2 IMPLANT
DRAPE LAPAROTOMY 100X77 ABD (DRAPES) ×2 IMPLANT
DRAPE SURG 17X11 SM STRL (DRAPES) ×2 IMPLANT
DRSG OPSITE 4X5.5 SM (GAUZE/BANDAGES/DRESSINGS) IMPLANT
DRSG OPSITE POSTOP 4X6 (GAUZE/BANDAGES/DRESSINGS) IMPLANT
DRSG TEGADERM 4X4.75 (GAUZE/BANDAGES/DRESSINGS) ×2 IMPLANT
ELECT REM PT RETURN 9FT ADLT (ELECTROSURGICAL) ×2
ELECTRODE REM PT RTRN 9FT ADLT (ELECTROSURGICAL) ×2 IMPLANT
GAUZE SPONGE 2X2 STRL 8-PLY (GAUZE/BANDAGES/DRESSINGS) IMPLANT
GAUZE SPONGE 4X4 12PLY STRL (GAUZE/BANDAGES/DRESSINGS) IMPLANT
GLOVE BIOGEL PI IND STRL 6.5 (GLOVE) ×2 IMPLANT
GLOVE BIOGEL PI IND STRL 8.5 (GLOVE) ×2 IMPLANT
GLOVE SURG SYN 6.5 ES PF (GLOVE) ×2 IMPLANT
GLOVE SURG SYN 6.5 PF PI (GLOVE) ×2 IMPLANT
GLOVE SURG SYN 8.5 E (GLOVE) ×6 IMPLANT
GLOVE SURG SYN 8.5 PF PI (GLOVE) ×6 IMPLANT
GOWN SRG LRG LVL 4 IMPRV REINF (GOWNS) ×2 IMPLANT
GOWN SRG XL LVL 3 NONREINFORCE (GOWNS) ×2 IMPLANT
GOWN STRL NON-REIN TWL XL LVL3 (GOWNS) ×2
GOWN STRL REIN LRG LVL4 (GOWNS) ×2
HEMOVAC 400CC 10FR (MISCELLANEOUS) ×2 IMPLANT
IV NS IRRIG 3000ML ARTHROMATIC (IV SOLUTION) ×2 IMPLANT
JET LAVAGE IRRISEPT WOUND (IRRIGATION / IRRIGATOR) ×2
KIT SPINAL PRONEVIEW (KITS) ×2 IMPLANT
LAVAGE JET IRRISEPT WOUND (IRRIGATION / IRRIGATOR) IMPLANT
MANIFOLD NEPTUNE II (INSTRUMENTS) ×2 IMPLANT
MARKER SKIN DUAL TIP RULER LAB (MISCELLANEOUS) ×2 IMPLANT
NDL SAFETY ECLIP 18X1.5 (MISCELLANEOUS) ×2 IMPLANT
NS IRRIG 1000ML POUR BTL (IV SOLUTION) ×2 IMPLANT
PACK LAMINECTOMY ARMC (PACKS) ×2 IMPLANT
PAD ARMBOARD 7.5X6 YLW CONV (MISCELLANEOUS) ×2 IMPLANT
PREVENA INCISION MGT 90 150 (MISCELLANEOUS) IMPLANT
PULSAVAC PLUS IRRIG FAN TIP (DISPOSABLE)
SOL PREP PVP 2OZ (MISCELLANEOUS)
SOLUTION IRRIG SURGIPHOR (IV SOLUTION) ×2 IMPLANT
SOLUTION PREP PVP 2OZ (MISCELLANEOUS) IMPLANT
STAPLER SKIN PROX 35W (STAPLE) ×2 IMPLANT
SUT ETHILON 3 0 PS 1 (SUTURE) IMPLANT
SUT ETHILON 3-0 FS-10 30 BLK (SUTURE) ×2
SUT NYLON 3 0 (SUTURE) ×2 IMPLANT
SUT VIC AB 2-0 CT1 18 (SUTURE) ×2 IMPLANT
SUTURE EHLN 3-0 FS-10 30 BLK (SUTURE) IMPLANT
SWAB CULTURE AMIES ANAERIB BLU (MISCELLANEOUS) IMPLANT
SYR 3ML LL SCALE MARK (SYRINGE) ×2 IMPLANT
TAPE TRANSPORE STRL 2 31045 (GAUZE/BANDAGES/DRESSINGS) IMPLANT
TIP FAN IRRIG PULSAVAC PLUS (DISPOSABLE) IMPLANT
TRAP FLUID SMOKE EVACUATOR (MISCELLANEOUS) ×2 IMPLANT
WATER STERILE IRR 1000ML POUR (IV SOLUTION) ×4 IMPLANT

## 2023-04-16 NOTE — Consult Note (Signed)
WOC Nurse wound follow up Wound type: Surgical  Wound dehiscence noted upon admission, patient to OR today with Neurosurgery, Dr C. Yarbrough and an incisional NPWT device was placed. These are to be left in place for 5-7 days then discontinued and discarded.  Patient to follow with Dr. Myer Haff as directed. Pleases consult with his service for wound related questions.  WOC nursing team will not follow, but will remain available to this patient, the nursing and medical teams.   Thank you for inviting [place] to participate in this patient's Plan of Care.  Ladona Mow, MSN, RN, CNS, GNP, Leda Min, [employer], [employer] phone:  [phone]

## 2023-04-16 NOTE — Anesthesia Procedure Notes (Signed)
Procedure Name: Intubation Date/Time: 04/16/2023 8:04 AM  Performed by: Hezzie Bump, CRNAPre-anesthesia Checklist: Patient identified, Patient being monitored, Timeout performed, Emergency Drugs available and Suction available Patient Re-evaluated:Patient Re-evaluated prior to induction Oxygen Delivery Method: Circle system utilized Preoxygenation: Pre-oxygenation with 100% oxygen Induction Type: IV induction Ventilation: Mask ventilation without difficulty Laryngoscope Size: Mac and 3 Grade View: Grade I Tube type: Oral Tube size: 7.0 mm Number of attempts: 1 Airway Equipment and Method: Stylet and Video-laryngoscopy Placement Confirmation: ETT inserted through vocal cords under direct vision, positive ETCO2 and breath sounds checked- equal and bilateral Secured at: 18 cm Tube secured with: Tape Dental Injury: Teeth and Oropharynx as per pre-operative assessment

## 2023-04-16 NOTE — Progress Notes (Signed)
Initial Nutrition Assessment  DOCUMENTATION CODES:   Not applicable  INTERVENTION:  - Add Ensure Enlive po BID, each supplement provides 350 kcal and 20 grams of protein.  - Add -1 packet Juven BID, each packet provides 95 calories, 2.5 grams of protein (collagen), and 9.8 grams of carbohydrate (3 grams sugar); also contains 7 grams of L-arginine and L-glutamine, 300 mg vitamin C, 15 mg vitamin E, 1.2 mcg vitamin B-12, 9.5 mg zinc, 200 mg calcium, and 1.5 g  Calcium Beta-hydroxy-Beta-methylbutyrate to support wound healing  - Add MVI q day.   NUTRITION DIAGNOSIS:   Increased nutrient needs related to wound healing as evidenced by estimated needs.  GOAL:   Patient will meet greater than or equal to 90% of their needs  MONITOR:   PO intake  REASON FOR ASSESSMENT:   Consult Assessment of nutrition requirement/status  ASSESSMENT:   89 y.o. female admits related to wound check. PMH includes: arthritis, afib, GERD, HLD, HTN. Pt is currently receiving medical management related to wound dehiscence.  Meds reviewed: lipitor, Vitamin D3, colace. Labs reviewed: WDL.   RD attempted to call room but no answer. Pt scheduled for a wound debridement today. Pt is on a Regular diet. No intakes documented this admission. RD will add Ensure BID for now with MVI. Will continue to closely monitor PO intakes. No significant wt loss per record.   NUTRITION - FOCUSED PHYSICAL EXAM:  Remote assessment.   Diet Order:   Diet Order             Diet regular Fluid consistency: Thin  Diet effective now                   EDUCATION NEEDS:   Not appropriate for education at this time  Skin:  Skin Assessment: Skin Integrity Issues: Skin Integrity Issues:: Incisions Incisions: Medial back  Last BM:  6/29 - type 4  Height:   Ht Readings from Last 1 Encounters:  04/14/23 5\' 1"  (1.549 m)    Weight:   Wt Readings from Last 1 Encounters:  04/14/23 56.7 kg    Ideal Body Weight:      BMI:  Body mass index is 23.62 kg/m.  Estimated Nutritional Needs:   Kcal:  1400-1700 kcals  Protein:  70-85 gm  Fluid:  >/= 1.4 L  Bethann Humble, RD, LDN, CNSC.

## 2023-04-16 NOTE — OR Nursing (Signed)
Attempted to contact 2A floor nurse for report twice. Nurse unable to be found by colleagues to give report, noted by unit secretary her ASCOM was left at the unit desk. I will update day-shift OR charge to attempt to receive report before patient comes for surgery.

## 2023-04-16 NOTE — Interval H&P Note (Signed)
History and Physical Interval Note:  04/16/2023 7:42 AM  Sheryl Michael  has presented today for surgery, with the diagnosis of wound dehiscence.  The various methods of treatment have been discussed with the patient and family. After consideration of risks, benefits and other options for treatment, the patient has consented to  Procedure(s) with comments: LUMBAR WOUND DEBRIDEMENT (N/A) - thoracolumbar wound irrigation and debridement as a surgical intervention.  The patient's history has been reviewed, patient examined, no change in status, stable for surgery.  I have reviewed the patient's chart and labs.  Questions were answered to the patient's satisfaction.    Heart sounds normal no MRG. Chest Clear to Auscultation Bilaterally.  Edu On

## 2023-04-16 NOTE — Transfer of Care (Signed)
Immediate Anesthesia Transfer of Care Note  Patient: Sheryl Michael  Procedure(s) Performed: LUMBAR WOUND DEBRIDEMENT APPLICATION OF WOUND VAC (Back)  Patient Location: PACU  Anesthesia Type:General  Level of Consciousness: drowsy  Airway & Oxygen Therapy: Patient Spontanous Breathing and Patient connected to face mask oxygen  Post-op Assessment: Report given to RN and Post -op Vital signs reviewed and stable  Post vital signs: Reviewed and stable  Last Vitals:  Vitals Value Taken Time  BP 135/43 04/16/23 0937  Temp    Pulse 69 04/16/23 0939  Resp 19 04/16/23 0939  SpO2 100 % 04/16/23 0939  Vitals shown include unvalidated device data.  Last Pain:  Vitals:   04/16/23 0600  TempSrc:   PainSc: 0-No pain         Complications: No notable events documented.

## 2023-04-16 NOTE — Evaluation (Signed)
Physical Therapy Evaluation Patient Details Name: Sheryl Michael MRN: [id number] DOB: October 22, 1930 Today's Date: 04/16/2023  History of Present Illness  Sheryl Michael presented with a wound dehiscence after a prior spine surgery.   She was brought back to the emergency department for evaluation.  She had no evidence of infection at that time.  Due to wound dehiscence, irrigation and debridement with reclosure was recommended. Pt underwent .  Irrigation and debridement of the thoracic and lumbar wound on 04/16/2023 has two drainage.  Clinical Impression  Pt received in bed agreeable to Physical therapy interventions. Pt [facility] due to discharge from the back incision. Pt in pain today. Pt want to return to [facility]. PT assessment revealed no SLR lag, sensation dn coordination intact. PT spent time educating pt about back precautions to prevent injuries.  Pt performed bed mobility and stood by the bed side with Sup to Min assist with VC to maintain back precautions. Pt remained in pain which increases with movement but returns to 4/10 with rest. Pt demonstrates good sitting and standing balance with support. Pt able to scoot sideways and forward/back ward while sitting on the   EOB without LOB. Pt tol tx well. Pt plans to get OOB to Chair tomorrow. PT will continue in acute care. Pt will benefit from continued rehab beyond acute care.      Recommendations for follow up therapy are one component of a multi-disciplinary discharge planning process, led by the attending physician.  Recommendations may be updated based on patient status, additional functional criteria and insurance authorization.  Follow Up Recommendations Can patient physically be transported by private vehicle: No     Assistance Recommended at Discharge Intermittent Supervision/Assistance  Patient can return home with the following  A lot of help with walking and/or transfers;A lot of help with  bathing/dressing/bathroom;Assistance with cooking/housework;Assistance with feeding;Direct supervision/assist for medications management;Direct supervision/assist for financial management;Assist for transportation;Help with stairs or ramp for entrance    Equipment Recommendations None recommended by PT  Recommendations for Other Services       Functional Status Assessment Patient has had a recent decline in their functional status and demonstrates the ability to make significant improvements in function in a reasonable and predictable amount of time.     Precautions / Restrictions Precautions Precautions: Back;Fall Precaution Booklet Issued: Yes (comment) Precaution Comments: fall Restrictions Weight Bearing Restrictions: No      Mobility  Bed Mobility Overal bed mobility: Needs Assistance Bed Mobility: Rolling, Supine to Sit, Sit to Supine Rolling: Supervision Sidelying to sit: Supervision Supine to sit: Min guard Sit to supine: Min guard Sit to sidelying: Min assist General bed mobility comments: slow and able to scoot to the edgge of the bed.    Transfers Overall transfer level: Needs assistance Equipment used: Rolling walker (2 wheels) Transfers: Sit to/from Stand Sit to Stand: Min guard                Ambulation/Gait: deferred pt refused.          Gait velocity: decreased     General Gait Details: Deferred today becasue pt tired and in pain so refused to walk.  sheTook side steps with OT earlier.  Stairs.N/T            Wheelchair Mobility    Modified Rankin (Stroke Patients Only)       Balance Overall balance assessment: Needs assistance Sitting-balance support: Bilateral upper extremity supported Sitting balance-Leahy Scale: Good Sitting  balance - Comments: Tol EOB sitting well but limited with pain.   Standing balance support: Bilateral upper extremity supported Standing balance-Leahy Scale: Good Standing balance comment: no LOB noted  with BUE support                             Pertinent Vitals/Pain Pain Assessment Pain Assessment: 0-10 Pain Score: 4  Pain Location: back Pain Descriptors / Indicators: Grimacing, Guarding, Aching Pain Intervention(s): Monitored during session    Home Living Family/patient expects to be discharged to:: Skilled nursing facility Living Arrangements: Other (Comment)                 Additional Comments: Lives at [facility], and arrived to [facility].    Prior Function Prior Level of Function : Driving;History of Falls (last six months);Independent/Modified Independent             Mobility Comments: Pt using RW for mobility ADLs Comments: Pt reports indep with ADL, med mgt, facility provides meals and pt was driving but will not be driving anymore     Hand Dominance        Extremity/Trunk Assessment   Upper Extremity Assessment Upper Extremity Assessment: Generalized weakness    Lower Extremity Assessment Lower Extremity Assessment: Generalized weakness       Communication   Communication: No difficulties  Cognition Arousal/Alertness: Awake/alert Behavior During Therapy: WFL for tasks assessed/performed                                            General Comments General comments (skin integrity, edema, etc.): Dressing intact.    Exercises Total Joint Exercises Ankle Circles/Pumps: AROM, Both, 10 reps Gluteal Sets: Both, 10 reps General Exercises - Lower Extremity Heel Slides: AROM, Supine, 5 reps, Both Other Exercises Other Exercises: pt advised to do them in bed every couple hours.   Assessment/Plan    PT Assessment Patient needs continued PT services  PT Problem List Decreased strength;Decreased range of motion;Decreased activity tolerance;Decreased balance;Decreased mobility;Decreased coordination;Decreased cognition;Decreased knowledge of precautions       PT Treatment Interventions DME  instruction;Gait training;Stair training;Functional mobility training;Therapeutic activities;Therapeutic exercise;Balance training;Patient/family education;Wheelchair mobility training;Cognitive remediation    PT Goals (Current goals can be found in the Care Plan section)  Acute Rehab PT Goals Patient Stated Goal: " i want to be like it was before. Stop hurting and do my thing." PT Goal Formulation: With patient Time For Goal Achievement: 04/30/23 Potential to Achieve Goals: Good    Frequency 7X/week     Co-evaluation               AM-PAC PT "6 Clicks" Mobility  Outcome Measure Help needed turning from your back to your side while in a flat bed without using bedrails?: None Help needed moving from lying on your back to sitting on the side of a flat bed without using bedrails?: A Little Help needed moving to and from a bed to a chair (including a wheelchair)?: A Little Help needed standing up from a chair using your arms (e.g., wheelchair or bedside chair)?: A Little Help needed to walk in hospital room?: Total Help needed climbing 3-5 steps with a railing? : Total 6 Click Score: 15    End of Session Equipment Utilized During Treatment: Gait belt Activity Tolerance: Patient  tolerated treatment well;Patient limited by pain Patient left: in bed;with bed alarm set;with call bell/phone within reach Nurse Communication: Mobility status PT Visit Diagnosis: Difficulty in walking, not elsewhere classified (R26.2);Other abnormalities of gait and mobility (R26.89);Muscle weakness (generalized) (M62.81);Other symptoms and signs involving the nervous system (Q65.784)    Time: 6962-9528 PT Time Calculation (min) (ACUTE ONLY): 24 min   Charges:   PT Evaluation $PT Eval Moderate Complexity: 1 Mod PT Treatments $Therapeutic Activity: 8-22 mins       Janet Berlin PT DPT 3:04 PM,04/16/23

## 2023-04-16 NOTE — Progress Notes (Signed)
PROGRESS NOTE    Sheryl Michael  ZOX:[id number] DOB: 03/19/1931 DOA: 04/14/2023 PCP: Marguarite Arbour, MD    Brief Narrative:  89 y.o. female with medical history significant of T8-L2 posterior spinal fusion with T8-9 and L1-2 cement augmentation done on March 21, 2023.  Indication seems to have been increasing angulation of L1 acute fracture that was diagnosed before the procedure at approximately Mar 17 2023.  Patient was subsequently discharged on April 05, 2023.   [facility]  History obtained from daughter.  Daughter details that patient has not been eating or mobilizing well.  Is on fairly high-dose narcotics.  Admitted for wound dehiscence and concern for infection  6/29: Case discussed in detail with neurosurgery Dr. Marcell Barlow.  I agree that patient may benefit from return to OR for wound closure and possible VAC placement.  Wound does not appear obviously infected however healing may be difficult without reexploration wound.  6/30: S/p wound exploration.  Vac placed.  No obv sign of infection  Assessment & Plan:   Principal Problem:   Wound dehiscence Active Problems:   Atrial fibrillation with RVR (HCC)   Visit for wound check   Dementia with behavioral disturbance [facility])  Surgical wound dehiscence Patient is 3 weeks post T8 L2 posterior spinal fusion.  Procedure done on 6/4.  Wound does not appear obviously infected however is dehisced Status post wound closure on 6/30 Plan: Discussed with neurosurgery.  Continue empiric antibiotics for now.  Culture taken in OR.  Ensure no signs of infection.  Continue IV fluids for now.  RD consultation.  Encourage p.o. fluid intake.  Multimodal pain regimen.  Minimize use of narcotics.  Therapy as tolerated.   Atrial fibrillation with rapid ventricular response Rate control improved.  Continue IV fluids.   Continue home diltiazem Continue holding Xarelto for today, consider restarting  7/1  Dementia with behavioral disturbances PTA Namenda and Lexapro  Hypothyroidism PTA Synthroid  Suspected malnutrition RD consultation Supplemental shakes  Hyperlipidemia Lipitor  GERD PPI    DVT prophylaxis: Xarelto, on hold for Code Status: DNR, discussed with family Family Communication: 2 family members at bedside 6/29, 6/30 Disposition Plan: Status is: Observation The patient will require care spanning > 2 midnights and should be moved to inpatient because: Status post wound exploration by neurosurgery.  Wound infection remains on differential.  Wound culture pending.  Remains on IV antibiotics.     Level of care: Progressive  Consultants:  Neurosurgery  Procedures:  None  Antimicrobials: Cefepime Vancomycin    Subjective: Seen and examined.  Resting in bed.  Appears in no distress.  Majority of history from family members.  Objective: Vitals:   04/16/23 0200 04/16/23 0344 04/16/23 0400 04/16/23 0600  BP:  123/80    Pulse:  73    Resp: 18 18 16 17   Temp:  98.3 F (36.8 C)    TempSrc:      SpO2: 100% 99%    Weight:      Height:        Intake/Output Summary (Last 24 hours) at 04/16/2023 0924 Last data filed at 04/16/2023 0900 Gross per 24 hour  Intake 2650.73 ml  Output 5 ml  Net 2645.73 ml   Filed Weights   04/14/23 1930  Weight: 56.7 kg    Examination:  General exam: No acute distress.  Appears frail Respiratory system: Lungs clear.  Normal work of breathing.  Room air Cardiovascular system: S1-S2, RRR, no murmurs, no pedal  edema Gastrointestinal system: Thin, soft, NT/ND, normal bowel sounds Central nervous system: Alert and oriented. No focal neurological deficits. Extremities: Symmetric 5 x 5 power. Skin: No rashes, lesions or ulcers Psychiatry: Judgement and insight appear normal. Mood & affect appropriate.     Data Reviewed: I have personally reviewed following labs and imaging studies  CBC: Recent Labs  Lab  04/14/23 1957 04/15/23 0538  WBC 6.7 5.9  NEUTROABS 4.6  --   HGB 11.6* 10.5*  HCT 36.5 33.6*  MCV 97.6 98.8  PLT 252 255   Basic Metabolic Panel: Recent Labs  Lab 04/14/23 1959 04/15/23 0538  NA 133* 139  K 4.4 4.0  CL 101 109  CO2 19* 24  GLUCOSE 141* 87  BUN 17 15  CREATININE 1.00 0.78  CALCIUM 8.6* 8.6*  PHOS 4.5  --    GFR: Estimated Creatinine Clearance: 36 mL/min (by C-G formula based on SCr of 0.78 mg/dL). Liver Function Tests: Recent Labs  Lab 04/14/23 1959  AST 33  ALT 26  ALKPHOS 99  BILITOT 1.0  PROT 6.8  ALBUMIN 2.7*   No results for input(s): "LIPASE", "AMYLASE" in the last 168 hours. No results for input(s): "AMMONIA" in the last 168 hours. Coagulation Profile: Recent Labs  Lab 04/15/23 0538  INR 1.2   Cardiac Enzymes: No results for input(s): "CKTOTAL", "CKMB", "CKMBINDEX", "TROPONINI" in the last 168 hours. BNP (last 3 results) No results for input(s): "PROBNP" in the last 8760 hours. HbA1C: No results for input(s): "HGBA1C" in the last 72 hours. CBG: No results for input(s): "GLUCAP" in the last 168 hours. Lipid Profile: No results for input(s): "CHOL", "HDL", "LDLCALC", "TRIG", "CHOLHDL", "LDLDIRECT" in the last 72 hours. Thyroid Function Tests: No results for input(s): "TSH", "T4TOTAL", "FREET4", "T3FREE", "THYROIDAB" in the last 72 hours. Anemia Panel: Recent Labs    04/15/23 0538  VITAMINB12 892  FOLATE 13.2  FERRITIN 358*  TIBC 199*  IRON 43  RETICCTPCT 2.3   Sepsis Labs: Recent Labs  Lab 04/14/23 1957 04/15/23 0041 04/15/23 0538  PROCALCITON  --   --  <0.10  LATICACIDVEN 1.8 1.8  --     Recent Results (from the past 240 hour(s))  Aerobic/Anaerobic Culture w Gram Stain (surgical/deep wound)     Status: None (Preliminary result)   Collection Time: 04/14/23  7:57 PM   Specimen: Incision  Result Value Ref Range Status   Specimen Description   Final    INCISION Performed at [facility], [address], [place], [place] [address]    Special Requests   Final    NONE Performed at [facility], [address], [place], [place] [address]    Gram Stain   Final    NO WBC SEEN NO ORGANISMS SEEN Performed at [facility], [address] [place], [place] [address]    Culture PENDING  Incomplete   Report Status PENDING  Incomplete  Blood Culture (routine x 2)     Status: None (Preliminary result)   Collection Time: 04/14/23  7:57 PM   Specimen: BLOOD  Result Value Ref Range Status   Specimen Description BLOOD BLOOD LEFT HAND  Final   Special Requests   Final    BOTTLES DRAWN AEROBIC AND ANAEROBIC Blood Culture results may not be optimal due to an inadequate volume of blood received in culture bottles   Culture   Final    NO GROWTH 2 DAYS Performed at [facility], [address] [place],  [place] [address]    Report Status PENDING  Incomplete  Blood Culture (routine x 2)     Status: None (Preliminary result)   Collection Time: 04/14/23  8:01 PM   Specimen: BLOOD  Result Value Ref Range Status   Specimen Description BLOOD BLOOD RIGHT ARM  Final   Special Requests   Final    BOTTLES DRAWN AEROBIC AND ANAEROBIC Blood Culture results may not be optimal due to an excessive volume of blood received in culture bottles   Culture   Final    NO GROWTH 2 DAYS Performed at [facility], [address], [place], [place] [address]    Report Status PENDING  Incomplete         Radiology Studies: DG Chest Port 1 View  Result Date: 04/14/2023 CLINICAL DATA:  Possible sepsis EXAM: PORTABLE CHEST 1 VIEW COMPARISON:  04/04/2023 FINDINGS: Cardiac shadow is within normal limits. Aortic calcifications are noted. The lungs are well aerated bilaterally. Old rib fractures are noted bilaterally. Postsurgical changes in the thoracolumbar spine are seen. No acute abnormality is noted. IMPRESSION: No acute abnormality noted. Electronically Signed   By: Alcide Clever M.D.    On: 04/14/2023 20:19        Scheduled Meds:  [MAR Hold] anastrozole  1 mg Oral Daily   [MAR Hold] atorvastatin  40 mg Oral Daily   [MAR Hold] cholecalciferol  500 Units Oral Daily   [MAR Hold] diltiazem  180 mg Oral Daily   [MAR Hold] docusate sodium  100 mg Oral BID   [MAR Hold] escitalopram  10 mg Oral Daily   [MAR Hold] feeding supplement  237 mL Oral BID BM   [MAR Hold] levothyroxine  50 mcg Oral Q0600   [MAR Hold] memantine  5 mg Oral BID   [MAR Hold] pantoprazole  40 mg Oral Daily   [MAR Hold] sodium chloride flush  3 mL Intravenous Q12H   Continuous Infusions:  sodium chloride 75 mL/hr at 04/16/23 0757   [MAR Hold] ceFEPime (MAXIPIME) IV Stopped (04/15/23 2237)   [MAR Hold] vancomycin       LOS: 0 days      Tresa Moore, MD [facility]   If 7PM-7AM, please contact night-coverage  04/16/2023, 9:24 AM

## 2023-04-16 NOTE — Anesthesia Preprocedure Evaluation (Signed)
Anesthesia Evaluation  Patient identified by MRN, date of birth, ID band Patient awake    Reviewed: Allergy & Precautions, NPO status , Patient's Chart, lab work & pertinent test results  History of Anesthesia Complications Negative for: history of anesthetic complications  Airway Mallampati: II       Dental  (+) Dental Advidsory Given, Poor Dentition   Pulmonary neg pulmonary ROS, neg shortness of breath, neg sleep apnea, neg COPD, neg recent URI, Not current smoker          Cardiovascular hypertension, Pt. on medications (-) angina (-) Past MI and (-) CHF + dysrhythmias Atrial Fibrillation (-) Valvular Problems/Murmurs     Neuro/Psych neg Seizures PSYCHIATRIC DISORDERS  Depression    TIA (Arm weakness, resolved quickly)   GI/Hepatic Neg liver ROS,GERD  Medicated and Controlled,,  Endo/Other  neg diabetesHypothyroidism    Renal/GU Renal InsufficiencyRenal disease     Musculoskeletal   Abdominal   Peds  Hematology   Anesthesia Other Findings Past Medical History: No date: Arthritis No date: Atrial fibrillation (HCC) 2006: Breast cancer (HCC)     Comment:  right breast ca with lumpectomy and rad tx, left breast               ca 2021lumpectomy and rad tx No date: Colon polyp No date: Complication of anesthesia     Comment:  questions about husband who passed in 2012  No date: Cystocele No date: Depression No date: Dysrhythmia No date: Family history of breast cancer No date: Female bladder prolapse No date: GERD (gastroesophageal reflux disease) No date: Goiter No date: Hyperlipemia No date: Hypertension No date: Hypothyroidism No date: Osteoporosis 2006: Personal history of radiation therapy     Comment:  right breast ca and left breast 2021 No date: Pneumonia 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 No date: Recurrent UTI No date: Reflux No date: TIA (transient ischemic attack) No date: Vaginal atrophy    Reproductive/Obstetrics                             Anesthesia Physical Anesthesia Plan  ASA: III  Anesthesia Plan: General   Post-op Pain Management:    Induction: Intravenous  PONV Risk Score and Plan: 3 and Dexamethasone, Ondansetron and Treatment may vary due to age or medical condition  Airway Management Planned: Oral ETT  Additional Equipment:   Intra-op Plan:   Post-operative Plan:   Informed Consent: I have reviewed the patients History and Physical, chart, labs and discussed the procedure including the risks, benefits and alternatives for the proposed anesthesia with the patient or authorized representative who has indicated his/her understanding and acceptance.       Plan Discussed with:   Anesthesia Plan Comments:         Anesthesia Quick Evaluation  

## 2023-04-16 NOTE — Op Note (Signed)
Indications: Ms. Irmgard Auletta presented with a wound dehiscence after a prior spine surgery.   She was brought back to the emergency department for evaluation.  She had no evidence of infection at that time.  Due to wound dehiscence, irrigation and debridement with reclosure was recommended.     Findings: No evidence of purulence  Preoperative Diagnosis: wound dehiscence Postoperative Diagnosis: same   EBL: 5 ml IVF: see AR ml Drains: 1 placed Disposition: Extubated and Stable to PACU Complications: none  A foley catheter was placed.   Preoperative Note:   Risks of surgery discussed include: infection, bleeding, stroke, coma, death, paralysis, CSF leak, nerve/spinal cord injury, numbness, tingling, weakness, complex regional pain syndrome, recurrent stenosis and/or disc herniation, vascular injury, development of instability, neck/back pain, need for further surgery, persistent symptoms, development of deformity, and the risks of anesthesia. The patient understood these risks and agreed to proceed.  Operative Note:  1.  Irrigation and debridement of the thoracic and lumbar wound   The patient was brought to the Operating Room, intubated and turned into the prone position. All pressure points were checked and double checked.  The prior incision was identified.  It was carefully cleaned.  There is no evidence of obvious purulence or odor.  The area was prepped and draped in standard fashion.  A timeout was performed.  Preoperative antibiotics were given.  The incision was opened.  The deep fascia was identified.  There was no violation to the muscular fascia, so the implants were not uncovered.  There was no evidence of purulent drainage.  The area deep to the lumbodorsal fascia was inspected and irrigated profusely.  The pulse irrigator was used.  After irrigation, the areas of skin and superficial tissue around the dehiscence were carefully debrided.  Excisional debridement was performed  of all tissues that appear to be nonviable from the skin down to the lumbodorsal fascia.  At this point, we turned attention to closure.  Vancomycin powder was placed in the wound.  A drain was placed.  The wound was then closed in layers using 0 and 2-0 Vicryl with 3-0 nylon on the skin.  An incisional wound VAC was placed.  The patient was then flipped supine and extubated with incident. All counts were correct times 2 at the end of the case. No immediate complications were noted.   Venetia Night MD

## 2023-04-16 NOTE — Anesthesia Postprocedure Evaluation (Signed)
Anesthesia Post Note  Patient: Sheryl Michael  Procedure(s) Performed: LUMBAR WOUND DEBRIDEMENT APPLICATION OF WOUND VAC (Back)  Patient location during evaluation: PACU Anesthesia Type: General Level of consciousness: awake and alert Pain management: pain level controlled Vital Signs Assessment: post-procedure vital signs reviewed and stable Respiratory status: spontaneous breathing, nonlabored ventilation, respiratory function stable and patient connected to nasal cannula oxygen Cardiovascular status: blood pressure returned to baseline and stable Postop Assessment: no apparent nausea or vomiting Anesthetic complications: no   No notable events documented.   Last Vitals:  Vitals:   04/16/23 1030 04/16/23 1045  BP: (!) 160/44 (!) 147/46  Pulse: 60 62  Resp: 13 20  Temp:    SpO2: 96% 95%    Last Pain:  Vitals:   04/16/23 1030  TempSrc:   PainSc: Asleep                 Lenard Simmer

## 2023-04-16 NOTE — Evaluation (Signed)
Occupational Therapy Evaluation Patient Details Name: Sheryl Michael MRN: [id number] DOB: [birth year] Today's Date: 04/16/2023   History of Present Illness Pt is an 89 year old female s/p irrigation and debridement of thoracic and lumbar wound on 04/16/23, T8-L2 posterior spinal fusion with T8-9 and L1-2 cement augmentation done on March 21, 2023.  Indication seems to have been increasing angulation of L1 acute fracture that was diagnosed before the procedure at approximately Mar 17 2023.  Patient was subsequently discharged on April 05, 2023; pmh significant for afib, dementia, hypothyroidism, GERD, malnutrition, hyperiodemia    Clinical Impression   Chart reviewed, pt greeted in bed agreeable to OT evaluation with encouragement. Pt is oriented to self and place, increased time for processing throughout. PTA pt was at rehab following previous spinal surgery on 03/21/23. During this time, she has required assist for ADL/IADL. Pt presents with deficits in strength, endurance, activity tolerance, balance, all affecting safe and optimal Adl completion. She would benefit from post acute OT to address deficits and to facilitate  return to PLOF. OT will continue to follow acutely.        Recommendations for follow up therapy are one component of a multi-disciplinary discharge planning process, led by the attending physician.  Recommendations may be updated based on patient status, additional functional criteria and insurance authorization.   Assistance Recommended at Discharge Frequent or constant Supervision/Assistance  Patient can return home with the following A little help with walking and/or transfers;A lot of help with bathing/dressing/bathroom;Assistance with cooking/housework;Assist for transportation;Help with stairs or ramp for entrance    Functional Status Assessment  Patient has had a recent decline in their functional status and demonstrates the ability to make significant improvements in function  in a reasonable and predictable amount of time.  Equipment Recommendations  BSC/3in1    Recommendations for Other Services       Precautions / Restrictions Precautions Precautions: Back;Fall Precaution Booklet Issued: Yes (comment) Precaution Comments: fall Spinal Brace Comments: per neurosurgery note 6/29 do not wear brace Restrictions Weight Bearing Restrictions: No      Mobility Bed Mobility Overal bed mobility: Needs Assistance Bed Mobility: Rolling, Sidelying to Sit, Sit to Sidelying Rolling: Min assist Sidelying to sit: Mod assist     Sit to sidelying: Max assist General bed mobility comments: mangement of BLEs; frequent vcs throughout for body mechanics     Transfers Overall transfer level: Needs assistance Equipment used: Rolling walker (2 wheels) Transfers: Sit to/from Stand Sit to Stand: Min assist                  Balance Overall balance assessment: Needs assistance Sitting-balance support: Bilateral upper extremity supported Sitting balance-Leahy Scale: Good     Standing balance support: Bilateral upper extremity supported Standing balance-Leahy Scale: Fair                             ADL either performed or assessed with clinical judgement   ADL Overall ADL's : Needs assistance/impaired Eating/Feeding: Set up;Sitting   Grooming: Wash/dry face;Sitting;Set up               Lower Body Dressing: Maximal assistance;Bed level   Toilet Transfer: Minimal assistance Toilet Transfer Details (indicate cue type and reason): simulated with RW, intermittent vcs for technique Toileting- Clothing Manipulation and Hygiene: Maximal assistance Toileting - Clothing Manipulation Details (indicate cue type and reason): anticipate     Functional mobility during ADLs: Min guard;Minimal assistance;Rolling  walker (2 wheels) (one step fwd and back 3 attempts with RW)       Vision Baseline Vision/History: 1 Wears glasses Patient Visual  Report: No change from baseline       Perception     Praxis      Pertinent Vitals/Pain Pain Assessment Pain Assessment: 0-10 Pain Score: 3  Pain Location: back Pain Descriptors / Indicators: Grimacing, Guarding, Aching Pain Intervention(s): Limited activity within patient's tolerance, Monitored during session     Hand Dominance Right   Extremity/Trunk Assessment Upper Extremity Assessment Upper Extremity Assessment: Generalized weakness   Lower Extremity Assessment Lower Extremity Assessment: Generalized weakness   Cervical / Trunk Assessment Cervical / Trunk Assessment: Back Surgery   Communication Communication Communication: No difficulties   Cognition Arousal/Alertness: Awake/alert Behavior During Therapy: WFL for tasks assessed/performed, Flat affect Overall Cognitive Status: No family/caregiver present to determine baseline cognitive functioning Area of Impairment: Orientation, Problem solving                 Orientation Level: Disoriented to, Situation, Time           Problem Solving: Slow processing       General Comments  all lines/leads intact pre/post session    Exercises     Shoulder Instructions      Home Living Family/patient expects to be discharged to:: Skilled nursing facility Living Arrangements: Other (Comment)   Type of Home: Independent living facility                           Additional Comments: pt lives in ALF, currently at rehab      Prior Functioning/Environment Prior Level of Function : History of Falls (last six months)             Mobility Comments: amb wtih RW, ADLs Comments: pt reports generally MOD I with ADL prior to previous admission, assist with IADLs; currently requires assist for all ADL/IADL        OT Problem List: Decreased strength;Pain;Decreased activity tolerance;Impaired balance (sitting and/or standing);Decreased knowledge of use of DME or AE;Decreased knowledge of  precautions;Decreased cognition      OT Treatment/Interventions: Self-care/ADL training;Therapeutic exercise;Therapeutic activities;Cognitive remediation/compensation;DME and/or AE instruction;Patient/family education;Balance training    OT Goals(Current goals can be found in the care plan section) Acute Rehab OT Goals Patient Stated Goal: improve function OT Goal Formulation: With patient Time For Goal Achievement: 04/30/23 Potential to Achieve Goals: Good ADL Goals Pt Will Perform Grooming: with modified independence;standing;sitting Pt Will Perform Lower Body Dressing: with modified independence;sit to/from stand;with adaptive equipment Pt Will Transfer to Toilet: with modified independence;ambulating Pt Will Perform Toileting - Clothing Manipulation and hygiene: with modified independence;sit to/from stand  OT Frequency: Min 2X/week    Co-evaluation              AM-PAC OT "6 Clicks" Daily Activity     Outcome Measure Help from another person eating meals?: A Little Help from another person taking care of personal grooming?: A Little Help from another person toileting, which includes using toliet, bedpan, or urinal?: A Lot Help from another person bathing (including washing, rinsing, drying)?: A Lot Help from another person to put on and taking off regular upper body clothing?: A Little Help from another person to put on and taking off regular lower body clothing?: A Lot 6 Click Score: 15   End of Session Equipment Utilized During Treatment: Rolling walker (2 wheels) Nurse Communication: Mobility status  Activity Tolerance: Patient tolerated treatment well Patient left: with call bell/phone within reach;in bed;with bed alarm set  OT Visit Diagnosis: Other abnormalities of gait and mobility (R26.89);Muscle weakness (generalized) (M62.81);History of falling (Z91.81)                Time: 1610-9604 OT Time Calculation (min): 21 min Charges:  OT General Charges $OT Visit: 1  Visit OT Evaluation $OT Eval Moderate Complexity: 1 Mod Oleta Mouse, OTD OTR/L  04/16/23, 3:24 PM

## 2023-04-17 ENCOUNTER — Telehealth: Payer: Self-pay

## 2023-04-17 ENCOUNTER — Encounter: Payer: Self-pay | Admitting: Neurosurgery

## 2023-04-17 DIAGNOSIS — T8130XA Disruption of wound, unspecified, initial encounter: Secondary | ICD-10-CM | POA: Diagnosis not present

## 2023-04-17 DIAGNOSIS — E440 Moderate protein-calorie malnutrition: Secondary | ICD-10-CM | POA: Insufficient documentation

## 2023-04-17 LAB — CBC WITH DIFFERENTIAL/PLATELET
Abs Immature Granulocytes: 0.03 10*3/uL (ref 0.00–0.07)
Basophils Absolute: 0 10*3/uL (ref 0.0–0.1)
Basophils Relative: 0 %
Eosinophils Absolute: 0 10*3/uL (ref 0.0–0.5)
Eosinophils Relative: 0 %
HCT: 30.4 % — ABNORMAL LOW (ref 36.0–46.0)
Hemoglobin: 9.6 g/dL — ABNORMAL LOW (ref 12.0–15.0)
Immature Granulocytes: 1 %
Lymphocytes Relative: 14 %
Lymphs Abs: 0.8 10*3/uL (ref 0.7–4.0)
MCH: 30.7 pg (ref 26.0–34.0)
MCHC: 31.6 g/dL (ref 30.0–36.0)
MCV: 97.1 fL (ref 80.0–100.0)
Monocytes Absolute: 0.2 10*3/uL (ref 0.1–1.0)
Monocytes Relative: 4 %
Neutro Abs: 4.5 10*3/uL (ref 1.7–7.7)
Neutrophils Relative %: 81 %
Platelets: 228 10*3/uL (ref 150–400)
RBC: 3.13 MIL/uL — ABNORMAL LOW (ref 3.87–5.11)
RDW: 15 % (ref 11.5–15.5)
WBC: 5.5 10*3/uL (ref 4.0–10.5)
nRBC: 0 % (ref 0.0–0.2)

## 2023-04-17 LAB — BASIC METABOLIC PANEL
Anion gap: 8 (ref 5–15)
BUN: 13 mg/dL (ref 8–23)
CO2: 22 mmol/L (ref 22–32)
Calcium: 8.7 mg/dL — ABNORMAL LOW (ref 8.9–10.3)
Chloride: 108 mmol/L (ref 98–111)
Creatinine, Ser: 0.7 mg/dL (ref 0.44–1.00)
GFR, Estimated: >60 mL/min (ref >60)
Glucose, Bld: 146 mg/dL — ABNORMAL HIGH (ref 70–99)
Potassium: 3.9 mmol/L (ref 3.5–5.1)
Sodium: 138 mmol/L (ref 135–145)

## 2023-04-17 LAB — AEROBIC/ANAEROBIC CULTURE W GRAM STAIN (SURGICAL/DEEP WOUND)

## 2023-04-17 MED ORDER — ENOXAPARIN SODIUM 40 MG/0.4ML IJ SOSY
40.0000 mg | PREFILLED_SYRINGE | INTRAMUSCULAR | Status: DC
  Administered 2023-04-17 – 2023-04-23 (×7): 40 mg via SUBCUTANEOUS
  Filled 2023-04-17 (×7): qty 0.4

## 2023-04-17 NOTE — [place] FL2 (Signed)
[place] MEDICAID FL2 LEVEL OF CARE FORM     IDENTIFICATION  Patient Name: Sheryl Michael Birthdate: 1930-11-08 Sex: female Admission Date (Current Location): 04/14/2023  [facility] and [place] Number:  Chiropodist and Address:  [facility], [address], [place], [place] [address]      Provider Number: [id number]  Attending Physician Name and Address:  Tresa Moore, MD  Relative Name and Phone Number:  [name] Daughter [phone]    Current Level of Care: Hospital Recommended Level of Care: Skilled Nursing Facility Prior Approval Number:    Date Approved/Denied:   PASRR Number: [id number]  Discharge Plan: SNF    Current Diagnoses: Patient Active Problem List   Diagnosis Date Noted   Wound dehiscence 04/14/2023   Visit for wound check 04/14/2023   Dementia with behavioral disturbance (HCC) 04/14/2023   COVID-19 virus infection 03/30/2023   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   Kyphosis 03/19/2023   Closed tricolumnar fracture of thoracic vertebra (HCC) 03/19/2023   Thoracic spine fracture (HCC) 03/18/2023   Inadequate pain control 03/17/2023   Closed T11 fracture (HCC) 03/17/2023   Depression 03/17/2023   Genetic testing 05/04/2021   History of breast cancer 08/27/2020   Family history of breast cancer    Carcinoma of lower-outer quadrant of left breast in female, estrogen receptor positive (HCC) 07/23/2020   Recurrent UTI 05/05/2020   Acute pyelonephritis 05/05/2020   Acute kidney injury superimposed on chronic kidney disease (HCC) 05/05/2020   History of recurrent UTIs 03/07/2020   Sepsis (HCC) 03/05/2020   Strep throat 12/19/2018   Allergic reaction 12/19/2018   Sprain of interphalangeal joint of right ring finger 07/30/2018   Traumatic complete tear of left rotator cuff 07/16/2018   Strain of left hip 07/16/2018   Encounter for Hemoccult screening 03/12/2018   Hypothyroidism, acquired 07/10/2017   Arrhythmia  05/31/2017   Atrial fibrillation with RVR (HCC) 05/30/2017   Dyspnea 05/30/2017   Rotator cuff tendinitis, right 12/05/2016   Shingles 12/29/2015   S/P VH (vaginal hysterectomy) 12/22/2015   Cystocele 12/21/2015   Colon polyp 10/07/2015   Bladder cystocele 10/07/2015   Lower esophageal ring 10/07/2015   H/O neoplasm 08/17/2015   History of nonmelanoma skin cancer 08/17/2015   Malignant neoplasm of breast (HCC) 04/01/2014   GERD (gastroesophageal reflux disease) 04/01/2014   HLD (hyperlipidemia) 04/01/2014   OP (osteoporosis) 04/01/2014   Temporary cerebral vascular dysfunction 04/01/2014   H/O gastrointestinal disease 02/28/2014    Orientation RESPIRATION BLADDER Height & Weight     Self, Place    Incontinent, External catheter Weight: 56.7 kg Height:  5\' 1"  (154.9 cm)  BEHAVIORAL SYMPTOMS/MOOD NEUROLOGICAL BOWEL NUTRITION STATUS   (None)  (NOne) Continent Diet (Regular)  AMBULATORY STATUS COMMUNICATION OF NEEDS Skin     Verbally Surgical wounds, Bruising (wound Vac)                       Personal Care Assistance Level of Assistance  Bathing, Feeding, Dressing Bathing Assistance: Maximum assistance Feeding assistance: Limited assistance Dressing Assistance: Maximum assistance     Functional Limitations Info  Sight, Hearing, Speech Sight Info: Adequate Hearing Info: Adequate Speech Info: Adequate    SPECIAL CARE FACTORS FREQUENCY  PT (By licensed PT), OT (By licensed OT)     PT Frequency: 5 times per week OT Frequency: 5 times per week            Contractures Contractures Info: Not present  Additional Factors Info  Code Status, Allergies Code Status Info: DNR Allergies Info: Bactrim (Sulfamethoxazole-trimethoprim), Iodinated Contrast Media, Codeine, Erythromycin Ethylsuccinate, Phenobarbital, Ciprofloxacin, Penicillins           Current Medications (04/17/2023):  This is the current hospital active medication list Current Facility-Administered  Medications  Medication Dose Route Frequency Provider Last Rate Last Admin   acetaminophen (TYLENOL) tablet 650 mg  650 mg Oral Q6H PRN Venetia Night, MD   650 mg at 04/16/23 2002   Or   acetaminophen (TYLENOL) suppository 650 mg  650 mg Rectal Q6H PRN Venetia Night, MD       albuterol (PROVENTIL) (2.5 MG/3ML) 0.083% nebulizer solution 2.5 mg  2.5 mg Nebulization Q6H PRN Venetia Night, MD       anastrozole (ARIMIDEX) tablet 1 mg  1 mg Oral Daily Venetia Night, MD   1 mg at 04/15/23 0955   atorvastatin (LIPITOR) tablet 40 mg  40 mg Oral Daily Venetia Night, MD   40 mg at 04/15/23 0956   ceFEPIme (MAXIPIME) 2 g in sodium chloride 0.9 % 100 mL IVPB  2 g Intravenous Q12H Venetia Night, MD 200 mL/hr at 04/16/23 2220 2 g at 04/16/23 2220   cholecalciferol (VITAMIN D3) 25 MCG (1000 UNIT) tablet 500 Units  500 Units Oral Daily Venetia Night, MD   500 Units at 04/15/23 0955   diltiazem (CARDIZEM CD) 24 hr capsule 180 mg  180 mg Oral Daily Venetia Night, MD   180 mg at 04/15/23 0955   docusate sodium (COLACE) capsule 100 mg  100 mg Oral BID Venetia Night, MD   100 mg at 04/16/23 2224   enoxaparin (LOVENOX) injection 40 mg  40 mg Subcutaneous Q24H Sreenath, Sudheer B, MD       escitalopram (LEXAPRO) tablet 10 mg  10 mg Oral Daily Venetia Night, MD   10 mg at 04/15/23 0956   feeding supplement (ENSURE ENLIVE / ENSURE PLUS) liquid 237 mL  237 mL Oral BID BM Venetia Night, MD   237 mL at 04/16/23 1350   levothyroxine (SYNTHROID) tablet 50 mcg  50 mcg Oral Q0600 Venetia Night, MD   50 mcg at 04/17/23 0503   memantine (NAMENDA) tablet 5 mg  5 mg Oral BID Venetia Night, MD   5 mg at 04/16/23 2224   multivitamin with minerals tablet 1 tablet  1 tablet Oral Daily Lolita Patella B, MD   1 tablet at 04/16/23 1755   nutrition supplement (JUVEN) (JUVEN) powder packet 1 packet  1 packet Oral BID BM Sreenath, Sudheer B, MD       ondansetron (ZOFRAN)  tablet 4 mg  4 mg Oral Q6H PRN Venetia Night, MD       oxyCODONE (Oxy IR/ROXICODONE) immediate release tablet 2.5-5 mg  2.5-5 mg Oral Q4H PRN Venetia Night, MD   2.5 mg at 04/17/23 0503   pantoprazole (PROTONIX) EC tablet 40 mg  40 mg Oral Daily Venetia Night, MD   40 mg at 04/15/23 0955   polyethylene glycol (MIRALAX / GLYCOLAX) packet 17 g  17 g Oral Daily PRN Venetia Night, MD       sodium chloride flush (NS) 0.9 % injection 3 mL  3 mL Intravenous Q12H Venetia Night, MD   3 mL at 04/16/23 2227   vancomycin (VANCOREADY) IVPB 750 mg/150 mL  750 mg Intravenous Q24H Venetia Night, MD         Discharge Medications: Please see discharge summary for a list of discharge medications.  Relevant  Imaging Results:  Relevant Lab Results:   Additional Information SS#: [id number]  Marlowe Sax, RN

## 2023-04-17 NOTE — Progress Notes (Signed)
Physical Therapy Treatment Patient Details Name: Sheryl Michael MRN: [id number] DOB: [birth year] Today's Date: 04/17/2023   History of Present Illness Sheryl Michael presented with a wound dehiscence after a prior spine surgery.   She was brought back to the emergency department for evaluation.  She had no evidence of infection at that time.  Due to wound dehiscence, irrigation and debridement with reclosure was recommended. Pt underwent Irrigation and debridement of the thoracic and lumbar wound on 04/16/2023; has two drainage.    PT Comments  Pt resting in bed upon PT arrival; pt agreeable to therapy; pt pre-medicated with pain meds.  Mid back pain 0/10 at rest beginning of session and 3/10 at rest end of session.  During session pt min to mod assist with bed mobility via logrolling; min assist with transfers using RW; and CGA to min assist to ambulate 80 feet with RW use (2nd assist for safety).  Pt c/o R LE "numbness" during ambulation but later pt reporting R LE just feeling "weak" (not "numb") and does this intermittently when walking (B LE strength and sensation appearing symmetrical).  Will continue to focus on strengthening and progressive functional mobility during hospitalization.    Assistance Recommended at Discharge Frequent or constant Supervision/Assistance  If plan is discharge home, recommend the following:  Can travel by private vehicle    A lot of help with walking and/or transfers;A lot of help with bathing/dressing/bathroom;Assistance with cooking/housework;Direct supervision/assist for medications management;Assist for transportation;Help with stairs or ramp for entrance   No  Equipment Recommendations  Rolling walker (2 wheels)    Recommendations for Other Services       Precautions / Restrictions Precautions Precautions: Back;Fall Precaution Comments: Wound vac and hemovac Spinal Brace Comments: Per neurosurgery note 6/29 do not wear brace Restrictions Weight  Bearing Restrictions: No     Mobility  Bed Mobility Overal bed mobility: Needs Assistance Bed Mobility: Rolling, Sidelying to Sit Rolling: Min guard (vc's for technique) Sidelying to sit: Min assist, Mod assist (assist for trunk)       General bed mobility comments: vc's for technique    Transfers Overall transfer level: Needs assistance Equipment used: Rolling walker (2 wheels) Transfers: Sit to/from Stand Sit to Stand: Min assist           General transfer comment: vc's for UE placement; assist to initiate stand and control descent sitting    Ambulation/Gait Ambulation/Gait assistance: Min guard, Min assist, +2 safety/equipment Gait Distance (Feet): 80 Feet Assistive device: Rolling walker (2 wheels)   Gait velocity: decreased     General Gait Details: partial step through gait pattern; narrow BOS; mild decreased stance time R LE   Stairs             Wheelchair Mobility     Tilt Bed    Modified Rankin (Stroke Patients Only)       Balance Overall balance assessment: Needs assistance Sitting-balance support: No upper extremity supported, Feet supported Sitting balance-Leahy Scale: Good Sitting balance - Comments: steady reaching within BOS   Standing balance support: Bilateral upper extremity supported, Reliant on assistive device for balance Standing balance-Leahy Scale: Fair Standing balance comment: steady static standing with B UE support on RW                            Cognition Arousal/Alertness: Awake/alert Behavior During Therapy: WFL for tasks assessed/performed, Flat affect Overall Cognitive Status: No family/caregiver present to determine baseline  cognitive functioning Area of Impairment: Following commands, Awareness                       Following Commands: Follows one step commands with increased time     Problem Solving: Slow processing          Exercises      General Comments General comments  (skin integrity, edema, etc.): hemovac and wound vac lines/leads intact beginning/end of session.  Nursing cleared pt for participation in physical therapy.  Pt agreeable to PT session.      Pertinent Vitals/Pain Pain Assessment Pain Assessment: 0-10 Pain Score: 3  Pain Location: mid back Pain Descriptors / Indicators: Grimacing, Guarding, Aching Pain Intervention(s): Limited activity within patient's tolerance, Monitored during session, Premedicated before session, Repositioned    Home Living                          Prior Function            PT Goals (current goals can now be found in the care plan section) Acute Rehab PT Goals Patient Stated Goal: "I want to be like it was before. Stop hurting and do my thing." PT Goal Formulation: With patient Time For Goal Achievement: 04/30/23 Potential to Achieve Goals: Good Progress towards PT goals: Progressing toward goals    Frequency    7X/week      PT Plan Current plan remains appropriate    Co-evaluation              AM-PAC PT "6 Clicks" Mobility   Outcome Measure  Help needed turning from your back to your side while in a flat bed without using bedrails?: None Help needed moving from lying on your back to sitting on the side of a flat bed without using bedrails?: A Little Help needed moving to and from a bed to a chair (including a wheelchair)?: A Little Help needed standing up from a chair using your arms (e.g., wheelchair or bedside chair)?: A Little Help needed to walk in hospital room?: A Little Help needed climbing 3-5 steps with a railing? : A Lot 6 Click Score: 18    End of Session Equipment Utilized During Treatment: Gait belt Activity Tolerance: Patient tolerated treatment well Patient left: in chair;with call bell/phone within reach;with chair alarm set;Other (comment) (B heels floating via pillow support) Nurse Communication: Mobility status;Precautions PT Visit Diagnosis: Difficulty in  walking, not elsewhere classified (R26.2);Other abnormalities of gait and mobility (R26.89);Muscle weakness (generalized) (M62.81);Other symptoms and signs involving the nervous system (R29.898)     Time: 1510-1530 PT Time Calculation (min) (ACUTE ONLY): 20 min  Charges:    $Therapeutic Activity: 8-22 mins PT General Charges $$ ACUTE PT VISIT: 1 Visit                    Hendricks Limes, PT 04/17/23, 3:56 PM

## 2023-04-17 NOTE — TOC Progression Note (Signed)
Transition of Care [facility]) - Progression Note    Patient Details  Name: Sheryl Michael MRN: [id number] Date of Birth: 08/03/1931  [facility] CM/SW Contact  Marlowe Sax, RN Phone Number: 04/17/2023, 2:03 PM  Clinical Narrative:     Received a call from Debbie the patient's daughter I explained that [facility] will not be able to make a bed offer today and they will check again tomorrow, I Explained there are no bed offers at this time but I reached out to other facilities and asked them to review for bed offers, They did not pay the bed hold cost  Expected Discharge Plan: Skilled Nursing Facility Barriers to Discharge: SNF Pending bed offer, Insurance Authorization  Expected Discharge Plan and Services   Discharge Planning Services: CM Consult   Living arrangements for the past 2 months: Independent Living Facility                   DME Agency: NA       HH Arranged: NA HH Agency: NA         Social Determinants of Health (SDOH) Interventions SDOH Screenings   Food Insecurity: No Food Insecurity (04/15/2023)  Housing: Low Risk  (04/15/2023)  Transportation Needs: No Transportation Needs (04/15/2023)  Utilities: Not At Risk (04/15/2023)  Tobacco Use: Low Risk  (04/17/2023)    Readmission Risk Interventions     No data to display

## 2023-04-17 NOTE — Telephone Encounter (Signed)
-----   Message from Venetia Night, MD sent at 04/17/2023  2:33 PM EDT ----- Regarding: RE: post op appts Jsut keep her follow up with me  Yes xrays on 7/16  ----- Message ----- From: Sharlot Gowda, RN Sent: 04/17/2023   1:12 PM EDT To: Venetia Night, MD Subject: post op appts                                  She is already scheduled with you on 7/16 for her post op appt from the first surgery. Are we leaving that? Or canceling it and starting all post op appts from yesterday?  If we are leaving it, then I'm assuming you want xrays on 7/16?

## 2023-04-17 NOTE — Progress Notes (Signed)
Occupational Therapy Treatment Patient Details Name: Sheryl Michael MRN: [id number] DOB: 1930/12/19 Today's Date: 04/17/2023   History of present illness [name] presented with a wound dehiscence after a prior spine surgery.   She was brought back to the emergency department for evaluation.  She had no evidence of infection at that time.  Due to wound dehiscence, irrigation and debridement with reclosure was recommended. Pt underwent .  Irrigation and debridement of the thoracic and lumbar wound on 04/16/2023 has two drainage.   OT comments  Pt seen for OT treatment on this date. Upon arrival to room pt seated in recliner, agreeable to tx. Pt requires MIN A for transfer and ambulation with RW. Pt reported consistent pain on 5-6/10. Pt walked for >22ft, but displayed difficulty following directional instructions, requiring extra time and trials to maneuver through busy environment. Pt making good progress toward goals, will continue to follow POC. Discharge recommendation remains appropriate.     Recommendations for follow up therapy are one component of a multi-disciplinary discharge planning process, led by the attending physician.  Recommendations may be updated based on patient status, additional functional criteria and insurance authorization.    Assistance Recommended at Discharge Frequent or constant Supervision/Assistance  Patient can return home with the following  A little help with walking and/or transfers;A lot of help with bathing/dressing/bathroom;Assistance with cooking/housework;Assist for transportation;Help with stairs or ramp for entrance   Equipment Recommendations  BSC/3in1    Recommendations for Other Services      Precautions / Restrictions Precautions Precautions: Back;Fall Spinal Brace Comments: per neurosurgery note 6/29 do not wear brace Restrictions Weight Bearing Restrictions: No       Mobility Bed Mobility Overal bed mobility: Needs Assistance Bed  Mobility: Sit to Supine       Sit to supine: Min assist   General bed mobility comments: Pt able to return self to bed with guidance of hands to bed rails    Transfers Overall transfer level: Needs assistance Equipment used: Rolling walker (2 wheels) Transfers: Sit to/from Stand Sit to Stand: Min assist     Step pivot transfers: Min guard           Balance Overall balance assessment: Needs assistance Sitting-balance support: Bilateral upper extremity supported Sitting balance-Leahy Scale: Good     Standing balance support: Bilateral upper extremity supported Standing balance-Leahy Scale: Fair                             ADL either performed or assessed with clinical judgement   ADL Overall ADL's : Needs assistance/impaired                                     Functional mobility during ADLs: Minimal assistance;Rolling walker (2 wheels) (Pt demonstrated ability adequate ability to transfer to bathroom with MIN A and RW.)      Extremity/Trunk Assessment Upper Extremity Assessment Upper Extremity Assessment: Overall WFL for tasks assessed   Lower Extremity Assessment Lower Extremity Assessment: Overall WFL for tasks assessed        Vision       Perception     Praxis      Cognition Arousal/Alertness: Awake/alert Behavior During Therapy: WFL for tasks assessed/performed, Flat affect Overall Cognitive Status: No family/caregiver present to determine baseline cognitive functioning Area of Impairment: Following commands, Awareness  Orientation Level: Disoriented to, Situation     Following Commands: Follows one step commands with increased time     Problem Solving: Slow processing          Exercises      Shoulder Instructions       General Comments      Pertinent Vitals/ Pain       Pain Assessment Pain Assessment: 0-10 Pain Score: 5  Pain Location: MId back Pain Descriptors / Indicators:  Grimacing, Guarding, Aching Pain Intervention(s): Repositioned, Monitored during session  Home Living                                          Prior Functioning/Environment              Frequency  Min 2X/week        Progress Toward Goals  OT Goals(current goals can now be found in the care plan section)  Progress towards OT goals: Progressing toward goals  Acute Rehab OT Goals Patient Stated Goal: To decrease pain OT Goal Formulation: With patient Time For Goal Achievement: 04/30/23 Potential to Achieve Goals: Good ADL Goals Pt Will Perform Grooming: with modified independence;standing;sitting Pt Will Perform Lower Body Dressing: with modified independence;sit to/from stand;with adaptive equipment Pt Will Transfer to Toilet: with modified independence;ambulating Pt Will Perform Toileting - Clothing Manipulation and hygiene: with modified independence;sit to/from stand  Plan Discharge plan remains appropriate    Co-evaluation                 AM-PAC OT "6 Clicks" Daily Activity     Outcome Measure   Help from another person eating meals?: A Little Help from another person taking care of personal grooming?: A Little Help from another person toileting, which includes using toliet, bedpan, or urinal?: A Lot Help from another person bathing (including washing, rinsing, drying)?: A Lot Help from another person to put on and taking off regular upper body clothing?: A Little Help from another person to put on and taking off regular lower body clothing?: A Lot 6 Click Score: 15    End of Session Equipment Utilized During Treatment: Rolling walker (2 wheels)  OT Visit Diagnosis: Other abnormalities of gait and mobility (R26.89);Muscle weakness (generalized) (M62.81);History of falling (Z91.81) Pain - part of body:  (Back)   Activity Tolerance Patient tolerated treatment well   Patient Left with call bell/phone within reach;in bed;with bed alarm  set   Nurse Communication          Time: 1359-1411 OT Time Calculation (min): 12 min  Charges: OT General Charges $OT Visit: 1 Visit OT Treatments $Self Care/Home Management : 8-22 mins  Thresa Ross, OTS

## 2023-04-17 NOTE — Progress Notes (Signed)
PROGRESS NOTE    Sheryl Michael  NWG:[id number] DOB: April 08, 1931 DOA: 04/14/2023 PCP: Marguarite Arbour, MD    Brief Narrative:  89 y.o. female with medical history significant of T8-L2 posterior spinal fusion with T8-9 and L1-2 cement augmentation done on March 21, 2023.  Indication seems to have been increasing angulation of L1 acute fracture that was diagnosed before the procedure at approximately Mar 17 2023.  Patient was subsequently discharged on April 05, 2023.   [facility].  History obtained from daughter.  Daughter details that patient has not been eating or mobilizing well.  Is on fairly high-dose narcotics.  Admitted for wound dehiscence and concern for infection  6/29: Case discussed in detail with neurosurgery Dr. Marcell Barlow.  I agree that patient may benefit from return to OR for wound closure and possible VAC placement.  Wound does not appear obviously infected however healing may be difficult without reexploration wound.  6/30: S/p wound exploration.  Vac placed.  No obv sign of infection  Assessment & Plan:   Principal Problem:   Wound dehiscence Active Problems:   Atrial fibrillation with RVR (HCC)   Visit for wound check   Dementia with behavioral disturbance [facility])  Surgical wound dehiscence Patient is 3 weeks post T8 L2 posterior spinal fusion.  Procedure done on 6/4.  Wound does not appear obviously infected however is dehisced Status post wound closure on 6/30 Plan: Discussed with neurosurgery.  Continue empiric antibiotics for now.  Culture taken in OR.  Pending, no growth to date.  DC IV fluids.  Encourage p.o. fluid intake.  Continue multimodal pain regimen.  Minimize use of narcotics.  Okay for PT OT.  No need for back brace when ambulating.  Atrial fibrillation with rapid ventricular response Rate control improved.  Continue IV fluids.   Continue home diltiazem Continue holding Xarelto for 7 days postoperatively.   Okay for VTE prophylaxis  Dementia with behavioral disturbances PTA Namenda and Lexapro  Hypothyroidism PTA Synthroid  Suspected malnutrition RD consultation Supplemental shakes  Hyperlipidemia Lipitor  GERD PPI    DVT prophylaxis: SQ Lovenox Code Status: DNR, discussed with family Family Communication: 2 family members at bedside 6/29, 6/30 Disposition Plan: Status is: Inpatient Remains inpatient appropriate because: Wound dehiscence status post surgery       Level of care: Telemetry Medical  Consultants:  Neurosurgery  Procedures:  None  Antimicrobials: Cefepime Vancomycin    Subjective: Seen and examined.  Resting in bed.  No visible distress.  No family present bedside this morning.  Objective: Vitals:   04/17/23 0014 04/17/23 0015 04/17/23 0600 04/17/23 0832  BP: 134/67   (!) 142/46  Pulse: (!) 125 100 60 62  Resp: 18   16  Temp: 98.2 F (36.8 C)   97.9 F (36.6 C)  TempSrc:      SpO2: 99%   98%  Weight:      Height:        Intake/Output Summary (Last 24 hours) at 04/17/2023 1051 Last data filed at 04/17/2023 0904 Gross per 24 hour  Intake 948.61 ml  Output 415 ml  Net 533.61 ml   Filed Weights   04/14/23 1930  Weight: 56.7 kg    Examination:  General exam: NAD.  Appears fatigued and frail Respiratory system: Lungs clear.  Normal work of breathing.  Room air Cardiovascular system: S1-S2, RRR, no murmurs, no pedal edema Gastrointestinal system: Thin, soft, NT/ND, normal bowel sounds Central nervous system: Alert and oriented.  No focal neurological deficits. Extremities: Symmetric 5 x 5 power. Skin: No rashes, lesions or ulcers Psychiatry: Judgement and insight appear impaired. Mood & affect flattened.     Data Reviewed: I have personally reviewed following labs and imaging studies  CBC: Recent Labs  Lab 04/14/23 1957 04/15/23 0538 04/17/23 0957  WBC 6.7 5.9 5.5  NEUTROABS 4.6  --  4.5  HGB 11.6* 10.5* 9.6*  HCT 36.5  33.6* 30.4*  MCV 97.6 98.8 97.1  PLT 252 255 228   Basic Metabolic Panel: Recent Labs  Lab 04/14/23 1959 04/15/23 0538 04/17/23 0957  NA 133* 139 138  K 4.4 4.0 3.9  CL 101 109 108  CO2 19* 24 22  GLUCOSE 141* 87 146*  BUN 17 15 13   CREATININE 1.00 0.78 0.70  CALCIUM 8.6* 8.6* 8.7*  PHOS 4.5  --   --    GFR: Estimated Creatinine Clearance: 36 mL/min (by C-G formula based on SCr of 0.7 mg/dL). Liver Function Tests: Recent Labs  Lab 04/14/23 1959  AST 33  ALT 26  ALKPHOS 99  BILITOT 1.0  PROT 6.8  ALBUMIN 2.7*   No results for input(s): "LIPASE", "AMYLASE" in the last 168 hours. No results for input(s): "AMMONIA" in the last 168 hours. Coagulation Profile: Recent Labs  Lab 04/15/23 0538  INR 1.2   Cardiac Enzymes: No results for input(s): "CKTOTAL", "CKMB", "CKMBINDEX", "TROPONINI" in the last 168 hours. BNP (last 3 results) No results for input(s): "PROBNP" in the last 8760 hours. HbA1C: No results for input(s): "HGBA1C" in the last 72 hours. CBG: No results for input(s): "GLUCAP" in the last 168 hours. Lipid Profile: No results for input(s): "CHOL", "HDL", "LDLCALC", "TRIG", "CHOLHDL", "LDLDIRECT" in the last 72 hours. Thyroid Function Tests: No results for input(s): "TSH", "T4TOTAL", "FREET4", "T3FREE", "THYROIDAB" in the last 72 hours. Anemia Panel: Recent Labs    04/15/23 0538  VITAMINB12 892  FOLATE 13.2  FERRITIN 358*  TIBC 199*  IRON 43  RETICCTPCT 2.3   Sepsis Labs: Recent Labs  Lab 04/14/23 1957 04/15/23 0041 04/15/23 0538  PROCALCITON  --   --  <0.10  LATICACIDVEN 1.8 1.8  --     Recent Results (from the past 240 hour(s))  Aerobic/Anaerobic Culture w Gram Stain (surgical/deep wound)     Status: None (Preliminary result)   Collection Time: 04/14/23  7:57 PM   Specimen: Incision  Result Value Ref Range Status   Specimen Description   Final    INCISION Performed at [facility], [address], [place], [place]  [address]    Special Requests   Final    NONE Performed at [facility], [address] [place], [place] [address]    Gram Stain NO WBC SEEN NO ORGANISMS SEEN   Final   Culture   Final    RARE PSEUDOMONAS AERUGINOSA FEW STAPHYLOCOCCUS AUREUS SUSCEPTIBILITIES TO FOLLOW Performed at [facility], [address], [place], [place] [address]    Report Status PENDING  Incomplete  Blood Culture (routine x 2)     Status: None (Preliminary result)   Collection Time: 04/14/23  7:57 PM   Specimen: BLOOD  Result Value Ref Range Status   Specimen Description BLOOD BLOOD LEFT HAND  Final   Special Requests   Final    BOTTLES DRAWN AEROBIC AND ANAEROBIC Blood Culture results may not be optimal due to an inadequate volume of blood received in culture bottles   Culture   Final  NO GROWTH 3 DAYS Performed at [facility], [address] [place], [place] [address]    Report Status PENDING  Incomplete  Blood Culture (routine x 2)     Status: None (Preliminary result)   Collection Time: 04/14/23  8:01 PM   Specimen: BLOOD  Result Value Ref Range Status   Specimen Description BLOOD BLOOD RIGHT ARM  Final   Special Requests   Final    BOTTLES DRAWN AEROBIC AND ANAEROBIC Blood Culture results may not be optimal due to an excessive volume of blood received in culture bottles   Culture   Final    NO GROWTH 3 DAYS Performed at [facility], [address], [place], [place] [address]    Report Status PENDING  Incomplete  Aerobic/Anaerobic Culture w Gram Stain (surgical/deep wound)     Status: None (Preliminary result)   Collection Time: 04/16/23  8:41 AM   Specimen: PATH Soft tissue  Result Value Ref Range Status   Specimen Description   Final    WOUND Performed at [facility], [address], [place], [place] [address]    Special Requests   Final    NONE Performed at [facility], [address] [place], [place] [address]    Gram Stain NO  WBC SEEN NO ORGANISMS SEEN   Final   Culture   Final    NO GROWTH < 24 HOURS Performed at [facility], [address], [place], [place] [address]    Report Status PENDING  Incomplete         Radiology Studies: No results found.      Scheduled Meds:  anastrozole  1 mg Oral Daily   atorvastatin  40 mg Oral Daily   cholecalciferol  500 Units Oral Daily   diltiazem  180 mg Oral Daily   docusate sodium  100 mg Oral BID   enoxaparin (LOVENOX) injection  40 mg Subcutaneous Q24H   escitalopram  10 mg Oral Daily   feeding supplement  237 mL Oral BID BM   levothyroxine  50 mcg Oral Q0600   memantine  5 mg Oral BID   multivitamin with minerals  1 tablet Oral Daily   nutrition supplement (JUVEN)  1 packet Oral BID BM   pantoprazole  40 mg Oral Daily   sodium chloride flush  3 mL Intravenous Q12H   Continuous Infusions:  ceFEPime (MAXIPIME) IV 2 g (04/16/23 2220)   vancomycin       LOS: 1 day      Tresa Moore, MD [facility]   If 7PM-7AM, please contact night-coverage  04/17/2023, 10:51 AM

## 2023-04-17 NOTE — Progress Notes (Signed)
   Neurosurgery Progress Note  History: Sheryl Michael is a 89 y.o presenting with wound dehiscence s/p washout and closer.   POD1: NEAO  Physical Exam: Vitals:   04/17/23 0015 04/17/23 0600  BP:    Pulse: 100 60  Resp:    Temp:    SpO2:      Drowsy but arouses easily CNI  Strength: MAEW HV 15 since surgery  Data:  Other tests/results: wound cultures pending   Assessment/Plan:  Sheryl Michael is an 89 y.o presenting from outside facility with wound dehiscence s/p washout and closer.  - mobilize - pain control; avoid oversedation - ok for DVT prophylaxis - PTOT; stressed the importance of ambulating today - continue HV and wound vac  Manning Charity PA-C Department of Neurosurgery

## 2023-04-17 NOTE — Plan of Care (Signed)
[name]&Ox4, from home, independent in room  

## 2023-04-17 NOTE — TOC Progression Note (Addendum)
Transition of Care [facility]) - Progression Note    Patient Details  Name: Sheryl Michael MRN: [id number] Date of Birth: 06-Nov-1930  Transition of Care [facility]) CM/SW Contact  Marlowe Sax, RN Phone Number: 04/17/2023, 10:32 AM  Clinical Narrative:     Spoke with the patient's daughter [name], She stated that [facility] and [facility] but [facility], I explained the process and she is agreeable to a bed search wanting to stay local,  I reached out to [facility] to see if they have a bed available, sent referral thru the portal FL2 completed, PASSR obtained       Expected Discharge Plan and Services                                               Social Determinants of Health (SDOH) Interventions SDOH Screenings   Food Insecurity: No Food Insecurity (04/15/2023)  Housing: Low Risk  (04/15/2023)  Transportation Needs: No Transportation Needs (04/15/2023)  Utilities: Not At Risk (04/15/2023)  Tobacco Use: Low Risk  (04/17/2023)    Readmission Risk Interventions     No data to display

## 2023-04-17 NOTE — Plan of Care (Signed)

## 2023-04-17 NOTE — TOC Progression Note (Signed)
Transition of Care [facility]) - Progression Note    Patient Details  Name: Sheryl Michael MRN: [id number] Date of Birth: Aug 07, 1931  Transition of Care [facility]) CM/SW Contact  Marlowe Sax, RN Phone Number: 04/17/2023, 9:29 AM  Clinical Narrative:    Called son Perlie Gold, Left a general VM asking for a call back        Expected Discharge Plan and Services                                               Social Determinants of Health (SDOH) Interventions SDOH Screenings   Food Insecurity: No Food Insecurity (04/15/2023)  Housing: Low Risk  (04/15/2023)  Transportation Needs: No Transportation Needs (04/15/2023)  Utilities: Not At Risk (04/15/2023)  Tobacco Use: Low Risk  (04/17/2023)    Readmission Risk Interventions     No data to display

## 2023-04-17 NOTE — Progress Notes (Signed)
   04/17/23 1700  Spiritual Encounters  Type of Visit Initial  Care provided to: Patient  Reason for visit Routine spiritual support  OnCall Visit No  Spiritual Framework  Presenting Themes Courage hope and growth  Community/Connection Family  Patient Stress Factors None identified  Family Stress Factors None identified  Interventions  Spiritual Care Interventions Made Established relationship of care and support;Compassionate presence;Reflective listening;Prayer;Encouragement  Intervention Outcomes  Outcomes Connection to spiritual care;Awareness of support  Spiritual Care Plan  Spiritual Care Issues Still Outstanding No further spiritual care needs at this time (see row info)   Chaplain visited with patient post surgery and provided compassionate care, reflective listening and empathy.  Chaplain spiritual support services available as the need arises.

## 2023-04-17 NOTE — Progress Notes (Signed)
Nutrition Follow-up  DOCUMENTATION CODES:   Non-severe (moderate) malnutrition in context of chronic illness  INTERVENTION:   -MVI with minerals daily -Ensure Enlive po BID, each supplement provides 350 kcal and 20 grams of protein -D/c Juven  NUTRITION DIAGNOSIS:   Moderate Malnutrition related to social / environmental circumstances as evidenced by mild fat depletion, moderate fat depletion, mild muscle depletion, moderate muscle depletion.  Ongoing  GOAL:   Patient will meet greater than or equal to 90% of their needs  Progressing   MONITOR:   PO intake, Supplement acceptance  REASON FOR ASSESSMENT:   Consult Assessment of nutrition requirement/status  ASSESSMENT:   89 y.o. female admits related to wound check. PMH includes: arthritis, afib, GERD, HLD, HTN. Pt is currently receiving medical management related to wound dehiscence.  6/30- s/p Irrigation and debridement of the thoracic and lumbar wound   Reviewed I/O's: +1.6 L x 24 hours and +4.4 L since admission  Drain output: 15 ml x 24 hours  Spoke with pt, who was sitting in recliner chair at time of visit. Pt reports she is feeling fair today. PTA she acknowledges that she was residing at [facility] at rehab. Intake was variable (depending on what was served) but estimates she consumed at 50% of meals overall.   Pt unsure if she has lost weight. Pt shares her UBW is around 118#. Reviewed wt hx; wt has been stable over the past year.   Discussed importance of good meal and supplement intake to promote healing. Pt amenable to continue Ensure.   Per TOC notes, plan to d/c back to SNF once medically stable.   Medications reviewed and include vitamin D3, colace, and cardizem.   Lab Results  Component Value Date   HGBA1C 5.5 04/15/2013   PTA DM medications are none.   Labs reviewed: CBGS: 95 (inpatient orders for glycemic control are none).    NUTRITION - FOCUSED PHYSICAL EXAM:  Flowsheet Row Most  Recent Value  Orbital Region Moderate depletion  Upper Arm Region Mild depletion  Thoracic and Lumbar Region No depletion  Buccal Region No depletion  Temple Region Moderate depletion  Clavicle Bone Region Mild depletion  Clavicle and Acromion Bone Region Mild depletion  Scapular Bone Region Mild depletion  Dorsal Hand Mild depletion  Patellar Region Moderate depletion  Anterior Thigh Region Moderate depletion  Posterior Calf Region Moderate depletion  Edema (RD Assessment) None  Hair Reviewed  Eyes Reviewed  Mouth Reviewed  Skin Reviewed  Nails Reviewed       Diet Order:   Diet Order             Diet regular Fluid consistency: Thin  Diet effective now                   EDUCATION NEEDS:   Education needs have been addressed  Skin:  Skin Assessment: Skin Integrity Issues: Skin Integrity Issues:: Incisions Incisions: Medial back  Last BM:  04/17/23  Height:   Ht Readings from Last 1 Encounters:  04/14/23 5\' 1"  (1.549 m)    Weight:   Wt Readings from Last 1 Encounters:  04/14/23 56.7 kg    Ideal Body Weight:  47.7 kg  BMI:  Body mass index is 23.62 kg/m.  Estimated Nutritional Needs:   Kcal:  1450-1650  Protein:  65-80 grams  Fluid:  > 1.4 L    Levada Schilling, RD, LDN, CDCES Registered Dietitian II Certified Diabetes Care and Education Specialist Please refer to [facility] for  RD and/or RD on-call/weekend/after hours pager

## 2023-04-18 DIAGNOSIS — T8130XA Disruption of wound, unspecified, initial encounter: Secondary | ICD-10-CM | POA: Diagnosis not present

## 2023-04-18 LAB — AEROBIC/ANAEROBIC CULTURE W GRAM STAIN (SURGICAL/DEEP WOUND): Gram Stain: NONE SEEN

## 2023-04-18 LAB — CULTURE, BLOOD (ROUTINE X 2)

## 2023-04-18 LAB — THYROID PANEL WITH TSH
Free Thyroxine Index: 1.8 (ref 1.2–4.9)
T3 Uptake Ratio: 32 % (ref 24–39)
T4, Total: 5.6 ug/dL (ref 4.5–12.0)
TSH: 2.93 u[IU]/mL (ref 0.450–4.500)

## 2023-04-18 NOTE — Progress Notes (Signed)
PROGRESS NOTE    Sheryl Michael  ZOX:[id number] DOB: 12-09-1930 DOA: 04/14/2023 PCP: Marguarite Arbour, MD    Brief Narrative:  89 y.o. female with medical history significant of T8-L2 posterior spinal fusion with T8-9 and L1-2 cement augmentation done on March 21, 2023.  Indication seems to have been increasing angulation of L1 acute fracture that was diagnosed before the procedure at approximately Mar 17 2023.  Patient was subsequently discharged on April 05, 2023.   [facility]  History obtained from daughter.  Daughter details that patient has not been eating or mobilizing well.  Is on fairly high-dose narcotics.  Admitted for wound dehiscence and concern for infection  6/29: Case discussed in detail with neurosurgery Dr. Marcell Barlow.  I agree that patient may benefit from return to OR for wound closure and possible VAC placement.  Wound does not appear obviously infected however healing may be difficult without reexploration wound.  6/30: S/p wound exploration.  Vac placed.  No obv sign of infection 7/1: Attempted to eat more but oral intake still remains suboptimal.  TOC looking for alternative placement options  Assessment & Plan:   Principal Problem:   Wound dehiscence Active Problems:   Atrial fibrillation with RVR (HCC)   Visit for wound check   Dementia with behavioral disturbance (HCC)   Malnutrition of moderate degree  Surgical wound dehiscence Patient is 3 weeks post T8 L2 posterior spinal fusion.  Procedure done on 6/4.  Wound does not appear obviously infected however is dehisced Status post wound closure on 6/30 Plan: Discussed with neurosurgery.  Continue empiric antibiotics for now.  Culture taken in OR.  Pending, no growth to date.  Continue encouraging p.o. intake.  Continue multimodal pain regimen.  Minimize use of narcotics.  Continue therapy evaluations.  This per neurosurgery VAC will stay on until Friday 7/5.  Should have  a disposition plan by then.  Will plan to keep in the hospital until that point.   Atrial fibrillation with rapid ventricular response Rate control improved.   Continue home diltiazem Continue holding Xarelto for 7 days postoperatively.   Okay for VTE prophylaxis, SQ Lovenox  Dementia with behavioral disturbances PTA Namenda and Lexapro  Hypothyroidism PTA Synthroid  Suspected malnutrition RD consultation Supplemental shakes  Hyperlipidemia Lipitor  GERD PPI    DVT prophylaxis: SQ Lovenox Code Status: DNR, discussed with family Family Communication: 2 family members at bedside 6/29, 6/30 Disposition Plan: Status is: Inpatient Remains inpatient appropriate because: Wound dehiscence status post surgery       Level of care: Telemetry Medical  Consultants:  Neurosurgery  Procedures:  None  Antimicrobials: Cefepime Vancomycin    Subjective: Seen and examined.  Sitting up in chair.  Appears lethargic but in no visible distress.  Alert and oriented x 4. Objective: Vitals:   04/17/23 0600 04/17/23 0832 04/17/23 2320 04/18/23 0732  BP:  (!) 142/46 (!) 141/49 (!) 154/47  Pulse: 60 62 60 (!) 54  Resp:  16 18 16   Temp:  97.9 F (36.6 C) (!) 97.4 F (36.3 C) 98 F (36.7 C)  TempSrc:      SpO2:  98% 97% 96%  Weight:      Height:        Intake/Output Summary (Last 24 hours) at 04/18/2023 1156 Last data filed at 04/18/2023 8119 Gross per 24 hour  Intake 362.36 ml  Output 505 ml  Net -142.64 ml   Filed Weights   04/14/23 1930  Weight:  56.7 kg    Examination:  General exam: NAD.  Frail-appearing Respiratory system: Lungs clear.  Normal work of breathing.  Room air Cardiovascular system: S1-S2, RRR, no murmurs, no pedal edema Gastrointestinal system: Thin, soft, NT/ND, normal bowel sounds Central nervous system: Alert and oriented. No focal neurological deficits. Extremities: Symmetric 5 x 5 power. Skin: No rashes, lesions or ulcers Psychiatry:  Judgement and insight appear impaired. Mood & affect flattened.     Data Reviewed: I have personally reviewed following labs and imaging studies  CBC: Recent Labs  Lab 04/14/23 1957 04/15/23 0538 04/17/23 0957  WBC 6.7 5.9 5.5  NEUTROABS 4.6  --  4.5  HGB 11.6* 10.5* 9.6*  HCT 36.5 33.6* 30.4*  MCV 97.6 98.8 97.1  PLT 252 255 228   Basic Metabolic Panel: Recent Labs  Lab 04/14/23 1959 04/15/23 0538 04/17/23 0957  NA 133* 139 138  K 4.4 4.0 3.9  CL 101 109 108  CO2 19* 24 22  GLUCOSE 141* 87 146*  BUN 17 15 13   CREATININE 1.00 0.78 0.70  CALCIUM 8.6* 8.6* 8.7*  PHOS 4.5  --   --    GFR: Estimated Creatinine Clearance: 36 mL/min (by C-G formula based on SCr of 0.7 mg/dL). Liver Function Tests: Recent Labs  Lab 04/14/23 1959  AST 33  ALT 26  ALKPHOS 99  BILITOT 1.0  PROT 6.8  ALBUMIN 2.7*   No results for input(s): "LIPASE", "AMYLASE" in the last 168 hours. No results for input(s): "AMMONIA" in the last 168 hours. Coagulation Profile: Recent Labs  Lab 04/15/23 0538  INR 1.2   Cardiac Enzymes: No results for input(s): "CKTOTAL", "CKMB", "CKMBINDEX", "TROPONINI" in the last 168 hours. BNP (last 3 results) No results for input(s): "PROBNP" in the last 8760 hours. HbA1C: No results for input(s): "HGBA1C" in the last 72 hours. CBG: No results for input(s): "GLUCAP" in the last 168 hours. Lipid Profile: No results for input(s): "CHOL", "HDL", "LDLCALC", "TRIG", "CHOLHDL", "LDLDIRECT" in the last 72 hours. Thyroid Function Tests: No results for input(s): "TSH", "T4TOTAL", "FREET4", "T3FREE", "THYROIDAB" in the last 72 hours. Anemia Panel: No results for input(s): "VITAMINB12", "FOLATE", "FERRITIN", "TIBC", "IRON", "RETICCTPCT" in the last 72 hours.  Sepsis Labs: Recent Labs  Lab 04/14/23 1957 04/15/23 0041 04/15/23 0538  PROCALCITON  --   --  <0.10  LATICACIDVEN 1.8 1.8  --     Recent Results (from the past 240 hour(s))  Aerobic/Anaerobic  Culture w Gram Stain (surgical/deep wound)     Status: None (Preliminary result)   Collection Time: 04/14/23  7:57 PM   Specimen: Incision  Result Value Ref Range Status   Specimen Description   Final    INCISION Performed at [facility], [address], [place], [place] [address]    Special Requests   Final    NONE Performed at [facility], [address] [place], [place] [address]    Gram Stain   Final    NO WBC SEEN NO ORGANISMS SEEN Performed at [facility], [address], [place], [place] [address]    Culture   Final    RARE PSEUDOMONAS AERUGINOSA FEW METHICILLIN RESISTANT STAPHYLOCOCCUS AUREUS NO ANAEROBES ISOLATED; CULTURE IN PROGRESS FOR 5 DAYS    Report Status PENDING  Incomplete   Organism ID, Bacteria PSEUDOMONAS AERUGINOSA  Final   Organism ID, Bacteria METHICILLIN RESISTANT STAPHYLOCOCCUS AUREUS  Final      Susceptibility   Methicillin resistant staphylococcus aureus - MIC*  CIPROFLOXACIN 4 RESISTANT Resistant     ERYTHROMYCIN >=8 RESISTANT Resistant     GENTAMICIN <=0.5 SENSITIVE Sensitive     OXACILLIN >=4 RESISTANT Resistant     TETRACYCLINE <=1 SENSITIVE Sensitive     VANCOMYCIN <=0.5 SENSITIVE Sensitive     TRIMETH/SULFA <=10 SENSITIVE Sensitive     CLINDAMYCIN <=0.25 SENSITIVE Sensitive     RIFAMPIN <=0.5 SENSITIVE Sensitive     Inducible Clindamycin NEGATIVE Sensitive     LINEZOLID 2 SENSITIVE Sensitive     * FEW METHICILLIN RESISTANT STAPHYLOCOCCUS AUREUS   Pseudomonas aeruginosa - MIC*    CEFTAZIDIME 4 SENSITIVE Sensitive     CIPROFLOXACIN 0.5 SENSITIVE Sensitive     GENTAMICIN <=1 SENSITIVE Sensitive     IMIPENEM <=0.25 SENSITIVE Sensitive     PIP/TAZO 8 SENSITIVE Sensitive     CEFEPIME 2 SENSITIVE Sensitive     * RARE PSEUDOMONAS AERUGINOSA  Blood Culture (routine x 2)     Status: None (Preliminary result)   Collection Time: 04/14/23  7:57 PM   Specimen: BLOOD  Result Value Ref Range Status   Specimen  Description BLOOD BLOOD LEFT HAND  Final   Special Requests   Final    BOTTLES DRAWN AEROBIC AND ANAEROBIC Blood Culture results may not be optimal due to an inadequate volume of blood received in culture bottles   Culture   Final    NO GROWTH 4 DAYS Performed at [id number], [address], [place], [place] [address]    Report Status PENDING  Incomplete  Blood Culture (routine x 2)     Status: None (Preliminary result)   Collection Time: 04/14/23  8:01 PM   Specimen: BLOOD  Result Value Ref Range Status   Specimen Description BLOOD BLOOD RIGHT ARM  Final   Special Requests   Final    BOTTLES DRAWN AEROBIC AND ANAEROBIC Blood Culture results may not be optimal due to an excessive volume of blood received in culture bottles   Culture   Final    NO GROWTH 4 DAYS Performed at [facility], [address] [place], [place] [address]    Report Status PENDING  Incomplete  Aerobic/Anaerobic Culture w Gram Stain (surgical/deep wound)     Status: None (Preliminary result)   Collection Time: 04/16/23  8:41 AM   Specimen: PATH Soft tissue  Result Value Ref Range Status   Specimen Description   Final    WOUND Performed at [facility], [address], [place], [place] [address]    Special Requests   Final    NONE Performed at [facility], [address] [place], [place] [address]    Gram Stain NO WBC SEEN NO ORGANISMS SEEN   Final   Culture   Final    NO GROWTH 2 DAYS Performed at [facility], [address] [place], [place] [address]    Report Status PENDING  Incomplete         Radiology Studies: No results found.      Scheduled Meds:  anastrozole  1 mg Oral Daily   atorvastatin  40 mg Oral Daily   cholecalciferol  500 Units Oral Daily   diltiazem  180 mg Oral Daily   docusate sodium  100 mg Oral BID   enoxaparin (LOVENOX) injection  40 mg Subcutaneous Q24H   escitalopram  10 mg Oral Daily   feeding supplement  237 mL  Oral BID BM   levothyroxine  50 mcg Oral  Q0600   memantine  5 mg Oral BID   multivitamin with minerals  1 tablet Oral Daily   nutrition supplement (JUVEN)  1 packet Oral BID BM   pantoprazole  40 mg Oral Daily   sodium chloride flush  3 mL Intravenous Q12H   Continuous Infusions:  ceFEPime (MAXIPIME) IV 2 g (04/18/23 1000)   vancomycin 750 mg (04/18/23 1107)     LOS: 2 days      Tresa Moore, MD [facility]   If 7PM-7AM, please contact night-coverage  04/18/2023, 11:56 AM

## 2023-04-18 NOTE — Progress Notes (Signed)
Physical Therapy Treatment Patient Details Name: Sheryl Michael MRN: [id number] DOB: [birth year] Today's Date: 04/18/2023   History of Present Illness Sheryl Michael presented with a wound dehiscence after a prior spine surgery.   She was brought back to the emergency department for evaluation.  She had no evidence of infection at that time.  Due to wound dehiscence, irrigation and debridement with reclosure was recommended. Pt underwent Irrigation and debridement of the thoracic and lumbar wound on 04/16/2023; has two drainage.    PT Comments  Pt in recliner just finishing taking vitals with nursing upon entry. Pt very lethargic and reports 4/10 pain in her back. She initially didn't feel up for mobility but with encouragement was agreeable to trying to stand and move around. Pt required min guard for STS from recliner with VC's for LE placement. Pt able to ambulate ~15 feet with min guard and RW in the room before needing to get back to bed; no instances of LOB or instability. Pt was making comments on her back pain continuously throughout the session (pt premedicated beforehand); ambulation distance likely limited due to pain and lethargy. Pt returned to supine in bed Max A + 2 with all needs in reach. Vitals remained WNL throughout session, with O2 at 98% and HR mid 70's. Pt will benefit from continued PT services upon discharge to safely address deficits listed in patient problem list for decreased caregiver assistance and eventual return to PLOF.     Assistance Recommended at Discharge Frequent or constant Supervision/Assistance  If plan is discharge home, recommend the following:  Can travel by private vehicle    A lot of help with walking and/or transfers;A lot of help with bathing/dressing/bathroom;Assistance with cooking/housework;Direct supervision/assist for medications management;Assist for transportation;Help with stairs or ramp for entrance   No  Equipment Recommendations  Rolling  walker (2 wheels)    Recommendations for Other Services       Precautions / Restrictions Precautions Precautions: Back;Fall Precaution Comments: Wound vac and hemovac Spinal Brace Comments: Per neurosurgery note 6/29 do not wear brace Restrictions Weight Bearing Restrictions: No     Mobility  Bed Mobility Overal bed mobility: Needs Assistance Bed Mobility: Rolling, Sit to Supine Rolling: Min guard     Sit to supine: Max assist, +2 for physical assistance   General bed mobility comments: Pt able to use bed rails to roll to R side with no extra assistance needed. Max A + 2 sit-sup at end of session for LE/trunk management.    Transfers Overall transfer level: Needs assistance Equipment used: Rolling walker (2 wheels) Transfers: Sit to/from Stand Sit to Stand: Min guard           General transfer comment: VC's for LE placement. Min guard and RW for safety. Increased time to come to standing but pt able to do so without physical assistance.    Ambulation/Gait Ambulation/Gait assistance: Min guard Gait Distance (Feet): 15 Feet Assistive device: Rolling walker (2 wheels) Gait Pattern/deviations: Decreased step length - left, Decreased step length - right Gait velocity: decreased     General Gait Details: Slow but consistent cadence throughout. No instances of LOB or legs buckling. Amb. distance limited by pain.   Stairs             Wheelchair Mobility     Tilt Bed    Modified Rankin (Stroke Patients Only)       Balance Overall balance assessment: Needs assistance Sitting-balance support: No upper extremity supported, Feet supported  Sitting balance-Leahy Scale: Good     Standing balance support: Bilateral upper extremity supported, Reliant on assistive device for balance, During functional activity Standing balance-Leahy Scale: Fair                              Cognition Arousal/Alertness: Lethargic Behavior During Therapy: WFL for  tasks assessed/performed, Flat affect Overall Cognitive Status: No family/caregiver present to determine baseline cognitive functioning                                 General Comments: Pt lethargic throughout session but willing to participate and put forth good effort.        Exercises      General Comments        Pertinent Vitals/Pain Pain Assessment Pain Assessment: 0-10 Pain Score: 4  Pain Location: mid back Pain Intervention(s): Monitored during session, Limited activity within patient's tolerance, Repositioned, Premedicated before session    Home Living                          Prior Function            PT Goals (current goals can now be found in the care plan section) Progress towards PT goals: Progressing toward goals    Frequency    7X/week      PT Plan Current plan remains appropriate    Co-evaluation              AM-PAC PT "6 Clicks" Mobility   Outcome Measure  Help needed turning from your back to your side while in a flat bed without using bedrails?: A Little Help needed moving from lying on your back to sitting on the side of a flat bed without using bedrails?: A Little Help needed moving to and from a bed to a chair (including a wheelchair)?: A Little Help needed standing up from a chair using your arms (e.g., wheelchair or bedside chair)?: None Help needed to walk in hospital room?: A Little Help needed climbing 3-5 steps with a railing? : A Lot 6 Click Score: 18    End of Session Equipment Utilized During Treatment: Gait belt Activity Tolerance: Patient limited by lethargy;Patient limited by pain Patient left: in bed;with call bell/phone within reach;with bed alarm set Nurse Communication: Mobility status;Precautions PT Visit Diagnosis: Difficulty in walking, not elsewhere classified (R26.2);Other abnormalities of gait and mobility (R26.89);Muscle weakness (generalized) (M62.81);Other symptoms and signs  involving the nervous system (R29.898)     Time: 1610-9604 PT Time Calculation (min) (ACUTE ONLY): 22 min  Charges:                            Mayfield Schoene, SPT 04/18/23, 4:16 PM

## 2023-04-18 NOTE — TOC Progression Note (Signed)
Transition of Care [facility]) - Progression Note    Patient Details  Name: Sheryl Michael MRN: [id number] Date of Birth: September 04, 1931  Transition of Care [facility]) CM/SW Contact  Marlowe Sax, RN Phone Number: 04/18/2023, 10:03 AM  Clinical Narrative:     No local bed offers, I called Debbie the daughter, and explained that there are no bed offers locally, Debbie asked if they could take her home with 24 hour care, I explained that they could do that if they choose I explained that there are a few companies that offer PCS services  She will speak to her family and see what they would like, she did ask to expand the bed search to the [place] area, I explained we do not have many in that area in the portal and that if they had specific ones in mind I could reach out to them, she will ask her sister that lives in that area and let me know but asked that I reach out thru the portal for now  Expected Discharge Plan: Skilled Nursing Facility Barriers to Discharge: SNF Pending bed offer, Insurance Authorization  Expected Discharge Plan and Services   Discharge Planning Services: CM Consult   Living arrangements for the past 2 months: Independent Living Facility                   DME Agency: NA       HH Arranged: NA HH Agency: NA         Social Determinants of Health (SDOH) Interventions SDOH Screenings   Food Insecurity: No Food Insecurity (04/15/2023)  Housing: Low Risk  (04/15/2023)  Transportation Needs: No Transportation Needs (04/15/2023)  Utilities: Not At Risk (04/15/2023)  Tobacco Use: Low Risk  (04/17/2023)    Readmission Risk Interventions    04/17/2023    2:11 PM  Readmission Risk Prevention Plan  Transportation Screening Complete  PCP or Specialist Appt within 3-5 Days Complete  HRI or Home Care Consult Complete  Social Work Consult for Recovery Care Planning/Counseling Complete  Palliative Care Screening Not Applicable  Medication Review Ocean[facility]) Referral to  Pharmacy

## 2023-04-18 NOTE — Plan of Care (Signed)
  Problem: Activity: Goal: Risk for activity intolerance will decrease Outcome: Progressing   Problem: Nutrition: Goal: Adequate nutrition will be maintained Outcome: Progressing   Problem: Pain Managment: Goal: General experience of comfort will improve Outcome: Progressing   Problem: Safety: Goal: Ability to remain free from injury will improve Outcome: Progressing   Problem: Skin Integrity: Goal: Risk for impaired skin integrity will decrease Outcome: Progressing   

## 2023-04-18 NOTE — Plan of Care (Signed)

## 2023-04-18 NOTE — Progress Notes (Signed)
   Neurosurgery Progress Note  History: Sheryl Michael is a 89 y.o presenting with wound dehiscence s/p washout and closer.   POD2: Patient is up in her bedside chair this morning.  She denies any significant back pain. POD1: NEAO  Physical Exam: Vitals:   04/17/23 2320 04/18/23 0732  BP: (!) 141/49 (!) 154/47  Pulse: 60 (!) 54  Resp: 18 16  Temp: (!) 97.4 F (36.3 C) 98 F (36.7 C)  SpO2: 97% 96%    Drowsy but arouses easily CNI  Strength: MAEW HV 5 overnight   Data:  Other tests/results: wound cultures pending   Assessment/Plan:  Sheryl Michael is an 89 y.o presenting from outside facility with wound dehiscence s/p washout and closer.  - mobilize - pain control; avoid oversedation - ok for DVT prophylaxis - PTOT; stressed the importance of ambulating today - continue HV and wound vac  Manning Charity PA-C Department of Neurosurgery

## 2023-04-19 DIAGNOSIS — T8130XA Disruption of wound, unspecified, initial encounter: Secondary | ICD-10-CM | POA: Diagnosis not present

## 2023-04-19 LAB — CREATININE, SERUM
Creatinine, Ser: 0.64 mg/dL (ref 0.44–1.00)
GFR, Estimated: >60 mL/min (ref >60)

## 2023-04-19 LAB — CULTURE, BLOOD (ROUTINE X 2): Culture: NO GROWTH

## 2023-04-19 MED ORDER — TUBERCULIN PPD 5 UNIT/0.1ML ID SOLN
5.0000 [IU] | Freq: Once | INTRADERMAL | Status: AC
  Administered 2023-04-19: 5 [IU] via INTRADERMAL
  Filled 2023-04-19: qty 0.1

## 2023-04-19 MED ORDER — MIRTAZAPINE 15 MG PO TABS: 7.5000 mg | ORAL_TABLET | Freq: Every day | ORAL | Status: DC

## 2023-04-19 NOTE — Progress Notes (Signed)
Progress Note   Patient: Sheryl Michael ZOX:[id number] DOB: 21-Mar-1931 DOA: 04/14/2023     3 DOS: the patient was seen and examined on 04/19/2023   Brief hospital course:  89 y.o. female with medical history significant of T8-L2 posterior spinal fusion with T8-9 and L1-2 cement augmentation done on March 21, 2023.  Indication seems to have been increasing angulation of L1 acute fracture that was diagnosed before the procedure at approximately Mar 17 2023.  Patient was subsequently discharged on April 05, 2023.    [facility]  History obtained from daughter.  Daughter details that patient has not been eating or mobilizing well.  Is on fairly high-dose narcotics.   Admitted for wound dehiscence and concern for infection   6/29: Case discussed in detail with neurosurgery Dr. Marcell Barlow.  I agree that patient may benefit from return to OR for wound closure and possible VAC placement.  Wound does not appear obviously infected however healing may be difficult without reexploration wound.   6/30: S/p wound exploration.  Vac placed.  No obv sign of infection 7/1: Attempted to eat more but oral intake still remains suboptimal.  TOC looking for alternative placement options 7/3 : Seen and examined during rounds.  Very flat affect.  Continues to have very poor oral intake.    Assessment and Plan:  Principal Problem:   Wound dehiscence Active Problems:   Atrial fibrillation with RVR (HCC)   Visit for wound check   Dementia with behavioral disturbance (HCC)   Malnutrition of moderate degree   Surgical wound dehiscence Patient is 3 weeks post T8 L2 posterior spinal fusion.  Procedure done on 6/4.  Wound does not appear obviously infected however is dehisced Status post wound closure on 6/30 Discussed with neurosurgery.  Continue empiric antibiotics for now.   Culture taken in OR.  Pending, no growth to date.  Continue encouraging p.o. intake.  Continue  multimodal pain regimen.  Minimize use of narcotics.  Continue therapy evaluations.  This per neurosurgery VAC will stay on until Friday 7/5.  Should have a disposition plan by then.  Will plan to keep in the hospital until that point.    Atrial fibrillation with rapid ventricular response Rate control improved.   Continue home diltiazem Continue holding Xarelto for 7 days postoperatively.  07/07 Okay for VTE prophylaxis, SQ Lovenox   Dementia with behavioral disturbances Continue Namenda and Lexapro Will add Remeron Consult psychiatry   Hypothyroidism Continue Synthroid   Moderate malnutrition Moderate Malnutrition related to social / environmental circumstances as evidenced by mild fat depletion, moderate fat depletion, mild muscle depletion, moderate muscle depletion.  Appreciate dietitian input Continue MVI with minerals daily Continue Ensure Enlive po BID, each supplement provides 350 kcal and 20 grams of protein -D/c Juven    Hyperlipidemia Lipitor    GERD PPI       Subjective: Patient is seen and examined at the bedside.  Has a very flat affect.  Physical Exam: Vitals:   04/18/23 0732 04/18/23 1535 04/18/23 2352 04/19/23 0807  BP: (!) 154/47 (!) 137/37 114/66 (!) 139/52  Pulse: (!) 54 63 71 91  Resp: 16 14 14 16   Temp: 98 F (36.7 C) 98.1 F (36.7 C) 98 F (36.7 C) 98.4 F (36.9 C)  TempSrc:   Oral   SpO2: 96% 96% 92% 97%  Weight:      Height:       General exam: NAD.  Frail-appearing Respiratory system: Lungs clear.  Normal work of breathing.  Room air Cardiovascular system: S1-S2, RRR, no murmurs, no pedal edema Gastrointestinal system: Thin, soft, NT/ND, normal bowel sounds Central nervous system: Alert and oriented. No focal neurological deficits. Extremities: Symmetric 5 x 5 power. Skin: No rashes, lesions or ulcers Psychiatry: Judgement and insight appear impaired. Mood & affect flattened.   Data Reviewed: Relevant notes from primary care  and specialist visits, past discharge summaries as available in EHR, including Care Everywhere. Prior diagnostic testing as pertinent to current admission diagnoses Updated medications and problem lists for reconciliation ED course, including vitals, labs, imaging, treatment and response to treatment Triage notes, nursing and pharmacy notes and ED provider's notes Notable results as noted in HPI There are no new results to review at this time.  Family Communication: None  Disposition: Status is: Inpatient Remains inpatient appropriate because: Has a wound VAC in place.  Neurosurgery recommends wound VAC in place for 7 days prior to discussing disposition.  Planned Discharge Destination: Skilled nursing facility    Time spent: 35 minutes  Author: Lucile Shutters, MD 04/19/2023 2:33 PM  For on call review [email or web address].

## 2023-04-19 NOTE — Progress Notes (Addendum)
Physical Therapy Treatment Patient Details Name: Sheryl Michael MRN: [id number] DOB: [birth year] Today's Date: 04/19/2023   History of Present Illness Sheryl Michael presented with a wound dehiscence after a prior spine surgery.   She was brought back to the emergency department for evaluation.  She had no evidence of infection at that time.  Due to wound dehiscence, irrigation and debridement with reclosure was recommended. Pt underwent Irrigation and debridement of the thoracic and lumbar wound on 04/16/2023; prior spinal surgery (03/20/2023).    PT Comments  Pt lying in bed upon arrival. She is still lethargic and has hard time keeping her eyes open throughout the session, but agreeable to trying to move around some with PT. Pt able to perform sup-sit with a little assistance from PT for trunk/LE management; she needed occasional cues for assistance with UE/LE placement. Pt was able to stand from bed height with no physical assistance needed, just increased time and effort as well as VC's for proper LE positioning. Pt able to take a few side-steps towards [facility] with min guard for safety, shortly after pt reported feeling dizzy so she was returned to bed max A + 2. Her BP at the start of the session in supine was 126/54, this dropped to 87/62 when checked immediately after returning her to supine in bed. Pt was given a few more minutes to rest before BP was retaken, with it at 107/60. O2 remained mid to high 90's throughout; her HR was giving off inconsistent readings throughout the session, ranging anywhere from 70's to 110's, nursing notified. Pt will benefit from continued PT services upon discharge to safely address deficits listed in patient problem list for decreased caregiver assistance and eventual return to PLOF.     Assistance Recommended at Discharge Frequent or constant Supervision/Assistance  If plan is discharge home, recommend the following:  Can travel by private vehicle    A lot of help  with walking and/or transfers;A lot of help with bathing/dressing/bathroom;Assistance with cooking/housework;Direct supervision/assist for medications management;Assist for transportation;Help with stairs or ramp for entrance   No  Equipment Recommendations  Rolling walker (2 wheels)    Recommendations for Other Services       Precautions / Restrictions Precautions Precautions: Back;Fall Precaution Comments: Hemovac Spinal Brace Comments: Per neurosurgery note 6/29 do not wear brace Restrictions Weight Bearing Restrictions: No     Mobility  Bed Mobility Overal bed mobility: Needs Assistance Bed Mobility: Rolling, Supine to Sit, Sit to Supine Rolling: Min guard   Supine to sit: Min assist, HOB elevated Sit to supine: Max assist, +2 for physical assistance   General bed mobility comments: Pt able to use bed rails to roll to R side with no extra assistance needed. Min A for elevation and LE management with sup-sit. Max A + 2 sit-sup at end of session for LE/trunk management.    Transfers Overall transfer level: Needs assistance Equipment used: Rolling walker (2 wheels) Transfers: Sit to/from Stand Sit to Stand: Min guard           General transfer comment: VC's for LE placement. Min guard and RW for safety. Increased time to come to standing but pt able to do so without physical assistance.    Ambulation/Gait Ambulation/Gait assistance: Min guard Gait Distance (Feet): 3 Feet Assistive device: Rolling walker (2 wheels) Gait Pattern/deviations: Decreased step length - left, Decreased step length - right Gait velocity: decreased     General Gait Details: Pt able to take several side steps  with RW to [facility]. Amb. distance continues to be limited by pain. Occasional VC's for proper RW use/placement.   Stairs             Wheelchair Mobility     Tilt Bed    Modified Rankin (Stroke Patients Only)       Balance Overall balance assessment: Needs  assistance Sitting-balance support: No upper extremity supported, Feet supported Sitting balance-Leahy Scale: Good     Standing balance support: Bilateral upper extremity supported, Reliant on assistive device for balance, During functional activity Standing balance-Leahy Scale: Fair                              Cognition Arousal/Alertness: Lethargic Behavior During Therapy: WFL for tasks assessed/performed, Flat affect Overall Cognitive Status: No family/caregiver present to determine baseline cognitive functioning                                 General Comments: Pt lethargic throughout session but willing to participate and put forth good effort.        Exercises      General Comments        Pertinent Vitals/Pain Pain Assessment Pain Assessment: 0-10 Pain Score: 4  Pain Location: mid back Pain Intervention(s): Premedicated before session, Repositioned, Monitored during session, Limited activity within patient's tolerance    Home Living                          Prior Function            PT Goals (current goals can now be found in the care plan section) Progress towards PT goals: Progressing toward goals    Frequency    Min 3X/week      PT Plan Frequency needs to be updated    Co-evaluation              AM-PAC PT "6 Clicks" Mobility   Outcome Measure  Help needed turning from your back to your side while in a flat bed without using bedrails?: A Little Help needed moving from lying on your back to sitting on the side of a flat bed without using bedrails?: A Little Help needed moving to and from a bed to a chair (including a wheelchair)?: A Little Help needed standing up from a chair using your arms (e.g., wheelchair or bedside chair)?: None Help needed to walk in hospital room?: A Little Help needed climbing 3-5 steps with a railing? : A Lot 6 Click Score: 18    End of Session Equipment Utilized During  Treatment: Gait belt Activity Tolerance: Patient limited by lethargy;Patient limited by pain Patient left: in bed;with call bell/phone within reach;with bed alarm set Nurse Communication: Mobility status;Precautions PT Visit Diagnosis: Difficulty in walking, not elsewhere classified (R26.2);Other abnormalities of gait and mobility (R26.89);Muscle weakness (generalized) (M62.81);Other symptoms and signs involving the nervous system (R29.898)     Time: 1610-9604 PT Time Calculation (min) (ACUTE ONLY): 45 min  Charges:                            Timothy Crutchfield, SPT 04/19/23, 12:56 PM This entire session was performed under direct supervision and direction of a licensed therapist/therapist assistant. I have personally read, edited and approve of the note as written. Loran Senters, DPT

## 2023-04-19 NOTE — Consult Note (Addendum)
Patient seen and briefly assessed, granddaughter Verlon Au at bedside. Introduced self and explained reason for psychiatric consult. Patient and granddaughter did not feel as if there were any imminent needs or concerns that warrant inpatient psychiatric consult at this time. Patient denies suicidal ideation. Interview was brief, discussed home health, PMT, and SNF. Ultimately patient and family are interested in GOC family discussion, which I concur.    After chart review, it appears the patient is on Lexapro and Namenda, with the recent addition of mirtazapine, which is appropriate for addressing failure to thrive (FTT). Further review shows no significant psychotropic medication interactions contributing to her FTT and flat affect, except for oxycodone (pain is managed; she has received only 3 doses in 36 hours).  Given the limited options for patients of her age, I recommend:  Encouraging psychotherapy or chaplain services:  Inviting family or friends to engage with her There are limited further recommendations from the inpatient CL service, as FTT is typically multifactorial and not solely due to depressive symptoms. Increasing her physical therapy and addressing socioenvironmental factors within the hospital's limitations may be beneficial.  -DC Remeron, patient already is lethargic and malaise. Remeron is very sedating and will prohibit her from participating in PT.  -Recommend bowel healthy regimens considering anesthesia and opiate pain medication. Will likely aid in her appetite increase.  -Recommend PMT at family request.  -Consider other organic causes of FTT outside of age, recent surgeries, and poor nutrition intake.  - TOC for referrals to SNF that offer wound vac care or home health services.  -Consider dietician consult if patient continues to have poor appetite. Granddaughter at bedside was able to get her to eat this afternoon.   Will dc psych consult at this time.

## 2023-04-19 NOTE — Progress Notes (Cosign Needed Addendum)
  Neurosurgery Progress Note  History: Sheryl Michael is a 89 y.o presenting with wound dehiscence s/p washout and closure.    POD3: Pt has poor oral intake. Was encouraged overnight. S POD2: Patient is up in her bedside chair this morning.  She denies any significant back pain. POD1: NEAO  Physical Exam: Vitals:   04/18/23 2352 04/19/23 0807  BP: 114/66 (!) 139/52  Pulse: 71 91  Resp: 14 16  Temp: 98 F (36.7 C) 98.4 F (36.9 C)  SpO2: 92% 97%    Drowsy but arouses easily to voice CNI  Strength: MAEW HV 0 out overnight   Data:  Other tests/results: wound cultures pending. No growth x 3 days  Assessment/Plan:  Sheryl Michael is an 89 y.o presenting from outside facility with wound dehiscence s/p washout and closer.  - mobilize - pain control; avoid oversedation - ok for DVT prophylaxis - PTOT; stressed the importance of continuing to work towards mobility. - HV removed 7/3. continue wound vac  Sheryl Charity PA-C Department of Neurosurgery

## 2023-04-19 NOTE — TOC Progression Note (Addendum)
Transition of Care [facility]) - Progression Note    Patient Details  Name: Sheryl Michael MRN: [id number] Date of Birth: 1930-10-20  Transition of Care [facility]) CM/SW Contact  Marlowe Sax, RN Phone Number: 04/19/2023, 9:50 AM  Clinical Narrative:     [name] with Debbie the patient;s daughter She stated that she spoke with [name] at [facility] assisted living They want the patient to go to [facility] and gives permission to send them the information She wants the patient to transport with EMS to [facility] once medically ready to DC, sent Fl2  to [place] requested TB test to go to [facility] from [facility] to come to assess today to see if they can accept her at ALF level  Expected Discharge Plan: Skilled Nursing Facility Barriers to Discharge: SNF Pending bed offer, Insurance Authorization  Expected Discharge Plan and Services   Discharge Planning Services: CM Consult   Living arrangements for the past 2 months: Independent Living Facility                   DME Agency: NA       HH Arranged: NA HH Agency: NA         Social Determinants of Health (SDOH) Interventions SDOH Screenings   Food Insecurity: No Food Insecurity (04/15/2023)  Housing: Low Risk  (04/15/2023)  Transportation Needs: No Transportation Needs (04/15/2023)  Utilities: Not At Risk (04/15/2023)  Tobacco Use: Low Risk  (04/17/2023)    Readmission Risk Interventions    04/17/2023    2:11 PM  Readmission Risk Prevention Plan  Transportation Screening Complete  PCP or Specialist Appt within 3-5 Days Complete  HRI or Home Care Consult Complete  Social Work Consult for Recovery Care Planning/Counseling Complete  Palliative Care Screening Not Applicable  Medication Review Oceanographer) Referral to Pharmacy

## 2023-04-19 NOTE — NC FL2 (Signed)
[place] MEDICAID FL2 LEVEL OF CARE FORM     IDENTIFICATION  Patient Name: Sheryl Michael Birthdate: 11-11-1930 Sex: female Admission Date (Current Location): 04/14/2023  [facility] [place] Number:  Chiropodist and Address:  [facility], [address], [place], [place] [address]      Provider Number: [id number]  Attending Physician Name and Address:  Lucile Shutters, MD  Relative Name and Phone Number:  [name] Daughter [phone]    Current Level of Care: Hospital Recommended Level of Care: Assisted Living Facility Prior Approval Number:    Date Approved/Denied:   PASRR Number: [id number]  Discharge Plan: SNF    Current Diagnoses: Patient Active Problem List   Diagnosis Date Noted   Malnutrition of moderate degree 04/17/2023   Wound dehiscence 04/14/2023   Visit for wound check 04/14/2023   Dementia with behavioral disturbance (HCC) 04/14/2023   COVID-19 virus infection 03/30/2023   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   Kyphosis 03/19/2023   Closed tricolumnar fracture of thoracic vertebra (HCC) 03/19/2023   Thoracic spine fracture (HCC) 03/18/2023   Inadequate pain control 03/17/2023   Closed T11 fracture (HCC) 03/17/2023   Depression 03/17/2023   Genetic testing 05/04/2021   History of breast cancer 08/27/2020   Family history of breast cancer    Carcinoma of lower-outer quadrant of left breast in female, estrogen receptor positive (HCC) 07/23/2020   Recurrent UTI 05/05/2020   Acute pyelonephritis 05/05/2020   Acute kidney injury superimposed on chronic kidney disease (HCC) 05/05/2020   History of recurrent UTIs 03/07/2020   Sepsis (HCC) 03/05/2020   Strep throat 12/19/2018   Allergic reaction 12/19/2018   Sprain of interphalangeal joint of right ring finger 07/30/2018   Traumatic complete tear of left rotator cuff 07/16/2018   Strain of left hip 07/16/2018   Encounter for Hemoccult screening 03/12/2018    Hypothyroidism, acquired 07/10/2017   Arrhythmia 05/31/2017   Atrial fibrillation with RVR (HCC) 05/30/2017   Dyspnea 05/30/2017   Rotator cuff tendinitis, right 12/05/2016   Shingles 12/29/2015   S/P VH (vaginal hysterectomy) 12/22/2015   Cystocele 12/21/2015   Colon polyp 10/07/2015   Bladder cystocele 10/07/2015   Lower esophageal ring 10/07/2015   H/O neoplasm 08/17/2015   History of nonmelanoma skin cancer 08/17/2015   Malignant neoplasm of breast (HCC) 04/01/2014   GERD (gastroesophageal reflux disease) 04/01/2014   HLD (hyperlipidemia) 04/01/2014   OP (osteoporosis) 04/01/2014   Temporary cerebral vascular dysfunction 04/01/2014   H/O gastrointestinal disease 02/28/2014    Orientation RESPIRATION BLADDER Height & Weight     Self, Place  Normal Incontinent, External catheter Weight: 56.7 kg Height:  5\' 1"  (154.9 cm)  BEHAVIORAL SYMPTOMS/MOOD NEUROLOGICAL BOWEL NUTRITION STATUS   (None)  (NOne) Continent Diet (Regular)  AMBULATORY STATUS COMMUNICATION OF NEEDS Skin     Verbally Surgical wounds, Bruising                       Personal Care Assistance Level of Assistance  Bathing, Feeding, Dressing Bathing Assistance: Limited assistance Feeding assistance: Limited assistance Dressing Assistance: Maximum assistance     Functional Limitations Info  Sight, Speech, Hearing Sight Info: Adequate Hearing Info: Adequate Speech Info: Adequate    SPECIAL CARE FACTORS FREQUENCY  PT (By licensed PT), OT (By licensed OT)     PT Frequency: 3 days a week OT Frequency: 3 days a week            Contractures Contractures Info: Not present  Additional Factors Info  Code Status, Allergies Code Status Info: DNR Allergies Info: Bactrim (Sulfamethoxazole-trimethoprim), Iodinated Contrast Media, Codeine, Erythromycin Ethylsuccinate, Phenobarbital, Ciprofloxacin, Penicillins           Current Medications (04/19/2023):  This is the current hospital active medication  list Current Facility-Administered Medications  Medication Dose Route Frequency Provider Last Rate Last Admin   acetaminophen (TYLENOL) tablet 650 mg  650 mg Oral Q6H PRN Venetia Night, MD   650 mg at 04/19/23 0920   Or   acetaminophen (TYLENOL) suppository 650 mg  650 mg Rectal Q6H PRN Venetia Night, MD       albuterol (PROVENTIL) (2.5 MG/3ML) 0.083% nebulizer solution 2.5 mg  2.5 mg Nebulization Q6H PRN Venetia Night, MD       anastrozole (ARIMIDEX) tablet 1 mg  1 mg Oral Daily Venetia Night, MD   1 mg at 04/19/23 1610   atorvastatin (LIPITOR) tablet 40 mg  40 mg Oral Daily Venetia Night, MD   40 mg at 04/19/23 9604   ceFEPIme (MAXIPIME) 2 g in sodium chloride 0.9 % 100 mL IVPB  2 g Intravenous Q12H Venetia Night, MD 200 mL/hr at 04/18/23 2227 2 g at 04/18/23 2227   cholecalciferol (VITAMIN D3) 25 MCG (1000 UNIT) tablet 500 Units  500 Units Oral Daily Venetia Night, MD   500 Units at 04/19/23 5409   diltiazem (CARDIZEM CD) 24 hr capsule 180 mg  180 mg Oral Daily Venetia Night, MD   180 mg at 04/19/23 8119   docusate sodium (COLACE) capsule 100 mg  100 mg Oral BID Venetia Night, MD   100 mg at 04/19/23 0923   enoxaparin (LOVENOX) injection 40 mg  40 mg Subcutaneous Q24H Lolita Patella B, MD   40 mg at 04/19/23 0926   escitalopram (LEXAPRO) tablet 10 mg  10 mg Oral Daily Venetia Night, MD   10 mg at 04/19/23 1478   feeding supplement (ENSURE ENLIVE / ENSURE PLUS) liquid 237 mL  237 mL Oral BID BM Venetia Night, MD   237 mL at 04/19/23 0951   levothyroxine (SYNTHROID) tablet 50 mcg  50 mcg Oral Q0600 Venetia Night, MD   50 mcg at 04/19/23 0527   memantine (NAMENDA) tablet 5 mg  5 mg Oral BID Venetia Night, MD   5 mg at 04/19/23 2956   multivitamin with minerals tablet 1 tablet  1 tablet Oral Daily Lolita Patella B, MD   1 tablet at 04/19/23 2130   nutrition supplement (JUVEN) (JUVEN) powder packet 1 packet  1 packet Oral BID BM  Lolita Patella B, MD   1 packet at 04/19/23 0951   ondansetron (ZOFRAN) tablet 4 mg  4 mg Oral Q6H PRN Venetia Night, MD       oxyCODONE (Oxy IR/ROXICODONE) immediate release tablet 2.5-5 mg  2.5-5 mg Oral Q4H PRN Venetia Night, MD   5 mg at 04/18/23 1452   pantoprazole (PROTONIX) EC tablet 40 mg  40 mg Oral Daily Venetia Night, MD   40 mg at 04/19/23 8657   polyethylene glycol (MIRALAX / GLYCOLAX) packet 17 g  17 g Oral Daily PRN Venetia Night, MD       sodium chloride flush (NS) 0.9 % injection 3 mL  3 mL Intravenous Q12H Venetia Night, MD   3 mL at 04/19/23 0953   vancomycin (VANCOREADY) IVPB 750 mg/150 mL  750 mg Intravenous Q24H Venetia Night, MD 150 mL/hr at 04/18/23 1107 750 mg at 04/18/23 1107     Discharge Medications:  Please see discharge summary for a list of discharge medications.  Relevant Imaging Results:  Relevant Lab Results:   Additional Information SS#: [id number]  Marlowe Sax, RN

## 2023-04-19 NOTE — Plan of Care (Signed)
  Problem: Clinical Measurements: Goal: Signs and symptoms of infection will decrease Outcome: Progressing   Problem: Respiratory: Goal: Ability to maintain adequate ventilation will improve Outcome: Progressing   Problem: Education: Goal: Knowledge of General Education information will improve Description: Including pain rating scale, medication(s)/side effects and non-pharmacologic comfort measures Outcome: Progressing   Problem: Clinical Measurements: Goal: Will remain free from infection Outcome: Progressing   Problem: Activity: Goal: Risk for activity intolerance will decrease Outcome: Progressing   Problem: Nutrition: Goal: Adequate nutrition will be maintained Outcome: Progressing   Problem: Elimination: Goal: Will not experience complications related to bowel motility Outcome: Progressing   Problem: Elimination: Goal: Will not experience complications related to urinary retention Outcome: Progressing

## 2023-04-20 ENCOUNTER — Encounter: Payer: Self-pay | Admitting: Internal Medicine

## 2023-04-20 DIAGNOSIS — Z7189 Other specified counseling: Secondary | ICD-10-CM | POA: Diagnosis not present

## 2023-04-20 DIAGNOSIS — T8130XA Disruption of wound, unspecified, initial encounter: Secondary | ICD-10-CM | POA: Diagnosis not present

## 2023-04-20 DIAGNOSIS — Z515 Encounter for palliative care: Secondary | ICD-10-CM | POA: Diagnosis not present

## 2023-04-20 LAB — BASIC METABOLIC PANEL
Anion gap: 11 (ref 5–15)
BUN: 21 mg/dL (ref 8–23)
CO2: 21 mmol/L — ABNORMAL LOW (ref 22–32)
Calcium: 8.8 mg/dL — ABNORMAL LOW (ref 8.9–10.3)
Chloride: 105 mmol/L (ref 98–111)
Creatinine, Ser: 0.75 mg/dL (ref 0.44–1.00)
GFR, Estimated: >60 mL/min (ref >60)
Glucose, Bld: 109 mg/dL — ABNORMAL HIGH (ref 70–99)
Potassium: 3.8 mmol/L (ref 3.5–5.1)
Sodium: 137 mmol/L (ref 135–145)

## 2023-04-20 LAB — AEROBIC/ANAEROBIC CULTURE W GRAM STAIN (SURGICAL/DEEP WOUND): Gram Stain: NONE SEEN

## 2023-04-20 LAB — PHOSPHORUS: Phosphorus: 2.4 mg/dL — ABNORMAL LOW (ref 2.5–4.6)

## 2023-04-20 LAB — MAGNESIUM: Magnesium: 2.2 mg/dL (ref 1.7–2.4)

## 2023-04-20 MED ORDER — BISACODYL 10 MG RE SUPP
10.0000 mg | Freq: Once | RECTAL | Status: AC
  Administered 2023-04-20: 10 mg via RECTAL
  Filled 2023-04-20: qty 1

## 2023-04-20 NOTE — TOC Progression Note (Signed)
Transition of Care [facility]) - Progression Note    Patient Details  Name: Sheryl Michael MRN: [id number] Date of Birth: 06-02-1931  [facility] CM/SW Contact  Marlowe Sax, RN Phone Number: 04/20/2023, 10:15 AM  Clinical Narrative:    [name] with [name] at [facility] late yesterday, they will not be able to take the patient until at lest Friday, she will not be able to have a wound vac to discharge The family to go there today to sign paperwork., TB test ordered yesterday   Expected Discharge Plan: Skilled Nursing Facility Barriers to Discharge: SNF Pending bed offer, Insurance Authorization  Expected Discharge Plan and Services   Discharge Planning Services: CM Consult   Living arrangements for the past 2 months: Independent Living Facility                   DME Agency: NA       HH Arranged: NA HH Agency: NA         Social Determinants of Health (SDOH) Interventions SDOH Screenings   Food Insecurity: No Food Insecurity (04/15/2023)  Housing: Low Risk  (04/15/2023)  Transportation Needs: No Transportation Needs (04/15/2023)  Utilities: Not At Risk (04/15/2023)  Tobacco Use: Low Risk  (04/17/2023)    Readmission Risk Interventions    04/17/2023    2:11 PM  Readmission Risk Prevention Plan  Transportation Screening Complete  PCP or Specialist Appt within 3-5 Days Complete  HRI or Home Care Consult Complete  Social Work Consult for Recovery Care Planning/Counseling Complete  Palliative Care Screening Not Applicable  Medication Review [facility]) Referral to Pharmacy

## 2023-04-20 NOTE — Consult Note (Signed)
Consultation Note Date: 04/20/2023   Patient Name: Sheryl Michael  DOB: 01-05-1931  MRN: [id number]  Age / Sex: 89 y.o., female  PCP: Marguarite Arbour, MD Referring Physician: Lucile Shutters, MD  Reason for Consultation: Establishing goals of care  HPI/Patient Profile: 89 y.o. female  with past medical history of T8-L2 posterior spinal fusion with T8-9 and L1-2 cemented augmentation done 03/21/23 discharged 6/19 to [facility], HTN/HLD, recurrent UTIs/cystocele, osteoarthritis/osteoporosis, right breast cancer with lumpectomy and radiation treatment 2021, GERD, TIA, dysrhythmia/A-fib, admitted on 04/14/2023 with surgical wound dehiscence, A-fib with RVR.  From [facility].  Clinical Assessment and Goals of Care: I have reviewed medical records including EPIC notes, labs and imaging, received report from RN, assessed the patient.  Sheryl Michael is sitting up in the [place] chair in her room.  She appears acutely/chronically ill and somewhat frail.  She will briefly make but not keep eye contact.  She closes her eyes several times during our conversation.  She is oriented x 3 but there is mention of memory loss in her chart.  She initially states that June but then relatively quickly states, "no ... July".  I believe that she can make her basic needs known.  There is no family at bedside at this time.  I offer her something to drink.  She does take a sip of orange juice.  There is a slight cough afterwards.  I offer her something to eat but she declines.  I ask about her family.  She tells me that she has a son Sherrill Raring and a daughter Sheryl Michael.  She tells me that she is a widow.  I share that I will reach out to her family.  Call to daughter, Sheryl Michael, who adds daughter [name] to our phone call., to discuss diagnosis prognosis, GOC, EOL wishes, disposition and options.  I introduced Palliative Medicine as specialized medical care for  people living with serious illness. It focuses on providing relief from the symptoms and stress of a serious illness. The goal is to improve quality of life for both the patient and the family.  We discussed a brief life review of the patient.  Sheryl Michael is a widow.  She has 3 children breast, Alben Spittle.  They share that she has been through a lot over the last 5 to 6 weeks with COVID, surgery, and then wound dehiscence.  We talk about her somewhat extensive health history including, but not limited to, breast cancer, recurrent UTIs, HTN/HLD, etc.  We then focused on their current illness.  We talk about [name] acute concerns with neurosurgery consult and recommendations, wound VAC, wound cultures and treatment plan and disposition.  The natural disease trajectory and expectations at EOL were discussed.  Advanced directives, concepts specific to code status, artifical feeding and hydration, and rehospitalization were considered and discussed.  DNR verified.  Palliative Care services outpatient were explained and offered.  We talk about the benefits of outpatient palliative services.  I encouraged family to consider this support.  PMT  to follow  Discussed the importance of continued conversation with family and the medical providers regarding overall plan of care and treatment options, ensuring decisions are within the context of the patient's values and GOCs.  Questions and concerns were addressed. The family was encouraged to call with questions or concerns.  PMT will continue to support holistically.  Conference with attending, bedside nursing staff, transition of care team related to patient condition, needs, goals of care, disposition.   HCPOA NEXT OF KIN  - 3 children Sheryl Michael, and Sheryl Michael, make choices as a team.  Sheryl Michael is a widow.      SUMMARY OF RECOMMENDATIONS   At this point continue to treat the treatable but no CPR or intubation Time for outcomes.   Now positive  for MRSA and Pseudomonas Hospitalized until 7/5 due to wound VAC. Anticipate outpatient palliative services to follow.   Code Status/Advance Care Planning: DNR  Symptom Management:  Per hospitalist, no additional needs at this time.  Palliative Prophylaxis:  Frequent Pain Assessment, Palliative Wound Care, and Turn Reposition  Additional Recommendations (Limitations, Scope, Preferences): Continue to treat but no CPR or intubation.  Psycho-social/Spiritual:  Desire for further Chaplaincy support:no Additional Recommendations: Caregiving  Support/Resources  Prognosis:  Unable to determine, based on outcomes.   Discharge Planning: To Be Determined      Primary Diagnoses: Present on Admission:  Atrial fibrillation with RVR (HCC)   I have reviewed the medical record, interviewed the patient and family, and examined the patient. The following aspects are pertinent.  Past Medical History:  Diagnosis Date   Arthritis    Atrial fibrillation (HCC)    Breast cancer (HCC) 2006   right breast ca with lumpectomy and rad tx, left breast ca 2021lumpectomy and rad tx   Colon polyp    Complication of anesthesia    questions about husband who passed in 2012    Cystocele    Depression    Dysrhythmia    Family history of breast cancer    Female bladder prolapse    GERD (gastroesophageal reflux disease)    Goiter    Hyperlipemia    Hypertension    Hypothyroidism    Osteoporosis    Personal history of radiation therapy 2006   right breast ca and left breast 2021   Pneumonia    Procidentia of uterus    Recurrent UTI    Reflux    TIA (transient ischemic attack)    Vaginal atrophy    Social History   Socioeconomic History   Marital status: Widowed    Spouse name: Not on file   Number of children: Not on file   Years of education: Not on file   Highest education level: Not on file  Occupational History   Not on file  Tobacco Use   Smoking status: Never   Smokeless  tobacco: Never  Vaping Use   Vaping Use: Never used  Substance and Sexual Activity   Alcohol use: No   Drug use: No   Sexual activity: Never    Birth control/protection: Post-menopausal  Other Topics Concern   Not on file  Social History Narrative   Not on file   Social Determinants of Health   Financial Resource Strain: Not on file  Food Insecurity: No Food Insecurity (04/15/2023)   Hunger Vital Sign    Worried About Running Out of Food in the Last Year: Never true    Ran Out of Food in the Last Year: Never true  Transportation Needs: No Transportation Needs (04/15/2023)   PRAPARE - Administrator, Civil Service (Medical): No    Lack of Transportation (Non-Medical): No  Physical Activity: Not on file  Stress: Not on file  Social Connections: Not on file   Family History  Problem Relation Age of Onset   Diabetes Sister    Breast cancer Sister        late 30's   Diabetes Brother    Diabetes Sister    Scheduled Meds:  anastrozole  1 mg Oral Daily   atorvastatin  40 mg Oral Daily   cholecalciferol  500 Units Oral Daily   diltiazem  180 mg Oral Daily   docusate sodium  100 mg Oral BID   enoxaparin (LOVENOX) injection  40 mg Subcutaneous Q24H   escitalopram  10 mg Oral Daily   feeding supplement  237 mL Oral BID BM   levothyroxine  50 mcg Oral Q0600   memantine  5 mg Oral BID   multivitamin with minerals  1 tablet Oral Daily   nutrition supplement (JUVEN)  1 packet Oral BID BM   pantoprazole  40 mg Oral Daily   sodium chloride flush  3 mL Intravenous Q12H   tuberculin  5 Units Intradermal Once   Continuous Infusions:  ceFEPime (MAXIPIME) IV 2 g (04/20/23 1003)   vancomycin 750 mg (04/19/23 1154)   PRN Meds:.acetaminophen **OR** acetaminophen, albuterol, ondansetron, oxyCODONE, polyethylene glycol Medications Prior to Admission:  Prior to Admission medications   Medication Sig Start Date End Date Taking? Authorizing Provider  acetaminophen (TYLENOL) 325  MG tablet Take 650 mg by mouth every 6 (six) hours as needed (pain.).    Yes [provider]  albuterol (VENTOLIN HFA) 108 (90 Base) MCG/ACT inhaler Inhale 2 puffs into the lungs every 6 (six) hours as needed for wheezing or shortness of breath. 05/16/22  Yes Bradler, Clent Jacks, MD  anastrozole (ARIMIDEX) 1 MG tablet Take 1 tablet (1 mg total) by mouth daily. 12/21/22  Yes Earna Coder, MD  atorvastatin (LIPITOR) 40 MG tablet Take by mouth. 05/26/22 05/26/23 Yes [provider]  Calcium Carb-Cholecalciferol 600-20 MG-MCG TABS 1 tablet with a meal Orally Once a day   Yes [provider]  Cholecalciferol (VITAMIN D3) 10 MCG (400 UNIT) tablet Take 400 Units by mouth daily.   Yes [provider]  cyanocobalamin 1000 MCG tablet Take 1,000 mcg by mouth daily.   Yes [provider]  diltiazem (CARDIZEM CD) 180 MG 24 hr capsule Take 1 capsule (180 mg total) by mouth daily. 04/06/23  Yes Loyce Dys, MD  docusate sodium (COLACE) 100 MG capsule Take 1 capsule (100 mg total) by mouth 2 (two) times daily. 04/05/23  Yes Loyce Dys, MD  escitalopram (LEXAPRO) 10 MG tablet Take 10 mg by mouth daily.    Yes [provider]  Lactobacillus (PROBIOTIC ACIDOPHILUS) CAPS Take 1 capsule by mouth in the morning and at bedtime.   Yes [provider]  levothyroxine (SYNTHROID, LEVOTHROID) 50 MCG tablet Take 50 mcg by mouth daily before breakfast.   Yes [provider]  memantine (NAMENDA) 5 MG tablet Take 5 mg by mouth 2 (two) times daily.   Yes [provider]  metoprolol tartrate (LOPRESSOR) 25 MG tablet Take 0.5 tablets (12.5 mg total) by mouth 2 (two) times daily. 04/05/23  Yes Loyce Dys, MD  omeprazole (PRILOSEC) 20 MG capsule Take 20 mg by mouth daily. 01/31/20  Yes [provider]  ondansetron (ZOFRAN) 4 MG tablet Take 1 tablet (4 mg total) by mouth every 6 (six) hours as needed for nausea. 04/05/23  Yes Loyce Dys, MD   oxyCODONE 10 MG TABS Take 1 tablet (10 mg total) by mouth every 6 (six) hours as needed for severe pain. 04/05/23  Yes Loyce Dys, MD  polyethylene glycol (MIRALAX / GLYCOLAX) 17 g packet Take 17 g by mouth daily as needed for moderate constipation. 04/05/23  Yes Loyce Dys, MD  Rivaroxaban Carlena Hurl) 15 MG TABS tablet Take by mouth. 05/27/22  Yes [provider]   Allergies  Allergen Reactions   Bactrim [Sulfamethoxazole-Trimethoprim] Hives   Iodinated Contrast Media Anaphylaxis   Codeine Nausea And Vomiting   Erythromycin Ethylsuccinate Other (See Comments)    Unknown    Phenobarbital Other (See Comments)    Feeling funny, nervous   Ciprofloxacin Rash   Penicillins Rash   Review of Systems  Physical Exam  Vital Signs: BP 117/63 (BP Location: Right Arm)   Pulse 91   Temp 98.8 F (37.1 C)   Resp 17   Ht 5\' 1"  (1.549 m)   Wt 56.7 kg   SpO2 97%   BMI 23.62 kg/m  Pain Scale: 0-10 POSS *See Group Information*: 1-Acceptable,Awake and alert Pain Score: 0-No pain   SpO2: SpO2: 97 % O2 Device:SpO2: 97 % O2 Flow Rate: .   IO: Intake/output summary:  Intake/Output Summary (Last 24 hours) at 04/20/2023 1206 Last data filed at 04/20/2023 1015 Gross per 24 hour  Intake --  Output 1500 ml  Net -1500 ml    LBM: Last BM Date : 04/15/23 Baseline Weight: Weight: 56.7 kg Most recent weight: Weight: 56.7 kg     Palliative Assessment/Data:     Time In: 1100 Time Out: 1215 Time Total: 75 minutes  Greater than 50%  of this time was spent counseling and coordinating care related to the above assessment and plan.  Signed by: Katheran Awe, NP   Please contact Palliative Medicine Team phone at [phone] for questions and concerns.  For individual provider: See Loretha Stapler

## 2023-04-20 NOTE — Progress Notes (Addendum)
Progress Note   Patient: Sheryl Michael ZOX:[id number] DOB: [birth year] DOA: 04/14/2023     4 DOS: the patient was seen and examined on 04/20/2023   Brief hospital course:  89 y.o. female with medical history significant of T8-L2 posterior spinal fusion with T8-9 and L1-2 cement augmentation done on March 21, 2023.  Indication seems to have been increasing angulation of L1 acute fracture that was diagnosed before the procedure at approximately Mar 17 2023.  Patient was subsequently discharged on April 05, 2023.    [facility].  History obtained from daughter.  Daughter details that patient has not been eating or mobilizing well.  Is on fairly high-dose narcotics.   Admitted for wound dehiscence and concern for infection   6/29: Case discussed in detail with neurosurgery Dr. Marcell Barlow.  I agree that patient may benefit from return to OR for wound closure and possible VAC placement.  Wound does not appear obviously infected however healing may be difficult without reexploration wound.   6/30: S/p wound exploration.  Vac placed.  No obv sign of infection 7/1: Attempted to eat more but oral intake still remains suboptimal.  TOC looking for alternative placement options 7/3 : Seen and examined during rounds.  Very flat affect.  Continues to have very poor oral intake. 7/4: Wound culture yielded Pseudomonas and MRSA.  Patient on cefepime and vancomycin    Assessment and Plan: Principal Problem:   Wound dehiscence Active Problems:   Atrial fibrillation with RVR (HCC)   Visit for wound check   Dementia with behavioral disturbance (HCC)   Malnutrition of moderate degree   Surgical wound dehiscence Patient is 3 weeks post T8 L2 posterior spinal fusion.  Procedure done on 6/4.  Wound does not appear obviously infected however is dehisced Status post wound closure on 6/30 Wound culture from 07/04 yields MRSA and Pseudomonas Continue encouraging p.o. intake.   Continue multimodal pain regimen.  Minimize use of narcotics.  Continue therapy evaluations.  This per neurosurgery VAC will stay on until Friday 7/5.  Should have a disposition plan by then.  Will plan to keep in the hospital until that point.    Atrial fibrillation with rapid ventricular response Rate control improved.   Continue home diltiazem Continue holding Xarelto for 7 days postoperatively.  07/07 Okay for VTE prophylaxis, SQ Lovenox   Dementia with behavioral disturbances Continue Namenda and Lexapro Appreciate psychiatry input   Hypothyroidism Continue Synthroid   Moderate malnutrition Moderate Malnutrition related to social / environmental circumstances as evidenced by mild fat depletion, moderate fat depletion, mild muscle depletion, moderate muscle depletion.  Appreciate dietitian input Continue MVI with minerals daily Continue Ensure Enlive po BID, each supplement provides 350 kcal and 20 grams of protein -D/c Juven     Hyperlipidemia Lipitor     GERD PPI          Subjective: Sitting up in a recliner.  Complains of pain  Physical Exam: Vitals:   04/19/23 0807 04/19/23 1606 04/20/23 0456 04/20/23 0725  BP: (!) 139/52 (!) 114/55 (!) 124/53 117/63  Pulse: 91 75 (!) 54 91  Resp: 16 16 18 17   Temp: 98.4 F (36.9 C) 98.1 F (36.7 C) 98.8 F (37.1 C) 98.8 F (37.1 C)  TempSrc:      SpO2: 97% 96% 98% 97%  Weight:      Height:       General exam: NAD.  Frail-appearing Respiratory system: Lungs clear.  Normal work of breathing.  Room air Cardiovascular system: S1-S2, RRR, no murmurs, no pedal edema Gastrointestinal system: Thin, soft, NT/ND, normal bowel sounds Central nervous system: Lethargic but arouses easily. No focal neurological deficits. Extremities: Symmetric 5 x 5 power. Skin: No rashes, lesions or ulcers Psychiatry: Judgement and insight appear impaired. Mood & affect flattened.        Data Reviewed: Labs reviewed.  Within normal  limits There are no new results to review at this time.  Family Communication: None  Disposition: Status is: Inpatient Remains inpatient appropriate because: Awaiting discharge  Planned Discharge Destination: Skilled nursing facility    Time spent: 34 minutes  Author: Lucile Shutters, MD 04/20/2023 1:03 PM  For on call review [email or web address].

## 2023-04-20 NOTE — Plan of Care (Signed)

## 2023-04-20 NOTE — Progress Notes (Signed)
  Neurosurgery Progress Note  History: Sheryl Michael is a 89 y.o presenting with wound dehiscence s/p washout and closure.    POD4: Continues with poor PO intake, no difficulty so far with woundvac.  POD3: Pt has poor oral intake. Was encouraged overnight. S POD2: Patient is up in her bedside chair this morning.  She denies any significant back pain. POD1: NEAO  Physical Exam: Vitals:   04/20/23 0456 04/20/23 0725  BP: (!) 124/53 117/63  Pulse: (!) 54 91  Resp: 18 17  Temp: 98.8 F (37.1 C) 98.8 F (37.1 C)  SpO2: 98% 97%    Drowsy but arouses easily to voice CNI  Strength: MAEW  Data:  Other tests/results: wound cultures pending. No growth x 3 days  Assessment/Plan:  Sheryl Michael is an 89 y.o presenting from outside facility with wound dehiscence s/p washout and closer.  - mobilize - pain control; avoid oversedation, follow w/ psych recs - ok for DVT prophylaxis - PTOT; stressed the importance of continuing to work towards mobility - Encourage nutrition for wound healing - HV removed 7/3. continue wound vac  Lovenia Kim, MD

## 2023-04-21 DIAGNOSIS — R627 Adult failure to thrive: Secondary | ICD-10-CM | POA: Diagnosis not present

## 2023-04-21 DIAGNOSIS — T8130XA Disruption of wound, unspecified, initial encounter: Secondary | ICD-10-CM | POA: Diagnosis not present

## 2023-04-21 DIAGNOSIS — Z7189 Other specified counseling: Secondary | ICD-10-CM | POA: Diagnosis not present

## 2023-04-21 DIAGNOSIS — I4891 Unspecified atrial fibrillation: Secondary | ICD-10-CM | POA: Diagnosis not present

## 2023-04-21 DIAGNOSIS — Z923 Personal history of irradiation: Secondary | ICD-10-CM

## 2023-04-21 DIAGNOSIS — Z515 Encounter for palliative care: Secondary | ICD-10-CM | POA: Diagnosis not present

## 2023-04-21 DIAGNOSIS — D649 Anemia, unspecified: Secondary | ICD-10-CM | POA: Diagnosis not present

## 2023-04-21 NOTE — Progress Notes (Signed)
Nutrition Follow-up  DOCUMENTATION CODES:   Non-severe (moderate) malnutrition in context of chronic illness  INTERVENTION:   -Continue MVI with minerals daily -Continue Ensure Enlive po BID, each supplement provides 350 kcal and 20 grams of protein  NUTRITION DIAGNOSIS:   Moderate Malnutrition related to social / environmental circumstances as evidenced by mild fat depletion, moderate fat depletion, mild muscle depletion, moderate muscle depletion.  Ongoing  GOAL:   Patient will meet greater than or equal to 90% of their needs  Unmet  MONITOR:   PO intake, Supplement acceptance  REASON FOR ASSESSMENT:   Consult Assessment of nutrition requirement/status  ASSESSMENT:   89 y.o. female admits related to wound check. PMH includes: arthritis, afib, GERD, HLD, HTN. Pt is currently receiving medical management related to wound dehiscence.  6/30- s/p Irrigation and debridement of the thoracic and lumbar wound   Reviewed I/O's: -450 ml x 24 hours and +1.1 L since admission  Pt sleeping at time of visit. She did not arouse to voice. Meal tray at bedside untouched.   Spoke with RN, who reports pt with decline over the past few days. Pt lethargic and needs max encouragement to perform tasks. Pt has mainly been taking bites and sips at meals. Family brought in a biscuit yesterday for pt to eat, which she took a few bites of.   Palliative care following for goals of care; plan to allow time for outcomes.   Per TOC notes, plan to d/c back to SNF once medically stable.   Medications reviewed and include vitamin D3, cardizem, and colace.   Labs reviewed: CBGS: 95 (inpatient orders for glycemic control are none).    Diet Order:   Diet Order             Diet regular Fluid consistency: Thin  Diet effective now                   EDUCATION NEEDS:   Education needs have been addressed  Skin:  Skin Assessment: Skin Integrity Issues: Skin Integrity Issues:: Wound  VAC Wound Vac: back Incisions: Medial back  Last BM:  04/21/23 (type 7)  Height:   Ht Readings from Last 1 Encounters:  04/14/23 5\' 1"  (1.549 m)    Weight:   Wt Readings from Last 1 Encounters:  04/14/23 56.7 kg    Ideal Body Weight:  47.7 kg  BMI:  Body mass index is 23.62 kg/m.  Estimated Nutritional Needs:   Kcal:  1450-1650  Protein:  65-80 grams  Fluid:  > 1.4 L    Levada Schilling, RD, LDN, CDCES Registered Dietitian II Certified Diabetes Care and Education Specialist Please refer to [facility] for RD and/or RD on-call/weekend/after hours pager

## 2023-04-21 NOTE — Progress Notes (Addendum)
Palliative: Sheryl Michael, Sheryl Michael, is lying quietly in bed.  She appears acutely/chronically ill and quite frail.  She is resting comfortably but wakes easily when I call her name.  She will open her eyes and make eye contact when requested.  She will briefly make but not keep eye contact.  She quickly closes her eyes during our contact.  She is oriented x 3, but I am not sure that she can make her basic needs known.  There is no family at bedside at this time.  Sheryl Michael has not touched her breakfast.  Offer her a piece of bacon.  She eats 1 small bite but states "I do not want anymore".  I offer her something to drink, and she is able to take a sip without overt signs and symptoms of aspiration.  Multiple times, as she is swallowing she furrows her brow.  I ask if swallowing is causing her pain.  She denies pain.  I continue to offer her food and drink but she states that she does not want any more.  I share with Sheryl Michael that "we must eat to live".  She states that she does not want to eat.  I share that I will reach out to her daughters.  She is being followed by ID with recommendations for ciprofloxacin and linezolid for 1 week as she has received nearly a week of cefepime and vancomycin.    Call to daughter, Sheryl Michael.  Sheryl Michael states that she is unable to have a full conversation today as she is at her own doctor's appointment.  I share infectious disease consult and recommendations.  Sheryl Michael asks if Sheryl Michael would be able to stay in the hospital for another week.  I shared that this is unlikely.   Conference with attending, bedside nursing staff, transition of care team related to patient condition, needs, goals of care, disposition.  Plan: At this point continue to treat the treatable but no CPR or intubation.  Time for outcomes.  Anticipate need for extended rehab.  Would benefit from outpatient palliative services/family considering.  DNR/goldenrod form on chart.  35 minutes Sheryl Carmel,  NP Palliative medicine team Team phone [phone] Greater than 50% of this time was spent counseling and coordinating care related to the above assessment and plan.

## 2023-04-21 NOTE — Progress Notes (Signed)
Occupational Therapy Treatment Patient Details Name: Sheryl Michael MRN: [id number] DOB: Mar 15, 1931 Today's Date: 04/21/2023   History of present illness Sheryl Michael presented with a wound dehiscence after a prior spine surgery.   She was brought back to the emergency department for evaluation.  She had no evidence of infection at that time.  Due to wound dehiscence, irrigation and debridement with reclosure was recommended. Pt underwent Irrigation and debridement of the thoracic and lumbar wound on 04/16/2023; prior spinal surgery (03/20/2023).   OT comments  Sheryl Michael was seen for OT treatment on this date. Upon arrival to room pt supine in bed, notably lethargic but wakes with VCs. OT facilitated ADL management as described below. See ADL section for additional details regarding occupational performance. Pt continues to be functionally limited by lethargy, pain, decreased balance, and decreased safety awareness. Educated on back precautions and log-roll technique for bed mobility. Pt return verbalizes understanding of education provided t/o session. Pt is progressing toward OT goals and continues to benefit from skilled OT services to maximize return to PLOF and minimize risk of future falls, injury, caregiver burden, and readmission. Will continue to follow POC as written. Discharge recommendation remains appropriate.     Recommendations for follow up therapy are one component of a multi-disciplinary discharge planning process, led by the attending physician.  Recommendations may be updated based on patient status, additional functional criteria and insurance authorization.    Assistance Recommended at Discharge Frequent or constant Supervision/Assistance  Patient can return home with the following  A little help with walking and/or transfers;A lot of help with bathing/dressing/bathroom;Assistance with cooking/housework;Assist for transportation;Help with stairs or ramp for entrance   Equipment  Recommendations  BSC/3in1    Recommendations for Other Services      Precautions / Restrictions Precautions Precautions: Back;Fall Precaution Booklet Issued: Yes (comment) Precaution Comments: Hemovac Spinal Brace Comments: Per neurosurgery note 6/29 do not wear brace Restrictions Weight Bearing Restrictions: No       Mobility Bed Mobility Overal bed mobility: Needs Assistance Bed Mobility: Rolling, Sidelying to Sit Rolling: Min assist       Sit to sidelying: Mod assist General bed mobility comments: MOD A for trunk elevation with cueing for implementation of log-roll technique    Transfers Overall transfer level: Needs assistance Equipment used: 2 person hand held assist Transfers: Sit to/from Stand Sit to Stand: +2 physical assistance, Min assist     Step pivot transfers: Min assist, +2 physical assistance           Balance Overall balance assessment: Needs assistance Sitting-balance support: No upper extremity supported, Feet supported Sitting balance-Leahy Scale: Good Sitting balance - Comments: steady reaching within BOS   Standing balance support: Bilateral upper extremity supported, Reliant on assistive device for balance, During functional activity Standing balance-Leahy Scale: Fair                             ADL either performed or assessed with clinical judgement   ADL Overall ADL's : Needs assistance/impaired     Grooming: Wash/dry face;Sitting;Set up;Minimal assistance Grooming Details (indicate cue type and reason): Min A for thoroughness, pt significantly lethargic.             Lower Body Dressing: Maximal assistance;Bed level Lower Body Dressing Details (indicate cue type and reason): MAX A to don bilat socks at bed level. Pt able to minimally lift BLE to assist with positioning socks this date, but  is not able to participate further 2/2 fatigue/lethargy. Toilet Transfer: Minimal assistance;+2 for physical assistance Toilet  Transfer Details (indicate cue type and reason): +2 HHA for simulated toilet transfer to recliner         Functional mobility during ADLs: Minimal assistance;+2 for physical assistance      Extremity/Trunk Assessment              Vision Baseline Vision/History: 1 Wears glasses Patient Visual Report: No change from baseline     Perception     Praxis      Cognition Arousal/Alertness: Lethargic Behavior During Therapy: WFL for tasks assessed/performed, Flat affect Overall Cognitive Status: No family/caregiver present to determine baseline cognitive functioning                                 General Comments: Pt lethargic throughout session, requires cueing to open eyes.        Exercises Other Exercises Other Exercises: Pt educated on log-roll technique for bed mobility. OT faciliated ADL management with assist and education as described above.    Shoulder Instructions       General Comments      Pertinent Vitals/ Pain       Pain Assessment Pain Assessment: Faces Faces Pain Scale: Hurts little more Pain Location: Back with mobility attempts Pain Descriptors / Indicators: Grimacing, Guarding, Aching Pain Intervention(s): Limited activity within patient's tolerance, Monitored during session, Repositioned  Home Living                                          Prior Functioning/Environment              Frequency  Min 2X/week        Progress Toward Goals  OT Goals(current goals can now be found in the care plan section)  Progress towards OT goals: Progressing toward goals  Acute Rehab OT Goals Patient Stated Goal: to feel better OT Goal Formulation: With patient Time For Goal Achievement: 04/30/23 Potential to Achieve Goals: Good  Plan Discharge plan remains appropriate;Frequency remains appropriate    Co-evaluation                 AM-PAC OT "6 Clicks" Daily Activity     Outcome Measure   Help from  another person eating meals?: A Little Help from another person taking care of personal grooming?: A Little Help from another person toileting, which includes using toliet, bedpan, or urinal?: A Lot Help from another person bathing (including washing, rinsing, drying)?: A Lot Help from another person to put on and taking off regular upper body clothing?: A Little Help from another person to put on and taking off regular lower body clothing?: A Lot 6 Click Score: 15    End of Session    OT Visit Diagnosis: Other abnormalities of gait and mobility (R26.89);Muscle weakness (generalized) (M62.81);History of falling (Z91.81);Pain Pain - part of body:  (back)   Activity Tolerance Patient tolerated treatment well   Patient Left in chair;with call bell/phone within reach;with chair alarm set   Nurse Communication Mobility status        Time: 0981-1914 OT Time Calculation (min): 8 min  Charges: OT General Charges $OT Visit: 1 Visit OT Treatments $Self Care/Home Management : 8-22 mins  Rockney Ghee, M.S., OTR/L 04/21/23, 12:33 PM

## 2023-04-21 NOTE — Progress Notes (Addendum)
Progress Note   Patient: Sheryl Michael DOB: 1931/05/05 DOA: 04/14/2023     5 DOS: the patient was seen and examined on 04/21/2023   Brief hospital course:  89 y.o. female with medical history significant of T8-L2 posterior spinal fusion with T8-9 and L1-2 cement augmentation done on March 21, 2023.  Indication seems to have been increasing angulation of L1 acute fracture that was diagnosed before the procedure at approximately Mar 17 2023.  Patient was subsequently discharged on April 05, 2023.    [facility].  History obtained from daughter.  Daughter details that patient has not been eating or mobilizing well.  Is on fairly high-dose narcotics.   Admitted for wound dehiscence and concern for infection   6/29: Case discussed in detail with neurosurgery Dr. Marcell Barlow.  I agree that patient may benefit from return to OR for wound closure and possible VAC placement.  Wound does not appear obviously infected however healing may be difficult without reexploration wound.   6/30: S/p wound exploration.  Vac placed.  No obv sign of infection 7/1: Attempted to eat more but oral intake still remains suboptimal.  TOC looking for alternative placement options 7/3 : Seen and examined during rounds.  Very flat affect.  Continues to have very poor oral intake. 7/4: Wound culture yielded Pseudomonas and MRSA.  Patient on cefepime and vancomycin     Assessment and Plan: Principal Problem:   Wound dehiscence Active Problems:   Atrial fibrillation with RVR (HCC)   Visit for wound check   Dementia with behavioral disturbance (HCC)   Malnutrition of moderate degree   Surgical wound dehiscence Patient is 3 weeks post T8 L2 posterior spinal fusion.  Procedure was done on 6/4.   Wound did not appear obviously infected however is dehisced Status post wound closure on 6/30 Wound culture from 07/04 yields MRSA and Pseudomonas.  Patient already on  vancomycin and cefepime # 7 Requested ID consultation to discuss duration of antibiotic therapy, recommends 1 week of ciprofloxacin and Zyvox Continue encouraging p.o. intake.  Continue multimodal pain regimen.  Minimize use of narcotics.  Continue therapy evaluations.  This per neurosurgery VAC will stay on until Friday 7/5.  Should have a disposition plan by then.  Will plan to keep in the hospital until that point.     Atrial fibrillation with rapid ventricular response Rate control improved.   Continue home diltiazem Continue holding Xarelto for 7 days postoperatively.  07/07 Okay for VTE prophylaxis, SQ Lovenox    Dementia with behavioral disturbances Continue Namenda and Lexapro Will add Remeron Consult psychiatry   Hypothyroidism Continue Synthroid   Moderate malnutrition Moderate Malnutrition related to social / environmental circumstances as evidenced by mild fat depletion, moderate fat depletion, mild muscle depletion, moderate muscle depletion.  Appreciate dietitian input Continue MVI with minerals daily Continue Ensure Enlive po BID, each supplement provides 350 kcal and 20 grams of protein -D/c Juven     Hyperlipidemia Lipitor     GERD PPI          Subjective: Patient is seen and examined at the bedside.  Continues to have poor oral intake  Physical Exam: Vitals:   04/20/23 0456 04/20/23 0725 04/21/23 0333 04/21/23 0800  BP: (!) 124/53 117/63 (!) 156/41 (!) 138/45  Pulse: (!) 54 91 62 62  Resp: 18 17 18 17   Temp: 98.8 F (37.1 C) 98.8 F (37.1 C) 98.4 F (36.9 C) 98.3 F (36.8 C)  TempSrc:  SpO2: 98% 97% 99% 97%  Weight:      Height:      General exam: NAD.  Frail-appearing Respiratory system: Lungs clear.  Normal work of breathing.  Room air Cardiovascular system: S1-S2, RRR, no murmurs, no pedal edema Gastrointestinal system: Thin, soft, NT/ND, normal bowel sounds Central nervous system: Lethargic but arouses easily. No focal  neurological deficits. Extremities: Symmetric 5 x 5 power. Skin: No rashes, lesions or ulcers Psychiatry: Judgement and insight appear impaired. Mood & affect flattened.     Data Reviewed:  There are no new results to review at this time.  Family Communication: Called and left a voicemail for patient's son, Sheryl Michael. Awaiting call back.  Received call back from patient's daughter, Sheryl Michael who lives in [place].  Discussed in detail with her patient's overall condition, poor oral intake and frailty.  We also discussed that patient may not be a candidate for assisted living facility since she requires total care.  She understands and will discuss with her siblings about patient's disposition.  Disposition: Status is: Inpatient Remains inpatient appropriate because: Awaiting discharge to skilled nursing facility  Planned Discharge Destination: Skilled nursing facility    Time spent: 35 minutes  Author: Lucile Shutters, MD 04/21/2023 12:06 PM  For on call review [email or web address].

## 2023-04-21 NOTE — TOC Progression Note (Signed)
Transition of Care [facility]) - Progression Note    Patient Details  Name: Sheryl Michael MRN: [id number] Date of Birth: 10-Jun-1931  Transition of Care [facility]) CM/SW Contact  Marlowe Sax, RN Phone Number: 04/21/2023, 2:20 PM  Clinical Narrative:     Coralyn Pear out to the patients daughter Sheryl Michael and discussed that the patient is not ALF level of care at this time, I explained that she is not eating well and that shed continues to get weaker, I explained that [facility] is still able to offer a bed, we also discussed long term care and  that [facility] is one of the facilities that have Long term care, we discussed that she could also choose to go home with 24/7 care, I explained that Long term care at a facility and at home is not covered under Ins however PCS services are available and there are several companies that provide that service at a self pay rate, rate depending on what level of care the patient is.  Debbie stated that she will discuss with her siblings the plan and what to do going forward and we will discuss again  Expected Discharge Plan: Skilled Nursing Facility Barriers to Discharge: SNF Pending bed offer, Insurance Authorization  Expected Discharge Plan and Services   Discharge Planning Services: CM Consult   Living arrangements for the past 2 months: Independent Living Facility                   DME Agency: NA       HH Arranged: NA HH Agency: NA         Social Determinants of Health (SDOH) Interventions SDOH Screenings   Food Insecurity: No Food Insecurity (04/15/2023)  Housing: Low Risk  (04/15/2023)  Transportation Needs: No Transportation Needs (04/15/2023)  Utilities: Not At Risk (04/15/2023)  Tobacco Use: Low Risk  (04/20/2023)    Readmission Risk Interventions    04/17/2023    2:11 PM  Readmission Risk Prevention Plan  Transportation Screening Complete  PCP or Specialist Appt within 3-5 Days Complete  HRI or Home Care Consult Complete  Social Work Consult for  Recovery Care Planning/Counseling Complete  Palliative Care Screening Not Applicable  Medication Review Oceanographer) Referral to Pharmacy

## 2023-04-21 NOTE — Progress Notes (Signed)
PT Cancellation Note  Patient Details Name: Sheryl Michael MRN: [id number] DOB: April 06, 1931   Cancelled Treatment:    Reason Eval/Treat Not Completed: Fatigue/lethargy limiting ability to participate Patient is very lethargic this am. Moaning, unable to participate in bed exercises. Will re-attempt later if patient more alert/awake.  Inette Doubrava 04/21/2023, 9:30 AM

## 2023-04-21 NOTE — TOC Progression Note (Addendum)
Transition of Care [facility]) - Progression Note    Patient Details  Name: Sheryl Michael MRN: [id number] Date of Birth: [birth year]  Transition of Care [facility]) CM/SW Contact  Marlowe Sax, RN Phone Number: 04/21/2023, 10:28 AM  Clinical Narrative:     Reached out to Oxly at [facility] and asked if Misty Stanley came to assess the patient to see if they would be able to provide the level of care needed and also if they would be able to accept the patient if she did have a wound vac [place], awaiting a response  Update- [facility] will not be able to accept her with Wound Vac or IV ABX, will need to go to [facility], reached out to [facility] at [facility] to see if they are able to accept her back in the event that they chose to go to [facility], [facility] stated that she may return to [facility]  Expected Discharge Plan: Skilled Nursing Facility Barriers to Discharge: SNF Pending bed offer, Insurance Authorization  Expected Discharge Plan and Services   Discharge Planning Services: CM Consult   Living arrangements for the past 2 months: Independent Living Facility                   DME Agency: NA       HH Arranged: NA HH Agency: NA         Social Determinants of Health (SDOH) Interventions SDOH Screenings   Food Insecurity: No Food Insecurity (04/15/2023)  Housing: Low Risk  (04/15/2023)  Transportation Needs: No Transportation Needs (04/15/2023)  Utilities: Not At Risk (04/15/2023)  Tobacco Use: Low Risk  (04/20/2023)    Readmission Risk Interventions    04/17/2023    2:11 PM  Readmission Risk Prevention Plan  Transportation Screening Complete  PCP or Specialist Appt within 3-5 Days Complete  HRI or Home Care Consult Complete  Social Work Consult for Recovery Care Planning/Counseling Complete  Palliative Care Screening Not Applicable  Medication Review Oceanographer) Referral to Pharmacy

## 2023-04-21 NOTE — Progress Notes (Signed)
Physical Therapy Treatment Patient Details Name: Sheryl Michael MRN: [id number] DOB: [birth year] Today's Date: 04/21/2023   History of Present Illness Sheryl Michael presented with a wound dehiscence after a prior spine surgery.   She was brought back to the emergency department for evaluation.  She had no evidence of infection at that time.  Due to wound dehiscence, irrigation and debridement with reclosure was recommended. Pt underwent Irrigation and debridement of the thoracic and lumbar wound on 04/16/2023; prior spinal surgery (03/20/2023).    PT Comments  Patient received in bed, eyes closed, poor attentiveness, poor alertness, poor initiation of mobility. She requires min A to roll and mod A to push up to sitting edge of bed. Min +2 assist to stand and take several steps over to recliner. She moves rather well, but is limited by pain and the above. Patient will continue to benefit from skilled PT to improve functional independence.        Assistance Recommended at Discharge Frequent or constant Supervision/Assistance  If plan is discharge home, recommend the following:  Can travel by private vehicle    Assistance with cooking/housework;Direct supervision/assist for medications management;Assist for transportation;Help with stairs or ramp for entrance;A lot of help with bathing/dressing/bathroom;A lot of help with walking and/or transfers   No  Equipment Recommendations  Rolling walker (2 wheels)    Recommendations for Other Services       Precautions / Restrictions Precautions Precautions: Fall;Back Precaution Booklet Issued: Yes (comment) Precaution Comments: Hemovac Spinal Brace Comments: Per neurosurgery note 6/29 do not wear brace Restrictions Weight Bearing Restrictions: No     Mobility  Bed Mobility Overal bed mobility: Needs Assistance Bed Mobility: Rolling, Sidelying to Sit Rolling: Min assist Sidelying to sit: Mod assist       General bed mobility comments: MOD  A for trunk elevation with cueing for implementation of log-roll technique    Transfers Overall transfer level: Needs assistance Equipment used: 2 person hand held assist Transfers: Sit to/from Stand Sit to Stand: Min assist   Step pivot transfers: +2 physical assistance, Min assist            Ambulation/Gait Ambulation/Gait assistance: Min assist, +2 physical assistance Gait Distance (Feet): 4 Feet Assistive device: 2 person hand held assist Gait Pattern/deviations: Step-to pattern, Decreased step length - right, Decreased step length - left Gait velocity: decreased     General Gait Details: Patient moves well with min A, poor motivation, awareness, engagement.   Stairs             Wheelchair Mobility     Tilt Bed    Modified Rankin (Stroke Patients Only)       Balance Overall balance assessment: Needs assistance Sitting-balance support: Feet supported Sitting balance-Leahy Scale: Good     Standing balance support: Bilateral upper extremity supported, During functional activity Standing balance-Leahy Scale: Fair Standing balance comment: min A                            Cognition Arousal/Alertness: Lethargic Behavior During Therapy: WFL for tasks assessed/performed, Flat affect Overall Cognitive Status: No family/caregiver present to determine baseline cognitive functioning Area of Impairment: Problem solving, Following commands, Awareness                 Orientation Level: Disoriented to, Situation     Following Commands: Follows one step commands with increased time   Awareness: Emergent Problem Solving: Slow processing, Decreased initiation, Requires  verbal cues, Requires tactile cues, Difficulty sequencing General Comments: Pt lethargic throughout session, requires cueing to open eyes. Cues for all mobility        Exercises Total Joint Exercises Ankle Circles/Pumps: AROM, Both, 10 reps    General Comments         Pertinent Vitals/Pain Pain Assessment Pain Assessment: Faces Faces Pain Scale: Hurts a little bit Pain Location: Back with mobility attempts Pain Descriptors / Indicators: Grimacing, Guarding, Moaning Pain Intervention(s): Monitored during session, Repositioned    Home Living                          Prior Function            PT Goals (current goals can now be found in the care plan section) Acute Rehab PT Goals Patient Stated Goal: none stated PT Goal Formulation: Patient unable to participate in goal setting Time For Goal Achievement: 04/30/23 Potential to Achieve Goals: Fair Progress towards PT goals: Not progressing toward goals - comment (lethargy, poor engagement limiting mobility)    Frequency    Min 3X/week      PT Plan Current plan remains appropriate    Co-evaluation PT/OT/SLP Co-Evaluation/Treatment: Yes Reason for Co-Treatment: Necessary to address cognition/behavior during functional activity;For patient/therapist safety;To address functional/ADL transfers PT goals addressed during session: Mobility/safety with mobility;Balance        AM-PAC PT "6 Clicks" Mobility   Outcome Measure  Help needed turning from your back to your side while in a flat bed without using bedrails?: A Little Help needed moving from lying on your back to sitting on the side of a flat bed without using bedrails?: A Little Help needed moving to and from a bed to a chair (including a wheelchair)?: A Little Help needed standing up from a chair using your arms (e.g., wheelchair or bedside chair)?: A Little Help needed to walk in hospital room?: A Little Help needed climbing 3-5 steps with a railing? : A Lot 6 Click Score: 17    End of Session   Activity Tolerance: Patient limited by lethargy;Patient limited by pain Patient left: in chair;with call bell/phone within reach;with chair alarm set Nurse Communication: Mobility status PT Visit Diagnosis: Difficulty in  walking, not elsewhere classified (R26.2);Other abnormalities of gait and mobility (R26.89);Muscle weakness (generalized) (M62.81);Pain Pain - part of body:  (back)     Time: 1610-9604 PT Time Calculation (min) (ACUTE ONLY): 9 min  Charges:    $Therapeutic Activity: 8-22 mins PT General Charges $$ ACUTE PT VISIT: 1 Visit                     Elianna Windom, PT, GCS 04/21/23,12:51 PM

## 2023-04-21 NOTE — Consult Note (Signed)
Pharmacy Antibiotic Note  Sheryl Michael is a 89 y.o. female admitted on 04/14/2023 with  open wound .  Pharmacy has been consulted for vancomycin dosing. T8-L2 posterior spinal fusion with T8-9 and L1-2 cement augmentation done on March 21, 2023.   Today, 04/21/2023 Day #7 vancomycin and cefepime Renal: SCr WNL WBC WNL Afebrile 6/28 superficial swab culture with P aeruginosa and MRSA 6/30 OR cx (I&D): No growth  Vancomycin levels: 7/6 patient receiving vancomycin 750mg  IV q24h, previous dose given 7/5 at 11:43 Vancomycin trough =  ___ mcg/ml on 7/6 at 1130   Plan: Continue vancomycin 750 mg q24H.  Predicted AUC 536. Goal AUC of 400-600, or vancomycin trough 10-15 mcg/mL for skin/soft tissue infection . Vd 0.72, IBW, Scr 0.8.  Order vancomycin trough prior to 7/6 dose, Due to vancomycin level reagent backorder will only get a trough and dose for a trough of 10-15 mcg/mL Continue cefepime 2g q12H  Per ID will continue IV antibiotics while in hospital  Monitor renal function Once able to take PO, anticipated plan for linezolid po and levofloxacin po (has taken in past despite ciprofloxacin reaction - rash)   Height: 5\' 1"  (154.9 cm) Weight: 56.7 kg (125 lb) IBW/kg (Calculated) : 47.8  Temp (24hrs), Avg:98.4 F (36.9 C), Min:98.3 F (36.8 C), Max:98.4 F (36.9 C)  Recent Labs  Lab 04/14/23 1957 04/14/23 1959 04/15/23 0041 04/15/23 0538 04/17/23 0957 04/19/23 0835 04/20/23 0349  WBC 6.7  --   --  5.9 5.5  --   --   CREATININE  --  1.00  --  0.78 0.70 0.64 0.75  LATICACIDVEN 1.8  --  1.8  --   --   --   --      Estimated Creatinine Clearance: 36 mL/min (by C-G formula based on SCr of 0.75 mg/dL).    Allergies  Allergen Reactions   Bactrim [Sulfamethoxazole-Trimethoprim] Hives   Iodinated Contrast Media Anaphylaxis   Codeine Nausea And Vomiting   Erythromycin Ethylsuccinate Other (See Comments)    Unknown    Phenobarbital Other (See Comments)    Feeling funny, nervous    Ciprofloxacin Rash    Prescribed levofloxacin in 2023 and 2024   Penicillins Rash    Antimicrobials this admission: 6/29 cefepime >>  6/29 vancomycin >>   Dose adjustments this admission: None  Microbiology results: 6/28 BCx: NGTD 6/28 Wound Cx: P aeruginosa (pan-susc), MRSA 6/30 OR cx: NGTD   Thank you for allowing pharmacy to be a part of this patient's care.  Juliette Alcide, PharmD, BCPS, BCIDP Work Cell: [phone] 04/21/2023 4:18 PM

## 2023-04-21 NOTE — Care Management Important Message (Signed)
Important Message  Patient Details  Name: Sheryl Michael MRN: [id number] Date of Birth: 1931/03/31   Medicare Important Message Given:  Yes     [name] A [name] 04/21/2023, 12:31 PM

## 2023-04-21 NOTE — Consult Note (Signed)
NAME: Sheryl Michael  DOB: 15-Apr-1931  MRN: [id number]  Date/Time: 04/21/2023 10:41 AM  REQUESTING PROVIDER: Dr. Joylene Igo Subjective:  REASON FOR CONSULT: Surgical site infection Patient is a limited historian.  Chart reviewed. Sheryl Michael is a 89 y.o. female with a history of breast CA status postlumpectomy and radiation in 2007 and on anastrozaole, hyperlipidemia, hypothyroidism, A-fib, TIA, osteoporosis, Schatzki's ring, recent hospitalization for 5/31-6/19 for back pain and found to have fracture of T11/ L1 and because of an instability underwent open reduction and internal fixation of T11 fracture and L1 fracture with posterolateral arthrodesis T8-L2 and posterior segmental instrumentation T8-L2 with  implants and cement augmentation at T8-T9 and L1-L2.  On 03/20/2023.  During that hospitalization she also developed A-fib with RVR and was treated. She was discharged to the nursing home on 04/05/2023.  She presented on 04/14/2023 from the nursing home with possible infection of the surgical site   Vitals in the ED BP of 95/65, pulse 142, respiratory 16, temperature 98.1 and sats of 97%. Labs revealed a WBC of 6.7 Hemoglobin 11.6 Platelet of 252, creatinine 1. She was seen by neurosurgery and taken for irrigation debridement with reclosure because of wound dehiscence on 04/16/2023.  The OR findings was no evidence of purulence.  And also there was no violation to the muscular fascia so the implants were not uncovered.  Excisional debridement was performed of all tissues that appeared to be nonviable from the skin down to the lumbodorsal fascia.  The culture sent from the OR was no bacteria or no WBCs seen whereas the superficial culture done on 04/14/2023 in the ED was no WBC seen no organisms seen.  Rare Pseudomonas aeruginosa and a few methicillin-resistant Staphylococcus aureus.  Patient is currently on cefepime and vancomycin. Asked to see the patient for antibiotic recommendations for  discharge.   Past Medical History:  Diagnosis Date   Arthritis    Atrial fibrillation (HCC)    Breast cancer (HCC) 2006   right breast ca with lumpectomy and rad tx, left breast ca 2021lumpectomy and rad tx   Colon polyp    Complication of anesthesia    questions about husband who passed in 2012    Cystocele    Depression    Dysrhythmia    Family history of breast cancer    Female bladder prolapse    GERD (gastroesophageal reflux disease)    Goiter    Hyperlipemia    Hypertension    Hypothyroidism    Osteoporosis    Personal history of radiation therapy 2006   right breast ca and left breast 2021   Pneumonia    Procidentia of uterus    Recurrent UTI    Reflux    TIA (transient ischemic attack)    Vaginal atrophy     Past Surgical History:  Procedure Laterality Date   APPENDECTOMY     APPLICATION OF INTRAOPERATIVE CT SCAN  03/20/2023   Procedure: APPLICATION OF INTRAOPERATIVE CT SCAN;  Surgeon: Venetia Night, MD;  Location: [facility];  Service: Neurosurgery;;   BREAST BIOPSY Right 2006   breast cancer   BREAST BIOPSY Left 07/17/2020   [place] bx, vision marker, IMC with lobular features   BREAST LUMPECTOMY Right 2006   positive   BREAST LUMPECTOMY Left 08/03/2020   left breast invasive lobular carcinoma, negative LNs   BREAST SURGERY Right    lumpectomy   CATARACT EXTRACTION W/ INTRAOCULAR LENS  IMPLANT, BILATERAL     COLONOSCOPY  CYSTOCELE REPAIR N/A 12/21/2015   Procedure: ANTERIOR REPAIR (CYSTOCELE);  Surgeon: Herold Harms, MD;  Location: [facility];  Service: Gynecology;  Laterality: N/A;   DILATION AND CURETTAGE OF UTERUS     ELECTROMAGNETIC NAVIGATION BROCHOSCOPY Right 12/13/2018   Procedure: ELECTROMAGNETIC NAVIGATION BRONCHOSCOPY;  Surgeon: Erin Fulling, MD;  Location: [facility];  Service: Cardiopulmonary;  Laterality: Right;   EYE SURGERY     LUMBAR WOUND DEBRIDEMENT N/A 04/16/2023   Procedure: LUMBAR WOUND DEBRIDEMENT;  Surgeon: Venetia Night,  MD;  Location: [facility];  Service: Neurosurgery;  Laterality: N/A;  thoracolumbar wound irrigation and debridement   MINOR APPLICATION OF WOUND VAC N/A 04/16/2023   Procedure: APPLICATION OF WOUND VAC;  Surgeon: Venetia Night, MD;  Location: [facility];  Service: Neurosurgery;  Laterality: N/A;   PARTIAL MASTECTOMY WITH NEEDLE LOCALIZATION AND AXILLARY SENTINEL LYMPH NODE BX Left 08/03/2020   Procedure: PARTIAL MASTECTOMY WITH Radio Frequency tag AND AXILLARY SENTINEL LYMPH NODE BX;  Surgeon: Carolan Shiver, MD;  Location: [facility];  Service: General;  Laterality: Left;   VAGINAL HYSTERECTOMY Bilateral 12/21/2015   Procedure: TVH BSO;  Surgeon: Herold Harms, MD;  Location: [facility];  Service: Gynecology;  Laterality: Bilateral;    Social History   Socioeconomic History   Marital status: Widowed    Spouse name: Not on file   Number of children: Not on file   Years of education: Not on file   Highest education level: Not on file  Occupational History   Not on file  Tobacco Use   Smoking status: Never   Smokeless tobacco: Never  Vaping Use   Vaping Use: Never used  Substance and Sexual Activity   Alcohol use: No   Drug use: No   Sexual activity: Never    Birth control/protection: Post-menopausal  Other Topics Concern   Not on file  Social History Narrative   Not on file   Social Determinants of Health   Financial Resource Strain: Not on file  Food Insecurity: No Food Insecurity (04/15/2023)   Hunger Vital Sign    Worried About Running Out of Food in the Last Year: Never true    Ran Out of Food in the Last Year: Never true  Transportation Needs: No Transportation Needs (04/15/2023)   PRAPARE - Administrator, Civil Service (Medical): No    Lack of Transportation (Non-Medical): No  Physical Activity: Not on file  Stress: Not on file  Social Connections: Not on file  Intimate Partner Violence: Not At Risk (04/15/2023)   Humiliation, Afraid, Rape, and  Kick questionnaire    Fear of Current or Ex-Partner: No    Emotionally Abused: No    Physically Abused: No    Sexually Abused: No    Family History  Problem Relation Age of Onset   Diabetes Sister    Breast cancer Sister        late 31's   Diabetes Brother    Diabetes Sister    Allergies  Allergen Reactions   Bactrim [Sulfamethoxazole-Trimethoprim] Hives   Iodinated Contrast Media Anaphylaxis   Codeine Nausea And Vomiting   Erythromycin Ethylsuccinate Other (See Comments)    Unknown    Phenobarbital Other (See Comments)    Feeling funny, nervous   Ciprofloxacin Rash   Penicillins Rash   I? Current Facility-Administered Medications  Medication Dose Route Frequency Provider Last Rate Last Admin   acetaminophen (TYLENOL) tablet 650 mg  650 mg Oral Q6H PRN Venetia Night, MD   [phone]  mg at 04/20/23 1048   Or   acetaminophen (TYLENOL) suppository 650 mg  650 mg Rectal Q6H PRN Venetia Night, MD       albuterol (PROVENTIL) (2.5 MG/3ML) 0.083% nebulizer solution 2.5 mg  2.5 mg Nebulization Q6H PRN Venetia Night, MD       anastrozole (ARIMIDEX) tablet 1 mg  1 mg Oral Daily Venetia Night, MD   1 mg at 04/21/23 1024   atorvastatin (LIPITOR) tablet 40 mg  40 mg Oral Daily Venetia Night, MD   40 mg at 04/21/23 1025   ceFEPIme (MAXIPIME) 2 g in sodium chloride 0.9 % 100 mL IVPB  2 g Intravenous Q12H Venetia Night, MD 200 mL/hr at 04/21/23 1019 2 g at 04/21/23 1019   cholecalciferol (VITAMIN D3) 25 MCG (1000 UNIT) tablet 500 Units  500 Units Oral Daily Venetia Night, MD   500 Units at 04/21/23 1024   diltiazem (CARDIZEM CD) 24 hr capsule 180 mg  180 mg Oral Daily Venetia Night, MD   180 mg at 04/21/23 1024   docusate sodium (COLACE) capsule 100 mg  100 mg Oral BID Venetia Night, MD   100 mg at 04/20/23 2107   enoxaparin (LOVENOX) injection 40 mg  40 mg Subcutaneous Q24H Lolita Patella B, MD   40 mg at 04/21/23 1020   escitalopram (LEXAPRO) tablet  10 mg  10 mg Oral Daily Venetia Night, MD   10 mg at 04/21/23 1024   feeding supplement (ENSURE ENLIVE / ENSURE PLUS) liquid 237 mL  237 mL Oral BID BM Venetia Night, MD   237 mL at 04/21/23 1021   levothyroxine (SYNTHROID) tablet 50 mcg  50 mcg Oral Q0600 Venetia Night, MD   50 mcg at 04/21/23 0510   memantine (NAMENDA) tablet 5 mg  5 mg Oral BID Venetia Night, MD   5 mg at 04/21/23 1024   multivitamin with minerals tablet 1 tablet  1 tablet Oral Daily Lolita Patella B, MD   1 tablet at 04/21/23 1024   nutrition supplement (JUVEN) (JUVEN) powder packet 1 packet  1 packet Oral BID BM Sreenath, Sudheer B, MD   1 packet at 04/21/23 1021   ondansetron (ZOFRAN) tablet 4 mg  4 mg Oral Q6H PRN Venetia Night, MD       oxyCODONE (Oxy IR/ROXICODONE) immediate release tablet 2.5-5 mg  2.5-5 mg Oral Q4H PRN Venetia Night, MD   5 mg at 04/18/23 1452   pantoprazole (PROTONIX) EC tablet 40 mg  40 mg Oral Daily Venetia Night, MD   40 mg at 04/21/23 1024   polyethylene glycol (MIRALAX / GLYCOLAX) packet 17 g  17 g Oral Daily PRN Venetia Night, MD   17 g at 04/19/23 0930   sodium chloride flush (NS) 0.9 % injection 3 mL  3 mL Intravenous Q12H Venetia Night, MD   3 mL at 04/21/23 1029   tuberculin injection 5 Units  5 Units Intradermal Once Agbata, Tochukwu, MD   5 Units at 04/19/23 1141   vancomycin (VANCOREADY) IVPB 750 mg/150 mL  750 mg Intravenous Q24H Venetia Night, MD 150 mL/hr at 04/20/23 1204 750 mg at 04/20/23 1204     Abtx:  Anti-infectives (From admission, onward)    Start     Dose/Rate Route Frequency Ordered Stop   04/16/23 1000  vancomycin (VANCOREADY) IVPB 750 mg/150 mL       See Hyperspace for full Linked Orders Report.   750 mg 150 mL/hr over 60 Minutes Intravenous Every 24 hours 04/15/23  4098     04/16/23 0855  vancomycin (VANCOCIN) powder  Status:  Discontinued          As needed 04/16/23 0855 04/16/23 0940   04/15/23 1400  ceFAZolin  (ANCEF) IVPB 1 g/50 mL premix  Status:  Discontinued        1 g 100 mL/hr over 30 Minutes Intravenous Every 8 hours 04/15/23 0815 04/15/23 0913   04/15/23 1200  ceFEPIme (MAXIPIME) 2 g in sodium chloride 0.9 % 100 mL IVPB        2 g 200 mL/hr over 30 Minutes Intravenous Every 12 hours 04/15/23 0913     04/15/23 0945  vancomycin (VANCOREADY) IVPB 1250 mg/250 mL       See Hyperspace for full Linked Orders Report.   1,250 mg 166.7 mL/hr over 90 Minutes Intravenous  Once 04/15/23 0941 04/15/23 1307   04/15/23 0600  ceFAZolin (ANCEF) IVPB 1 g/50 mL premix  Status:  Discontinued        1 g 100 mL/hr over 30 Minutes Intravenous Every 8 hours 04/14/23 2332 04/15/23 0815   04/14/23 2030  ceFAZolin (ANCEF) IVPB 1 g/50 mL premix        1 g 100 mL/hr over 30 Minutes Intravenous  Once 04/14/23 2023 04/14/23 2153       REVIEW OF SYSTEMS:  Const: negative fever, negative chills, weight loss, weakness Eyes: negative diplopia or visual changes, negative eye pain ENT: negative coryza, negative sore throat Resp: negative cough, hemoptysis, dyspnea Cards: negative for chest pain, palpitations, lower extremity edema GU: negative for frequency, dysuria and hematuria GI: Poor appetite Skin: negative for rash and pruritus Heme: negative for easy bruising and gum/nose bleeding MS: General weakness Neurolo: Falls, dizziness Psych: anxiety, depression  Endocrine: Hypothyroidism Allergy/Immunology-as above    VITALS:  BP (!) 138/45 (BP Location: Right Arm)   Pulse 62   Temp 98.3 F (36.8 C)   Resp 17   Ht 5\' 1"  (1.549 m)   Wt 56.7 kg   SpO2 97%   BMI 23.62 kg/m   PHYSICAL EXAM:  General: Awake, frail, oriented in place person and year  head: Normocephalic, without obvious abnormality, atraumatic. Eyes: Conjunctivae clear, anicteric sclerae. Pupils are equal ENT Nares normal. No drainage or sinus tenderness. Lips, mucosa, and tongue normal. No Thrush Neck: Supple, symmetrical, no  adenopathy, thyroid: non tender no carotid bruit and no JVD. Back: Thoracic lumbar spine area there is a wound VAC. Lungs: Bilateral air entry.  Decreased at the bases. Heart: Irregular well-controlled.. Abdomen: Soft, non-tender,not distended. Bowel sounds normal. No masses Extremities: atraumatic, no cyanosis. No edema. No clubbing Skin: No rashes or lesions. Or bruising Lymph: Cervical, supraclavicular normal. Neurologic: Grossly non-focal Pertinent Labs Lab Results CBC    Component Value Date/Time   WBC 5.5 04/17/2023 0957   RBC 3.13 (L) 04/17/2023 0957   HGB 9.6 (L) 04/17/2023 0957   HGB 11.9 (L) 04/15/2013 0152   HCT 30.4 (L) 04/17/2023 0957   HCT 35.7 04/15/2013 0152   PLT 228 04/17/2023 0957   PLT 163 04/15/2013 0152   MCV 97.1 04/17/2023 0957   MCV 94 04/15/2013 0152   MCH 30.7 04/17/2023 0957   MCHC 31.6 04/17/2023 0957   RDW 15.0 04/17/2023 0957   RDW 14.4 04/15/2013 0152   LYMPHSABS 0.8 04/17/2023 0957   LYMPHSABS 2.0 04/15/2013 0152   MONOABS 0.2 04/17/2023 0957   MONOABS 0.4 04/15/2013 0152   EOSABS 0.0 04/17/2023 0957   EOSABS 0.3 04/15/2013 0152  BASOSABS 0.0 04/17/2023 0957   BASOSABS 0.1 04/15/2013 0152       Latest Ref Rng & Units 04/20/2023    3:49 AM 04/19/2023    8:35 AM 04/17/2023    9:57 AM  CMP  Glucose 70 - 99 mg/dL 161   096   BUN 8 - 23 mg/dL 21   13   Creatinine 0.45 - 1.00 mg/dL 4.09  8.11  9.14   Sodium 135 - 145 mmol/L 137   138   Potassium 3.5 - 5.1 mmol/L 3.8   3.9   Chloride 98 - 111 mmol/L 105   108   CO2 22 - 32 mmol/L 21   22   Calcium 8.9 - 10.3 mg/dL 8.8   8.7       Microbiology: Recent Results (from the past 240 hour(s))  Aerobic/Anaerobic Culture w Gram Stain (surgical/deep wound)     Status: None   Collection Time: 04/14/23  7:57 PM   Specimen: Incision  Result Value Ref Range Status   Specimen Description   Final    INCISION Performed at [facility], [address] [place], [place] [address]     Special Requests   Final    NONE Performed at [facility], [address] [place], [place] [address]    Gram Stain NO WBC SEEN NO ORGANISMS SEEN   Final   Culture   Final    RARE PSEUDOMONAS AERUGINOSA FEW METHICILLIN RESISTANT STAPHYLOCOCCUS AUREUS NO ANAEROBES ISOLATED Performed at [facility], [address], [place], [place] [address]    Report Status 04/20/2023 FINAL  Final   Organism ID, Bacteria PSEUDOMONAS AERUGINOSA  Final   Organism ID, Bacteria METHICILLIN RESISTANT STAPHYLOCOCCUS AUREUS  Final      Susceptibility   Methicillin resistant staphylococcus aureus - MIC*    CIPROFLOXACIN 4 RESISTANT Resistant     ERYTHROMYCIN >=8 RESISTANT Resistant     GENTAMICIN <=0.5 SENSITIVE Sensitive     OXACILLIN >=4 RESISTANT Resistant     TETRACYCLINE <=1 SENSITIVE Sensitive     VANCOMYCIN <=0.5 SENSITIVE Sensitive     TRIMETH/SULFA <=10 SENSITIVE Sensitive     CLINDAMYCIN <=0.25 SENSITIVE Sensitive     RIFAMPIN <=0.5 SENSITIVE Sensitive     Inducible Clindamycin NEGATIVE Sensitive     LINEZOLID 2 SENSITIVE Sensitive     * FEW METHICILLIN RESISTANT STAPHYLOCOCCUS AUREUS   Pseudomonas aeruginosa - MIC*    CEFTAZIDIME 4 SENSITIVE Sensitive     CIPROFLOXACIN 0.5 SENSITIVE Sensitive     GENTAMICIN <=1 SENSITIVE Sensitive     IMIPENEM <=0.25 SENSITIVE Sensitive     PIP/TAZO 8 SENSITIVE Sensitive     CEFEPIME 2 SENSITIVE Sensitive     * RARE PSEUDOMONAS AERUGINOSA  Blood Culture (routine x 2)     Status: None   Collection Time: 04/14/23  7:57 PM   Specimen: BLOOD  Result Value Ref Range Status   Specimen Description BLOOD BLOOD LEFT HAND  Final   Special Requests   Final    BOTTLES DRAWN AEROBIC AND ANAEROBIC Blood Culture results may not be optimal due to an inadequate volume of blood received in culture bottles   Culture   Final    NO GROWTH 5 DAYS Performed at [facility], [address] [place], [place] [address]    Report Status 04/19/2023  FINAL  Final  Blood Culture (routine x 2)     Status: None   Collection Time: 04/14/23  8:01 PM  Specimen: BLOOD  Result Value Ref Range Status   Specimen Description BLOOD BLOOD RIGHT ARM  Final   Special Requests   Final    BOTTLES DRAWN AEROBIC AND ANAEROBIC Blood Culture results may not be optimal due to an excessive volume of blood received in culture bottles   Culture   Final    NO GROWTH 5 DAYS Performed at [facility], [address] [place], [place] [address]    Report Status 04/19/2023 FINAL  Final  Aerobic/Anaerobic Culture w Gram Stain (surgical/deep wound)     Status: None (Preliminary result)   Collection Time: 04/16/23  8:41 AM   Specimen: PATH Soft tissue  Result Value Ref Range Status   Specimen Description   Final    WOUND Performed at [facility], [address] [place], [place] [address]    Special Requests   Final    NONE Performed at [facility], [address] [place], [place] [address]    Gram Stain NO WBC SEEN NO ORGANISMS SEEN   Final   Culture   Final    NO GROWTH 4 DAYS NO ANAEROBES ISOLATED; CULTURE IN PROGRESS FOR 5 DAYS Performed at [facility], [address], [place], [place] [address]    Report Status PENDING  Incomplete    IMAGING RESULTS: CXR-N I have personally reviewed the films ? Impression/Recommendation Superficial wound dehiscence of the thoracic lumbar surgical wound. No deep infection Cultures from the surgical procedure was negative The superficial culture done in the ED was MRSA and Pseudomonas but there were no WBCs seen on the smear and hence this likely was colonization.  But because of recent surgery and to prevent deep infection will treat both the organism for a total of 2 weeks.  She has already received nearly a week of cefepime and vancomycin.  Can do Levaquin and linezolid on discharge until  04/28/2023  History of fall with fracture of the TL and L1 spine status post fusion  surgery  CA breast left status postlumpectomy and radiation.  On anastrozole currently  A-fib on diltiazem ? Anemia  Frailty  Poor intake  Failure to thrive _  __________________________________________________ Discussed with her son by phone and with requesting provider and the neurosurgeon [facility] on-call this weekend.  Available by phone for urgent issues  Note:  This document was prepared using Dragon voice recognition software and may include unintentional dictation errors.

## 2023-04-22 DIAGNOSIS — T8130XA Disruption of wound, unspecified, initial encounter: Secondary | ICD-10-CM | POA: Diagnosis not present

## 2023-04-22 LAB — CREATININE, SERUM
Creatinine, Ser: 0.69 mg/dL (ref 0.44–1.00)
GFR, Estimated: >60 mL/min (ref >60)

## 2023-04-22 LAB — VANCOMYCIN, TROUGH: Vancomycin Tr: 12 ug/mL — ABNORMAL LOW (ref 15–20)

## 2023-04-22 MED ORDER — SODIUM CHLORIDE 0.9 % IV SOLN: INTRAVENOUS | Status: DC

## 2023-04-22 NOTE — Consult Note (Signed)
Pharmacy Antibiotic Note  Sheryl Michael is a 89 y.o. female admitted on 04/14/2023 with  open wound .  Pharmacy has been consulted for vancomycin dosing. T8-L2 posterior spinal fusion with T8-9 and L1-2 cement augmentation done on March 21, 2023.   Today, 04/22/2023 Day #7 vancomycin and cefepime Renal: SCr WNL WBC WNL Afebrile 6/28 superficial swab culture with P aeruginosa and MRSA 6/30 OR cx (I&D): No growth  Vancomycin levels: 7/6 patient receiving vancomycin 750mg  IV q24h, previous dose given 7/5 at 11:43 Vancomycin trough = 12 mcg/ml on 7/6 at 1130   Plan: Vancomycin trough within target range, continue vancomycin 750 mg q24H.  Predicted AUC 536. Goal AUC of 400-600, or vancomycin trough 10-15 mcg/mL for skin/soft tissue infection . Vd 0.72, IBW, Scr 0.8.  Continue cefepime 2g q12H  Per ID will continue IV antibiotics while in hospital  Monitor renal function Once able to take PO, anticipated plan for linezolid po and levofloxacin po (has taken in past despite ciprofloxacin reaction - rash)   Height: 5\' 1"  (154.9 cm) Weight: 56.7 kg (125 lb) IBW/kg (Calculated) : 47.8  Temp (24hrs), Avg:98.2 F (36.8 C), Min:97.8 F (36.6 C), Max:98.5 F (36.9 C)  Recent Labs  Lab 04/17/23 0957 04/19/23 0835 04/20/23 0349 04/22/23 1125  WBC 5.5  --   --   --   CREATININE 0.70 0.64 0.75 0.69  VANCOTROUGH  --   --   --  12*     Estimated Creatinine Clearance: 36 mL/min (by C-G formula based on SCr of 0.69 mg/dL).    Allergies  Allergen Reactions   Bactrim [Sulfamethoxazole-Trimethoprim] Hives   Iodinated Contrast Media Anaphylaxis   Codeine Nausea And Vomiting   Erythromycin Ethylsuccinate Other (See Comments)    Unknown    Phenobarbital Other (See Comments)    Feeling funny, nervous   Ciprofloxacin Rash    Prescribed levofloxacin in 2023 and 2024   Penicillins Rash    Antimicrobials this admission: 6/29 cefepime >>  6/29 vancomycin >>   Dose adjustments this  admission: None  Microbiology results: 6/28 BCx: NGTD 6/28 Wound Cx: P aeruginosa (pan-susc), MRSA 6/30 OR cx: NGTD   Thank you for allowing pharmacy to be a part of this patient's care.  Otelia Sergeant, PharmD, [facility] 04/22/2023 8:36 PM

## 2023-04-22 NOTE — Progress Notes (Signed)
Progress Note   Patient: Sheryl Michael JOA:[id number] DOB: [birth year] DOA: 04/14/2023     6 DOS: the patient was seen and examined on 04/22/2023   Brief hospital course: 89 y.o. female with medical history significant of T8-L2 posterior spinal fusion with T8-9 and L1-2 cement augmentation done on March 21, 2023.  Indication seems to have been increasing angulation of L1 acute fracture that was diagnosed before the procedure at approximately Mar 17 2023.  Patient was subsequently discharged on April 05, 2023.    [facility]  History obtained from daughter.  Daughter details that patient has not been eating or mobilizing well.  Is on fairly high-dose narcotics.   Admitted for wound dehiscence and concern for infection   6/29: Case discussed in detail with neurosurgery Dr. Marcell Barlow.  I agree that patient may benefit from return to OR for wound closure and possible VAC placement.  Wound does not appear obviously infected however healing may be difficult without reexploration wound.   6/30: S/p wound exploration.  Vac placed.  No obv sign of infection 7/1: Attempted to eat more but oral intake still remains suboptimal.  TOC looking for alternative placement options 7/3 : Seen and examined during rounds.  Very flat affect.  Continues to have very poor oral intake. 7/4: Wound culture yielded Pseudomonas and MRSA.  Patient on cefepime and vancomycin.      Assessment and Plan:  Principal Problem:   Wound dehiscence Active Problems:   Atrial fibrillation with RVR (HCC)   Visit for wound check   Dementia with behavioral disturbance (HCC)   Malnutrition of moderate degree   Surgical wound dehiscence Patient is 4 weeks post T8 L2 posterior spinal fusion.  Procedure was done on 6/4.   Wound did not appear obviously infected however was dehisced on admission Status post wound closure on 6/30 Wound culture from 07/04 yields MRSA and Pseudomonas.  Patient  already on vancomycin and cefepime # 8 Requested ID consultation to discuss duration of antibiotic therapy, recommends 1 week of ciprofloxacin and Zyvox Continue encouraging p.o. intake.  Continue multimodal pain regimen.  Minimize use of narcotics.  Continue therapy evaluations.  This per neurosurgery VAC will stay on until Friday 7/5.  Should have a disposition plan by then.  Will plan to keep in the hospital until that point.      Atrial fibrillation with rapid ventricular response Rate control improved.   Continue home diltiazem Continue holding Xarelto for 7 days postoperatively.  07/07 Okay for VTE prophylaxis, SQ Lovenox     Dementia with behavioral disturbances Continue Namenda and Lexapro Will add Remeron Consult psychiatry    Hypothyroidism Continue Synthroid    Moderate malnutrition Moderate Malnutrition related to social / environmental circumstances as evidenced by mild fat depletion, moderate fat depletion, mild muscle depletion, moderate muscle depletion.  Appreciate dietitian input Continue MVI with minerals daily Continue Ensure Enlive po BID, each supplement provides 350 kcal and 20 grams of protein -D/c Juven     Hyperlipidemia Lipitor     GERD PPI   Adult failure to thrive Patient is severely deconditioned and has very poor oral intake She requires assistance with all activities of daily living        Subjective: Patient is seen and examined at the bedside.  Sleeping but arouses easily  Physical Exam: Vitals:   04/21/23 0800 04/21/23 1710 04/21/23 2309 04/22/23 0900  BP: (!) 138/45 (!) 139/44 (!) 144/45 (!) 149/51  Pulse: 62 62  64 100  Resp: 17 16 18 16   Temp: 98.3 F (36.8 C) 98.6 F (37 C) 98.5 F (36.9 C) 98.2 F (36.8 C)  TempSrc:      SpO2: 97% 100% 100% 97%  Weight:      Height:      General exam: NAD.  Frail-appearing Respiratory system: Lungs clear.  Normal work of breathing.  Room air Cardiovascular system: S1-S2, RRR, no  murmurs, no pedal edema Gastrointestinal system: Thin, soft, NT/ND, normal bowel sounds Central nervous system: Lethargic but arouses easily. No focal neurological deficits. Extremities: Symmetric 5 x 5 power. Skin: No rashes, lesions or ulcers Psychiatry: Judgement and insight appear impaired. Mood & affect flattened.     Data Reviewed:  There are no new results to review at this time.  Family Communication: Discussed with patient's daughter on 04/21/23 about patient's current condition and plan of care.  Disposition: Status is: Inpatient Remains inpatient appropriate because: Remains on IV antibiotics and has a wound VAC in place  Planned Discharge Destination: Skilled nursing facility    Time spent: 35 minutes  Author: Lucile Shutters, MD 04/22/2023 11:24 AM  For on call review [email or web address].

## 2023-04-22 NOTE — Progress Notes (Signed)
  Neurosurgery Progress Note  History: Sheryl Michael is a 89 y.o presenting with wound dehiscence s/p washout and closure.     POD6: Patient and family changed status to DNR, are currently evaluating placement opportunities.  POD4: Continues with poor PO intake, no difficulty so far with woundvac.  POD3: Pt has poor oral intake. Was encouraged overnight. S POD2: Patient is up in her bedside chair this morning.  She denies any significant back pain. POD1: NEAO  Physical Exam: Vitals:   04/21/23 2309 04/22/23 0900  BP: (!) 144/45 (!) 149/51  Pulse: 64 100  Resp: 18 16  Temp: 98.5 F (36.9 C) 98.2 F (36.8 C)  SpO2: 100% 97%    Drowsy but arouses easily to voice CNI  Strength: MAEW  Data:  Other tests/results:  Specimen Description INCISION Performed at [facility], [address], [place], [place] [address]  Special Requests NONE Performed at [facility], [address] [place], [place] [address]  Gram Stain NO WBC SEEN NO ORGANISMS SEEN  Culture RARE PSEUDOMONAS AERUGINOSA FEW METHICILLIN RESISTANT STAPHYLOCOCCUS AUREUS NO ANAEROBES ISOLATED Performed at [facility], [address], [place], [place] [address]     Assessment/Plan:  Sheryl Michael is an 89 y.o presenting from outside facility with wound dehiscence s/p washout and closer.  - mobilize - pain control; avoid oversedation, follow w/ psych recs - ok for DVT prophylaxis - PTOT; stressed the importance of continuing to work towards mobility - Encourage nutrition for wound healing - ABX per ID, Cx results above - HV removed 7/3. continue wound vac, to Remove 7/7.   Sheryl Kim, MD

## 2023-04-23 DIAGNOSIS — T8130XA Disruption of wound, unspecified, initial encounter: Secondary | ICD-10-CM | POA: Diagnosis not present

## 2023-04-23 LAB — CBC
HCT: 38.7 % (ref 36.0–46.0)
Hemoglobin: 12 g/dL (ref 12.0–15.0)
MCH: 30.3 pg (ref 26.0–34.0)
MCHC: 31 g/dL (ref 30.0–36.0)
MCV: 97.7 fL (ref 80.0–100.0)
Platelets: 263 10*3/uL (ref 150–400)
RBC: 3.96 MIL/uL (ref 3.87–5.11)
RDW: 15.9 % — ABNORMAL HIGH (ref 11.5–15.5)
WBC: 7.5 10*3/uL (ref 4.0–10.5)
nRBC: 0 % (ref 0.0–0.2)

## 2023-04-23 LAB — BASIC METABOLIC PANEL
Anion gap: 11 (ref 5–15)
BUN: 16 mg/dL (ref 8–23)
CO2: 20 mmol/L — ABNORMAL LOW (ref 22–32)
Calcium: 9.1 mg/dL (ref 8.9–10.3)
Chloride: 107 mmol/L (ref 98–111)
Creatinine, Ser: 0.62 mg/dL (ref 0.44–1.00)
GFR, Estimated: >60 mL/min (ref >60)
Glucose, Bld: 86 mg/dL (ref 70–99)
Potassium: 3.3 mmol/L — ABNORMAL LOW (ref 3.5–5.1)
Sodium: 138 mmol/L (ref 135–145)

## 2023-04-23 MED ORDER — POTASSIUM CHLORIDE CRYS ER 20 MEQ PO TBCR
20.0000 meq | EXTENDED_RELEASE_TABLET | Freq: Once | ORAL | Status: AC
  Administered 2023-04-23: 20 meq via ORAL
  Filled 2023-04-23: qty 1

## 2023-04-23 NOTE — Progress Notes (Signed)
  Neurosurgery Progress Note  History: Sheryl Michael is a 89 y.o presenting with wound dehiscence s/p washout and closure.    POD7: Wound vac Removed.  POD6: Patient and family changed status to DNR, are currently evaluating placement opportunities.  POD4: Continues with poor PO intake, no difficulty so far with woundvac.  POD3: Pt has poor oral intake. Was encouraged overnight. S POD2: Patient is up in her bedside chair this morning.  She denies any significant back pain. POD1: NEAO  Physical Exam: Vitals:   04/23/23 0053 04/23/23 0730  BP: 129/67 (!) 149/70  Pulse: 66 71  Resp: 18 16  Temp: (!) 97.5 F (36.4 C) 97.7 F (36.5 C)  SpO2: 100% 99%    Drowsy but arouses easily to voice CNI  Strength: MAEW  Incision is closed with stitches, no evidence of active drainage, no spreading redness.   Data:  Other tests/results:  Specimen Description INCISION Performed at [facility], [address] [place], [place] [address]  Special Requests NONE Performed at [facility], [address] [place], [place] [address]  Gram Stain NO WBC SEEN NO ORGANISMS SEEN  Culture RARE PSEUDOMONAS AERUGINOSA FEW METHICILLIN RESISTANT STAPHYLOCOCCUS AUREUS NO ANAEROBES ISOLATED Performed at [facility], [address] [place], [place] [address]     Assessment/Plan:  Sheryl Michael is an 89 y.o presenting from outside facility with wound dehiscence s/p washout and closer.  - mobilize - pain control; avoid oversedation, follow w/ psych recs - ok for DVT prophylaxis - PTOT; stressed the importance of continuing to work towards mobility - Encourage nutrition for wound healing - ABX per ID, Cx results above - HV removed 7/3. continue wound vac, to Removed 7/7.   Lovenia Kim, MD

## 2023-04-23 NOTE — Progress Notes (Addendum)
Progress Note   Patient: Sheryl Michael ZOX:[id number] DOB: [birth year] DOA: 04/14/2023     7 DOS: the patient was seen and examined on 04/23/2023   Brief hospital course: 89 y.o. female with medical history significant of T8-L2 posterior spinal fusion with T8-9 and L1-2 cement augmentation done on March 21, 2023.  Indication seems to have been increasing angulation of L1 acute fracture that was diagnosed before the procedure at approximately Mar 17 2023.  Patient was subsequently discharged on April 05, 2023.    [facility].  History obtained from daughter.  Daughter details that patient has not been eating or mobilizing well.  Is on fairly high-dose narcotics.   Admitted for wound dehiscence and concern for infection   6/29: Case discussed in detail with neurosurgery Dr. Marcell Barlow.  I agree that patient may benefit from return to OR for wound closure and possible VAC placement.  Wound does not appear obviously infected however healing may be difficult without reexploration wound.   6/30: S/p wound exploration.  Vac placed.  No obv sign of infection 7/1: Attempted to eat more but oral intake still remains suboptimal.  TOC looking for alternative placement options 7/3 : Seen and examined during rounds.  Very flat affect.  Continues to have very poor oral intake. 7/4: Wound culture yielded Pseudomonas and MRSA.  Patient on cefepime and vancomycin. 7/7 : Wound Vac removed    Assessment and Plan:  Principal Problem:   Wound dehiscence Active Problems:   Atrial fibrillation with RVR (HCC)   Visit for wound check   Dementia with behavioral disturbance (HCC)   Malnutrition of moderate degree   Surgical wound dehiscence Patient is 4 weeks post T8 L2 posterior spinal fusion.  Procedure was done on 6/4.   Wound did not appear obviously infected however was dehisced on admission Status post wound closure on 6/30 Wound culture from 07/04 yields MRSA and  Pseudomonas.  Patient already on vancomycin and cefepime # 9 Requested ID consultation to discuss duration of antibiotic therapy, recommends 1 more week of oral quinolone and Zyvox Continue encouraging p.o. intake.  Continue multimodal pain regimen.  Minimize use of narcotics.  Continue therapy evaluations.  This per neurosurgery VAC will stay on until Friday 7/5.  Should have a disposition plan by then.  Will plan to keep in the hospital until that point.      Atrial fibrillation with rapid ventricular response Rate control improved.   Continue home diltiazem Will resume Xarelto today 04/23/23    Dementia with behavioral disturbances Continue Namenda and Lexapro Appreciate psychiatry input     Hypothyroidism Continue Synthroid     Moderate malnutrition Moderate Malnutrition related to social / environmental circumstances as evidenced by mild fat depletion, moderate fat depletion, mild muscle depletion, moderate muscle depletion.  Appreciate dietitian input Continue MVI with minerals daily Continue Ensure Enlive po BID, each supplement provides 350 kcal and 20 grams of protein -D/c Juven     Hyperlipidemia Lipitor     GERD PPI     Adult failure to thrive Patient is severely deconditioned and has very poor oral intake She requires assistance with all activities of daily living Continue Megace   Hypokalemia Supplement potassium Check magnesium level     Subjective: Patient is seen and examined at the bedside.  Has no new complaints  Physical Exam: Vitals:   04/22/23 0900 04/22/23 1647 04/23/23 0053 04/23/23 0730  BP: (!) 149/51 (!) 126/58 129/67 (!) 149/70  Pulse:  100 88 66 71  Resp: 16 16 18 16   Temp: 98.2 F (36.8 C) 97.8 F (36.6 C) (!) 97.5 F (36.4 C) 97.7 F (36.5 C)  TempSrc:      SpO2: 97% 98% 100% 99%  Weight:      Height:       General exam: NAD.  Frail-appearing Respiratory system: Lungs clear.  Normal work of breathing.  Room  air Cardiovascular system: S1-S2, RRR, no murmurs, no pedal edema Gastrointestinal system: Thin, soft, NT/ND, normal bowel sounds Central nervous system: Lethargic but arouses easily. No focal neurological deficits. Extremities: Symmetric 5 x 5 power. Skin: No rashes, lesions or ulcers Psychiatry: Judgement and insight appear impaired. Mood & affect flat     Data Reviewed: Labs reviewed.  Potassium 3.3, white count 7.5 There are no new results to review at this time.  Family Communication: None  Disposition: Status is: Inpatient Remains inpatient appropriate because: Awaiting discharge to skilled nursing facility  Planned Discharge Destination: Skilled nursing facility    Time spent: 33 minutes  Author: Lucile Shutters, MD 04/23/2023 12:03 PM  For on call review [email or web address].

## 2023-04-23 NOTE — Plan of Care (Signed)
  Problem: Clinical Measurements: Goal: Ability to maintain clinical measurements within normal limits will improve Outcome: Progressing Goal: Will remain free from infection Outcome: Progressing Goal: Diagnostic test results will improve Outcome: Progressing   Problem: Nutrition: Goal: Adequate nutrition will be maintained Outcome: Progressing   

## 2023-04-23 NOTE — Plan of Care (Signed)
  Problem: Fluid Volume: Goal: Hemodynamic stability will improve Outcome: Progressing   Problem: Clinical Measurements: Goal: Will remain free from infection Outcome: Progressing   Problem: Activity: Goal: Risk for activity intolerance will decrease Outcome: Progressing   Problem: Nutrition: Goal: Adequate nutrition will be maintained Outcome: Progressing   Problem: Pain Managment: Goal: General experience of comfort will improve Outcome: Progressing   Problem: Safety: Goal: Ability to remain free from injury will improve Outcome: Progressing   Problem: Skin Integrity: Goal: Risk for impaired skin integrity will decrease Outcome: Progressing

## 2023-04-23 NOTE — Progress Notes (Signed)
Physical Therapy Treatment Patient Details Name: Sheryl Michael MRN: [id number] DOB: 01/07/1931 Today's Date: 04/23/2023   History of Present Illness Sheryl Michael presented with a wound dehiscence after a prior spine surgery.   She was brought back to the emergency department for evaluation.  She had no evidence of infection at that time.  Due to wound dehiscence, irrigation and debridement with reclosure was recommended. Pt underwent Irrigation and debridement of the thoracic and lumbar wound on 04/16/2023; prior spinal surgery (03/20/2023).    PT Comments  Pt seen for PT tx with pt reluctantly agreeable. Pt declines getting OOB but agreeable to bed level exercises. Pt demonstrates good strength in BLE but poor activity tolerance, & pain & fatigue limited. Pt would benefit from ongoing PT services to address balance, strength, endurance, to progress bed mobility, transfers, & gait as able & as pt tolerates.     Assistance Recommended at Discharge Frequent or constant Supervision/Assistance  If plan is discharge home, recommend the following:  Can travel by private vehicle    Assistance with cooking/housework;Direct supervision/assist for medications management;Assist for transportation;Help with stairs or ramp for entrance;A lot of help with bathing/dressing/bathroom;A lot of help with walking and/or transfers   No  Equipment Recommendations  Rolling walker (2 wheels)    Recommendations for Other Services       Precautions / Restrictions Precautions Precautions: Fall;Back Spinal Brace Comments: Per neurosurgery note 6/29 do not wear brace Restrictions Weight Bearing Restrictions: No     Mobility  Bed Mobility                    Transfers                        Ambulation/Gait                   Stairs             Wheelchair Mobility     Tilt Bed    Modified Rankin (Stroke Patients Only)       Balance                                             Cognition Arousal/Alertness: Lethargic Behavior During Therapy: WFL for tasks assessed/performed, Flat affect Overall Cognitive Status: No family/caregiver present to determine baseline cognitive functioning                                 General Comments: Pt keeps eyes closed majority of the session, will open them when PT requests pt does so. Pt reports she doesn't feel well 2/2 back pain, declines getting OOB but agreeable to bed level exercises.        Exercises General Exercises - Lower Extremity Ankle Circles/Pumps: AROM, Supine, Both, 10 reps Short Arc Quad: AROM, Supine, Strengthening, Both, 10 reps Heel Slides: Supine, AROM, Strengthening, Both, 10 reps Hip ABduction/ADduction: AROM, Supine, Strengthening, Both, 10 reps (hip abduction slides)    General Comments        Pertinent Vitals/Pain Pain Assessment Pain Assessment: Faces Faces Pain Scale: Hurts little more Pain Location: back Pain Descriptors / Indicators: Discomfort Pain Intervention(s): Monitored during session, Limited activity within patient's tolerance    Home Living  Prior Function            PT Goals (current goals can now be found in the care plan section) Acute Rehab PT Goals Patient Stated Goal: none stated PT Goal Formulation: Patient unable to participate in goal setting Time For Goal Achievement: 04/30/23 Potential to Achieve Goals: Fair Progress towards PT goals: Progressing toward goals    Frequency    Min 3X/week      PT Plan Current plan remains appropriate    Co-evaluation              AM-PAC PT "6 Clicks" Mobility   Outcome Measure  Help needed turning from your back to your side while in a flat bed without using bedrails?: A Little Help needed moving from lying on your back to sitting on the side of a flat bed without using bedrails?: A Little Help needed moving to and from a bed to  a chair (including a wheelchair)?: A Little Help needed standing up from a chair using your arms (e.g., wheelchair or bedside chair)?: A Little Help needed to walk in hospital room?: A Little Help needed climbing 3-5 steps with a railing? : A Lot 6 Click Score: 17    End of Session   Activity Tolerance: Patient limited by lethargy;Patient limited by pain Patient left: in bed;with call bell/phone within reach;with bed alarm set   PT Visit Diagnosis: Difficulty in walking, not elsewhere classified (R26.2);Other abnormalities of gait and mobility (R26.89);Muscle weakness (generalized) (M62.81);Pain Pain - part of body:  (back)     Time: 9147-8295 PT Time Calculation (min) (ACUTE ONLY): 13 min  Charges:    $Therapeutic Exercise: 8-22 mins PT General Charges $$ ACUTE PT VISIT: 1 Visit                     Aleda Grana, PT, DPT 04/23/23, 12:44 PM   Sandi Mariscal 04/23/2023, 12:42 PM

## 2023-04-23 NOTE — Progress Notes (Signed)
Attempted to call daughter(Debbie) to give update

## 2023-04-24 DIAGNOSIS — T8130XA Disruption of wound, unspecified, initial encounter: Secondary | ICD-10-CM | POA: Diagnosis not present

## 2023-04-24 DIAGNOSIS — I4891 Unspecified atrial fibrillation: Secondary | ICD-10-CM | POA: Diagnosis not present

## 2023-04-24 DIAGNOSIS — R627 Adult failure to thrive: Secondary | ICD-10-CM | POA: Diagnosis not present

## 2023-04-24 DIAGNOSIS — Z7189 Other specified counseling: Secondary | ICD-10-CM | POA: Diagnosis not present

## 2023-04-24 DIAGNOSIS — D649 Anemia, unspecified: Secondary | ICD-10-CM | POA: Diagnosis not present

## 2023-04-24 MED ORDER — RIVAROXABAN 15 MG PO TABS
15.0000 mg | ORAL_TABLET | Freq: Every day | ORAL | Status: DC
  Administered 2023-04-24 – 2023-04-28 (×5): 15 mg via ORAL
  Filled 2023-04-24 (×5): qty 1

## 2023-04-24 NOTE — Plan of Care (Signed)
  Problem: Nutrition: Goal: Adequate nutrition will be maintained Outcome: Progressing   Problem: Coping: Goal: Level of anxiety will decrease Outcome: Progressing   Problem: Safety: Goal: Ability to remain free from injury will improve Outcome: Progressing   Problem: Skin Integrity: Goal: Risk for impaired skin integrity will decrease Outcome: Progressing   

## 2023-04-24 NOTE — Progress Notes (Addendum)
Encouraged pt to drink ensure, dietary supplement.  Pt drank around 118 mL out of  237 mL carton.

## 2023-04-24 NOTE — Progress Notes (Addendum)
Progress Note   Patient: Sheryl Michael ZOX:[id number] DOB: 1931-02-23 DOA: 04/14/2023     8 DOS: the patient was seen and examined on 04/24/2023   Brief hospital course:  89 y.o. female with medical history significant for T8-L2 posterior spinal fusion with T8-9 and L1-2 cement augmentation done on March 21, 2023.  Indication seems to have been increasing angulation of L1 acute fracture that was diagnosed before the procedure at approximately Mar 17 2023.  Patient was subsequently discharged on April 05, 2023.    [facility].  History obtained from daughter.  Daughter details that patient has not been eating or mobilizing well.  Is on fairly high-dose narcotics.   Admitted for wound dehiscence and concern for infection   6/29: Case discussed in detail with neurosurgery Dr. Marcell Barlow.  I agree that patient may benefit from return to OR for wound closure and possible VAC placement.  Wound does not appear obviously infected however healing may be difficult without reexploration wound.   6/30: S/p wound exploration.  Vac placed.  No obv sign of infection 7/1: Attempted to eat more but oral intake still remains suboptimal.  TOC looking for alternative placement options 7/3 : Seen and examined during rounds.  Very flat affect.  Continues to have very poor oral intake. 7/4: Wound culture yielded Pseudomonas and MRSA.  Patient on cefepime and vancomycin. 7/7 : Wound Vac removed    Assessment and Plan:  Principal Problem:   Wound dehiscence Active Problems:   Atrial fibrillation with RVR (HCC)   Visit for wound check   Dementia with behavioral disturbance (HCC)   Malnutrition of moderate degree   Surgical wound dehiscence Patient is 4 weeks post T8 L2 posterior spinal fusion.  Procedure was done on 6/4.   Wound did not appear obviously infected however was dehisced on admission Status post wound closure on 6/30 Wound culture from 07/04 yields MRSA and  Pseudomonas.  Patient already on vancomycin and cefepime # 10 Requested ID consultation to discuss duration of antibiotic therapy, recommends 1 more week of oral quinolone and Zyvox if patient is discharged Continue encouraging p.o. intake.  Continue multimodal pain regimen.  Minimize use of narcotics.  Continue therapy evaluations.   Wound VAC removed on 04/23/23   Atrial fibrillation with rapid ventricular response Rate control improved.   Continue home diltiazem Xarelto was resumed on 04/23/23     Dementia with behavioral disturbances Continue Namenda and Lexapro Appreciate psychiatry input     Hypothyroidism Continue Synthroid     Moderate malnutrition Moderate Malnutrition related to social / environmental circumstances as evidenced by mild fat depletion, moderate fat depletion, mild muscle depletion, moderate muscle depletion.  Appreciate dietitian input Continue MVI with minerals daily Continue Ensure Enlive po BID, each supplement provides 350 kcal and 20 grams of protein -D/c Juven     Hyperlipidemia Lipitor     GERD PPI     Adult failure to thrive Patient is severely deconditioned and has very poor oral intake She requires assistance with all activities of daily living Continue Megace     Hypokalemia Supplement potassium Check magnesium level       Subjective: Sitting up in a chair.  Lethargic but arouses easily  Physical Exam: Vitals:   04/23/23 0730 04/23/23 1532 04/23/23 2329 04/24/23 0728  BP: (!) 149/70 (!) 109/47 135/74 (!) 148/53  Pulse: 71 84 73 70  Resp: 16 16 16 15   Temp: 97.7 F (36.5 C) 98 F (36.7  C) 98 F (36.7 C) 98.8 F (37.1 C)  TempSrc:      SpO2: 99% 98% 98% 100%  Weight:      Height:       General exam: NAD.  Frail-appearing Respiratory system: Lungs clear.  Normal work of breathing.  Room air Cardiovascular system: S1-S2, RRR, no murmurs, no pedal edema Gastrointestinal system: Thin, soft, NT/ND, normal bowel  sounds Central nervous system: Lethargic but arouses easily. No focal neurological deficits. Extremities: Symmetric 5 x 5 power. Skin: No rashes, lesions or ulcers Psychiatry: Judgement and insight appear impaired. Mood & affect flat    Data Reviewed:  There are no new results to review at this time.  Family Communication: None  Disposition: Status is: Inpatient Remains inpatient appropriate because: Awaiting discharge to SNF  Planned Discharge Destination: Skilled nursing facility    Time spent: 33 minutes  Author: Lucile Shutters, MD 04/24/2023 1:39 PM  For on call review [email or web address].

## 2023-04-24 NOTE — Care Management Important Message (Signed)
Important Message  Patient Details  Name: ZAINA KEMMERLING MRN: [id number] Date of Birth: 1930/11/27   Medicare Important Message Given:  Other (see comment)  Patient is in an isolation room so I called to review her Important Message from Medicare with her [phone]) but there was no answer. Will try again.   [name] A Thayden Lemire 04/24/2023, 2:40 PM

## 2023-04-24 NOTE — Progress Notes (Addendum)
Daily Progress Note   Patient Name: Sheryl Michael       Date: 04/24/2023 DOB: 19-Jun-1931  Age: 89 y.o. MRN#: [id number] Attending Physician: Lucile Shutters, MD Primary Care Physician: Marguarite Arbour, MD Admit Date: 04/14/2023  Reason for Consultation/Follow-up: Establishing goals of care  Subjective: Notes and labs reviewed.  Per PT/OT notes, patient is having poor activity tolerance and pain.  In to see patient.  She is sitting in the bedside chair, and appears uncomfortable.  She attempts to adjust in her bedside chair and grimaces and moans.  She states she is having back pain and is cold.  Patient tells me that she has poor p.o. take, but states she is in pain and does not want to eat.   Blankets provided by staff.   Patient has PRN Tylenol for mild pain, and PRN oxycodone ordered for moderate or severe pain.  Discussed with nursing.  Length of Stay: 8  Current Medications: Scheduled Meds:   anastrozole  1 mg Oral Daily   atorvastatin  40 mg Oral Daily   cholecalciferol  500 Units Oral Daily   diltiazem  180 mg Oral Daily   docusate sodium  100 mg Oral BID   escitalopram  10 mg Oral Daily   feeding supplement  237 mL Oral BID BM   levothyroxine  50 mcg Oral Q0600   memantine  5 mg Oral BID   multivitamin with minerals  1 tablet Oral Daily   nutrition supplement (JUVEN)  1 packet Oral BID BM   pantoprazole  40 mg Oral Daily   Rivaroxaban  15 mg Oral Daily   sodium chloride flush  3 mL Intravenous Q12H    Continuous Infusions:  sodium chloride 40 mL/hr at 04/23/23 1920   ceFEPime (MAXIPIME) IV 2 g (04/24/23 1017)   vancomycin Stopped (04/23/23 1428)    PRN Meds: acetaminophen **OR** acetaminophen, albuterol, ondansetron, oxyCODONE, polyethylene glycol  Physical  Exam Pulmonary:     Effort: Pulmonary effort is normal.  Neurological:     Mental Status: She is alert.             Vital Signs: BP (!) 148/53   Pulse 70   Temp 98.8 F (37.1 C)   Resp 15   Ht 5\' 1"  (1.549 m)   Wt 56.7 kg  SpO2 100%   BMI 23.62 kg/m  SpO2: SpO2: 100 % O2 Device: O2 Device: Room Air O2 Flow Rate:    Intake/output summary:  Intake/Output Summary (Last 24 hours) at 04/24/2023 1216 Last data filed at 04/24/2023 1042 Gross per 24 hour  Intake 422.18 ml  Output 1400 ml  Net -977.82 ml   LBM: Last BM Date : 04/21/23 Baseline Weight: Weight: 56.7 kg Most recent weight: Weight: 56.7 kg    Patient Active Problem List   Diagnosis Date Noted   Malnutrition of moderate degree 04/17/2023   Wound dehiscence 04/14/2023   Visit for wound check 04/14/2023   Dementia with behavioral disturbance (HCC) 04/14/2023   COVID-19 virus infection 03/30/2023   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   Kyphosis 03/19/2023   Closed tricolumnar fracture of thoracic vertebra (HCC) 03/19/2023   Thoracic spine fracture (HCC) 03/18/2023   Inadequate pain control 03/17/2023   Closed T11 fracture (HCC) 03/17/2023   Depression 03/17/2023   Genetic testing 05/04/2021   History of breast cancer 08/27/2020   Family history of breast cancer    Carcinoma of lower-outer quadrant of left breast in female, estrogen receptor positive (HCC) 07/23/2020   Recurrent UTI 05/05/2020   Acute pyelonephritis 05/05/2020   Acute kidney injury superimposed on chronic kidney disease (HCC) 05/05/2020   History of recurrent UTIs 03/07/2020   Sepsis (HCC) 03/05/2020   Strep throat 12/19/2018   Allergic reaction 12/19/2018   Sprain of interphalangeal joint of right ring finger 07/30/2018   Traumatic complete tear of left rotator cuff 07/16/2018   Strain of left hip 07/16/2018   Encounter for Hemoccult screening 03/12/2018   Hypothyroidism, acquired 07/10/2017   Arrhythmia 05/31/2017   Atrial  fibrillation with RVR (HCC) 05/30/2017   Dyspnea 05/30/2017   Rotator cuff tendinitis, right 12/05/2016   Shingles 12/29/2015   S/P VH (vaginal hysterectomy) 12/22/2015   Cystocele 12/21/2015   Colon polyp 10/07/2015   Bladder cystocele 10/07/2015   Lower esophageal ring 10/07/2015   H/O neoplasm 08/17/2015   History of nonmelanoma skin cancer 08/17/2015   Malignant neoplasm of breast (HCC) 04/01/2014   GERD (gastroesophageal reflux disease) 04/01/2014   HLD (hyperlipidemia) 04/01/2014   OP (osteoporosis) 04/01/2014   Temporary cerebral vascular dysfunction 04/01/2014   H/O gastrointestinal disease 02/28/2014    Palliative Care Assessment & Plan    Recommendations/Plan: Patient complaining of pain: she has PRN Tylenol for mild pain, and PRN oxycodone ordered at 2.5 mg for moderate or 5mg  for severe pain.  If there is concern for lethargy, primary team could consider transitioning to hydrocodone, Tramadol, or oral Morphine at low dosing.  Could also consider initiating a TENS unit for back pain, which could be facilitated by physical therapy team.  Code Status:    Code Status Orders  (From admission, onward)           Start     Ordered   04/15/23 0931  Do not attempt resuscitation (DNR)  Continuous       Question Answer Comment  If patient has no pulse and is not breathing Do Not Attempt Resuscitation   If patient has a pulse and/or is breathing: Medical Treatment Goals LIMITED ADDITIONAL INTERVENTIONS: Use medication/IV fluids and cardiac monitoring as indicated; Do not use intubation or mechanical ventilation (DNI), also provide comfort medications.  Transfer to Progressive/Stepdown as indicated, avoid Intensive Care.   Consent: Discussion documented in EHR or advanced directives reviewed      04/15/23 0930  Code Status History     Date Active Date Inactive Code Status Order ID Comments User Context   04/14/2023 2332 04/15/2023 0930 Full Code [id number]   Nolberto Hanlon, MD ED   03/17/2023 1638 04/05/2023 2354 Full Code [id number]  Cox, Amy N, DO ED   05/05/2020 0221 05/07/2020 2027 Partial Code [id number]  Mansy, Vernetta Honey, MD ED   03/05/2020 0318 03/08/2020 0018 Partial Code [id number]  Judithe Modest, NP ED   03/05/2020 0224 03/05/2020 0317 Full Code [id number]  Judithe Modest, NP ED   12/19/2018 0432 12/20/2018 1630 Full Code [id number]  Oralia Manis, MD ED   05/30/2017 2032 05/31/2017 1658 Full Code [id number]  Katharina Caper, MD Inpatient   12/21/2015 1132 12/22/2015 1807 Full Code [id number]  Defrancesco, Prentice Docker, MD Inpatient      Advance Directive Documentation    Flowsheet Row Most Recent Value  Type of Advance Directive Living will  Pre-existing out of facility DNR order (yellow form or pink MOST form) --  "MOST" Form in Place? --     Care plan was discussed with attending and RN  Thank you for allowing the Palliative Medicine Team to assist in the care of this patient.    Morton Stall, NP  Please contact Palliative Medicine Team phone at [phone] for questions and concerns.

## 2023-04-24 NOTE — Progress Notes (Signed)
Encouraged patient to eat lunch. Pt refused.

## 2023-04-24 NOTE — Plan of Care (Signed)

## 2023-04-24 NOTE — Progress Notes (Signed)
Occupational Therapy Treatment Patient Details Name: Sheryl Michael MRN: [id number] DOB: [birth year] Today's Date: 04/24/2023   History of present illness [name] presented with a wound dehiscence after a prior spine surgery.   She was brought back to the emergency department for evaluation.  She had no evidence of infection at that time.  Due to wound dehiscence, irrigation and debridement with reclosure was recommended. Pt underwent Irrigation and debridement of the thoracic and lumbar wound on 04/16/2023; prior spinal surgery (03/20/2023).   OT comments  Sheryl Michael was seen for OT treatment on this date. Upon arrival to room pt supine in bed, notably lethargic but wakes with VCs. OT facilitated ADL management as described below. See ADL section for additional details regarding occupational performance. Pt continues to be functionally limited by lethargy, pain, decreased balance, and decreased safety awareness. Educated on back precautions and log-roll technique for bed mobility as she demonstrates limited recall from previous sessions. Pt is progressing toward OT goals and continues to benefit from skilled OT services to maximize return to PLOF and minimize risk of future falls, injury, caregiver burden, and readmission. Will continue to follow POC as written. Discharge recommendation remains appropriate.    Recommendations for follow up therapy are one component of a multi-disciplinary discharge planning process, led by the attending physician.  Recommendations may be updated based on patient status, additional functional criteria and insurance authorization.    Assistance Recommended at Discharge Frequent or constant Supervision/Assistance  Patient can return home with the following  A little help with walking and/or transfers;A lot of help with bathing/dressing/bathroom;Assistance with cooking/housework;Assist for transportation;Help with stairs or ramp for entrance   Equipment  Recommendations  BSC/3in1    Recommendations for Other Services      Precautions / Restrictions Precautions Precautions: Fall;Back Precaution Comments: Hemovac removed Spinal Brace Comments: per neurosurgery note 6/29 do not wear brace Restrictions Weight Bearing Restrictions: No       Mobility Bed Mobility Overal bed mobility: Needs Assistance Bed Mobility: Rolling, Sidelying to Sit Rolling: Min assist Sidelying to sit: Min assist, +2 for safety/equipment       General bed mobility comments: Min A for trunk elevation with cueing for implementation of log-roll technique, increased time required    Transfers Overall transfer level: Needs assistance Equipment used: Rolling walker (2 wheels) Transfers: Sit to/from Stand, Bed to chair/wheelchair/BSC Sit to Stand: Min assist, +2 safety/equipment     Step pivot transfers: Min assist, +2 safety/equipment           Balance Overall balance assessment: Needs assistance Sitting-balance support: Feet supported Sitting balance-Leahy Scale: Good Sitting balance - Comments: steady reaching within BOS   Standing balance support: Bilateral upper extremity supported, During functional activity Standing balance-Leahy Scale: Fair Standing balance comment: Min A required; 1-2 episodes of increased postural sway/unsteadiness during standing.                           ADL either performed or assessed with clinical judgement   ADL Overall ADL's : Needs assistance/impaired                     Lower Body Dressing: Sit to/from stand;Maximal assistance;+2 for physical assistance Lower Body Dressing Details (indicate cue type and reason): MAX A to doff/don mesh underwear with +2 providing MIN A for standing balance during STS. Toilet Transfer: Minimal assistance;+2 for physical assistance Toilet Transfer Details (indicate cue type and reason): +2  HHA for simulated toilet transfer to recliner Toileting- Clothing  Manipulation and Hygiene: Maximal assistance Toileting - Clothing Manipulation Details (indicate cue type and reason): MAX A for peri-care mgt after pt noted with minimal bowel incontenence during functional transfer.     Functional mobility during ADLs: Minimal assistance;+2 for physical assistance      Extremity/Trunk Assessment         Cervical / Trunk Assessment Cervical / Trunk Assessment: Back Surgery    Vision Baseline Vision/History: 1 Wears glasses Patient Visual Report: No change from baseline     Perception     Praxis      Cognition Arousal/Alertness: Lethargic Behavior During Therapy: WFL for tasks assessed/performed, Flat affect Overall Cognitive Status: No family/caregiver present to determine baseline cognitive functioning                                 General Comments: Pt continues to keep eyes closed majority of the session, will open intermittent with mobility.        Exercises Other Exercises Other Exercises: Pt educated on log-roll technique for bed mobility. OT faciliated ADL management with assist and education as described above.    Shoulder Instructions       General Comments      Pertinent Vitals/ Pain       Pain Assessment Pain Assessment: Faces Faces Pain Scale: Hurts little more Pain Location: back Pain Descriptors / Indicators: Discomfort, Guarding, Grimacing Pain Intervention(s): Limited activity within patient's tolerance, Monitored during session, Repositioned  Home Living                                          Prior Functioning/Environment              Frequency  Min 1X/week        Progress Toward Goals  OT Goals(current goals can now be found in the care plan section)  Progress towards OT goals: Progressing toward goals  Acute Rehab OT Goals Patient Stated Goal: to feel better OT Goal Formulation: With patient Time For Goal Achievement: 04/30/23 Potential to Achieve  Goals: Good  Plan Discharge plan remains appropriate;Frequency remains appropriate    Co-evaluation      Reason for Co-Treatment: Necessary to address cognition/behavior during functional activity;For patient/therapist safety;To address functional/ADL transfers PT goals addressed during session: Mobility/safety with mobility;Balance        AM-PAC OT "6 Clicks" Daily Activity     Outcome Measure   Help from another person eating meals?: A Little Help from another person taking care of personal grooming?: A Little Help from another person toileting, which includes using toliet, bedpan, or urinal?: A Lot Help from another person bathing (including washing, rinsing, drying)?: A Lot Help from another person to put on and taking off regular upper body clothing?: A Little Help from another person to put on and taking off regular lower body clothing?: A Lot 6 Click Score: 15    End of Session Equipment Utilized During Treatment: Rolling walker (2 wheels)  OT Visit Diagnosis: Other abnormalities of gait and mobility (R26.89);Muscle weakness (generalized) (M62.81);History of falling (Z91.81);Pain Pain - part of body:  (back)   Activity Tolerance Patient tolerated treatment well   Patient Left in chair;with call bell/phone within reach;with chair alarm set   Nurse Communication Mobility status  Time: 1010-1018 OT Time Calculation (min): 8 min  Charges: OT General Charges $OT Visit: 1 Visit OT Treatments $Self Care/Home Management : 8-22 mins  Rockney Ghee, M.S., OTR/L 04/24/23, 12:43 PM

## 2023-04-24 NOTE — Progress Notes (Signed)
Date of Admission:  04/14/2023     ID: Sheryl Michael is a 89 y.o. female  Principal Problem:   Wound dehiscence Active Problems:   Atrial fibrillation with RVR (HCC)   Visit for wound check   Dementia with behavioral disturbance (HCC)   Malnutrition of moderate degree    Subjective: Has not been eating   Medications:   anastrozole  1 mg Oral Daily   atorvastatin  40 mg Oral Daily   cholecalciferol  500 Units Oral Daily   diltiazem  180 mg Oral Daily   docusate sodium  100 mg Oral BID   escitalopram  10 mg Oral Daily   feeding supplement  237 mL Oral BID BM   levothyroxine  50 mcg Oral Q0600   memantine  5 mg Oral BID   multivitamin with minerals  1 tablet Oral Daily   nutrition supplement (JUVEN)  1 packet Oral BID BM   pantoprazole  40 mg Oral Daily   Rivaroxaban  15 mg Oral Daily   sodium chloride flush  3 mL Intravenous Q12H    Objective: Vital signs in last 24 hours: Patient Vitals for the past 24 hrs:  BP Temp Pulse Resp SpO2  04/24/23 0728 (!) 148/53 98.8 F (37.1 C) 70 15 100 %  04/23/23 2329 135/74 98 F (36.7 C) 73 16 98 %  04/23/23 1532 (!) 109/47 98 F (36.7 C) 84 16 98 %     PHYSICAL EXAM:  General: Alert, cooperative, very frail Lungs: b/l air entry Heart: s1s2 Abdomen: Soft, non-tender,not distended. Bowel sounds normal. No masses Extremities: atraumatic, no cyanosis. No edema. No clubbing Back- surgical site - sutures intact  Lymph: Cervical, supraclavicular normal. Neurologic: Grossly non-focal  Lab Results    Latest Ref Rng & Units 04/23/2023    4:20 AM 04/17/2023    9:57 AM 04/15/2023    5:38 AM  CBC  WBC 4.0 - 10.5 K/uL 7.5  5.5  5.9   Hemoglobin 12.0 - 15.0 g/dL 16.1  9.6  09.6   Hematocrit 36.0 - 46.0 % 38.7  30.4  33.6   Platelets 150 - 400 K/uL 263  228  255        Latest Ref Rng & Units 04/23/2023    4:20 AM 04/22/2023   11:25 AM 04/20/2023    3:49 AM  CMP  Glucose 70 - 99 mg/dL 86   045   BUN 8 - 23 mg/dL 16   21    Creatinine 4.09 - 1.00 mg/dL 8.11  9.14  7.82   Sodium 135 - 145 mmol/L 138   137   Potassium 3.5 - 5.1 mmol/L 3.3   3.8   Chloride 98 - 111 mmol/L 107   105   CO2 22 - 32 mmol/L 20   21   Calcium 8.9 - 10.3 mg/dL 9.1   8.8       Microbiology: 04/16/23 surgical culture No growth 04/14/23 BC - NG 04/14/23 superficial culture MRSA/pseudomonas Studies/Results: No results found.   Assessment/Plan: Superficial wound dehiscence of the thoracic lumbar surgical wound. No deep infection Cultures from the surgical procedure was negative The superficial culture done in the ED was MRSA and Pseudomonas but there were no WBCs seen on the smear and hence this likely was colonization.  But because of recent surgery and to prevent deep infection will treat both the organism for a total of 2 weeks.  She is currently on cefepime and vancomycin.  Can do  Levaquin and linezolid on discharge until  04/28/2023   History of fall with fracture of the TL and L1 spine status post fusion surgery   CA breast left status postlumpectomy and radiation.  On anastrozole currently   A-fib on diltiazem ? Anemia   Frailty   Poor intake   Failure to thrive _   _Discussed the management with patient and care team_________________________________

## 2023-04-24 NOTE — Progress Notes (Signed)
Physical Therapy Treatment Patient Details Name: Sheryl Michael MRN: [id number] DOB: 14-Feb-1931 Today's Date: 04/24/2023   History of Present Illness [name] presented with a wound dehiscence after a prior spine surgery.   She was brought back to the emergency department for evaluation.  She had no evidence of infection at that time.  Due to wound dehiscence, irrigation and debridement with reclosure was recommended. Pt underwent Irrigation and debridement of the thoracic and lumbar wound on 04/16/2023; prior spinal surgery (03/20/2023).    PT Comments  Patient arrived supine in bed, lethargic but awakes with verbal/tactile cues. Patient continue to demonstrate poor alertness and engagement with activities. Patient continues to require Min A for bed mobility, and Min A +2 to stand and take lateral steps to recliner. Pt continues to be limited throughout session due to lethargy and pain. Patient left seated in recliner at end of session with nursing staff present, all needs met. Pt is making progress toward PT goals and will continue to benefit from skilled acute PT services to maximize mobility and return to PLOF. Discharge recommendation remains appropriate at this time.     Assistance Recommended at Discharge Frequent or constant Supervision/Assistance  If plan is discharge home, recommend the following:  Can travel by private vehicle    Assistance with cooking/housework;Direct supervision/assist for medications management;Assist for transportation;Help with stairs or ramp for entrance;A lot of help with bathing/dressing/bathroom;A lot of help with walking and/or transfers   No  Equipment Recommendations  Rolling walker (2 wheels)    Recommendations for Other Services       Precautions / Restrictions Precautions Precautions: Fall;Back Precaution Comments:  (hemovac removed) Spinal Brace Comments: per neurosurgery note 6/29 do not wear brace Restrictions Weight Bearing  Restrictions: No     Mobility  Bed Mobility Overal bed mobility: Needs Assistance Bed Mobility: Rolling, Sidelying to Sit Rolling: Min assist Sidelying to sit: Min assist, +2 for safety/equipment       General bed mobility comments: Min A for trunk elevation with cueing for implementation of log-roll technique, increased time required    Transfers Overall transfer level: Needs assistance Equipment used: Rolling walker (2 wheels) Transfers: Sit to/from Stand, Bed to chair/wheelchair/BSC Sit to Stand: Min assist, +2 safety/equipment   Step pivot transfers: Min assist, +2 safety/equipment       General transfer comment: Min A +2 and RW for safety. Increased time to come to standing but able to complete, continue to have inconsistent eye opening throughout mobility. Pt agreeable to transfer from bed > recliner; OT assisting to remove soiled brief and donn clean brief; assisted with 2nd attempt to sit > stand with Min A and HHA, while OT completing toilet hygiene and donn brief. Verbal cues for sequencing and posture with mobility    Ambulation/Gait Ambulation/Gait assistance: Min assist, +2 physical assistance Gait Distance (Feet): 4 Feet Assistive device: Rolling walker (2 wheels) Gait Pattern/deviations: Step-to pattern, Decreased step length - right, Decreased step length - left Gait velocity: decreased     General Gait Details: Able to laterally side step with RW and min A +2, poor engagement throughout   Stairs             Wheelchair Mobility     Tilt Bed    Modified Rankin (Stroke Patients Only)       Balance Overall balance assessment: Needs assistance Sitting-balance support: Feet supported Sitting balance-Leahy Scale: Good     Standing balance support: Bilateral upper extremity supported, During functional  activity Standing balance-Leahy Scale: Fair Standing balance comment: Min A required; 1-2 episodes of increased postural sway/unsteadiness  during standing.                            Cognition Arousal/Alertness: Lethargic Behavior During Therapy: WFL for tasks assessed/performed, Flat affect Overall Cognitive Status: No family/caregiver present to determine baseline cognitive functioning                         Following Commands: Follows one step commands with increased time       General Comments: Pt continues to keep eyes closed majority of the session, will open intermittent with mobility.        Exercises      General Comments        Pertinent Vitals/Pain Pain Assessment Pain Assessment: Faces Faces Pain Scale: Hurts little more Pain Location: back Pain Descriptors / Indicators: Discomfort Pain Intervention(s): Limited activity within patient's tolerance, Monitored during session    Home Living                          Prior Function            PT Goals (current goals can now be found in the care plan section) Acute Rehab PT Goals Patient Stated Goal: none stated PT Goal Formulation: Patient unable to participate in goal setting Time For Goal Achievement: 04/30/23 Potential to Achieve Goals: Fair Progress towards PT goals: Progressing toward goals    Frequency    Min 1X/week (implementation of Patient-Centered Delivery of Care Model guidelines)      PT Plan Current plan remains appropriate    Co-evaluation PT/OT/SLP Co-Evaluation/Treatment: Yes Reason for Co-Treatment: Necessary to address cognition/behavior during functional activity;For patient/therapist safety;To address functional/ADL transfers PT goals addressed during session: Mobility/safety with mobility;Balance        AM-PAC PT "6 Clicks" Mobility   Outcome Measure  Help needed turning from your back to your side while in a flat bed without using bedrails?: A Little Help needed moving from lying on your back to sitting on the side of a flat bed without using bedrails?: A Little Help needed  moving to and from a bed to a chair (including a wheelchair)?: A Little Help needed standing up from a chair using your arms (e.g., wheelchair or bedside chair)?: A Little Help needed to walk in hospital room?: A Little Help needed climbing 3-5 steps with a railing? : A Lot 6 Click Score: 17    End of Session Equipment Utilized During Treatment: Gait belt Activity Tolerance: Patient limited by lethargy;Patient limited by pain Patient left: in chair;with chair alarm set;with call bell/phone within reach;with nursing/sitter in room Nurse Communication: Mobility status PT Visit Diagnosis: Difficulty in walking, not elsewhere classified (R26.2);Other abnormalities of gait and mobility (R26.89);Muscle weakness (generalized) (M62.81);Pain     Time: 1001-1009 PT Time Calculation (min) (ACUTE ONLY): 8 min  Charges:    $Therapeutic Activity: 8-22 mins                       Creed Copper Fairly, PT, DPT 04/24/23 11:34 AM

## 2023-04-25 DIAGNOSIS — Z7189 Other specified counseling: Secondary | ICD-10-CM | POA: Diagnosis not present

## 2023-04-25 DIAGNOSIS — T8130XA Disruption of wound, unspecified, initial encounter: Secondary | ICD-10-CM | POA: Diagnosis not present

## 2023-04-25 LAB — CREATININE, SERUM
Creatinine, Ser: 0.68 mg/dL (ref 0.44–1.00)
GFR, Estimated: >60 mL/min (ref >60)

## 2023-04-25 NOTE — Progress Notes (Signed)
Progress Note   Patient: Sheryl Michael ZOX:[id number] DOB: 1931/02/04 DOA: 04/14/2023     9 DOS: the patient was seen and examined on 04/25/2023   Brief hospital course: 89 y.o. female with medical history significant for T8-L2 posterior spinal fusion with T8-9 and L1-2 cement augmentation done on March 21, 2023.  Indication seems to have been increasing angulation of L1 acute fracture that was diagnosed before the procedure at approximately Mar 17 2023.  Patient was subsequently discharged on April 05, 2023.    [facility]  History obtained from daughter.  Daughter details that patient has not been eating or mobilizing well.  Is on fairly high-dose narcotics.   Admitted for wound dehiscence and concern for infection   6/29: Case discussed in detail with neurosurgery Dr. Marcell Barlow.  I agree that patient may benefit from return to OR for wound closure and possible VAC placement.  Wound does not appear obviously infected however healing may be difficult without reexploration wound.   6/30: S/p wound exploration.  Vac placed.  No obv sign of infection 7/1: Attempted to eat more but oral intake still remains suboptimal.  TOC looking for alternative placement options 7/3 : Seen and examined during rounds.  Very flat affect.  Continues to have very poor oral intake. 7/4: Wound culture yielded Pseudomonas and MRSA.  Patient on cefepime and vancomycin. 7/7 : Wound Vac removed  Assessment and Plan:  Surgical wound dehiscence Patient is 4 weeks post T8 L2 posterior spinal fusion.  Procedure was done on 6/4.   Wound did not appear obviously infected however was dehisced on admission Status post wound closure on 6/30 Wound culture from 07/04 yields MRSA and Pseudomonas.  Patient already on vancomycin and cefepime # 11 Requested ID consultation to discuss duration of antibiotic therapy, recommends 1 more week of oral quinolone and Zyvox if patient is  discharged Total duration of antibiotic therapy is 2 weeks and so if patient completes 2 weeks of IV vancomycin and cefepime in-house, she will not require oral antibiotics upon discharge. Continue encouraging p.o. intake.  Continue multimodal pain regimen.  Minimize use of narcotics.  Continue physical therapy evaluations.   Wound VAC removed on 04/23/23     Atrial fibrillation with rapid ventricular response Rate control improved.   Continue home diltiazem Xarelto was resumed on 04/23/23      Dementia with behavioral disturbances Continue Namenda and Lexapro Appreciate psychiatry input     Hypothyroidism Continue Synthroid     Moderate malnutrition Moderate Malnutrition related to social / environmental circumstances as evidenced by mild fat depletion, moderate fat depletion, mild muscle depletion, moderate muscle depletion.  Appreciate dietitian input Continue MVI with minerals daily Continue Ensure Enlive po BID, each supplement provides 350 kcal and 20 grams of protein -D/c Juven     Hyperlipidemia Lipitor     GERD PPI     Adult failure to thrive Patient is severely deconditioned and has very poor oral intake She requires assistance with all activities of daily living Continue Megace     Hypokalemia Supplement potassium Check magnesium level       Subjective: Patient is seen and examined at the bedside.  Opens eyes to name call.  Requesting for something to help her have a bowel movement.  Physical Exam: Vitals:   04/24/23 0728 04/24/23 1515 04/24/23 2321 04/25/23 0920  BP: (!) 148/53 (!) 111/31 (!) 138/44 (!) 145/48  Pulse: 70 62 62 65  Resp: 15 16 17  14  Temp: 98.8 F (37.1 C) 98.1 F (36.7 C) 97.9 F (36.6 C) 98.1 F (36.7 C)  TempSrc:      SpO2: 100% 99% 99% 98%  Weight:      Height:       General exam: NAD.  Frail-appearing Respiratory system: Lungs clear.  Normal work of breathing.  Room air Cardiovascular system: S1-S2, RRR, no  murmurs, no pedal edema Gastrointestinal system: Thin, soft, NT/ND, normal bowel sounds Central nervous system: Lethargic but arouses easily. No focal neurological deficits. Extremities: Symmetric 5 x 5 power. Skin: No rashes, lesions or ulcers Psychiatry: Judgement and insight appear impaired. Mood & affect flat    Data Reviewed:  There are no new results to review at this time.  Family Communication: None  Disposition: Status is: Inpatient Remains inpatient appropriate because: Awaiting discharge to skilled nursing facility for long-term care  Planned Discharge Destination: Skilled nursing facility    Time spent: 33 minutes  Author: Lucile Shutters, MD 04/25/2023 12:31 PM  For on call review [email or web address].

## 2023-04-25 NOTE — Progress Notes (Signed)
Daily Progress Note   Patient Name: Sheryl Michael       Date: 04/25/2023 DOB: 1931/09/21  Age: 89 y.o. MRN#: [id number] Attending Physician: Lucile Shutters, MD Primary Care Physician: Marguarite Arbour, MD Admit Date: 04/14/2023  Reason for Consultation/Follow-up: Establishing goals of care  Subjective: Notes and labs reviewed.  In to see patient.  She states she does not have back pain today, but has pain in her buttock, which is mild. She states that she just does not feel like eating and states that when she gets out of the bed all she wants to do is get back in bed.  Attempted to discuss her wishes and she states that she would rather her daughter make decisions for her.   Called to speak with her daughter, she states she will have her brother come to the hospital to facilitate facetiming a family meeting with patient. Will f/u tomorrow.    Length of Stay: 9  Current Medications: Scheduled Meds:   anastrozole  1 mg Oral Daily   atorvastatin  40 mg Oral Daily   cholecalciferol  500 Units Oral Daily   diltiazem  180 mg Oral Daily   docusate sodium  100 mg Oral BID   escitalopram  10 mg Oral Daily   feeding supplement  237 mL Oral BID BM   levothyroxine  50 mcg Oral Q0600   memantine  5 mg Oral BID   multivitamin with minerals  1 tablet Oral Daily   pantoprazole  40 mg Oral Daily   Rivaroxaban  15 mg Oral Daily   sodium chloride flush  3 mL Intravenous Q12H    Continuous Infusions:  ceFEPime (MAXIPIME) IV 2 g (04/25/23 1022)   vancomycin 750 mg (04/25/23 1245)    PRN Meds: acetaminophen **OR** acetaminophen, albuterol, ondansetron, oxyCODONE, polyethylene glycol  Physical Exam Pulmonary:     Effort: Pulmonary effort is normal.  Neurological:     Mental Status: She is  alert.             Vital Signs: BP (!) 145/48 (BP Location: Left Arm)   Pulse 65   Temp 98.1 F (36.7 C)   Resp 14   Ht 5\' 1"  (1.549 m)   Wt 56.7 kg   SpO2 98%   BMI 23.62 kg/m  SpO2: SpO2:  98 % O2 Device: O2 Device: Room Air O2 Flow Rate:    Intake/output summary:  Intake/Output Summary (Last 24 hours) at 04/25/2023 1513 Last data filed at 04/25/2023 1448 Gross per 24 hour  Intake --  Output 900 ml  Net -900 ml   LBM: Last BM Date : 04/24/23 Baseline Weight: Weight: 56.7 kg Most recent weight: Weight: 56.7 kg     Patient Active Problem List   Diagnosis Date Noted   Malnutrition of moderate degree 04/17/2023   Wound dehiscence 04/14/2023   Visit for wound check 04/14/2023   Dementia with behavioral disturbance (HCC) 04/14/2023   COVID-19 virus infection 03/30/2023   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   Kyphosis 03/19/2023   Closed tricolumnar fracture of thoracic vertebra (HCC) 03/19/2023   Thoracic spine fracture (HCC) 03/18/2023   Inadequate pain control 03/17/2023   Closed T11 fracture (HCC) 03/17/2023   Depression 03/17/2023   Genetic testing 05/04/2021   History of breast cancer 08/27/2020   Family history of breast cancer    Carcinoma of lower-outer quadrant of left breast in female, estrogen receptor positive (HCC) 07/23/2020   Recurrent UTI 05/05/2020   Acute pyelonephritis 05/05/2020   Acute kidney injury superimposed on chronic kidney disease (HCC) 05/05/2020   History of recurrent UTIs 03/07/2020   Sepsis (HCC) 03/05/2020   Strep throat 12/19/2018   Allergic reaction 12/19/2018   Sprain of interphalangeal joint of right ring finger 07/30/2018   Traumatic complete tear of left rotator cuff 07/16/2018   Strain of left hip 07/16/2018   Encounter for Hemoccult screening 03/12/2018   Hypothyroidism, acquired 07/10/2017   Arrhythmia 05/31/2017   Atrial fibrillation with RVR (HCC) 05/30/2017   Dyspnea 05/30/2017   Rotator cuff tendinitis, right  12/05/2016   Shingles 12/29/2015   S/P VH (vaginal hysterectomy) 12/22/2015   Cystocele 12/21/2015   Colon polyp 10/07/2015   Bladder cystocele 10/07/2015   Lower esophageal ring 10/07/2015   H/O neoplasm 08/17/2015   History of nonmelanoma skin cancer 08/17/2015   Malignant neoplasm of breast (HCC) 04/01/2014   GERD (gastroesophageal reflux disease) 04/01/2014   HLD (hyperlipidemia) 04/01/2014   OP (osteoporosis) 04/01/2014   Temporary cerebral vascular dysfunction 04/01/2014   H/O gastrointestinal disease 02/28/2014    Palliative Care Assessment & Plan    Recommendations/Plan: PMT will f/u tomorrow.   Code Status:    Code Status Orders  (From admission, onward)           Start     Ordered   04/15/23 0931  Do not attempt resuscitation (DNR)  Continuous       Question Answer Comment  If patient has no pulse and is not breathing Do Not Attempt Resuscitation   If patient has a pulse and/or is breathing: Medical Treatment Goals LIMITED ADDITIONAL INTERVENTIONS: Use medication/IV fluids and cardiac monitoring as indicated; Do not use intubation or mechanical ventilation (DNI), also provide comfort medications.  Transfer to Progressive/Stepdown as indicated, avoid Intensive Care.   Consent: Discussion documented in EHR or advanced directives reviewed      04/15/23 0930           Code Status History     Date Active Date Inactive Code Status Order ID Comments User Context   04/14/2023 2332 04/15/2023 0930 Full Code [id number]  Nolberto Hanlon, MD ED   03/17/2023 1638 04/05/2023 2354 Full Code [id number]  Cox, Amy N, DO ED   05/05/2020 0221 05/07/2020 2027 Partial Code [id number]  Mansy, Vernetta Honey, MD ED   03/05/2020  0318 03/08/2020 0018 Partial Code [id number]  Judithe Modest, NP ED   03/05/2020 0224 03/05/2020 0317 Full Code [id number]  Judithe Modest, NP ED   12/19/2018 0432 12/20/2018 1630 Full Code [id number]  Oralia Manis, MD ED   05/30/2017 2032 05/31/2017 1658 Full Code [id number]   Katharina Caper, MD Inpatient   12/21/2015 1132 12/22/2015 1807 Full Code [id number]  Defrancesco, Prentice Docker, MD Inpatient      Advance Directive Documentation    Flowsheet Row Most Recent Value  Type of Advance Directive Living will  Pre-existing out of facility DNR order (yellow form or pink MOST form) --  "MOST" Form in Place? --       Prognosis: Poor overall   Thank you for allowing the Palliative Medicine Team to assist in the care of this patient.    Morton Stall, NP  Please contact Palliative Medicine Team phone at [phone] for questions and concerns.

## 2023-04-25 NOTE — Progress Notes (Signed)
Nutrition Follow-up  DOCUMENTATION CODES:   Non-severe (moderate) malnutrition in context of chronic illness  INTERVENTION:   -Continue MVI with minerals daily -Continue Ensure Enlive po BID, each supplement provides 350 kcal and 20 grams of protein -Magic cup TID with meals, each supplement provides 290 kcal and 9 grams of protein  -Feeding assistance with meals -Obtain new wt  NUTRITION DIAGNOSIS:   Moderate Malnutrition related to social / environmental circumstances as evidenced by mild fat depletion, moderate fat depletion, mild muscle depletion, moderate muscle depletion.  Ongoing  GOAL:   Patient will meet greater than or equal to 90% of their needs  Unmet  MONITOR:   PO intake, Supplement acceptance  REASON FOR ASSESSMENT:   Consult Assessment of nutrition requirement/status  ASSESSMENT:   89 y.o. female admits related to wound check. PMH includes: arthritis, afib, GERD, HLD, HTN. Pt is currently receiving medical management related to wound dehiscence.  6/30- s/p Irrigation and debridement of the thoracic and lumbar wound  7/3- hemovac removed 7/7- wound vac d/c  Reviewed I/O's: -850 ml x 24 hours and +553 ml since admission  UOP: 850 ml x 24 hours  Pt remains with poor oral intake. Noted meal completions 0%. Pt has been refusing meals. Per RN, she drank about 50% of Ensure supplement yesterday with max encouragement.    Per palliative care notes, plan to treat the treatable, but no CPR or intubation. Plan to allow time for outcomes. Pain medications have been adjust for comfort on 04/24/23.  No new wt since last visit.   Per TOC notes, anticipate d/c to SNF vs home with 24/7 care.  Medications reviewed and include vitamin D3 and cardizem.   Labs reviewed: K: 3.3, Phos: 2.4, CBGS: 95.    Diet Order:   Diet Order             Diet regular Fluid consistency: Thin  Diet effective now                   EDUCATION NEEDS:   Education needs have  been addressed  Skin:  Skin Assessment: Skin Integrity Issues: Skin Integrity Issues:: Wound VAC Wound Vac: back Incisions: Medial back  Last BM:  04/21/23 (type 7)  Height:   Ht Readings from Last 1 Encounters:  04/14/23 5\' 1"  (1.549 m)    Weight:   Wt Readings from Last 1 Encounters:  04/14/23 56.7 kg    Ideal Body Weight:  47.7 kg  BMI:  Body mass index is 23.62 kg/m.  Estimated Nutritional Needs:   Kcal:  1450-1650  Protein:  65-80 grams  Fluid:  > 1.4 L    Levada Schilling, RD, LDN, CDCES Registered Dietitian II Certified Diabetes Care and Education Specialist Please refer to [facility] for RD and/or RD on-call/weekend/after hours pager

## 2023-04-26 DIAGNOSIS — T8130XA Disruption of wound, unspecified, initial encounter: Secondary | ICD-10-CM | POA: Diagnosis not present

## 2023-04-26 DIAGNOSIS — Z7189 Other specified counseling: Secondary | ICD-10-CM | POA: Diagnosis not present

## 2023-04-26 LAB — CBC WITH DIFFERENTIAL/PLATELET
Abs Immature Granulocytes: 0.04 10*3/uL (ref 0.00–0.07)
Basophils Absolute: 0.1 10*3/uL (ref 0.0–0.1)
Basophils Relative: 1 %
Eosinophils Absolute: 0.2 10*3/uL (ref 0.0–0.5)
Eosinophils Relative: 3 %
HCT: 36.8 % (ref 36.0–46.0)
Hemoglobin: 12 g/dL (ref 12.0–15.0)
Immature Granulocytes: 1 %
Lymphocytes Relative: 21 %
Lymphs Abs: 1.3 10*3/uL (ref 0.7–4.0)
MCH: 30.9 pg (ref 26.0–34.0)
MCHC: 32.6 g/dL (ref 30.0–36.0)
MCV: 94.8 fL (ref 80.0–100.0)
Monocytes Absolute: 0.6 10*3/uL (ref 0.1–1.0)
Monocytes Relative: 10 %
Neutro Abs: 4 10*3/uL (ref 1.7–7.7)
Neutrophils Relative %: 64 %
Platelets: 254 10*3/uL (ref 150–400)
RBC: 3.88 MIL/uL (ref 3.87–5.11)
RDW: 16.7 % — ABNORMAL HIGH (ref 11.5–15.5)
WBC: 6.1 10*3/uL (ref 4.0–10.5)
nRBC: 0 % (ref 0.0–0.2)

## 2023-04-26 LAB — BASIC METABOLIC PANEL
Anion gap: 9 (ref 5–15)
BUN: 14 mg/dL (ref 8–23)
CO2: 22 mmol/L (ref 22–32)
Calcium: 9.2 mg/dL (ref 8.9–10.3)
Chloride: 108 mmol/L (ref 98–111)
Creatinine, Ser: 0.68 mg/dL (ref 0.44–1.00)
GFR, Estimated: >60 mL/min (ref >60)
Glucose, Bld: 105 mg/dL — ABNORMAL HIGH (ref 70–99)
Potassium: 3.4 mmol/L — ABNORMAL LOW (ref 3.5–5.1)
Sodium: 139 mmol/L (ref 135–145)

## 2023-04-26 NOTE — Consult Note (Signed)
Pharmacy Antibiotic Note  Sheryl Michael is a 89 y.o. female admitted on 04/14/2023 with  open wound .  Pharmacy has been consulted for vancomycin dosing. T8-L2 posterior spinal fusion with T8-9 and L1-2 cement augmentation done on March 21, 2023.   Today, 04/26/2023 Day #12 vancomycin and cefepime Renal: SCr WNL WBC WNL Afebrile 6/28 superficial swab culture with P aeruginosa and MRSA 6/30 OR cx (I&D): No growth  Vancomycin levels: 7/6 patient receiving vancomycin 750mg  IV q24h, previous dose given 7/5 at 11:43 Vancomycin trough = 12 mcg/ml on 7/6 at 1125   Plan: Continue vancomycin 750 mg q24H.  Predicted AUC 536. Goal AUC of 400-600, or vancomycin trough 10-15 mcg/mL for skin/soft tissue infection . Vd 0.72, IBW, Scr 0.8.  Continue cefepime 2g q12H  Per ID will continue IV antibiotics while in hospital expected stop date 7/12 to complete 14 days of therapy Monitor renal function Once able to take PO, anticipated plan for linezolid po and levofloxacin po (has taken in past despite ciprofloxacin reaction - rash)   Height: 5\' 1"  (154.9 cm) Weight: 56.7 kg (125 lb) IBW/kg (Calculated) : 47.8  Temp (24hrs), Avg:98 F (36.7 C), Min:97.6 F (36.4 C), Max:98.4 F (36.9 C)  Recent Labs  Lab 04/19/23 0835 04/20/23 0349 04/22/23 1125 04/23/23 0420 04/25/23 0534  WBC  --   --   --  7.5  --   CREATININE 0.64 0.75 0.69 0.62 0.68  VANCOTROUGH  --   --  12*  --   --      Estimated Creatinine Clearance: 36 mL/min (by C-G formula based on SCr of 0.68 mg/dL).    Allergies  Allergen Reactions   Bactrim [Sulfamethoxazole-Trimethoprim] Hives   Iodinated Contrast Media Anaphylaxis   Codeine Nausea And Vomiting   Erythromycin Ethylsuccinate Other (See Comments)    Unknown    Phenobarbital Other (See Comments)    Feeling funny, nervous   Ciprofloxacin Rash    Prescribed levofloxacin in 2023 and 2024   Penicillins Rash    Antimicrobials this admission: 6/29 cefepime >>  6/29  vancomycin >>   Dose adjustments this admission: None  Microbiology results: 6/28 BCx: NGTD 6/28 Wound Cx: P aeruginosa (pan-susc), MRSA 6/30 OR cx: NGTD   Thank you for allowing pharmacy to be a part of this patient's care.  Barrie Folk, PharmD 04/26/2023 8:34 AM

## 2023-04-26 NOTE — Plan of Care (Signed)
  Problem: Activity: Goal: Risk for activity intolerance will decrease Outcome: Progressing   Problem: Nutrition: Goal: Adequate nutrition will be maintained Outcome: Progressing   Problem: Coping: Goal: Level of anxiety will decrease Outcome: Progressing   Problem: Safety: Goal: Ability to remain free from injury will improve Outcome: Progressing   Problem: Skin Integrity: Goal: Risk for impaired skin integrity will decrease Outcome: Progressing   

## 2023-04-26 NOTE — Progress Notes (Signed)
Civil engineer, contracting [facility]) Hospital Liaison Note  Received request from Ashby Dawes, RN , Transitions of Care Manager, for hospice services at home after discharge.  Spoke with daughter to initiate education related to hospice philosophy, services, and team approach to care.  Daughter verbalized understanding of information given.  DME needs discussed.  Patient has the following equipment in the home: Walker and WC ramp. Patient/family requests the following equipment in the home: Hospital bed, Over the bed table and a Wheelchair The address has been verified and is correct in the chart.  Nicholl Onstott (son) and phone number  [phone] is the family contact to arrange time of equipment delivery.    Please send signed and completed DNR home with the patient/family.  Please provide prescriptions at discharge as needed to ensure ongoing symptom management.   [facility] information and contact numbers given to daughter.  Above information shared with Ashby Dawes, RN, Transitions of Care Manager.     Please call with any Hospice related questions or concerns.  Thank you for the opportunity to participate in this patient's care.  Redge Gainer, [facility] Liaison [phone]

## 2023-04-26 NOTE — Care Management Important Message (Signed)
Important Message  Patient Details  Name: Sheryl Michael MRN: [id number] Date of Birth: 1931-07-27   Medicare Important Message Given:  Other (see comment)  Patient is discharging home with hospice so out respect for the patient and family no Important Message from [facility] given.   [name] A Shalinda Burkholder 04/26/2023, 2:01 PM

## 2023-04-26 NOTE — Progress Notes (Signed)
Daily Progress Note   Patient Name: Sheryl Michael       Date: 04/26/2023 DOB: [birth year]  Age: 89 y.o. MRN#: [id number] Attending Physician: Tresa Moore, MD Primary Care Physician: Marguarite Arbour, MD Admit Date: 04/14/2023  Reason for Consultation/Follow-up: Establishing goals of care  Subjective: Notes and labs reviewed.  In to see patient.  She is sitting in bedside chair.  Her breakfast tray is in front of her with only a couple of bites missing.  She does not remember talking to her family last night.  She states she is not hungry.  She denies pain and drifts off to sleep.  Called to speak with daughter.  Daughter discusses plans for getting patient home with hospice to follow their.  She discusses hiring a private duty company to help provide 24-hour care for her mother.  PMT will shadow for needs.  Length of Stay: 10  Current Medications: Scheduled Meds:   anastrozole  1 mg Oral Daily   atorvastatin  40 mg Oral Daily   cholecalciferol  500 Units Oral Daily   diltiazem  180 mg Oral Daily   docusate sodium  100 mg Oral BID   escitalopram  10 mg Oral Daily   feeding supplement  237 mL Oral BID BM   levothyroxine  50 mcg Oral Q0600   memantine  5 mg Oral BID   multivitamin with minerals  1 tablet Oral Daily   pantoprazole  40 mg Oral Daily   Rivaroxaban  15 mg Oral Daily   sodium chloride flush  3 mL Intravenous Q12H    Continuous Infusions:  ceFEPime (MAXIPIME) IV 2 g (04/26/23 1015)   vancomycin 750 mg (04/25/23 1245)    PRN Meds: acetaminophen **OR** acetaminophen, albuterol, ondansetron, oxyCODONE, polyethylene glycol  Physical Exam Pulmonary:     Effort: Pulmonary effort is normal.  Neurological:     Mental Status: She is alert.             Vital  Signs: BP 125/64   Pulse 85   Temp 98.4 F (36.9 C)   Resp 16   Ht 5\' 1"  (1.549 m)   Wt 56.7 kg   SpO2 100%   BMI 23.62 kg/m  SpO2: SpO2: 100 % O2 Device: O2 Device: Room Air O2 Flow Rate:  Intake/output summary:  Intake/Output Summary (Last 24 hours) at 04/26/2023 1140 Last data filed at 04/26/2023 1038 Gross per 24 hour  Intake 180 ml  Output 1250 ml  Net -1070 ml   LBM: Last BM Date : 04/24/23 Baseline Weight: Weight: 56.7 kg Most recent weight: Weight: 56.7 kg    Patient Active Problem List   Diagnosis Date Noted   Malnutrition of moderate degree 04/17/2023   Wound dehiscence 04/14/2023   Visit for wound check 04/14/2023   Dementia with behavioral disturbance (HCC) 04/14/2023   COVID-19 virus infection 03/30/2023   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   Kyphosis 03/19/2023   Closed tricolumnar fracture of thoracic vertebra (HCC) 03/19/2023   Thoracic spine fracture (HCC) 03/18/2023   Inadequate pain control 03/17/2023   Closed T11 fracture (HCC) 03/17/2023   Depression 03/17/2023   Genetic testing 05/04/2021   History of breast cancer 08/27/2020   Family history of breast cancer    Carcinoma of lower-outer quadrant of left breast in female, estrogen receptor positive (HCC) 07/23/2020   Recurrent UTI 05/05/2020   Acute pyelonephritis 05/05/2020   Acute kidney injury superimposed on chronic kidney disease (HCC) 05/05/2020   History of recurrent UTIs 03/07/2020   Sepsis (HCC) 03/05/2020   Strep throat 12/19/2018   Allergic reaction 12/19/2018   Sprain of interphalangeal joint of right ring finger 07/30/2018   Traumatic complete tear of left rotator cuff 07/16/2018   Strain of left hip 07/16/2018   Encounter for Hemoccult screening 03/12/2018   Hypothyroidism, acquired 07/10/2017   Arrhythmia 05/31/2017   Atrial fibrillation with RVR (HCC) 05/30/2017   Dyspnea 05/30/2017   Rotator cuff tendinitis, right 12/05/2016   Shingles 12/29/2015   S/P VH (vaginal  hysterectomy) 12/22/2015   Cystocele 12/21/2015   Colon polyp 10/07/2015   Bladder cystocele 10/07/2015   Lower esophageal ring 10/07/2015   H/O neoplasm 08/17/2015   History of nonmelanoma skin cancer 08/17/2015   Malignant neoplasm of breast (HCC) 04/01/2014   GERD (gastroesophageal reflux disease) 04/01/2014   HLD (hyperlipidemia) 04/01/2014   OP (osteoporosis) 04/01/2014   Temporary cerebral vascular dysfunction 04/01/2014   H/O gastrointestinal disease 02/28/2014    Palliative Care Assessment & Plan    Recommendations/Plan: Patient discharging home with hospice to follow.  Daughter is hiring a Engineering geologist company to help provide 24-hour care. As goals are set PMT will shadow  Code Status:    Code Status Orders  (From admission, onward)           Start     Ordered   04/15/23 0931  Do not attempt resuscitation (DNR)  Continuous       Question Answer Comment  If patient has no pulse and is not breathing Do Not Attempt Resuscitation   If patient has a pulse and/or is breathing: Medical Treatment Goals LIMITED ADDITIONAL INTERVENTIONS: Use medication/IV fluids and cardiac monitoring as indicated; Do not use intubation or mechanical ventilation (DNI), also provide comfort medications.  Transfer to Progressive/Stepdown as indicated, avoid Intensive Care.   Consent: Discussion documented in EHR or advanced directives reviewed      04/15/23 0930           Code Status History     Date Active Date Inactive Code Status Order ID Comments User Context   04/14/2023 2332 04/15/2023 0930 Full Code [id number]  Nolberto Hanlon, MD ED   03/17/2023 1638 04/05/2023 2354 Full Code [id number]  Cox, Amy N, DO ED   05/05/2020 0221 05/07/2020 2027 Partial Code [id number]  Mansy, Vernetta Honey, MD ED   03/05/2020 0318 03/08/2020 0018 Partial Code [id number]  Judithe Modest, NP ED   03/05/2020 0224 03/05/2020 0317 Full Code [id number]  Judithe Modest, NP ED   12/19/2018 0432 12/20/2018 1630 Full Code [id number]   Oralia Manis, MD ED   05/30/2017 2032 05/31/2017 1658 Full Code [id number]  Katharina Caper, MD Inpatient   12/21/2015 1132 12/22/2015 1807 Full Code [id number]  Defrancesco, Prentice Docker, MD Inpatient      Advance Directive Documentation    Flowsheet Row Most Recent Value  Type of Advance Directive Living will  Pre-existing out of facility DNR order (yellow form or pink MOST form) --  "MOST" Form in Place? --       Prognosis:  < 6 months    Thank you for allowing the Palliative Medicine Team to assist in the care of this patient.    Morton Stall, NP  Please contact Palliative Medicine Team phone at [phone] for questions and concerns.

## 2023-04-26 NOTE — Progress Notes (Signed)
PROGRESS NOTE    Sheryl Michael  ZOX:[id number] DOB: 1930-11-09 DOA: 04/14/2023 PCP: Marguarite Arbour, MD    Brief Narrative:  89 y.o. female with medical history significant for T8-L2 posterior spinal fusion with T8-9 and L1-2 cement augmentation done on March 21, 2023.  Indication seems to have been increasing angulation of L1 acute fracture that was diagnosed before the procedure at approximately Mar 17 2023.  Patient was subsequently discharged on April 05, 2023.    [facility]  History obtained from daughter.  Daughter details that patient has not been eating or mobilizing well.  Is on fairly high-dose narcotics.   Admitted for wound dehiscence and concern for infection   6/29: Case discussed in detail with neurosurgery Dr. Marcell Barlow.  I agree that patient may benefit from return to OR for wound closure and possible VAC placement.  Wound does not appear obviously infected however healing may be difficult without reexploration wound.   6/30: S/p wound exploration.  Vac placed.  No obv sign of infection 7/1: Attempted to eat more but oral intake still remains suboptimal.  TOC looking for alternative placement options 7/3 : Seen and examined during rounds.  Very flat affect.  Continues to have very poor oral intake. 7/4: Wound culture yielded Pseudomonas and MRSA.  Patient on cefepime and vancomycin. 7/7 : Wound Vac removed 7/10: Care plan discussed with palliative care.  Tentative plan to discharge home with hospice services.  Assessment & Plan:   Principal Problem:   Wound dehiscence Active Problems:   Atrial fibrillation with RVR (HCC)   Visit for wound check   Dementia with behavioral disturbance (HCC)   Malnutrition of moderate degree  Surgical wound dehiscence Patient is 4 weeks post T8 L2 posterior spinal fusion.  Procedure was done on 6/4.   Wound did not appear obviously infected however was dehisced on admission Status post wound  closure on 6/30 Wound culture from 07/04 yields MRSA and Pseudomonas.   Plan: Continue current antibiotic therapy.  Tentative plan to discharge with hospice services.  If that is the disposition we will discontinue antibiotics.  If we are still pursuing aggressive care will transition to oral quinolone and oral Zyvox at time of DC.   Atrial fibrillation with rapid ventricular response Rate control improved.   Continue home diltiazem Xarelto was resumed on 04/23/23       Dementia with behavioral disturbances Continue Namenda and Lexapro Appreciate psychiatry input     Hypothyroidism Continue Synthroid     Moderate malnutrition Moderate Malnutrition related to social / environmental circumstances as evidenced by mild fat depletion, moderate fat depletion, mild muscle depletion, moderate muscle depletion.  Appreciate dietitian input Continue MVI with minerals daily Continue Ensure Enlive po BID, each supplement provides 350 kcal and 20 grams of protein -D/c Juven     Hyperlipidemia Lipitor     GERD PPI     Adult failure to thrive Patient is severely deconditioned and has very poor oral intake She requires assistance with all activities of daily living Continue [facility] referral       DVT prophylaxis: Xarelto Code Status: DNR Family Communication:None today Disposition Plan: Status is: Inpatient Remains inpatient appropriate because: Unsafe discharge plan   Level of care: Telemetry Medical  Consultants:  None  Procedures:  Wound closure  Antimicrobials: Vancomycin Cefepime   Subjective: Seen and examined.  Sitting up in chair.  Opens eyes when name is called.  Minimally communicative.  Objective: Vitals:  04/24/23 2321 04/25/23 0920 04/25/23 2340 04/26/23 0742  BP: (!) 138/44 (!) 145/48 137/89 125/64  Pulse: 62 65 71 85  Resp: 17 14 20 16   Temp: 97.9 F (36.6 C) 98.1 F (36.7 C) 97.6 F (36.4 C) 98.4 F (36.9 C)  TempSrc:      SpO2:  99% 98% 100% 100%  Weight:      Height:        Intake/Output Summary (Last 24 hours) at 04/26/2023 1503 Last data filed at 04/26/2023 1038 Gross per 24 hour  Intake 180 ml  Output 1050 ml  Net -870 ml   Filed Weights   04/14/23 1930  Weight: 56.7 kg    Examination:  General exam: Appears frail, lethargic Respiratory system: Clear to auscultation. Respiratory effort normal. Cardiovascular system: S1-S2 RRR, no murmurs, no pedal edema Gastrointestinal system: Thin, soft, NT/ND, normal bowel sounds Central nervous system: Lethargic.  Oriented to person.  No focal deficits Extremities: Symmetrically decreased power bilaterally Skin: Midline back surgical incision Psychiatry: Judgement and insight appear normal. Mood & affect appropriate.     Data Reviewed: I have personally reviewed following labs and imaging studies  CBC: Recent Labs  Lab 04/23/23 0420 04/26/23 0953  WBC 7.5 6.1  NEUTROABS  --  4.0  HGB 12.0 12.0  HCT 38.7 36.8  MCV 97.7 94.8  PLT 263 254   Basic Metabolic Panel: Recent Labs  Lab 04/20/23 0349 04/22/23 1125 04/23/23 0420 04/25/23 0534 04/26/23 0953  NA 137  --  138  --  139  K 3.8  --  3.3*  --  3.4*  CL 105  --  107  --  108  CO2 21*  --  20*  --  22  GLUCOSE 109*  --  86  --  105*  BUN 21  --  16  --  14  CREATININE 0.75 0.69 0.62 0.68 0.68  CALCIUM 8.8*  --  9.1  --  9.2  MG 2.2  --   --   --   --   PHOS 2.4*  --   --   --   --    GFR: Estimated Creatinine Clearance: 36 mL/min (by C-G formula based on SCr of 0.68 mg/dL). Liver Function Tests: No results for input(s): "AST", "ALT", "ALKPHOS", "BILITOT", "PROT", "ALBUMIN" in the last 168 hours. No results for input(s): "LIPASE", "AMYLASE" in the last 168 hours. No results for input(s): "AMMONIA" in the last 168 hours. Coagulation Profile: No results for input(s): "INR", "PROTIME" in the last 168 hours. Cardiac Enzymes: No results for input(s): "CKTOTAL", "CKMB", "CKMBINDEX",  "TROPONINI" in the last 168 hours. BNP (last 3 results) No results for input(s): "PROBNP" in the last 8760 hours. HbA1C: No results for input(s): "HGBA1C" in the last 72 hours. CBG: No results for input(s): "GLUCAP" in the last 168 hours. Lipid Profile: No results for input(s): "CHOL", "HDL", "LDLCALC", "TRIG", "CHOLHDL", "LDLDIRECT" in the last 72 hours. Thyroid Function Tests: No results for input(s): "TSH", "T4TOTAL", "FREET4", "T3FREE", "THYROIDAB" in the last 72 hours. Anemia Panel: No results for input(s): "VITAMINB12", "FOLATE", "FERRITIN", "TIBC", "IRON", "RETICCTPCT" in the last 72 hours. Sepsis Labs: No results for input(s): "PROCALCITON", "LATICACIDVEN" in the last 168 hours.  No results found for this or any previous visit (from the past 240 hour(s)).       Radiology Studies: No results found.      Scheduled Meds:  anastrozole  1 mg Oral Daily   atorvastatin  40 mg Oral Daily  cholecalciferol  500 Units Oral Daily   diltiazem  180 mg Oral Daily   docusate sodium  100 mg Oral BID   escitalopram  10 mg Oral Daily   feeding supplement  237 mL Oral BID BM   levothyroxine  50 mcg Oral Q0600   memantine  5 mg Oral BID   multivitamin with minerals  1 tablet Oral Daily   pantoprazole  40 mg Oral Daily   Rivaroxaban  15 mg Oral Daily   sodium chloride flush  3 mL Intravenous Q12H   Continuous Infusions:  ceFEPime (MAXIPIME) IV 2 g (04/26/23 1015)   vancomycin 750 mg (04/26/23 1236)     LOS: 10 days     Tresa Moore, MD [facility]   If 7PM-7AM, please contact night-coverage  04/26/2023, 3:03 PM

## 2023-04-26 NOTE — TOC Progression Note (Signed)
Transition of Care [facility]) - Progression Note    Patient Details  Name: Sheryl Michael MRN: [id number] Date of Birth: 28-Apr-1931  [facility]) CM/SW Contact  Marlowe Sax, RN Phone Number: 04/26/2023, 9:15 AM  Clinical Narrative:     Called and spoke with [name] the patient's daughter, she stated that the NP with Palliative and they are going to continue to follow the patient She stated that they are discussing Hospice and going home with Hospice To her house, the patients other children  will be helping as well. Palliative NP will see the patient again today, [name] will come back into town in about a week, Family came to see the patient and facetimed with the patient last evening, they spoke with the patient about her wants and needs and to determine plan, She stated that she will be coming into town and staying with the patient at her house, I mentioned that she will be medically ready prior to a week, I asked what their plan would be she stated that they would get round the clock care set up for home.  I emailed her a list of resources for PCS locally.  We discussed different agencies for Hospice I offered choice and she chose [facility] Debbie requested that Liaison with [facility] to call her.  She still wants Crystal the NP with Palliative to see the patient today and give her a call. I explained that Hospice will also arrange andy DME needed.   Expected Discharge Plan: Skilled Nursing Facility Barriers to Discharge: SNF Pending bed offer, Insurance Authorization  Expected Discharge Plan and Services   Discharge Planning Services: CM Consult   Living arrangements for the past 2 months: Independent Living Facility                   DME Agency: NA       HH Arranged: NA HH Agency: NA         Social Determinants of Health (SDOH) Interventions SDOH Screenings   Food Insecurity: No Food Insecurity (04/15/2023)  Housing: Low Risk  (04/15/2023)   Transportation Needs: No Transportation Needs (04/15/2023)  Utilities: Not At Risk (04/15/2023)  Tobacco Use: Low Risk  (04/20/2023)    Readmission Risk Interventions    04/17/2023    2:11 PM  Readmission Risk Prevention Plan  Transportation Screening Complete  PCP or Specialist Appt within 3-5 Days Complete  HRI or Home Care Consult Complete  Social Work Consult for Recovery Care Planning/Counseling Complete  Palliative Care Screening Not Applicable  Medication Review Oceanographer) Referral to Pharmacy

## 2023-04-27 DIAGNOSIS — I4891 Unspecified atrial fibrillation: Secondary | ICD-10-CM | POA: Diagnosis not present

## 2023-04-27 DIAGNOSIS — T8130XA Disruption of wound, unspecified, initial encounter: Secondary | ICD-10-CM | POA: Diagnosis not present

## 2023-04-27 DIAGNOSIS — D649 Anemia, unspecified: Secondary | ICD-10-CM | POA: Diagnosis not present

## 2023-04-27 DIAGNOSIS — R627 Adult failure to thrive: Secondary | ICD-10-CM | POA: Diagnosis not present

## 2023-04-27 LAB — CREATININE, SERUM
Creatinine, Ser: 0.62 mg/dL (ref 0.44–1.00)
GFR, Estimated: >60 mL/min (ref >60)

## 2023-04-27 NOTE — Progress Notes (Signed)
PT Cancellation Note  Patient Details Name: Sheryl Michael MRN: [id number] DOB: December 01, 1930   Cancelled Treatment:    Reason Eval/Treat Not Completed: Medical issues which prohibited therapy Patient is going home with hospice. Signing off.   Kiela Shisler 04/27/2023, 9:35 AM

## 2023-04-27 NOTE — Progress Notes (Signed)
[facility]- Civil engineer, contracting [facility])  Patient's daughter requested that patient be evaluated today for admission to the Hospice Home.  Patient was not appropriate for admission.  TOC aware.  Patient will return home after DME is delivered between 10 and 1pm tomorrow.    Please don't hesitate to call with any Hospice related questions or concerns.    Thank you for the opportunity to participate in this patient's care. [facility] Liaison [phone]

## 2023-04-27 NOTE — Plan of Care (Signed)
  Problem: Pain Managment: Goal: General experience of comfort will improve Outcome: Progressing   Problem: Safety: Goal: Ability to remain free from injury will improve Outcome: Progressing   Problem: Skin Integrity: Goal: Risk for impaired skin integrity will decrease Outcome: Progressing   

## 2023-04-27 NOTE — TOC Progression Note (Addendum)
Transition of Care [facility]) - Progression Note    Patient Details  Name: Sheryl Michael MRN: [id number] Date of Birth: Sep 16, 1931  Transition of Care [facility]) CM/SW Contact  Marlowe Sax, RN Phone Number: 04/27/2023, 3:22 PM  Clinical Narrative:    Hospice evaluated if patient was appropriate for the Hospice facility, they determined that she is not yet appropriate for the Inpatient hospice facility, [facility] has arranged a hospital bed and other DME equipment and it will be delivered to the home on Friday. [facility] was set up by the family and they are ready to Start care for 24/7 care, once the patient is discharged home, Patient will transport via EMS Plan to DC after DME is delivered to the home tomorrow Spoke with daughter [name], we reviewed the DC plan, Tomorrow the DME will be delivered between 10-1 and then the patient will DC with EMS to home,   Expected Discharge Plan: Skilled Nursing Facility Barriers to Discharge: SNF Pending bed offer, Insurance Authorization  Expected Discharge Plan and Services   Discharge Planning Services: CM Consult   Living arrangements for the past 2 months: Independent Living Facility                   DME Agency: NA       HH Arranged: NA HH Agency: NA         Social Determinants of Health (SDOH) Interventions SDOH Screenings   Food Insecurity: No Food Insecurity (04/15/2023)  Housing: Low Risk  (04/15/2023)  Transportation Needs: No Transportation Needs (04/15/2023)  Utilities: Not At Risk (04/15/2023)  Tobacco Use: Low Risk  (04/20/2023)    Readmission Risk Interventions    04/17/2023    2:11 PM  Readmission Risk Prevention Plan  Transportation Screening Complete  PCP or Specialist Appt within 3-5 Days Complete  HRI or Home Care Consult Complete  Social Work Consult for Recovery Care Planning/Counseling Complete  Palliative Care Screening Not Applicable  Medication Review Oceanographer) Referral to Pharmacy

## 2023-04-27 NOTE — Progress Notes (Signed)
Date of Admission:  04/14/2023     ID: Sheryl Michael is a 89 y.o. female  Principal Problem:   Wound dehiscence Active Problems:   Atrial fibrillation with RVR (HCC)   Visit for wound check   Dementia with behavioral disturbance (HCC)   Malnutrition of moderate degree    Subjective: Pt is awake No complaints   Medications:   anastrozole  1 mg Oral Daily   atorvastatin  40 mg Oral Daily   cholecalciferol  500 Units Oral Daily   diltiazem  180 mg Oral Daily   docusate sodium  100 mg Oral BID   escitalopram  10 mg Oral Daily   feeding supplement  237 mL Oral BID BM   levothyroxine  50 mcg Oral Q0600   memantine  5 mg Oral BID   multivitamin with minerals  1 tablet Oral Daily   pantoprazole  40 mg Oral Daily   Rivaroxaban  15 mg Oral Daily   sodium chloride flush  3 mL Intravenous Q12H    Objective: Vital signs in last 24 hours: Patient Vitals for the past 24 hrs:  BP Temp Temp src Pulse Resp SpO2 Weight  04/27/23 1713 (!) 122/51 98 F (36.7 C) -- 90 16 95 % --  04/27/23 0838 (!) 153/53 98.4 F (36.9 C) Oral (!) 106 16 98 % --  04/27/23 0500 -- -- -- -- -- -- 58 kg  04/26/23 2203 (!) 131/97 97.9 F (36.6 C) -- 78 18 99 % --     PHYSICAL EXAM:  General:awake , cooperative, very frail,  Lungs: b/l air entry Heag Back- surgical site - sutures intact, clean, no erythema or discharge   Neurologic: Grossly non-focal  Lab Results    Latest Ref Rng & Units 04/26/2023    9:53 AM 04/23/2023    4:20 AM 04/17/2023    9:57 AM  CBC  WBC 4.0 - 10.5 K/uL 6.1  7.5  5.5   Hemoglobin 12.0 - 15.0 g/dL 16.1  09.6  9.6   Hematocrit 36.0 - 46.0 % 36.8  38.7  30.4   Platelets 150 - 400 K/uL 254  263  228        Latest Ref Rng & Units 04/27/2023    6:26 AM 04/26/2023    9:53 AM 04/25/2023    5:34 AM  CMP  Glucose 70 - 99 mg/dL  045    BUN 8 - 23 mg/dL  14    Creatinine 4.09 - 1.00 mg/dL 8.11  9.14  7.82   Sodium 135 - 145 mmol/L  139    Potassium 3.5 - 5.1 mmol/L  3.4     Chloride 98 - 111 mmol/L  108    CO2 22 - 32 mmol/L  22    Calcium 8.9 - 10.3 mg/dL  9.2        Microbiology: 04/16/23 surgical culture No growth 04/14/23 BC - NG 04/14/23 superficial culture MRSA/pseudomonas Studies/Results: No results found.   Assessment/Plan: Superficial wound dehiscence of the thoracic lumbar surgical wound. No deep infection Cultures from the surgical procedure was negative The superficial culture done in the ED was MRSA and Pseudomonas but there were no WBCs seen on the smear and hence this likely was colonization.  But because of recent surgery and to prevent deep infection will treat both the organism for a total of 2 weeks.  She is currently on cefepime and vancomycin until 04/28/23.     History of fall with fracture of  the TL and L1 spine status post fusion surgery   CA breast left status postlumpectomy and radiation.  On anastrozole currently   A-fib on diltiazem ? Anemia   Frailty   ID will sign off- call if needed_ ________________________________

## 2023-04-27 NOTE — Progress Notes (Signed)
PROGRESS NOTE    Sheryl Michael  WUJ:[id number] DOB: October 07, 1931 DOA: 04/14/2023 PCP: Marguarite Arbour, MD    Brief Narrative:  89 y.o. female with medical history significant for T8-L2 posterior spinal fusion with T8-9 and L1-2 cement augmentation done on March 21, 2023.  Indication seems to have been increasing angulation of L1 acute fracture that was diagnosed before the procedure at approximately Mar 17 2023.  Patient was subsequently discharged on April 05, 2023.    [facility]  History obtained from daughter.  Daughter details that patient has not been eating or mobilizing well.  Is on fairly high-dose narcotics.   Admitted for wound dehiscence and concern for infection   6/29: Case discussed in detail with neurosurgery Dr. Marcell Barlow.  I agree that patient may benefit from return to OR for wound closure and possible VAC placement.  Wound does not appear obviously infected however healing may be difficult without reexploration wound.   6/30: S/p wound exploration.  Vac placed.  No obv sign of infection 7/1: Attempted to eat more but oral intake still remains suboptimal.  TOC looking for alternative placement options 7/3 : Seen and examined during rounds.  Very flat affect.  Continues to have very poor oral intake. 7/4: Wound culture yielded Pseudomonas and MRSA.  Patient on cefepime and vancomycin. 7/7 : Wound Vac removed 7/10: Care plan discussed with palliative care.  Tentative plan to discharge home with hospice services.  Assessment & Plan:   Principal Problem:   Wound dehiscence Active Problems:   Atrial fibrillation with RVR (HCC)   Visit for wound check   Dementia with behavioral disturbance (HCC)   Malnutrition of moderate degree  Surgical wound dehiscence Patient is 4 weeks post T8 L2 posterior spinal fusion.  Procedure was done on 6/4.   Wound did not appear obviously infected however was dehisced on admission Status post wound  closure on 6/30 Wound culture from 07/04 yields MRSA and Pseudomonas.   Plan: Continue current antibiotic therapy.  Tentative plan to discharge with hospice services.  If that is the disposition we will discontinue antibiotics.  If we are still pursuing aggressive care will transition to oral quinolone and oral Zyvox at time of DC.   Atrial fibrillation with rapid ventricular response Rate control improved.   Continue home diltiazem Xarelto was resumed on 04/23/23 If patient is discharging home with hospice services we can discontinue anticoagulation and rate control agents       Dementia with behavioral disturbances Continue Namenda and Lexapro Appreciate psychiatry input     Hypothyroidism Continue Synthroid     Moderate malnutrition Moderate Malnutrition related to social / environmental circumstances as evidenced by mild fat depletion, moderate fat depletion, mild muscle depletion, moderate muscle depletion.  Appreciate dietitian input Continue MVI with minerals daily Continue Ensure Enlive po BID, each supplement provides 350 kcal and 20 grams of protein -D/c Juven     Hyperlipidemia Lipitor     GERD PPI     Adult failure to thrive Patient is severely deconditioned and has very poor oral intake She requires assistance with all activities of daily living Continue [facility] referral Tentative plan discharge from hospice services       DVT prophylaxis: Xarelto Code Status: DNR Family Communication:None today Disposition Plan: Status is: Inpatient Remains inpatient appropriate because: Unsafe discharge plan   Level of care: Telemetry Medical  Consultants:  None  Procedures:  Wound closure  Antimicrobials: Vancomycin Cefepime   Subjective:  Seen and examined.  Sitting up in chair.  No visible distress.  Minimally communicative.  Objective: Vitals:   04/26/23 1521 04/26/23 2203 04/27/23 0500 04/27/23 0838  BP: (!) 123/48 (!) 131/97  (!)  153/53  Pulse: 95 78  (!) 106  Resp: 14 18  16   Temp: 98.3 F (36.8 C) 97.9 F (36.6 C)  98.4 F (36.9 C)  TempSrc:    Oral  SpO2: 98% 99%  98%  Weight:   58 kg   Height:        Intake/Output Summary (Last 24 hours) at 04/27/2023 1415 Last data filed at 04/26/2023 2207 Gross per 24 hour  Intake 0 ml  Output 200 ml  Net -200 ml   Filed Weights   04/14/23 1930 04/27/23 0500  Weight: 56.7 kg 58 kg    Examination:  General exam: Appears fatigued and frail Respiratory system: Clear to auscultation. Respiratory effort normal. Cardiovascular system: S1-S2 RRR, no murmurs, no pedal edema Gastrointestinal system: Thin, soft, NT/ND, normal bowel sounds Central nervous system: Lethargic.  Oriented to person.  No focal deficits Extremities: Symmetrically decreased power bilaterally Skin: Midline back surgical incision Psychiatry: Judgement and insight appear normal. Mood & affect appropriate.     Data Reviewed: I have personally reviewed following labs and imaging studies  CBC: Recent Labs  Lab 04/23/23 0420 04/26/23 0953  WBC 7.5 6.1  NEUTROABS  --  4.0  HGB 12.0 12.0  HCT 38.7 36.8  MCV 97.7 94.8  PLT 263 254   Basic Metabolic Panel: Recent Labs  Lab 04/22/23 1125 04/23/23 0420 04/25/23 0534 04/26/23 0953 04/27/23 0626  NA  --  138  --  139  --   K  --  3.3*  --  3.4*  --   CL  --  107  --  108  --   CO2  --  20*  --  22  --   GLUCOSE  --  86  --  105*  --   BUN  --  16  --  14  --   CREATININE 0.69 0.62 0.68 0.68 0.62  CALCIUM  --  9.1  --  9.2  --    GFR: Estimated Creatinine Clearance: 39.1 mL/min (by C-G formula based on SCr of 0.62 mg/dL). Liver Function Tests: No results for input(s): "AST", "ALT", "ALKPHOS", "BILITOT", "PROT", "ALBUMIN" in the last 168 hours. No results for input(s): "LIPASE", "AMYLASE" in the last 168 hours. No results for input(s): "AMMONIA" in the last 168 hours. Coagulation Profile: No results for input(s): "INR", "PROTIME"  in the last 168 hours. Cardiac Enzymes: No results for input(s): "CKTOTAL", "CKMB", "CKMBINDEX", "TROPONINI" in the last 168 hours. BNP (last 3 results) No results for input(s): "PROBNP" in the last 8760 hours. HbA1C: No results for input(s): "HGBA1C" in the last 72 hours. CBG: No results for input(s): "GLUCAP" in the last 168 hours. Lipid Profile: No results for input(s): "CHOL", "HDL", "LDLCALC", "TRIG", "CHOLHDL", "LDLDIRECT" in the last 72 hours. Thyroid Function Tests: No results for input(s): "TSH", "T4TOTAL", "FREET4", "T3FREE", "THYROIDAB" in the last 72 hours. Anemia Panel: No results for input(s): "VITAMINB12", "FOLATE", "FERRITIN", "TIBC", "IRON", "RETICCTPCT" in the last 72 hours. Sepsis Labs: No results for input(s): "PROCALCITON", "LATICACIDVEN" in the last 168 hours.  No results found for this or any previous visit (from the past 240 hour(s)).       Radiology Studies: No results found.      Scheduled Meds:  anastrozole  1 mg Oral  Daily   atorvastatin  40 mg Oral Daily   cholecalciferol  500 Units Oral Daily   diltiazem  180 mg Oral Daily   docusate sodium  100 mg Oral BID   escitalopram  10 mg Oral Daily   feeding supplement  237 mL Oral BID BM   levothyroxine  50 mcg Oral Q0600   memantine  5 mg Oral BID   multivitamin with minerals  1 tablet Oral Daily   pantoprazole  40 mg Oral Daily   Rivaroxaban  15 mg Oral Daily   sodium chloride flush  3 mL Intravenous Q12H   Continuous Infusions:  ceFEPime (MAXIPIME) IV 2 g (04/27/23 1053)   vancomycin 750 mg (04/27/23 1321)     LOS: 11 days     Tresa Moore, MD [facility]   If 7PM-7AM, please contact night-coverage  04/27/2023, 2:15 PM

## 2023-04-28 DIAGNOSIS — T8130XA Disruption of wound, unspecified, initial encounter: Secondary | ICD-10-CM | POA: Diagnosis not present

## 2023-04-28 MED ORDER — OXYCODONE HCL 5 MG PO TABS: 2.5000 mg | ORAL_TABLET | ORAL | 0 refills | Status: DC | PRN

## 2023-04-28 NOTE — TOC Progression Note (Signed)
Transition of Care [facility]) - Progression Note    Patient Details  Name: Sheryl Michael MRN: [id number] Date of Birth: 1930/12/30  Transition of Care [facility]) CM/SW Contact  Marlowe Sax, RN Phone Number: 04/28/2023, 10:23 AM  Clinical Narrative:     Spoke with Duanne Moron, he stated that he will be at his mothers home at 1 PM, [facility] will also be there at 1 PM, Sherrill Raring requested that he be called with DC instructions, I notified the Bedside Nurse, EMS was called and arranged for 1 PM  Expected Discharge Plan: Skilled Nursing Facility Barriers to Discharge: SNF Pending bed offer, Insurance Authorization  Expected Discharge Plan and Services   Discharge Planning Services: CM Consult   Living arrangements for the past 2 months: Independent Living Facility Expected Discharge Date: 04/28/23                 DME Agency: NA       HH Arranged: NA HH Agency: NA         Social Determinants of Health (SDOH) Interventions SDOH Screenings   Food Insecurity: No Food Insecurity (04/15/2023)  Housing: Low Risk  (04/15/2023)  Transportation Needs: No Transportation Needs (04/15/2023)  Utilities: Not At Risk (04/15/2023)  Tobacco Use: Low Risk  (04/20/2023)    Readmission Risk Interventions    04/17/2023    2:11 PM  Readmission Risk Prevention Plan  Transportation Screening Complete  PCP or Specialist Appt within 3-5 Days Complete  HRI or Home Care Consult Complete  Social Work Consult for Recovery Care Planning/Counseling Complete  Palliative Care Screening Not Applicable  Medication Review [facility]) Referral to Pharmacy

## 2023-04-28 NOTE — Discharge Summary (Signed)
Physician Discharge Summary  Sheryl Michael WUJ:[id number] DOB: Jul 02, 1931 DOA: 04/14/2023  PCP: Marguarite Arbour, MD  Admit date: 04/14/2023 Discharge date: 04/28/2023  Admitted From: Home Disposition:  Home with hospice  Recommendations for Outpatient Follow-up:  Per hospice providers   Home Health:NA  Equipment/Devices:None   Discharge Condition:Hospice  CODE STATUS:DNR  Diet recommendation: Comfort  Brief/Interim Summary:  89 y.o. female with medical history significant for T8-L2 posterior spinal fusion with T8-9 and L1-2 cement augmentation done on March 21, 2023.  Indication seems to have been increasing angulation of L1 acute fracture that was diagnosed before the procedure at approximately Mar 17 2023.  Patient was subsequently discharged on April 05, 2023.    [facility]  History obtained from daughter.  Daughter details that patient has not been eating or mobilizing well.  Is on fairly high-dose narcotics.   Admitted for wound dehiscence and concern for infection   6/29: Case discussed in detail with neurosurgery Dr. Marcell Barlow.  I agree that patient may benefit from return to OR for wound closure and possible VAC placement.  Wound does not appear obviously infected however healing may be difficult without reexploration wound.   6/30: S/p wound exploration.  Vac placed.  No obv sign of infection 7/1: Attempted to eat more but oral intake still remains suboptimal.  TOC looking for alternative placement options 7/3 : Seen and examined during rounds.  Very flat affect.  Continues to have very poor oral intake. 7/4: Wound culture yielded Pseudomonas and MRSA.  Patient on cefepime and vancomycin. 7/7 : Wound Vac removed 7/10: Care plan discussed with palliative care.  Tentative plan to discharge home with hospice services. 7/11: Patient discharged home with hospice services    Discharge Diagnoses:  Principal Problem:   Wound  dehiscence Active Problems:   Atrial fibrillation with RVR (HCC)   Visit for wound check   Dementia with behavioral disturbance (HCC)   Malnutrition of moderate degree  Surgical wound dehiscence Patient is 4 weeks post T8 L2 posterior spinal fusion.  Procedure was done on 6/4.   Wound did not appear obviously infected however was dehisced on admission Status post wound closure on 6/30 Wound culture from 07/04 yields MRSA and Pseudomonas.   Plan: DC all abx at time of discharge  Atrial fibrillation with rapid ventricular response Rate control improved.   Continue home diltiazem Xarelto was resumed on 04/23/23 DC anticoagulation at time of discharge home with hospice services   Dementia with behavioral disturbances Discontinue namenda and lexapro at time of discharge     Hypothyroidism DC synthroid     Moderate malnutrition Moderate Malnutrition related to social / environmental circumstances as evidenced by mild fat depletion, moderate fat depletion, mild muscle depletion, moderate muscle depletion.  Appreciate dietitian input Comfort feeds on discharge      Adult failure to thrive Patient is severely deconditioned and has very poor oral intake DC home with hospice appropriate  Discharge Instructions  Discharge Instructions     Diet - low sodium heart healthy   Complete by: As directed    Increase activity slowly   Complete by: As directed    No wound care   Complete by: As directed       Allergies as of 04/28/2023       Reactions   Bactrim [sulfamethoxazole-trimethoprim] Hives   Iodinated Contrast Media Anaphylaxis   Codeine Nausea And Vomiting   Erythromycin Ethylsuccinate Other (See Comments)   Unknown  Phenobarbital Other (See Comments)   Feeling funny, nervous   Ciprofloxacin Rash   Prescribed levofloxacin in 2023 and 2024   Penicillins Rash        Medication List     STOP taking these medications    anastrozole 1 MG tablet Commonly  known as: ARIMIDEX   atorvastatin 40 MG tablet Commonly known as: LIPITOR   Calcium Carb-Cholecalciferol 600-20 MG-MCG Tabs   cyanocobalamin 1000 MCG tablet   diltiazem 180 MG 24 hr capsule Commonly known as: CARDIZEM CD   docusate sodium 100 MG capsule Commonly known as: COLACE   escitalopram 10 MG tablet Commonly known as: LEXAPRO   levothyroxine 50 MCG tablet Commonly known as: SYNTHROID   memantine 5 MG tablet Commonly known as: NAMENDA   metoprolol tartrate 25 MG tablet Commonly known as: LOPRESSOR   omeprazole 20 MG capsule Commonly known as: PRILOSEC   polyethylene glycol 17 g packet Commonly known as: MIRALAX / GLYCOLAX   Rivaroxaban 15 MG Tabs tablet Commonly known as: XARELTO   Vitamin D3 10 MCG (400 UNIT) tablet       TAKE these medications    acetaminophen 325 MG tablet Commonly known as: TYLENOL Take 650 mg by mouth every 6 (six) hours as needed (pain.).   albuterol 108 (90 Base) MCG/ACT inhaler Commonly known as: VENTOLIN HFA Inhale 2 puffs into the lungs every 6 (six) hours as needed for wheezing or shortness of breath.   ondansetron 4 MG tablet Commonly known as: ZOFRAN Take 1 tablet (4 mg total) by mouth every 6 (six) hours as needed for nausea.   oxyCODONE 5 MG immediate release tablet Commonly known as: Oxy IR/ROXICODONE Take 0.5-1 tablets (2.5-5 mg total) by mouth every 4 (four) hours as needed for moderate pain or severe pain. What changed:  medication strength how much to take when to take this reasons to take this   Probiotic Acidophilus Caps Take 1 capsule by mouth in the morning and at bedtime.        Allergies  Allergen Reactions   Bactrim [Sulfamethoxazole-Trimethoprim] Hives   Iodinated Contrast Media Anaphylaxis   Codeine Nausea And Vomiting   Erythromycin Ethylsuccinate Other (See Comments)    Unknown    Phenobarbital Other (See Comments)    Feeling funny, nervous   Ciprofloxacin Rash    Prescribed  levofloxacin in 2023 and 2024   Penicillins Rash    Consultations: Palliative   Procedures/Studies: DG Chest Port 1 View  Result Date: 04/14/2023 CLINICAL DATA:  Possible sepsis EXAM: PORTABLE CHEST 1 VIEW COMPARISON:  04/04/2023 FINDINGS: Cardiac shadow is within normal limits. Aortic calcifications are noted. The lungs are well aerated bilaterally. Old rib fractures are noted bilaterally. Postsurgical changes in the thoracolumbar spine are seen. No acute abnormality is noted. IMPRESSION: No acute abnormality noted. Electronically Signed   By: Alcide Clever M.D.   On: 04/14/2023 20:19   US Venous Img Lower Bilateral (DVT)  Result Date: 04/04/2023 CLINICAL DATA:  Positive D-dimer EXAM: BILATERAL LOWER EXTREMITY VENOUS DOPPLER ULTRASOUND TECHNIQUE: Gray-scale sonography with graded compression, as well as color Doppler and duplex ultrasound were performed to evaluate the lower extremity deep venous systems from the level of the common femoral vein and including the common femoral, femoral, profunda femoral, popliteal and calf veins including the posterior tibial, peroneal and gastrocnemius veins when visible. The superficial great saphenous vein was also interrogated. Spectral Doppler was utilized to evaluate flow at rest and with distal augmentation maneuvers in the common femoral, femoral  and popliteal veins. COMPARISON:  None Available. FINDINGS: RIGHT LOWER EXTREMITY Common Femoral Vein: No evidence of thrombus. Normal compressibility, respiratory phasicity and response to augmentation. Saphenofemoral Junction: No evidence of thrombus. Normal compressibility and flow on color Doppler imaging. Profunda Femoral Vein: No evidence of thrombus. Normal compressibility and flow on color Doppler imaging. Femoral Vein: No evidence of thrombus. Normal compressibility, respiratory phasicity and response to augmentation. Popliteal Vein: No evidence of thrombus. Normal compressibility, respiratory phasicity and  response to augmentation. Calf Veins: No evidence of thrombus. Normal compressibility and flow on color Doppler imaging. Superficial Great Saphenous Vein: No evidence of thrombus. Normal compressibility. Venous Reflux:  None. Other Findings: Atherosclerotic plaque is noted throughout the lower extremity arterial structures. LEFT LOWER EXTREMITY Common Femoral Vein: No evidence of thrombus. Normal compressibility, respiratory phasicity and response to augmentation. Saphenofemoral Junction: No evidence of thrombus. Normal compressibility and flow on color Doppler imaging. Profunda Femoral Vein: No evidence of thrombus. Normal compressibility and flow on color Doppler imaging. Femoral Vein: No evidence of thrombus. Normal compressibility, respiratory phasicity and response to augmentation. Popliteal Vein: No evidence of thrombus. Normal compressibility, respiratory phasicity and response to augmentation. Calf Veins: No evidence of thrombus. Normal compressibility and flow on color Doppler imaging. Superficial Great Saphenous Vein: No evidence of thrombus. Normal compressibility. Venous Reflux:  None. Other Findings: Atherosclerotic plaque is noted throughout the arterial tree. IMPRESSION: No evidence of deep venous thrombosis in either lower extremity. Extensive atherosclerotic plaque is noted in lower extremity arterial tree. Electronically Signed   By: Alcide Clever M.D.   On: 04/04/2023 19:37   NM Pulmonary Perfusion  Result Date: 04/04/2023 CLINICAL DATA:  High probability for PE.  Elevated D-dimer. EXAM: NUCLEAR MEDICINE PERFUSION LUNG SCAN TECHNIQUE: Perfusion images were obtained in multiple projections after intravenous injection of radiopharmaceutical. Ventilation scans intentionally deferred if perfusion scan and chest x-ray adequate for interpretation during COVID 19 epidemic. RADIOPHARMACEUTICALS:  3.96 mCi Tc-29m MAA IV COMPARISON:  Chest x-ray with ribs 04/04/2023 FINDINGS: There are multiple small  peripheral defects bilaterally in perfusion scanning. Evidence for moderate or large segmental defect. IMPRESSION: Low probability for PE. Electronically Signed   By: Darliss Cheney M.D.   On: 04/04/2023 19:03   DG Ribs Unilateral W/Chest Left  Result Date: 04/04/2023 CLINICAL DATA:  Left rib pain EXAM: LEFT RIBS AND CHEST - 3+ VIEW COMPARISON:  Chest x-ray 03/29/2023 FINDINGS: Healed left seventh rib fracture again seen. No acute fractures are identified. There also healed right fifth and sixth rib fractures, unchanged. There is no focal lung infiltrate, pleural effusion or pneumothorax. The cardiomediastinal silhouette is within normal limits. Thoracolumbar fusion hardware is present. IMPRESSION: No evidence of acute rib fracture. Healed bilateral rib fractures. Electronically Signed   By: Darliss Cheney M.D.   On: 04/04/2023 15:34      Subjective:   Discharge Exam: Vitals:   04/27/23 2338 04/28/23 0820  BP: (!) 126/55 (!) 149/45  Pulse: 91 69  Resp: 17 16  Temp: 98 F (36.7 C) 98.1 F (36.7 C)  SpO2: 96% 97%   Vitals:   04/27/23 0838 04/27/23 1713 04/27/23 2338 04/28/23 0820  BP: (!) 153/53 (!) 122/51 (!) 126/55 (!) 149/45  Pulse: (!) 106 90 91 69  Resp: 16 16 17 16   Temp: 98.4 F (36.9 C) 98 F (36.7 C) 98 F (36.7 C) 98.1 F (36.7 C)  TempSrc: Oral  Oral   SpO2: 98% 95% 96% 97%  Weight:      Height:  General: Pt is alert, awake, not in acute distress Cardiovascular: RRR, S1/S2 +, no rubs, no gallops Respiratory: CTA bilaterally, no wheezing, no rhonchi Abdominal: Soft, NT, ND, bowel sounds + Extremities: no edema, no cyanosis    The results of significant diagnostics from this hospitalization (including imaging, microbiology, ancillary and laboratory) are listed below for reference.     Microbiology: No results found for this or any previous visit (from the past 240 hour(s)).   Labs: BNP (last 3 results) No results for input(s): "BNP" in the last 8760  hours. Basic Metabolic Panel: Recent Labs  Lab 04/22/23 1125 04/23/23 0420 04/25/23 0534 04/26/23 0953 04/27/23 0626  NA  --  138  --  139  --   K  --  3.3*  --  3.4*  --   CL  --  107  --  108  --   CO2  --  20*  --  22  --   GLUCOSE  --  86  --  105*  --   BUN  --  16  --  14  --   CREATININE 0.69 0.62 0.68 0.68 0.62  CALCIUM  --  9.1  --  9.2  --    Liver Function Tests: No results for input(s): "AST", "ALT", "ALKPHOS", "BILITOT", "PROT", "ALBUMIN" in the last 168 hours. No results for input(s): "LIPASE", "AMYLASE" in the last 168 hours. No results for input(s): "AMMONIA" in the last 168 hours. CBC: Recent Labs  Lab 04/23/23 0420 04/26/23 0953  WBC 7.5 6.1  NEUTROABS  --  4.0  HGB 12.0 12.0  HCT 38.7 36.8  MCV 97.7 94.8  PLT 263 254   Cardiac Enzymes: No results for input(s): "CKTOTAL", "CKMB", "CKMBINDEX", "TROPONINI" in the last 168 hours. BNP: Invalid input(s): "POCBNP" CBG: No results for input(s): "GLUCAP" in the last 168 hours. D-Dimer No results for input(s): "DDIMER" in the last 72 hours. Hgb A1c No results for input(s): "HGBA1C" in the last 72 hours. Lipid Profile No results for input(s): "CHOL", "HDL", "LDLCALC", "TRIG", "CHOLHDL", "LDLDIRECT" in the last 72 hours. Thyroid function studies No results for input(s): "TSH", "T4TOTAL", "T3FREE", "THYROIDAB" in the last 72 hours.  Invalid input(s): "FREET3" Anemia work up No results for input(s): "VITAMINB12", "FOLATE", "FERRITIN", "TIBC", "IRON", "RETICCTPCT" in the last 72 hours. Urinalysis    Component Value Date/Time   COLORURINE YELLOW (A) 04/14/2023 2127   APPEARANCEUR HAZY (A) 04/14/2023 2127   APPEARANCEUR Clear 07/19/2021 1306   LABSPEC 1.008 04/14/2023 2127   PHURINE 6.0 04/14/2023 2127   GLUCOSEU NEGATIVE 04/14/2023 2127   HGBUR NEGATIVE 04/14/2023 2127   BILIRUBINUR NEGATIVE 04/14/2023 2127   BILIRUBINUR Negative 07/19/2021 1306   KETONESUR NEGATIVE 04/14/2023 2127   PROTEINUR  NEGATIVE 04/14/2023 2127   NITRITE NEGATIVE 04/14/2023 2127   LEUKOCYTESUR NEGATIVE 04/14/2023 2127   Sepsis Labs Recent Labs  Lab 04/23/23 0420 04/26/23 0953  WBC 7.5 6.1   Microbiology No results found for this or any previous visit (from the past 240 hour(s)).   Time coordinating discharge: Over 30 minutes  SIGNED:   Tresa Moore, MD  [facility] 04/28/2023, 9:18 AM Pager   If 7PM-7AM, please contact night-coverage

## 2023-04-28 NOTE — Plan of Care (Signed)
  Problem: Fluid Volume: Goal: Hemodynamic stability will improve Outcome: Progressing   Problem: Respiratory: Goal: Ability to maintain adequate ventilation will improve Outcome: Progressing   Problem: Pain Managment: Goal: General experience of comfort will improve Outcome: Progressing   Problem: Safety: Goal: Ability to remain free from injury will improve Outcome: Progressing   Problem: Skin Integrity: Goal: Risk for impaired skin integrity will decrease Outcome: Progressing

## 2023-04-28 NOTE — Progress Notes (Signed)
[facility] ([facility])    If applicable, please send signed and completed DNR with patient/family upon discharge. Please provide prescriptions at discharge as needed to ensure ongoing symptom management and a transport packet.   [facility] information and contact numbers given to family and above information shared with TOC.    Please call with any questions/concerns.    Thank you for the opportunity to participate in this patient's care   Shania Junious, MSW [facility] Hospital Liaison  [phone] 

## 2023-05-02 ENCOUNTER — Encounter: Payer: Medicare Other | Admitting: Neurosurgery

## 2023-05-18 DEATH — deceased

## 2023-05-23 DIAGNOSIS — M545 Low back pain, unspecified: Secondary | ICD-10-CM

## 2023-05-31 ENCOUNTER — Other Ambulatory Visit: Payer: Self-pay

## 2023-06-01 ENCOUNTER — Encounter: Payer: Self-pay | Admitting: Internal Medicine

## 2023-06-08 ENCOUNTER — Encounter: Payer: Medicare Other | Admitting: Neurosurgery

## 2023-06-23 ENCOUNTER — Inpatient Hospital Stay: Payer: Self-pay

## 2023-06-23 ENCOUNTER — Inpatient Hospital Stay: Payer: Self-pay | Admitting: Internal Medicine

## 2023-06-23 NOTE — Assessment & Plan Note (Deleted)
 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  Not a candidate for adjuvant chemotherapy.  Currently on adjuvant anastrozole . Continue anastrozole  [until JAN 2027].     # SEP 2023- PET scan-whole-body [MRI brain finding-incidental calvarial lesion]-negative for any metastatic disease.  DEC 2023-[Dr.Cintron]Likely benign dystrophic calcifications developing in the lumpectomy site in the lower outer left breast;   No mammographic evidence of malignancy in the bilateral breasts. Await  six-month follow-up diagnostic left breast mammogram.   # OSTEOPOROSIS: 2022-BMD measured at [facility] L1-L3 is 0.795 g/cm2 with a T-score of -3.2.  She declined Reclast; will hold off recast/patient preference.   #A. Fib-on Eliquis /amiodarone -stable  #History of UTIs- on prophylaxis Kelfex/doxycycline -[urology.]  Stable.  # CKD stage III- GFR-50s- stable.   I spoke at length with the patient's daughter, barnie regarding the patient's clinical status/plan of care.  Family agreement.   # DISPOSITION: # cancel reclast  # follow up in 6 months-; MD; labs- cbc/cmp-Dr.B

## 2023-06-23 NOTE — Progress Notes (Deleted)
[facility] CONSULT NOTE  Patient Care Team: Marguarite Arbour, MD as PCP - General (Internal Medicine) Earna Coder, MD as Consulting Physician (Internal Medicine) Carolan Shiver, MD as Consulting Physician (General Surgery) Carmina Miller, MD as Radiation Oncologist (Radiation Oncology)  CHIEF COMPLAINTS/PURPOSE OF CONSULTATION: Breast cancer  #  Oncology History Overview Note  #Right breast cancer [2006; Dr.Choksi]-s/p lumpectomy followed by radiation; ? Endocrine therapy.   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  # JAN 25th, 2022-start anastrozole  #2016 bone density osteoporosis  # A.fib- on eliuiqs [Dr.callwood]; frequent UTIs [[facility]rology-[facility] ]; COVID in dec 2021.   #Genetics: Patient is interested in genetic blood draw discussed with genetic counselor-negative for any deleterious mutations.  # SURVIVORSHIP:   # GENETICS:   DIAGNOSIS: left breast cancer  STAGE:    I     ;  GOALS: cure  CURRENT/MOST RECENT THERAPY : RT    Carcinoma of lower-outer quadrant of left breast in female, estrogen receptor positive (HCC)  07/23/2020 Initial Diagnosis   Carcinoma of lower-outer quadrant of left breast in female, estrogen receptor negative (HCC)    Genetic Testing   Negative genetic testing. No pathogenic variants identified on the [facility] Multi-Cancer Panel +RNA. The report date is 04/24/2021.   The Multi-Cancer Panel + RNA offered by [facility] includes sequencing and/or deletion duplication testing of the following 84 genes: AIP, ALK, APC, ATM, AXIN2,BAP1,  BARD1, BLM, BMPR1A, BRCA1, BRCA2, BRIP1, CASR, CDC73, CDH1, CDK4, CDKN1B, CDKN1C, CDKN2A (p14ARF), CDKN2A (p16INK4a), CEBPA, CHEK2, CTNNA1, DICER1, DIS3L2, EGFR (c.2369C>T, p.Thr790Met variant only), EPCAM (Deletion/duplication testing only), FH, FLCN,  GATA2, GPC3, GREM1 (Promoter region deletion/duplication testing only), HOXB13 (c.251G>A, p.Gly84Glu), HRAS, KIT, MAX, MEN1, MET, MITF (c.952G>A, p.Glu318Lys variant only), MLH1, MSH2, MSH3, MSH6, MUTYH, NBN, NF1, NF2, NTHL1, PALB2, PDGFRA, PHOX2B, PMS2, POLD1, POLE, POT1, PRKAR1A, PTCH1, PTEN, RAD50, RAD51C, RAD51D, RB1, RECQL4, RET, RUNX1, SDHAF2, SDHA (sequence changes only), SDHB, SDHC, SDHD, SMAD4, SMARCA4, SMARCB1, SMARCE1, STK11, SUFU, TERC, TERT, TMEM127, TP53, TSC1, TSC2, VHL, WRN and WT1.    HISTORY OF PRESENTING ILLNESS: Patient is accompanied by her care giver.  Walks independently.  [name] 89 y.o.  female patient with left-sided breast cancer stage I LOBULAR CANCER-ER-positive; PR- NEG; Her-2- POSITIVE; and Hx of stroke- on Xarelto is here for a follow up.   No episode of stroke or falls.  No blood in stools or black or stools.  She continues to take anastrozole.  Denies any joint pains nausea vomiting headaches.  Denies any worsening hot flashes.  Review of Systems  Constitutional:  Negative for chills, diaphoresis, fever, malaise/fatigue and weight loss.  HENT:  Negative for nosebleeds and sore throat.   Eyes:  Negative for double vision.  Respiratory:  Negative for cough, hemoptysis, sputum production, shortness of breath and wheezing.   Cardiovascular:  Negative for chest pain, palpitations, orthopnea and leg swelling.  Gastrointestinal:  Negative for abdominal pain, blood in stool, constipation, diarrhea, heartburn, melena, nausea and vomiting.  Genitourinary:  Negative for dysuria, frequency and urgency.  Musculoskeletal:  Positive for back pain and joint pain.  Skin: Negative.  Negative for itching and rash.  Neurological:  Negative for dizziness, tingling, focal weakness, weakness and headaches.  Endo/Heme/Allergies:  Does not bruise/bleed easily.  Psychiatric/Behavioral:  Negative for depression. The patient is not nervous/anxious and does not have insomnia.  MEDICAL HISTORY:  Past Medical History:  Diagnosis Date  . Arthritis   . Atrial fibrillation (HCC)   . Breast cancer (HCC) 2006   right breast ca with lumpectomy and rad tx, left breast ca 2021lumpectomy and rad tx  . Colon polyp   . Complication of anesthesia    questions about husband who passed in 2012   . Cystocele   . Depression   . Dysrhythmia   . Family history of breast cancer   . Female bladder prolapse   . GERD (gastroesophageal reflux disease)   . Goiter   . Hyperlipemia   . Hypertension   . Hypothyroidism   . Osteoporosis   . Personal history of radiation therapy 2006   right breast ca and left breast 2021  . Pneumonia   . Procidentia of uterus   . Recurrent UTI   . Reflux   . TIA (transient ischemic attack)   . Vaginal atrophy     SURGICAL HISTORY: Past Surgical History:  Procedure Laterality Date  . APPENDECTOMY    . APPLICATION OF INTRAOPERATIVE CT SCAN  03/20/2023   Procedure: APPLICATION OF INTRAOPERATIVE CT SCAN;  Surgeon: Venetia Night, MD;  Location: [facility];  Service: Neurosurgery;;  . BREAST BIOPSY Right 2006   breast cancer  . BREAST BIOPSY Left 07/17/2020   [place] bx, vision marker, IMC with lobular features  . BREAST LUMPECTOMY Right 2006   positive  . BREAST LUMPECTOMY Left 08/03/2020   left breast invasive lobular carcinoma, negative LNs  . BREAST SURGERY Right    lumpectomy  . CATARACT EXTRACTION W/ INTRAOCULAR LENS  IMPLANT, BILATERAL    . COLONOSCOPY    . CYSTOCELE REPAIR N/A 12/21/2015   Procedure: ANTERIOR REPAIR (CYSTOCELE);  Surgeon: Herold Harms, MD;  Location: [facility];  Service: Gynecology;  Laterality: N/A;  . DILATION AND CURETTAGE OF UTERUS    . ELECTROMAGNETIC NAVIGATION BROCHOSCOPY Right 12/13/2018   Procedure: ELECTROMAGNETIC NAVIGATION BRONCHOSCOPY;  Surgeon: Erin Fulling, MD;  Location: [facility];  Service: Cardiopulmonary;  Laterality: Right;  . EYE SURGERY    . LUMBAR WOUND DEBRIDEMENT N/A 04/16/2023    Procedure: LUMBAR WOUND DEBRIDEMENT;  Surgeon: Venetia Night, MD;  Location: [facility];  Service: Neurosurgery;  Laterality: N/A;  thoracolumbar wound irrigation and debridement  . MINOR APPLICATION OF WOUND VAC N/A 04/16/2023   Procedure: APPLICATION OF WOUND VAC;  Surgeon: Venetia Night, MD;  Location: [facility];  Service: Neurosurgery;  Laterality: N/A;  . PARTIAL MASTECTOMY WITH NEEDLE LOCALIZATION AND AXILLARY SENTINEL LYMPH NODE BX Left 08/03/2020   Procedure: PARTIAL MASTECTOMY WITH Radio Frequency tag AND AXILLARY SENTINEL LYMPH NODE BX;  Surgeon: Carolan Shiver, MD;  Location: [facility];  Service: General;  Laterality: Left;  Marland Kitchen VAGINAL HYSTERECTOMY Bilateral 12/21/2015   Procedure: TVH BSO;  Surgeon: Herold Harms, MD;  Location: [facility];  Service: Gynecology;  Laterality: Bilateral;    SOCIAL HISTORY: Social History   Socioeconomic History  . Marital status: Widowed    Spouse name: Not on file  . Number of children: Not on file  . Years of education: Not on file  . Highest education level: Not on file  Occupational History  . Not on file  Tobacco Use  . Smoking status: Never  . Smokeless tobacco: Never  Vaping Use  . Vaping status: Never Used  Substance and Sexual Activity  . Alcohol use: No  . Drug use: No  . Sexual activity: Never    Birth control/protection: Post-menopausal  Other Topics Concern  . Not on file  Social History Narrative  . Not on file   Social Determinants of Health   Financial Resource Strain: Not on file  Food Insecurity: No Food Insecurity (04/15/2023)   Hunger Vital Sign   . Worried About Programme researcher, broadcasting/film/video in the Last Year: Never true   . Ran Out of Food in the Last Year: Never true  Transportation Needs: No Transportation Needs (04/15/2023)   PRAPARE - Transportation   . Lack of Transportation (Medical): No   . Lack of Transportation (Non-Medical): No  Physical Activity: Not on file  Stress: Not on file  Social  Connections: Not on file  Intimate Partner Violence: Not At Risk (04/15/2023)   Humiliation, Afraid, Rape, and Kick questionnaire   . Fear of Current or Ex-Partner: No   . Emotionally Abused: No   . Physically Abused: No   . Sexually Abused: No    FAMILY HISTORY: Family History  Problem Relation Age of Onset  . Diabetes Sister   . Breast cancer Sister        late 74's  . Diabetes Brother   . Diabetes Sister     ALLERGIES:  is allergic to bactrim [sulfamethoxazole-trimethoprim], iodinated contrast media, codeine, erythromycin ethylsuccinate, phenobarbital, ciprofloxacin, and penicillins.  MEDICATIONS:  Current Outpatient Medications  Medication Sig Dispense Refill  . acetaminophen (TYLENOL) 325 MG tablet Take 650 mg by mouth every 6 (six) hours as needed (pain.).     Marland Kitchen albuterol (VENTOLIN HFA) 108 (90 Base) MCG/ACT inhaler Inhale 2 puffs into the lungs every 6 (six) hours as needed for wheezing or shortness of breath. 8 g 2  . Lactobacillus (PROBIOTIC ACIDOPHILUS) CAPS Take 1 capsule by mouth in the morning and at bedtime.    . ondansetron (ZOFRAN) 4 MG tablet Take 1 tablet (4 mg total) by mouth every 6 (six) hours as needed for nausea. 20 tablet 0  . oxyCODONE (OXY IR/ROXICODONE) 5 MG immediate release tablet Take 0.5-1 tablets (2.5-5 mg total) by mouth every 4 (four) hours as needed for moderate pain or severe pain. 30 tablet 0   No current facility-administered medications for this visit.      Marland Kitchen  PHYSICAL EXAMINATION: ECOG PERFORMANCE STATUS: 0 - Asymptomatic  There were no vitals filed for this visit.  There were no vitals filed for this visit.   Physical Exam HENT:     Head: Normocephalic and atraumatic.     Mouth/Throat:     Pharynx: No oropharyngeal exudate.  Eyes:     Pupils: Pupils are equal, round, and reactive to light.  Cardiovascular:     Rate and Rhythm: Normal rate and regular rhythm.  Pulmonary:     Effort: Pulmonary effort is normal. No respiratory  distress.     Breath sounds: No wheezing.  Abdominal:     General: Bowel sounds are normal. There is no distension.     Palpations: Abdomen is soft. There is no mass.     Tenderness: There is no abdominal tenderness. There is no guarding or rebound.  Musculoskeletal:        General: No tenderness. Normal range of motion.     Cervical back: Normal range of motion and neck supple.  Skin:    General: Skin is warm.  Neurological:     Mental Status: She is alert and oriented to person, place, and time.  Psychiatric:        Mood and Affect: Affect normal.  LABORATORY DATA:  I have reviewed the data as listed Lab Results  Component Value Date   WBC 6.1 04/26/2023   HGB 12.0 04/26/2023   HCT 36.8 04/26/2023   MCV 94.8 04/26/2023   PLT 254 04/26/2023   Recent Labs    03/31/23 0731 04/02/23 0619 04/03/23 0437 04/14/23 1959 04/15/23 0538 04/20/23 0349 04/22/23 1125 04/23/23 0420 04/25/23 0534 04/26/23 0953 04/27/23 0626  NA 140 137   < > 133*   < > 137  --  138  --  139  --   K 3.8 3.3*   < > 4.4   < > 3.8  --  3.3*  --  3.4*  --   CL 102 104   < > 101   < > 105  --  107  --  108  --   CO2 26 24   < > 19*   < > 21*  --  20*  --  22  --   GLUCOSE 76 89   < > 141*   < > 109*  --  86  --  105*  --   BUN 14 18   < > 17   < > 21  --  16  --  14  --   CREATININE 0.72 0.86   < > 1.00   < > 0.75   < > 0.62 0.68 0.68 0.62  CALCIUM 8.3* 8.1*   < > 8.6*   < > 8.8*  --  9.1  --  9.2  --   GFRNONAA >60 >60   < > 54*   < > >60   < > >60 >60 >60 >60  PROT 6.3* 5.6*  --  6.8  --   --   --   --   --   --   --   ALBUMIN 2.3* 2.2*  --  2.7*  --   --   --   --   --   --   --   AST 81* 34  --  33  --   --   --   --   --   --   --   ALT 68* 35  --  26  --   --   --   --   --   --   --   ALKPHOS 113 105  --  99  --   --   --   --   --   --   --   BILITOT 0.4 0.5  --  1.0  --   --   --   --   --   --   --    < > = values in this interval not displayed.    RADIOGRAPHIC STUDIES: I have  personally reviewed the radiological images as listed and agreed with the findings in the report. No results found.  ASSESSMENT & PLAN:   No problem-specific Assessment & Plan notes found for this encounter.   All questions were answered. The patient/family knows to call the clinic with any problems, questions or concerns.    Earna Coder, MD 06/23/2023 1:12 PM
